# Patient Record
Sex: Male | Born: 1955 | Race: White | Hispanic: No | Marital: Single | Smoking: Current every day smoker
Health system: Southern US, Community
[De-identification: ages and names within clinical notes are randomized; demographics above are authoritative.]

## PROBLEM LIST (undated history)

## (undated) ENCOUNTER — Emergency Department (HOSPITAL_COMMUNITY): Admission: EM | Discharge status: Against Medical Advice | Payer: Medicare Other

## (undated) ENCOUNTER — Emergency Department (HOSPITAL_COMMUNITY): Admission: EM | Payer: Medicare Other | Source: Home / Self Care

## (undated) DIAGNOSIS — R569 Unspecified convulsions: Secondary | ICD-10-CM

## (undated) DIAGNOSIS — I2510 Atherosclerotic heart disease of native coronary artery without angina pectoris: Secondary | ICD-10-CM

## (undated) DIAGNOSIS — F419 Anxiety disorder, unspecified: Secondary | ICD-10-CM

## (undated) DIAGNOSIS — F411 Generalized anxiety disorder: Secondary | ICD-10-CM

## (undated) DIAGNOSIS — G8929 Other chronic pain: Secondary | ICD-10-CM

## (undated) DIAGNOSIS — F6381 Intermittent explosive disorder: Secondary | ICD-10-CM

## (undated) DIAGNOSIS — E079 Disorder of thyroid, unspecified: Secondary | ICD-10-CM

## (undated) DIAGNOSIS — K279 Peptic ulcer, site unspecified, unspecified as acute or chronic, without hemorrhage or perforation: Secondary | ICD-10-CM

## (undated) DIAGNOSIS — J449 Chronic obstructive pulmonary disease, unspecified: Secondary | ICD-10-CM

## (undated) DIAGNOSIS — R079 Chest pain, unspecified: Secondary | ICD-10-CM

## (undated) DIAGNOSIS — C801 Malignant (primary) neoplasm, unspecified: Secondary | ICD-10-CM

## (undated) DIAGNOSIS — N401 Enlarged prostate with lower urinary tract symptoms: Secondary | ICD-10-CM

## (undated) DIAGNOSIS — F99 Mental disorder, not otherwise specified: Secondary | ICD-10-CM

## (undated) DIAGNOSIS — H5440 Blindness, one eye, unspecified eye: Secondary | ICD-10-CM

## (undated) DIAGNOSIS — I10 Essential (primary) hypertension: Secondary | ICD-10-CM

## (undated) DIAGNOSIS — J9610 Chronic respiratory failure, unspecified whether with hypoxia or hypercapnia: Secondary | ICD-10-CM

## (undated) DIAGNOSIS — N138 Other obstructive and reflux uropathy: Secondary | ICD-10-CM

## (undated) DIAGNOSIS — F0630 Mood disorder due to known physiological condition, unspecified: Secondary | ICD-10-CM

## (undated) DIAGNOSIS — I5030 Unspecified diastolic (congestive) heart failure: Secondary | ICD-10-CM

## (undated) DIAGNOSIS — I219 Acute myocardial infarction, unspecified: Secondary | ICD-10-CM

## (undated) DIAGNOSIS — R69 Illness, unspecified: Secondary | ICD-10-CM

## (undated) DIAGNOSIS — F70 Mild intellectual disabilities: Secondary | ICD-10-CM

## (undated) HISTORY — PX: FRACTURE SURGERY: SHX138

---

## 1998-01-19 ENCOUNTER — Emergency Department (HOSPITAL_COMMUNITY): Admission: EM | Admit: 1998-01-19 | Discharge: 1998-01-19 | Payer: Self-pay | Admitting: Emergency Medicine

## 1998-02-11 ENCOUNTER — Emergency Department (HOSPITAL_COMMUNITY): Admission: EM | Admit: 1998-02-11 | Discharge: 1998-02-11 | Payer: Self-pay | Admitting: Emergency Medicine

## 1998-03-19 ENCOUNTER — Inpatient Hospital Stay (HOSPITAL_COMMUNITY): Admission: AD | Admit: 1998-03-19 | Discharge: 1998-03-22 | Payer: Self-pay | Admitting: *Deleted

## 1998-05-17 ENCOUNTER — Emergency Department (HOSPITAL_COMMUNITY): Admission: EM | Admit: 1998-05-17 | Discharge: 1998-05-17 | Payer: Self-pay | Admitting: Emergency Medicine

## 1998-05-25 ENCOUNTER — Emergency Department (HOSPITAL_COMMUNITY): Admission: EM | Admit: 1998-05-25 | Discharge: 1998-05-25 | Payer: Self-pay | Admitting: Emergency Medicine

## 1998-06-13 ENCOUNTER — Ambulatory Visit: Admission: RE | Admit: 1998-06-13 | Discharge: 1998-06-13 | Payer: Self-pay | Admitting: Internal Medicine

## 1998-06-15 ENCOUNTER — Emergency Department (HOSPITAL_COMMUNITY): Admission: EM | Admit: 1998-06-15 | Discharge: 1998-06-15 | Payer: Self-pay | Admitting: Emergency Medicine

## 1998-06-19 ENCOUNTER — Encounter: Payer: Self-pay | Admitting: *Deleted

## 1998-06-20 ENCOUNTER — Inpatient Hospital Stay (HOSPITAL_COMMUNITY): Admission: EM | Admit: 1998-06-20 | Discharge: 1998-06-21 | Payer: Self-pay | Admitting: *Deleted

## 1998-07-09 ENCOUNTER — Encounter: Payer: Self-pay | Admitting: Emergency Medicine

## 1998-07-09 ENCOUNTER — Emergency Department (HOSPITAL_COMMUNITY): Admission: EM | Admit: 1998-07-09 | Discharge: 1998-07-09 | Payer: Self-pay | Admitting: Emergency Medicine

## 1998-08-12 ENCOUNTER — Emergency Department (HOSPITAL_COMMUNITY): Admission: EM | Admit: 1998-08-12 | Discharge: 1998-08-12 | Payer: Self-pay | Admitting: Emergency Medicine

## 1998-08-17 ENCOUNTER — Emergency Department (HOSPITAL_COMMUNITY): Admission: EM | Admit: 1998-08-17 | Discharge: 1998-08-17 | Payer: Self-pay | Admitting: Emergency Medicine

## 1998-08-22 ENCOUNTER — Emergency Department (HOSPITAL_COMMUNITY): Admission: EM | Admit: 1998-08-22 | Discharge: 1998-08-22 | Payer: Self-pay | Admitting: Emergency Medicine

## 1998-08-22 ENCOUNTER — Encounter: Payer: Self-pay | Admitting: Emergency Medicine

## 1998-08-28 ENCOUNTER — Emergency Department (HOSPITAL_COMMUNITY): Admission: EM | Admit: 1998-08-28 | Discharge: 1998-08-28 | Payer: Self-pay | Admitting: Emergency Medicine

## 1998-10-05 ENCOUNTER — Emergency Department (HOSPITAL_COMMUNITY): Admission: EM | Admit: 1998-10-05 | Discharge: 1998-10-05 | Payer: Self-pay | Admitting: Emergency Medicine

## 1998-11-15 ENCOUNTER — Emergency Department (HOSPITAL_COMMUNITY): Admission: EM | Admit: 1998-11-15 | Discharge: 1998-11-15 | Payer: Self-pay | Admitting: Emergency Medicine

## 1998-11-15 ENCOUNTER — Encounter: Payer: Self-pay | Admitting: Emergency Medicine

## 1998-12-31 ENCOUNTER — Emergency Department (HOSPITAL_COMMUNITY): Admission: EM | Admit: 1998-12-31 | Discharge: 1998-12-31 | Payer: Self-pay | Admitting: Emergency Medicine

## 1999-01-08 ENCOUNTER — Emergency Department (HOSPITAL_COMMUNITY): Admission: EM | Admit: 1999-01-08 | Discharge: 1999-01-08 | Payer: Self-pay | Admitting: Emergency Medicine

## 1999-01-09 ENCOUNTER — Emergency Department (HOSPITAL_COMMUNITY): Admission: EM | Admit: 1999-01-09 | Discharge: 1999-01-09 | Payer: Self-pay | Admitting: Emergency Medicine

## 1999-02-09 ENCOUNTER — Inpatient Hospital Stay (HOSPITAL_COMMUNITY): Admission: EM | Admit: 1999-02-09 | Discharge: 1999-02-10 | Payer: Self-pay | Admitting: Emergency Medicine

## 1999-02-09 ENCOUNTER — Encounter: Payer: Self-pay | Admitting: Emergency Medicine

## 1999-02-10 ENCOUNTER — Encounter: Payer: Self-pay | Admitting: Internal Medicine

## 1999-04-22 ENCOUNTER — Emergency Department (HOSPITAL_COMMUNITY): Admission: EM | Admit: 1999-04-22 | Discharge: 1999-04-22 | Payer: Self-pay | Admitting: Emergency Medicine

## 1999-06-17 ENCOUNTER — Encounter: Payer: Self-pay | Admitting: Emergency Medicine

## 1999-06-17 ENCOUNTER — Emergency Department (HOSPITAL_COMMUNITY): Admission: EM | Admit: 1999-06-17 | Discharge: 1999-06-17 | Payer: Self-pay | Admitting: Emergency Medicine

## 1999-07-26 ENCOUNTER — Emergency Department (HOSPITAL_COMMUNITY): Admission: EM | Admit: 1999-07-26 | Discharge: 1999-07-26 | Payer: Self-pay | Admitting: Emergency Medicine

## 1999-09-11 ENCOUNTER — Encounter: Payer: Self-pay | Admitting: Emergency Medicine

## 1999-09-11 ENCOUNTER — Emergency Department (HOSPITAL_COMMUNITY): Admission: EM | Admit: 1999-09-11 | Discharge: 1999-09-11 | Payer: Self-pay | Admitting: Emergency Medicine

## 1999-10-09 ENCOUNTER — Emergency Department (HOSPITAL_COMMUNITY): Admission: EM | Admit: 1999-10-09 | Discharge: 1999-10-09 | Payer: Self-pay | Admitting: Emergency Medicine

## 1999-11-02 ENCOUNTER — Emergency Department (HOSPITAL_COMMUNITY): Admission: EM | Admit: 1999-11-02 | Discharge: 1999-11-02 | Payer: Self-pay

## 2000-01-14 ENCOUNTER — Emergency Department (HOSPITAL_COMMUNITY): Admission: EM | Admit: 2000-01-14 | Discharge: 2000-01-14 | Payer: Self-pay | Admitting: Emergency Medicine

## 2000-01-14 ENCOUNTER — Encounter: Payer: Self-pay | Admitting: Emergency Medicine

## 2000-03-14 ENCOUNTER — Emergency Department (HOSPITAL_COMMUNITY): Admission: EM | Admit: 2000-03-14 | Discharge: 2000-03-15 | Payer: Self-pay | Admitting: Emergency Medicine

## 2000-05-04 ENCOUNTER — Emergency Department (HOSPITAL_COMMUNITY): Admission: EM | Admit: 2000-05-04 | Discharge: 2000-05-04 | Payer: Self-pay | Admitting: Emergency Medicine

## 2000-05-04 ENCOUNTER — Encounter: Payer: Self-pay | Admitting: Emergency Medicine

## 2000-05-21 ENCOUNTER — Encounter: Payer: Self-pay | Admitting: Emergency Medicine

## 2000-05-21 ENCOUNTER — Emergency Department (HOSPITAL_COMMUNITY): Admission: EM | Admit: 2000-05-21 | Discharge: 2000-05-21 | Payer: Self-pay | Admitting: Emergency Medicine

## 2000-06-27 ENCOUNTER — Emergency Department (HOSPITAL_COMMUNITY): Admission: EM | Admit: 2000-06-27 | Discharge: 2000-06-27 | Payer: Self-pay | Admitting: Emergency Medicine

## 2000-07-06 ENCOUNTER — Emergency Department (HOSPITAL_COMMUNITY): Admission: EM | Admit: 2000-07-06 | Discharge: 2000-07-06 | Payer: Self-pay | Admitting: Emergency Medicine

## 2000-07-14 ENCOUNTER — Emergency Department (HOSPITAL_COMMUNITY): Admission: EM | Admit: 2000-07-14 | Discharge: 2000-07-14 | Payer: Self-pay | Admitting: Emergency Medicine

## 2000-08-11 ENCOUNTER — Emergency Department (HOSPITAL_COMMUNITY): Admission: EM | Admit: 2000-08-11 | Discharge: 2000-08-11 | Payer: Self-pay

## 2000-08-26 ENCOUNTER — Encounter: Admission: RE | Admit: 2000-08-26 | Discharge: 2000-11-24 | Payer: Self-pay | Admitting: Neurology

## 2000-09-08 ENCOUNTER — Emergency Department (HOSPITAL_COMMUNITY): Admission: EM | Admit: 2000-09-08 | Discharge: 2000-09-08 | Payer: Self-pay

## 2000-10-16 ENCOUNTER — Ambulatory Visit (HOSPITAL_COMMUNITY): Admission: RE | Admit: 2000-10-16 | Discharge: 2000-10-16 | Payer: Self-pay | Admitting: Gastroenterology

## 2000-10-23 ENCOUNTER — Emergency Department (HOSPITAL_COMMUNITY): Admission: EM | Admit: 2000-10-23 | Discharge: 2000-10-23 | Payer: Self-pay

## 2000-11-21 ENCOUNTER — Encounter: Payer: Self-pay | Admitting: Emergency Medicine

## 2000-11-21 ENCOUNTER — Emergency Department (HOSPITAL_COMMUNITY): Admission: EM | Admit: 2000-11-21 | Discharge: 2000-11-21 | Payer: Self-pay | Admitting: Emergency Medicine

## 2000-12-09 ENCOUNTER — Emergency Department (HOSPITAL_COMMUNITY): Admission: EM | Admit: 2000-12-09 | Discharge: 2000-12-09 | Payer: Self-pay

## 2000-12-29 ENCOUNTER — Emergency Department (HOSPITAL_COMMUNITY): Admission: EM | Admit: 2000-12-29 | Discharge: 2000-12-29 | Payer: Self-pay | Admitting: Emergency Medicine

## 2001-03-04 ENCOUNTER — Emergency Department (HOSPITAL_COMMUNITY): Admission: EM | Admit: 2001-03-04 | Discharge: 2001-03-04 | Payer: Self-pay | Admitting: Emergency Medicine

## 2001-03-15 ENCOUNTER — Encounter: Payer: Self-pay | Admitting: Emergency Medicine

## 2001-03-15 ENCOUNTER — Emergency Department (HOSPITAL_COMMUNITY): Admission: EM | Admit: 2001-03-15 | Discharge: 2001-03-15 | Payer: Self-pay | Admitting: Emergency Medicine

## 2001-04-11 ENCOUNTER — Emergency Department (HOSPITAL_COMMUNITY): Admission: EM | Admit: 2001-04-11 | Discharge: 2001-04-11 | Payer: Self-pay | Admitting: Emergency Medicine

## 2001-04-27 ENCOUNTER — Emergency Department (HOSPITAL_COMMUNITY): Admission: EM | Admit: 2001-04-27 | Discharge: 2001-04-28 | Payer: Self-pay | Admitting: *Deleted

## 2001-05-07 ENCOUNTER — Encounter: Admission: RE | Admit: 2001-05-07 | Discharge: 2001-05-07 | Payer: Self-pay

## 2001-08-02 ENCOUNTER — Emergency Department (HOSPITAL_COMMUNITY): Admission: EM | Admit: 2001-08-02 | Discharge: 2001-08-03 | Payer: Self-pay | Admitting: Emergency Medicine

## 2001-08-02 ENCOUNTER — Encounter: Payer: Self-pay | Admitting: Emergency Medicine

## 2001-08-04 ENCOUNTER — Encounter: Payer: Self-pay | Admitting: Emergency Medicine

## 2001-08-05 ENCOUNTER — Inpatient Hospital Stay (HOSPITAL_COMMUNITY): Admission: EM | Admit: 2001-08-05 | Discharge: 2001-08-08 | Payer: Self-pay | Admitting: Emergency Medicine

## 2001-08-06 ENCOUNTER — Encounter: Payer: Self-pay | Admitting: *Deleted

## 2001-08-31 ENCOUNTER — Emergency Department (HOSPITAL_COMMUNITY): Admission: EM | Admit: 2001-08-31 | Discharge: 2001-08-31 | Payer: Self-pay | Admitting: Emergency Medicine

## 2001-08-31 ENCOUNTER — Encounter: Payer: Self-pay | Admitting: Emergency Medicine

## 2001-09-14 ENCOUNTER — Emergency Department (HOSPITAL_COMMUNITY): Admission: EM | Admit: 2001-09-14 | Discharge: 2001-09-14 | Payer: Self-pay | Admitting: Podiatry

## 2001-10-03 ENCOUNTER — Encounter: Payer: Self-pay | Admitting: Emergency Medicine

## 2001-10-03 ENCOUNTER — Emergency Department (HOSPITAL_COMMUNITY): Admission: EM | Admit: 2001-10-03 | Discharge: 2001-10-03 | Payer: Self-pay | Admitting: Emergency Medicine

## 2001-10-16 ENCOUNTER — Emergency Department (HOSPITAL_COMMUNITY): Admission: EM | Admit: 2001-10-16 | Discharge: 2001-10-17 | Payer: Self-pay | Admitting: Emergency Medicine

## 2001-10-21 ENCOUNTER — Emergency Department (HOSPITAL_COMMUNITY): Admission: EM | Admit: 2001-10-21 | Discharge: 2001-10-21 | Payer: Self-pay

## 2001-10-21 ENCOUNTER — Encounter: Payer: Self-pay | Admitting: Emergency Medicine

## 2001-10-29 ENCOUNTER — Emergency Department (HOSPITAL_COMMUNITY): Admission: EM | Admit: 2001-10-29 | Discharge: 2001-10-29 | Payer: Self-pay | Admitting: Emergency Medicine

## 2001-10-29 ENCOUNTER — Encounter: Payer: Self-pay | Admitting: Emergency Medicine

## 2001-11-02 ENCOUNTER — Encounter: Payer: Self-pay | Admitting: Emergency Medicine

## 2001-11-02 ENCOUNTER — Emergency Department (HOSPITAL_COMMUNITY): Admission: EM | Admit: 2001-11-02 | Discharge: 2001-11-02 | Payer: Self-pay | Admitting: Emergency Medicine

## 2001-11-14 ENCOUNTER — Encounter: Payer: Self-pay | Admitting: Emergency Medicine

## 2001-11-14 ENCOUNTER — Emergency Department (HOSPITAL_COMMUNITY): Admission: EM | Admit: 2001-11-14 | Discharge: 2001-11-15 | Payer: Self-pay | Admitting: Emergency Medicine

## 2001-12-04 ENCOUNTER — Encounter (INDEPENDENT_AMBULATORY_CARE_PROVIDER_SITE_OTHER): Payer: Self-pay | Admitting: Specialist

## 2001-12-04 ENCOUNTER — Ambulatory Visit (HOSPITAL_COMMUNITY): Admission: RE | Admit: 2001-12-04 | Discharge: 2001-12-04 | Payer: Self-pay | Admitting: Gastroenterology

## 2001-12-09 ENCOUNTER — Encounter: Payer: Self-pay | Admitting: Emergency Medicine

## 2001-12-09 ENCOUNTER — Emergency Department (HOSPITAL_COMMUNITY): Admission: EM | Admit: 2001-12-09 | Discharge: 2001-12-09 | Payer: Self-pay | Admitting: Emergency Medicine

## 2002-02-13 ENCOUNTER — Encounter: Payer: Self-pay | Admitting: Emergency Medicine

## 2002-02-13 ENCOUNTER — Emergency Department (HOSPITAL_COMMUNITY): Admission: EM | Admit: 2002-02-13 | Discharge: 2002-02-14 | Payer: Self-pay | Admitting: Emergency Medicine

## 2002-03-05 ENCOUNTER — Emergency Department (HOSPITAL_COMMUNITY): Admission: EM | Admit: 2002-03-05 | Discharge: 2002-03-06 | Payer: Self-pay | Admitting: Emergency Medicine

## 2002-03-06 ENCOUNTER — Encounter: Payer: Self-pay | Admitting: Emergency Medicine

## 2002-03-20 ENCOUNTER — Emergency Department (HOSPITAL_COMMUNITY): Admission: EM | Admit: 2002-03-20 | Discharge: 2002-03-20 | Payer: Self-pay | Admitting: Emergency Medicine

## 2002-04-01 ENCOUNTER — Emergency Department (HOSPITAL_COMMUNITY): Admission: EM | Admit: 2002-04-01 | Discharge: 2002-04-01 | Payer: Self-pay | Admitting: Emergency Medicine

## 2002-06-03 ENCOUNTER — Emergency Department (HOSPITAL_COMMUNITY): Admission: EM | Admit: 2002-06-03 | Discharge: 2002-06-03 | Payer: Self-pay | Admitting: Emergency Medicine

## 2002-06-03 ENCOUNTER — Encounter: Payer: Self-pay | Admitting: Emergency Medicine

## 2002-06-10 ENCOUNTER — Inpatient Hospital Stay (HOSPITAL_COMMUNITY): Admission: EM | Admit: 2002-06-10 | Discharge: 2002-06-15 | Payer: Self-pay | Admitting: Psychiatry

## 2002-06-14 ENCOUNTER — Emergency Department (HOSPITAL_COMMUNITY): Admission: EM | Admit: 2002-06-14 | Discharge: 2002-06-14 | Payer: Self-pay | Admitting: Emergency Medicine

## 2002-06-14 ENCOUNTER — Encounter: Payer: Self-pay | Admitting: Emergency Medicine

## 2002-06-26 ENCOUNTER — Emergency Department (HOSPITAL_COMMUNITY): Admission: EM | Admit: 2002-06-26 | Discharge: 2002-06-27 | Payer: Self-pay | Admitting: Emergency Medicine

## 2002-06-27 ENCOUNTER — Encounter: Payer: Self-pay | Admitting: Emergency Medicine

## 2002-07-27 ENCOUNTER — Emergency Department (HOSPITAL_COMMUNITY): Admission: EM | Admit: 2002-07-27 | Discharge: 2002-07-27 | Payer: Self-pay | Admitting: Emergency Medicine

## 2002-11-15 ENCOUNTER — Emergency Department (HOSPITAL_COMMUNITY): Admission: EM | Admit: 2002-11-15 | Discharge: 2002-11-15 | Payer: Self-pay | Admitting: Emergency Medicine

## 2002-12-29 ENCOUNTER — Emergency Department (HOSPITAL_COMMUNITY): Admission: EM | Admit: 2002-12-29 | Discharge: 2002-12-29 | Payer: Self-pay | Admitting: Emergency Medicine

## 2003-01-04 ENCOUNTER — Emergency Department (HOSPITAL_COMMUNITY): Admission: EM | Admit: 2003-01-04 | Discharge: 2003-01-04 | Payer: Self-pay | Admitting: Emergency Medicine

## 2003-03-28 ENCOUNTER — Emergency Department (HOSPITAL_COMMUNITY): Admission: EM | Admit: 2003-03-28 | Discharge: 2003-03-29 | Payer: Self-pay | Admitting: Emergency Medicine

## 2003-05-26 ENCOUNTER — Emergency Department (HOSPITAL_COMMUNITY): Admission: EM | Admit: 2003-05-26 | Discharge: 2003-05-26 | Payer: Self-pay | Admitting: Emergency Medicine

## 2003-06-15 ENCOUNTER — Emergency Department (HOSPITAL_COMMUNITY): Admission: EM | Admit: 2003-06-15 | Discharge: 2003-06-15 | Payer: Self-pay | Admitting: Emergency Medicine

## 2003-07-31 ENCOUNTER — Emergency Department (HOSPITAL_COMMUNITY): Admission: EM | Admit: 2003-07-31 | Discharge: 2003-07-31 | Payer: Self-pay | Admitting: Emergency Medicine

## 2003-08-12 ENCOUNTER — Encounter: Admission: RE | Admit: 2003-08-12 | Discharge: 2003-08-12 | Payer: Self-pay | Admitting: Internal Medicine

## 2003-11-11 ENCOUNTER — Emergency Department (HOSPITAL_COMMUNITY): Admission: EM | Admit: 2003-11-11 | Discharge: 2003-11-11 | Payer: Self-pay | Admitting: Emergency Medicine

## 2003-11-28 ENCOUNTER — Ambulatory Visit: Payer: Self-pay | Admitting: Psychiatry

## 2003-11-29 ENCOUNTER — Inpatient Hospital Stay (HOSPITAL_COMMUNITY): Admission: EM | Admit: 2003-11-29 | Discharge: 2003-12-04 | Payer: Self-pay | Admitting: Psychiatry

## 2003-12-04 ENCOUNTER — Inpatient Hospital Stay (HOSPITAL_COMMUNITY): Admission: EM | Admit: 2003-12-04 | Discharge: 2003-12-06 | Payer: Self-pay

## 2004-03-25 ENCOUNTER — Emergency Department: Payer: Self-pay | Admitting: Emergency Medicine

## 2004-04-07 ENCOUNTER — Emergency Department (HOSPITAL_COMMUNITY): Admission: EM | Admit: 2004-04-07 | Discharge: 2004-04-08 | Payer: Self-pay | Admitting: Emergency Medicine

## 2004-05-31 ENCOUNTER — Ambulatory Visit (HOSPITAL_COMMUNITY): Admission: RE | Admit: 2004-05-31 | Discharge: 2004-05-31 | Payer: Self-pay | Admitting: Internal Medicine

## 2004-06-03 ENCOUNTER — Inpatient Hospital Stay: Payer: Self-pay | Admitting: Unknown Physician Specialty

## 2005-02-16 ENCOUNTER — Emergency Department (HOSPITAL_COMMUNITY): Admission: EM | Admit: 2005-02-16 | Discharge: 2005-02-17 | Payer: Self-pay | Admitting: Emergency Medicine

## 2005-05-31 ENCOUNTER — Emergency Department: Payer: Self-pay | Admitting: Emergency Medicine

## 2006-06-29 ENCOUNTER — Inpatient Hospital Stay: Payer: Self-pay | Admitting: Unknown Physician Specialty

## 2006-08-06 ENCOUNTER — Emergency Department: Payer: Self-pay | Admitting: Emergency Medicine

## 2007-02-09 ENCOUNTER — Emergency Department: Payer: Self-pay | Admitting: Emergency Medicine

## 2007-04-28 ENCOUNTER — Emergency Department: Payer: Self-pay | Admitting: Emergency Medicine

## 2007-07-28 ENCOUNTER — Emergency Department: Payer: Self-pay | Admitting: Emergency Medicine

## 2007-12-18 ENCOUNTER — Emergency Department: Payer: Self-pay | Admitting: Emergency Medicine

## 2008-01-06 ENCOUNTER — Emergency Department: Payer: Self-pay | Admitting: Emergency Medicine

## 2008-01-08 ENCOUNTER — Inpatient Hospital Stay: Payer: Self-pay | Admitting: Psychiatry

## 2008-04-21 ENCOUNTER — Emergency Department: Payer: Self-pay | Admitting: Emergency Medicine

## 2008-06-20 ENCOUNTER — Emergency Department: Payer: Self-pay | Admitting: Emergency Medicine

## 2009-01-25 ENCOUNTER — Emergency Department (HOSPITAL_BASED_OUTPATIENT_CLINIC_OR_DEPARTMENT_OTHER): Admission: EM | Admit: 2009-01-25 | Discharge: 2009-01-25 | Payer: Self-pay | Admitting: Emergency Medicine

## 2009-01-25 ENCOUNTER — Ambulatory Visit: Payer: Self-pay | Admitting: Diagnostic Radiology

## 2009-05-02 ENCOUNTER — Emergency Department (HOSPITAL_BASED_OUTPATIENT_CLINIC_OR_DEPARTMENT_OTHER): Admission: EM | Admit: 2009-05-02 | Discharge: 2009-05-03 | Payer: Self-pay | Admitting: Emergency Medicine

## 2009-05-02 ENCOUNTER — Ambulatory Visit: Payer: Self-pay | Admitting: Radiology

## 2010-03-02 ENCOUNTER — Emergency Department: Payer: Self-pay | Admitting: Emergency Medicine

## 2010-03-20 LAB — URINALYSIS, ROUTINE W REFLEX MICROSCOPIC
Bilirubin Urine: NEGATIVE
Glucose, UA: NEGATIVE mg/dL
Hgb urine dipstick: NEGATIVE
Ketones, ur: NEGATIVE mg/dL
Nitrite: NEGATIVE
Protein, ur: NEGATIVE mg/dL
Specific Gravity, Urine: 1.009 (ref 1.005–1.030)
Urobilinogen, UA: 1 mg/dL (ref 0.0–1.0)
pH: 7.5 (ref 5.0–8.0)

## 2010-03-20 LAB — ETHANOL: Alcohol, Ethyl (B): <5 mg/dL (ref 0–10)

## 2010-03-20 LAB — DIFFERENTIAL
Basophils Absolute: 0 10*3/uL (ref 0.0–0.1)
Basophils Relative: 1 % (ref 0–1)
Eosinophils Absolute: 0.5 10*3/uL (ref 0.0–0.7)
Eosinophils Relative: 6 % — ABNORMAL HIGH (ref 0–5)
Lymphocytes Relative: 17 % (ref 12–46)
Lymphs Abs: 1.4 10*3/uL (ref 0.7–4.0)
Monocytes Absolute: 0.7 10*3/uL (ref 0.1–1.0)
Monocytes Relative: 9 % (ref 3–12)
Neutro Abs: 5.5 10*3/uL (ref 1.7–7.7)
Neutrophils Relative %: 67 % (ref 43–77)

## 2010-03-20 LAB — COMPREHENSIVE METABOLIC PANEL
ALT: 12 U/L (ref 0–53)
AST: 16 U/L (ref 0–37)
Albumin: 3.7 g/dL (ref 3.5–5.2)
Alkaline Phosphatase: 66 U/L (ref 39–117)
BUN: 9 mg/dL (ref 6–23)
CO2: 31 mEq/L (ref 19–32)
Calcium: 9.4 mg/dL (ref 8.4–10.5)
Chloride: 108 mEq/L (ref 96–112)
Creatinine, Ser: 0.9 mg/dL (ref 0.4–1.5)
GFR calc Af Amer: >60 mL/min (ref >60)
GFR calc non Af Amer: >60 mL/min (ref >60)
Glucose, Bld: 95 mg/dL (ref 70–99)
Potassium: 4.2 mEq/L (ref 3.5–5.1)
Sodium: 145 mEq/L (ref 135–145)
Total Bilirubin: 0.3 mg/dL (ref 0.3–1.2)
Total Protein: 7.3 g/dL (ref 6.0–8.3)

## 2010-03-20 LAB — POCT TOXICOLOGY PANEL

## 2010-03-20 LAB — CBC
HCT: 41.3 % (ref 39.0–52.0)
Hemoglobin: 13.9 g/dL (ref 13.0–17.0)
MCHC: 33.7 g/dL (ref 30.0–36.0)
MCV: 89.8 fL (ref 78.0–100.0)
Platelets: 215 10*3/uL (ref 150–400)
RBC: 4.6 MIL/uL (ref 4.22–5.81)
RDW: 12.6 % (ref 11.5–15.5)
WBC: 8.1 10*3/uL (ref 4.0–10.5)

## 2010-03-20 LAB — VALPROIC ACID LEVEL: Valproic Acid Lvl: <10 ug/mL — ABNORMAL LOW (ref 50.0–100.0)

## 2010-03-20 LAB — LITHIUM LEVEL: Lithium Lvl: 0.51 mEq/L — ABNORMAL LOW (ref 0.80–1.40)

## 2010-03-21 LAB — POCT TOXICOLOGY PANEL

## 2010-03-21 LAB — URINALYSIS, ROUTINE W REFLEX MICROSCOPIC
Glucose, UA: NEGATIVE mg/dL
Ketones, ur: NEGATIVE mg/dL
Protein, ur: NEGATIVE mg/dL

## 2010-03-21 LAB — BASIC METABOLIC PANEL
BUN: 6 mg/dL (ref 6–23)
GFR calc non Af Amer: >60 mL/min (ref >60)
Glucose, Bld: 95 mg/dL (ref 70–99)
Potassium: 3.5 mEq/L (ref 3.5–5.1)

## 2010-03-21 LAB — DIFFERENTIAL
Basophils Absolute: 0 10*3/uL (ref 0.0–0.1)
Basophils Relative: 1 % (ref 0–1)
Eosinophils Absolute: 0.3 10*3/uL (ref 0.0–0.7)
Eosinophils Relative: 4 % (ref 0–5)
Lymphocytes Relative: 21 % (ref 12–46)

## 2010-03-21 LAB — POCT CARDIAC MARKERS
CKMB, poc: <1 ng/mL — ABNORMAL LOW (ref 1.0–8.0)
Myoglobin, poc: 59.3 ng/mL (ref 12–200)
Troponin i, poc: <0.05 ng/mL (ref 0.00–0.09)

## 2010-03-21 LAB — CBC
HCT: 40.2 % (ref 39.0–52.0)
MCV: 89.2 fL (ref 78.0–100.0)
Platelets: 220 10*3/uL (ref 150–400)
RDW: 12.7 % (ref 11.5–15.5)

## 2010-05-19 NOTE — Op Note (Signed)
[name], [name]                          ACCOUNT NO.:  [id number]   MEDICAL RECORD NO.:  [id number]                   PATIENT TYPE:  AMB   LOCATION:  ENDO                                 FACILITY:  [facility]   PHYSICIAN:  Petra Kuba, M.D.                 DATE OF BIRTH:  23-Aug-1955   DATE OF PROCEDURE:  12/04/2001  DATE OF DISCHARGE:                                 OPERATIVE REPORT   PROCEDURE:  Colonoscopy with polypectomy.   INDICATIONS:  Patient with persistent anorectal complaints, bright red blood  per rectum, poor prep on a colonoscopy a year ago where one small polyp was  seen.  Want to repeat colonoscopy to rule out any other missed lesions and  for polypectomy.  Consent was signed after risks, benefits, methods, and  options thoroughly discussed in the past.   MEDICATIONS:  Demerol 50 mg, Versed 5 mg.   DESCRIPTION OF PROCEDURE:  Rectal inspection was pertinent for external  hemorrhoids, small.  Digital exam was negative.  The video colonoscope was  inserted and despite a long, looping colon was able to be advanced to the  cecum.  This did require rolling him on his back and various abdominal  pressures.  On insertion, the prep was only fair.  There was still some  formed stool which could not be suctioned, but lots of other liquid stool  was washed and suctioned.  The cecum was identified by the appendiceal  orifice and the ileocecal valve.  No significant abnormality was seen on  insertion.  The scope was slowly withdrawn.  The cecum, ascending, majority  of the transverse were normal.  In the proximal level of the splenic  flexure, two small polyps were seen.  Each was snared, electrocautery  applied, suctioned through the scope and collected in the trap.  I will have  to check the pictures from my previous colonoscopy, but based on the report  I thought that the polyps seen were more proximal but with an even worse  prep, it would be hard to know for sure whether  we were in the same location  or not.  No other transverse abnormality was seen.  The scope was slowly  withdrawn.  No additional findings were seen as we slowly withdrew back to  the rectum.  Once in the rectum the scope was retroflexed, pertinent for  some internal hemorrhoids with some tears.  Anorectal pull-through confirmed  the hemorrhoids with the tears.  No additional findings were seen.  The  scope was reinserted a short way up the left side of the colon, air was  suctioned, the scope removed.  The patient tolerated the procedure well.  There was no obvious immediate complication.   ENDOSCOPIC DIAGNOSES:  1. Internal-external hemorrhoids with tears, source of bleeding.  2. Two small splenic flexure polyps, snared.  3. Otherwise within normal limits to the cecum;  however, with fair prep,     lesions could have been missed.   PLAN:  1. Await pathology, but probably recheck colonoscopy in three years.  2.     Treat hemorrhoids aggressively with suppositories or creams with 2.5% HC.  3. Happy to see back p.r.n., otherwise I feel Dr. Theresia Lo can manage his     case and refer him to a surgeon for better hemorrhoidal management if     unsuccessful with medicines, and happy to see back p.r.n. as above.                                               Petra Kuba, M.D.    MEM/MEDQ  D:  12/04/2001  T:  12/04/2001  Job:  [id number]   cc:   Vikki Ports, M.D.  [address]  [place]  [place] [address]  Fax: [phone]
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
## 2010-05-19 NOTE — Discharge Summary (Signed)
. [facility]  Patient:    [name], [name]                         MRN: [id number] Adm. Date:  [id number] Disc. Date: [id number] Attending:  Phifer, Harriett Sine Welcome Dictator:   Remer Macho, M.D. CC:         Dr. Tenny Craw                           Discharge Summary  DATE OF BIRTH:  Jun 29, 1955  DISCHARGE DIAGNOSES: 1. Non-cardiac chest pain of unclear etiology. 2. Schizoaffective disorder. 3. History of depression. 4. Mental retardation.  DISCHARGE MEDICATIONS:  Note:  Medication list is per Production designer, theatre/television/film of [facility]. 1. Neurontin 600 mg 2 tablets p.o. every a.m. and 4 tablets p.o. every p.m. 2. Zyprexa 10 mg 1 tablet p.o. every a.m. and 2 tablets p.o. q.h.s. 3. Lithium 300 mg p.o. b.i.d. 4. Levoxyl 0.05 mg p.o. every a.m. 5. Lasix 20 mg p.o. t.i.d. 6. Clonazepam 1 mg p.o. t.i.d. 7. Humibid p.o. a.m.  FOLLOWUP:  The patient is to follow up with Dr. Tenny Craw, his stated primary physician. During his followup, the patient will need evaluation of his blood pressure as e had elevated pressures during his hospital course.  PROCEDURES: 1. Chest x-ray on February 09, 1999.  An AP chest x-ray was performed at admission    which revealed left lower lobe infiltrate versus atelectasis. 2. PA and lateral chest x-ray on February 10, 1999.  These revealed resolution of    left lower lobe infiltrate versus atelectasis with positional changes.  HISTORY OF PRESENT ILLNESS:  [name] is a 55 year old white male with a past medical history significant for mental retardation and schizoaffective disorder who presented with chest pain.  [name] stated that he had had two episodes of chest pain on the day of admission.  He was visiting his father at [facility] when he had substernal chest pain that radiated to his left arm with exertion. The pain was relieved by rest and was not associated with diaphoresis or nausea. He also had some mild dyspnea  that was also relieved by rest.  He denied any prior  episodes similar to this presentation.  His review of systems was otherwise negative.  PHYSICAL EXAMINATION:  VITAL SIGNS:  Temperature 97.7, pulse 68, blood pressure 140/95, and respiratory rate was 22.  Pulse oxygen was 99% on room air.  GENERAL:  The patient was alert and oriented x 4.  No no acute distress. Cooperative and pleasant with noted decreased mental capacity.  NECK:  Supple without mass.  No JVD or thyromegaly.  No lymphadenopathy.  BACK:  His back revealed no spinal or CVA tenderness.  CARDIOVASCULAR:  Regular rate and rhythm.  ______ .  No murmurs, gallops, or rubs.  PULMONARY:  Clear to auscultation bilateral with good air movement.  ABDOMEN:  Soft, nontender, nondistended with positive bowel sounds.  EXTREMITIES:  No edema.  Nontender with 2+ dorsal pedis and posterior tibial bilaterally.  SKIN:  Intact without rash.  MUSCULOSKELETAL:  Revealed good muscle tone and was moving all extremities equally, was significant for kyphosis.  NEUROLOGIC:  His cranial nerves II-XII were grossly intact.  Sensory intact. Strength 5/5 throughout.  Cerebella was intact.  Rapid, alternating movements, finger-to-nose, heel-to-shin, and 2+ DTRs throughout.  ADMISSION LABORATORY DATA:  Revealed a white count of 6.4,  hemoglobin 14, and platelets 298.  MCV 86, ANC of 9.3, BMP was within normal limits with a BUN of  and a creatinine of 0.5, and potassium was 4.0.  Serial CKs revealed CK-MB 470.9. Repeat revealed 32/less than 0.3.  Serial troponin Is were 0.05, repeat less than 0.03.  A lithium level of 0.7.  Chest x-rays were as listed above.  HOSPITAL COURSE: #1 - CHEST PAIN:  The patient had no recurrence of chest pain during his hospital course and required no medications for pain relief.  The patient ruled out for acute MI with negative serial cardiac enzymes and a normal repeat EKG.  Given the low pretest  probability for coronary artery disease, it was deemed that the patient was stable for discharge to his group home.  The patient did have a fasting lipid panel, which was pending at the time of discharge.  #2 - SCHIZOAFFECTIVE DISORDER:  The patient reports that he sees Dr. ______ with [facility].  The patient is to follow up with him as needed.   #3 - DISPOSITION:  The patient was to return to his group home.  Lowanda Foster  with social work arranged for his return to the group home, and I signed his FL3. I also made contact with the group home director and instructed him that [name] needed to follow up with his primary physician, Dr. Tenny Craw. DD:  02/10/99 TD:  02/12/99 Job: [id number]

## 2010-05-19 NOTE — Consult Note (Signed)
[name], [name] NO.:  [id number]   MEDICAL RECORD NO.:  [id number]          PATIENT TYPE:  INP   LOCATION:  0350                         FACILITY:  [facility]   PHYSICIAN:  Melvyn Novas, M.D.  DATE OF BIRTH:  [birth year]   DATE OF CONSULTATION:  12/04/2003  DATE OF DISCHARGE:                                   CONSULTATION   The patient is a 55 year old, Caucasian, right-handed male with a history of  paranoid schizophrenia and has apparently spent most of his life in  psychiatric institutions and has an extensive psychiatric history.  There is  a question of whether the patient suffered a seizure with about 30 second  staring attacks during an evaluation over at the [facility].  When he was transferred to the ER at [facility], another staring  event was noted.  Some tremor and shivering was also seen but no convulsive  or tonoclonic activity and no true posticum was noted.   The patient has no history of seizures and can state this to me.   CURRENT MEDICATIONS:  1.  Neurontin 600 mg q.i.d.  2.  Lithium 6.5 mg b.i.d.  3.  Seroquel 25 mg t.i.d.  4.  Trazodone 100/125.  5.  Haldol 10, 5, and 10 mg.  6.  Paxil 37.5 daily.  7.  Klonopin 0.5 mg t.i.d.  8.  The patient also states that he takes Sudafed daily which could be a      seizure threshold lowering medication for him.   NEUROLOGIC EVALUATION:  Mental status:  The patient stares at baseline and  has abrupt movements.  When walking, he appears stiff, and his trunk is  slightly bent over and has some bizarre mannerisms and is otherwise that of  an alert but not fully oriented gentleman, who can give me some detail of  his past medical history but seems to have no insight at baseline and has  psychiatric problems.  Cranial nerve evaluation shows verified eye blink,  decreased mimic, and facial expression, a mild uvula tremor, no  fasciculations, hyper__________ and __________ eye  movement disturbance.  Motor examination shows increased tone with cogwheeling in both elbows and  mild action tremor upon finger-to-nose test.  There is no asymmetry.  Deep  tendon reflexes are 1-2+, downgoing toes.  There is no clonus.   Sensory examination shows primary modalities to be intact as far as the  patient can cooperate.   ASSESSMENT:  The patient shows delayed effects of neuroleptic treatment.  He  shows no focal neurologic deficits.  My plan would be to do an EEG  outpatient which can be done at [facility] depending on his location at the  time  on Monday, and I would suggest to decrease decongestants or better even  discontinue the medication, as it could be a seizure threshold lowering  agent.  I see no reason to reduce or change any of his psychiatric  medications at this point.      CD/MEDQ  D:  12/04/2003  T:  12/05/2003  Job:  [id number]  cc:   Sherin Quarry, MD

## 2010-05-19 NOTE — H&P (Signed)
NAMEKENNER, LEWAN                          ACCOUNT NO.:  [id number]   MEDICAL RECORD NO.:  [id number]                   PATIENT TYPE:  IPS   LOCATION:  0401                                 [facility]ACILITY[facility]  [facility]   PHYSICIAN:  Jeanice Lim, M.D.              DATE OF BIRTH:  04-18-1955   DATE OF ADMISSION:  06/10/2002  DATE OF DISCHARGE:  06/15/2002                         PSYCHIATRIC ADMISSION ASSESSMENT   IDENTIFYING INFORMATION:  This is a 55 year old single white male who was  involuntarily committed on June 10, 2002.   HISTORY OF PRESENT ILLNESS:  The patient presents with a history of  schizophrenia, paranoid-type, with papers stating that patient was making  threats to harm himself with a knife.  He became agitated and anxious,  wanted to jump out in front of traffic.  Also stated that patient scratches  himself and he is anxious.  He, himself, states that he has been compliant  with his medication.  Lives in a group home and denies any significant  stressors to his thoughts of self-harm.  Reports some paranoid ideation  which is obtained from the records.  The patient does not offer any further  information due to patient's sedation.   PAST PSYCHIATRIC HISTORY:  First admission to [facility].  Was  hospitalized at [facility] in 2000 and in [facility] in [place] in 1999.   SOCIAL HISTORY:  This is a 55 year old single white male that lives in a  group home.  It is stated that patient is illiterate.   FAMILY HISTORY:  Unclear.   ALCOHOL/DRUG HISTORY:  The patient smokes and there is no apparent alcohol  or drug use.   PRIMARY CARE PHYSICIAN:  Unknown.   MEDICAL PROBLEMS:  Hypertension and COPD.   MEDICATIONS:  Has been on lithium 300 mg, 2 b.i.d., Paxil CR 37.5 mg q.d.,  Neurontin 400 mg q.i.d., trazodone 150 mg q.h.s., Levoxyl 50 mcg, Kay Ciel  20 mEq q.d., Cozaar 50 mg q.d. and Nasonex 2 sprays each nostril q.d.   ALLERGIES:  THORAZINE  and PROLIXIN.   REVIEW OF SYSTEMS:  A hole in the heart.  PULMONARY:  The patient smokes and  has a history of COPD with some report of using oxygen at group home.  History of hypertension.  No hematological, GI or GU problems.  Wears  glasses.  No hearing loss.  Some stiffness and pain in his knees.   PHYSICAL EXAMINATION:  VITAL SIGNS:  Temperature 97.6, heart rate 63,  respirations 32, blood pressure 144/96.  The patient is 5 feet 10 inches  tall.  He is 232 pounds.  GENERAL APPEARANCE:  The patient is a middle-aged overweight male, somewhat  unkempt.  No acute distress.  Looking downcast and with some mild anxiety.  HEENT:  His head is normocephalic.  Hair is brownish in color.  Has some  flakiness noted.  NECK:  Negative  lymphadenopathy.  CHEST:  Clear to auscultation.  HEART:  Regular rate and rhythm.  Unable to detect murmur or gallop.  ABDOMEN:  Soft, nontender.  MUSCULOSKELETAL:  Movements are symmetrical.   LABORATORY DATA:  CBC was within normal limits.  CMET showed potassium of  3.3.  EKG was done and reading from machine says sinus bradycardia;  otherwise normal EKG.   MENTAL STATUS EXAM:  At time of interview, patient was in the bed and very  sleepy.  Tried to answer but was quite sedated.  His speech was clear.  Unable to ascertain thought processes and cognitive function due to  sedation.   DIAGNOSES:   AXIS I:  Paranoid schizophrenia.   AXIS II:  Mental retardation per chart records.   AXIS III:  1. Hypertension.  2. Chronic obstructive pulmonary disease.   AXIS IV:  Other psychosocial problems and medical problems.   AXIS V:  Current 25; this past year 27.   PLAN:  Involuntary commitment for psychotic symptoms and threatening  behavior.  Contract for safety.  Check every 15 minutes.  The patient will  be placed on the [address].  Will resume his medications.  Stabilize mood and  thinking so patient and others can be safe.  Will monitor vital signs.   Caseworker will look at living arrangements and for any concerns from the  group home.  Will monitor patient's respiratory status.  The patient is to  return to group home if patient willing.  We will initially decrease  patient's Zyprexa as patient was sedated this morning and continue to  monitor patient's psychotic symptoms.     Landry Corporal, N.P.                       Jeanice Lim, M.D.    JO/MEDQ  D:  07/01/2002  T:  07/01/2002  Job:  [id number]
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
## 2010-07-25 ENCOUNTER — Inpatient Hospital Stay: Payer: Self-pay | Admitting: Internal Medicine

## 2010-09-03 ENCOUNTER — Inpatient Hospital Stay: Payer: Self-pay | Admitting: Internal Medicine

## 2010-10-01 ENCOUNTER — Emergency Department (HOSPITAL_COMMUNITY): Payer: Medicare Other

## 2010-10-01 ENCOUNTER — Emergency Department (HOSPITAL_COMMUNITY)
Admission: EM | Admit: 2010-10-01 | Discharge: 2010-10-01 | Disposition: A | Discharge status: Home / Self Care | Payer: Medicare Other | Attending: Emergency Medicine | Admitting: Emergency Medicine

## 2010-10-01 DIAGNOSIS — Z79899 Other long term (current) drug therapy: Secondary | ICD-10-CM | POA: Insufficient documentation

## 2010-10-01 DIAGNOSIS — R0602 Shortness of breath: Secondary | ICD-10-CM | POA: Insufficient documentation

## 2010-10-01 DIAGNOSIS — R059 Cough, unspecified: Secondary | ICD-10-CM | POA: Insufficient documentation

## 2010-10-01 DIAGNOSIS — J189 Pneumonia, unspecified organism: Secondary | ICD-10-CM | POA: Insufficient documentation

## 2010-10-01 DIAGNOSIS — R0609 Other forms of dyspnea: Secondary | ICD-10-CM | POA: Insufficient documentation

## 2010-10-01 DIAGNOSIS — I10 Essential (primary) hypertension: Secondary | ICD-10-CM | POA: Insufficient documentation

## 2010-10-01 DIAGNOSIS — J449 Chronic obstructive pulmonary disease, unspecified: Secondary | ICD-10-CM | POA: Insufficient documentation

## 2010-10-01 DIAGNOSIS — R05 Cough: Secondary | ICD-10-CM | POA: Insufficient documentation

## 2010-10-01 DIAGNOSIS — J4489 Other specified chronic obstructive pulmonary disease: Secondary | ICD-10-CM | POA: Insufficient documentation

## 2010-10-01 DIAGNOSIS — R0989 Other specified symptoms and signs involving the circulatory and respiratory systems: Secondary | ICD-10-CM | POA: Insufficient documentation

## 2010-10-01 LAB — CBC
HCT: 36.4 % — ABNORMAL LOW (ref 39.0–52.0)
Hemoglobin: 12.2 g/dL — ABNORMAL LOW (ref 13.0–17.0)
RDW: 12.3 % (ref 11.5–15.5)
WBC: 12 10*3/uL — ABNORMAL HIGH (ref 4.0–10.5)

## 2010-10-01 LAB — DIFFERENTIAL
Basophils Absolute: 0 10*3/uL (ref 0.0–0.1)
Basophils Relative: 0 % (ref 0–1)
Lymphocytes Relative: 10 % — ABNORMAL LOW (ref 12–46)
Neutro Abs: 9.9 10*3/uL — ABNORMAL HIGH (ref 1.7–7.7)

## 2010-10-01 LAB — BASIC METABOLIC PANEL
BUN: 22 mg/dL (ref 6–23)
Chloride: 99 mEq/L (ref 96–112)
GFR calc Af Amer: >60 mL/min (ref >60)
Potassium: 3.9 mEq/L (ref 3.5–5.1)
Sodium: 132 mEq/L — ABNORMAL LOW (ref 135–145)

## 2010-10-01 LAB — POCT I-STAT TROPONIN I: Troponin i, poc: 0 ng/mL (ref 0.00–0.08)

## 2010-10-03 ENCOUNTER — Inpatient Hospital Stay (INDEPENDENT_AMBULATORY_CARE_PROVIDER_SITE_OTHER)
Admission: RE | Admit: 2010-10-03 | Discharge: 2010-10-03 | Disposition: A | Discharge status: Home / Self Care | Payer: Medicare Other | Source: Ambulatory Visit | Attending: Family Medicine | Admitting: Family Medicine

## 2010-10-03 DIAGNOSIS — S060X0A Concussion without loss of consciousness, initial encounter: Secondary | ICD-10-CM

## 2010-10-03 DIAGNOSIS — S0003XA Contusion of scalp, initial encounter: Secondary | ICD-10-CM

## 2010-10-06 ENCOUNTER — Inpatient Hospital Stay (HOSPITAL_COMMUNITY)
Admission: EM | Admit: 2010-10-06 | Discharge: 2010-10-16 | DRG: 177 | Disposition: A | Discharge status: Skilled Nursing Facility | Payer: Medicare Other | Attending: Internal Medicine | Admitting: Internal Medicine

## 2010-10-06 ENCOUNTER — Emergency Department (HOSPITAL_COMMUNITY): Payer: Medicare Other

## 2010-10-06 DIAGNOSIS — R1310 Dysphagia, unspecified: Secondary | ICD-10-CM | POA: Diagnosis present

## 2010-10-06 DIAGNOSIS — F17200 Nicotine dependence, unspecified, uncomplicated: Secondary | ICD-10-CM | POA: Diagnosis present

## 2010-10-06 DIAGNOSIS — J9600 Acute respiratory failure, unspecified whether with hypoxia or hypercapnia: Secondary | ICD-10-CM | POA: Diagnosis present

## 2010-10-06 DIAGNOSIS — J690 Pneumonitis due to inhalation of food and vomit: Principal | ICD-10-CM | POA: Diagnosis present

## 2010-10-06 DIAGNOSIS — F209 Schizophrenia, unspecified: Secondary | ICD-10-CM | POA: Diagnosis present

## 2010-10-06 DIAGNOSIS — E871 Hypo-osmolality and hyponatremia: Secondary | ICD-10-CM | POA: Diagnosis present

## 2010-10-06 DIAGNOSIS — J441 Chronic obstructive pulmonary disease with (acute) exacerbation: Secondary | ICD-10-CM | POA: Diagnosis present

## 2010-10-06 DIAGNOSIS — I10 Essential (primary) hypertension: Secondary | ICD-10-CM | POA: Diagnosis present

## 2010-10-06 DIAGNOSIS — IMO0002 Reserved for concepts with insufficient information to code with codable children: Secondary | ICD-10-CM

## 2010-10-06 DIAGNOSIS — Z79899 Other long term (current) drug therapy: Secondary | ICD-10-CM

## 2010-10-06 DIAGNOSIS — R0902 Hypoxemia: Secondary | ICD-10-CM | POA: Diagnosis present

## 2010-10-06 DIAGNOSIS — F319 Bipolar disorder, unspecified: Secondary | ICD-10-CM | POA: Diagnosis present

## 2010-10-06 DIAGNOSIS — F79 Unspecified intellectual disabilities: Secondary | ICD-10-CM | POA: Diagnosis present

## 2010-10-06 DIAGNOSIS — E039 Hypothyroidism, unspecified: Secondary | ICD-10-CM | POA: Diagnosis present

## 2010-10-06 LAB — DIFFERENTIAL
Basophils Absolute: 0 10*3/uL (ref 0.0–0.1)
Basophils Relative: 0 % (ref 0–1)
Eosinophils Absolute: 0 K/uL (ref 0.0–0.7)
Eosinophils Relative: 0 % (ref 0–5)
Lymphocytes Relative: 4 % — ABNORMAL LOW (ref 12–46)
Lymphs Abs: 0.7 10*3/uL (ref 0.7–4.0)
Monocytes Absolute: 1.7 K/uL — ABNORMAL HIGH (ref 0.1–1.0)
Monocytes Relative: 9 % (ref 3–12)
Neutro Abs: 16.3 K/uL — ABNORMAL HIGH (ref 1.7–7.7)
Neutrophils Relative %: 87 % — ABNORMAL HIGH (ref 43–77)

## 2010-10-06 LAB — BASIC METABOLIC PANEL WITH GFR
BUN: 17 mg/dL (ref 6–23)
Calcium: 9.2 mg/dL (ref 8.4–10.5)
Creatinine, Ser: 1.21 mg/dL (ref 0.50–1.35)
GFR calc Af Amer: 76 mL/min — ABNORMAL LOW (ref >90)
GFR calc non Af Amer: 65 mL/min — ABNORMAL LOW (ref >90)

## 2010-10-06 LAB — CBC
HCT: 31.1 % — ABNORMAL LOW (ref 39.0–52.0)
Hemoglobin: 10.1 g/dL — ABNORMAL LOW (ref 13.0–17.0)
MCH: 29.4 pg (ref 26.0–34.0)
MCHC: 32.5 g/dL (ref 30.0–36.0)
MCV: 90.7 fL (ref 78.0–100.0)
Platelets: 334 10*3/uL (ref 150–400)
RBC: 3.43 MIL/uL — ABNORMAL LOW (ref 4.22–5.81)
RDW: 12.5 % (ref 11.5–15.5)
WBC: 18.7 10*3/uL — ABNORMAL HIGH (ref 4.0–10.5)

## 2010-10-06 LAB — BASIC METABOLIC PANEL
CO2: 23 mEq/L (ref 19–32)
Chloride: 104 mEq/L (ref 96–112)
Glucose, Bld: 119 mg/dL — ABNORMAL HIGH (ref 70–99)
Potassium: 4.2 mEq/L (ref 3.5–5.1)
Sodium: 134 mEq/L — ABNORMAL LOW (ref 135–145)

## 2010-10-06 LAB — LACTIC ACID, PLASMA: Lactic Acid, Venous: 1.4 mmol/L (ref 0.5–2.2)

## 2010-10-07 LAB — CBC
HCT: 33.2 % — ABNORMAL LOW (ref 39.0–52.0)
Hemoglobin: 10.7 g/dL — ABNORMAL LOW (ref 13.0–17.0)
MCV: 92.2 fL (ref 78.0–100.0)
RBC: 3.6 MIL/uL — ABNORMAL LOW (ref 4.22–5.81)
RDW: 12.5 % (ref 11.5–15.5)
WBC: 16.6 10*3/uL — ABNORMAL HIGH (ref 4.0–10.5)

## 2010-10-07 LAB — BASIC METABOLIC PANEL
BUN: 21 mg/dL (ref 6–23)
CO2: 22 mEq/L (ref 19–32)
Chloride: 106 mEq/L (ref 96–112)
Creatinine, Ser: 0.97 mg/dL (ref 0.50–1.35)
GFR calc Af Amer: >90 mL/min (ref >90)
Potassium: 4.2 mEq/L (ref 3.5–5.1)

## 2010-10-08 LAB — GLUCOSE, CAPILLARY

## 2010-10-10 LAB — CBC
HCT: 30.8 % — ABNORMAL LOW (ref 39.0–52.0)
MCV: 90.6 fL (ref 78.0–100.0)
RDW: 12.2 % (ref 11.5–15.5)
WBC: 13.2 10*3/uL — ABNORMAL HIGH (ref 4.0–10.5)

## 2010-10-10 LAB — BASIC METABOLIC PANEL
BUN: 8 mg/dL (ref 6–23)
Chloride: 108 mEq/L (ref 96–112)
Creatinine, Ser: 0.8 mg/dL (ref 0.50–1.35)
GFR calc Af Amer: >90 mL/min (ref >90)
Glucose, Bld: 89 mg/dL (ref 70–99)

## 2010-10-10 NOTE — H&P (Signed)
NAMEDEKLIN, BIELER NO.:  [id number]  MEDICAL RECORD NO.:  [id number]  LOCATION:  [facility]                         FACILITY:  [facility]  PHYSICIAN:  Lonia Blood, M.D.       DATE OF BIRTH:  Jul 31, 1955  DATE OF ADMISSION:  10/06/2010 DATE OF DISCHARGE:                             HISTORY & PHYSICAL   PRIMARY CARE PHYSICIAN:  Dr. Delfino Lovett -- the patient is unassigned for [facility] admissions.  CHIEF COMPLAINT:  Coughing and short of breath.  HISTORY OF PRESENT ILLNESS:  [name] is a 55 year old gentleman with schizophrenia residing in a group home, who was brought into the emergency room today by staff at the group home because of complaints of increased lethargy, decreased oral intake, increased coughing, and just appearing sick.  Mr. Olafson had a visit to the emergency room on October 03, 2010, and he was diagnosed with pneumonia and prescribed oral Zithromax.  He continued to do poorly with increased shortness of breath, coughing, decreased appetite, and today, he was brought back.  PAST MEDICAL HISTORY:  Schizophrenia, hypertension, mental retardation.  HOME MEDICATIONS:  Fluoxetine, Synthroid, Cozaar, Combivent, Advair, omeprazole, lithium, Zyprexa, and trazodone.  ALLERGIES:  THORAZINE AND PROLIXIN.  SOCIAL HISTORY:  The patient resides in a group home.  He does not have any family.  He has a legal guardian by the name of Kallie Locks, phone number [phone].  He smokes a pack of cigarettes a day.  He is illiterate.  Does not drink alcohol.  FAMILY HISTORY:  Unobtainable.  REVIEW OF SYSTEMS:  As per HPI, otherwise the patient denies any pain. He is currently not actively psychotic.  PHYSICAL EXAM:  Upon admission, VITAL SIGNS:  Temperature 98.7, heart rate 100, blood pressure 98/50, oxygen saturation 86% on room air, 93% on 2 L of oxygen. GENERAL:  The patient is alert.  He is oriented to himself.  He is  calm, cooperative. HEENT:  Head, bitemporal muscle atrophy.  A small laceration around right eyebrow.  Throat, clear. NECK:  Supple.  No JVD. CHEST:  Bilateral crackles. HEART:  Regular rate and rhythm without murmurs, rubs, or gallops. ABDOMEN:  Soft and nontender.  Bowel sounds are present. EXTREMITIES:  Lower extremity without edema. SKIN:  Warm, dry.  No suspicious looking rashes.  LABORATORY VALUES:  On admission, white blood cell count 18,000, hemoglobin 10.1, platelet count is 334, lactic acid 1.4.  BMET:  Sodium 134, potassium 4.2, chloride 104, bicarbonate 22, BUN 17, creatinine 1.2, glucose 119.  I have personally reviewed the patient's chest x-ray two views and I have noticed has chronic changes of emphysema, but bilateral infiltrates, left greater than right.  I have personally reviewed the patient's EKG that shows severe left ventricular hypertrophy, normal sinus rhythm, no ST-T changes.  ASSESSMENT/PLAN:  This is a 55 year old gentleman who is getting admitted for what appears to be a bilateral pneumonia with chronic obstructive pulmonary disease exacerbation with hypoxemic respiratory failure and some mild hypotension.  He is currently not septic, but he clearly has the risk of becoming septic.  He also carries a high-risk of developing worsening respiratory failure and further decline.  I  have also personally observed the patient drinking water with a straw and he immediately coughed and choked suggesting that he may have aspiration. I have also noted that the patient has hyponatremia, which is an indicative for severity of pneumonia.  So the plan for Mr. Malachi is to place him in the acute care units of [facility], start him on intravenous antibiotics using Rocephin, Zithromax, and Solu-Medrol, supplemental oxygen.  The patient has known chronic schizophrenia for which I am going to continue his Zyprexa.  He also carries a history of bipolar disorder for  which I am going to continue his lithium.  The patient also probably has history of hypothyroidism, because he is on Synthroid 75 mcg daily.  The patient also smoke cigarettes for which we will then prescribe a nicotine patch.     Lonia Blood, M.D.     SL/MEDQ  D:  10/06/2010  T:  10/06/2010  Job:  [id number]  Electronically Signed by Lonia Blood M.D. on 10/10/2010 91:47:82 PM

## 2010-10-11 ENCOUNTER — Inpatient Hospital Stay (HOSPITAL_COMMUNITY): Payer: Medicare Other

## 2010-10-12 ENCOUNTER — Inpatient Hospital Stay (HOSPITAL_COMMUNITY): Payer: Medicare Other

## 2010-10-12 LAB — CBC
HCT: 29.9 % — ABNORMAL LOW (ref 39.0–52.0)
MCH: 29.3 pg (ref 26.0–34.0)
MCHC: 32.1 g/dL (ref 30.0–36.0)
MCV: 91.2 fL (ref 78.0–100.0)
RDW: 12.6 % (ref 11.5–15.5)

## 2010-10-13 LAB — CBC
MCH: 29.2 pg (ref 26.0–34.0)
MCHC: 31.5 g/dL (ref 30.0–36.0)
Platelets: 485 10*3/uL — ABNORMAL HIGH (ref 150–400)
RBC: 3.63 MIL/uL — ABNORMAL LOW (ref 4.22–5.81)
RDW: 12.8 % (ref 11.5–15.5)

## 2010-10-13 LAB — CULTURE, BLOOD (ROUTINE X 2)
Culture  Setup Time: 201210060526
Culture: NO GROWTH

## 2010-10-13 LAB — BASIC METABOLIC PANEL
Calcium: 9.7 mg/dL (ref 8.4–10.5)
Creatinine, Ser: 0.82 mg/dL (ref 0.50–1.35)
GFR calc non Af Amer: >90 mL/min (ref >90)
Sodium: 144 mEq/L (ref 135–145)

## 2010-10-24 NOTE — Discharge Summary (Signed)
[name], [name] NO.:  [id number]  MEDICAL RECORD NO.:  [id number]  LOCATION:  5531                         FACILITY:  [facility]  PHYSICIAN:  Thad Ranger, MD       DATE OF BIRTH:  10-16-1955  DATE OF ADMISSION:  10/06/2010 DATE OF DISCHARGE:  10/16/2010                              DISCHARGE SUMMARY   ADDENDUM:  PRIMARY CARE PHYSICIAN:  Dr. Delfino Lovett.  Please see discharge summary dated October 13, 2010.  DISCHARGE DIAGNOSES: 1. Bilateral pneumonia likely aspiration versus healthcare associated. 2. Chronic obstructive pulmonary disease. 3. Hypertension, possibly steroid related. 4. Schizophrenia, stable. 5. Tobacco abuse. 6. Dysphagia.  PHYSICAL EXAM:  GENERAL:  Today, the patient is alert and oriented.  His demeanor is pleasant and cooperative. VITAL SIGNS:  Temperature 97.7, pulse 56, respirations 18, blood pressure 145/86, O2 sats 94%. HEENT:  Head is atraumatic, normocephalic.  Eyes are anicteric with pupils are equal, round, and reactive to light.  Nose shows no nasal discharge or exterior lesions.  Mouth has moist mucous membranes with good dentition. NECK:  Supple with midline trachea.  No JVD.  No lymphadenopathy. CHEST:  Demonstrates no accessory muscle use.  He has no wheezes or crackles at all to my exam. HEART:  Regular rate and rhythm without obvious murmurs, rubs, or gallops. ABDOMEN:  Very thin, soft, nontender, nondistended with good bowel sounds. EXTREMITIES:  Show no clubbing, cyanosis, or edema. PSYCHIATRIC:  The patient has mental retardation, but he is alert and oriented.  His demeanor is pleasant and cooperative.  His grooming is moderate.  DISCHARGE MEDICATIONS: 1. Metoprolol 12.5 mg b.i.d. 2. Prednisone, this will be tapered.  He will receive 10 mg today and     tomorrow.  He will receive 5 mg on October 18, 2010, and October 19, 2010, and then stop. 3. Thick-it food thickener in all liquids. 4. Advair Diskus  250/50 one puff inhaled twice daily. 5. Albuterol nebulizer 1 nebulization inhaled every 6 hours as needed     for shortness of breath. 6. Combivent inhaler, inhaled twice daily. 7. Detrol LA 4 mg 1 capsule by mouth every morning. 8. Docusate sodium 100 mg 1 tablet by mouth twice daily. 9. Epinephrine 0.3 mg injection subcutaneously p.r.n. allergic     reaction. 10.Fluoxetine 40 mg 1 capsule by mouth every morning. 11.Levothyroxine 75 mcg 1 tablet by mouth every morning. 12.Lithium carbonate 300 mg 1 capsule by mouth 3 times a day. 13.Losartan 100 mg 1 tablet by mouth every morning. 14.Meclizine 25 mg 1 tablet by mouth every 4 hours as needed. 15.Nasonex nasal spray one spray nasally daily. 16.Olanzapine ODT 20 mg 1 tablet under the tongue every evening. 17.Omeprazole 20 mg 1 tablet by mouth twice daily. 18.Potassium chloride 20 mL by mouth every morning. 19.ProAir 90 mcg, albuterol inhaler 2 puffs as needed every 4 hours     for shortness of breath. 20.Trazodone 50 mg 3 tablets by mouth daily at bedtime.  DISCHARGE INSTRUCTIONS:  Activity will be per Physical Therapy and Occupational Therapy at skilled nursing facility.  Diet is dysphagia III nectar thick liquids.  I gave all medications in applesauce  or pureed. The patient is on aspiration precautions.  He needs full supervision with when eating diet double portions.  FOLLOWUP APPOINTMENTS:  The patient needs to be seen by primary care physician in 4-7 days.  SUGGESTIONS FOR OUTPATIENT FOLLOWUP:  Please monitor the patient's blood pressure.  He has been started on metoprolol this admission.  This may be re-evaluated over the next 7-10 days, as the patient will be weaned off steroids.     Stephani Police, PA   ______________________________ Thad Ranger, MD    MLY/MEDQ  D:  10/16/2010  T:  10/16/2010  Job:  [id number]  cc:   Dr. Delfino Lovett  Electronically Signed by Algis Downs PA on 10/20/2010 09:07:21  AM Electronically Signed by Andres Labrum RAI  on 10/24/2010 01:37:52 PM

## 2010-10-24 NOTE — Discharge Summary (Signed)
NAMEHOYLE, Phillip NO.:  [id number]  MEDICAL RECORD NO.:  [id number]  LOCATION:  5531                         FACILITY:  [facility]  PHYSICIAN:  Thad Ranger, MD       DATE OF BIRTH:  08/12/1955  DATE OF ADMISSION:  10/06/2010 DATE OF DISCHARGE:  10/13/2010                        DISCHARGE SUMMARY - REFERRING   PRIMARY CARE PHYSICIAN:  Dr. Delfino Lovett.  DISCHARGE DIAGNOSES: 1. Bilateral pneumonia, likely aspiration versus healthcare     associated. 2. Chronic obstructive pulmonary disease. 3. 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 4. Schizophrenia, stable. 5. Tobacco abuse. 6. Dysphagia.  HISTORY AND BRIEF HOSPITAL COURSE: 1. Mr. [name] is a very pleasant 55 year old gentleman with     schizophrenia and mild mental retardation who resides in a group     home.  He was brought to the emergency room by staff of the group     home on October 06, 2010, because of decreased oral intake,     lethargy, coughing, and appearing ill.  He was diagnosed with     bilateral pneumonia and admitted to the [facility].     Initially, he was placed on IV azithromycin and Rocephin.  After a     period of time, it was determined that he could take p.o.     medications and he was changed to p.o. Avelox.  His white count has     decreased and he is improved clinically and will be discharged on 3     more days of p.o. Avelox to complete his antibiotic therapy. 2. COPD.  The patient has had wheezes although his chest x-ray did not     show a COPD exacerbation.  He was treated with steroids and is     currently on prednisone at 15 mg.  He will be restarted on his     Advair and outpatient medications.  He is still currently wheezing     but it is much improved and his treatment can be completed as an     outpatient. 3. Hypertension.  The patient's blood pressure is elevated into the     150s and 160s systolically.  His pulse went up into the 90s     systolically.  I  believe this may have been because of the     prednisone and steroids he was on, he was started on October 12, 2010, on 12.5 metoprolol b.i.d.  His pulse and blood pressure have     come down as well the patient appears calmer and slightly less     impulsive, I would keep him on these blood pressure medications at     least until his steroids are discontinued and then I would consider     eliminating the blood pressure medications. 4. Dysphagia.  It was felt that the patient's pneumonia could have     been caused from aspiration.  The patient did undergo a Speech     Therapy evaluation and had a modified barium swallow study while he     was here.  The study indicated that the patient needed to be on a  dysphagia 3 diet with nectar thick liquids at all times.  Also, he     needs to be supervised when he is eating at all times and his     medications need to be given in puree.  The patient is at high     aspiration risk secondary to his impulsivity. 5. Schizophrenia.  The patient was maintained on his home psychotropic medications and had no difficulties with his schizophrenia during     this hospitalization.  PHYSICAL EXAMINATION:  GENERAL:  At the time of discharge, the patient is alert and oriented sitting up, being fed applesauce.  He is pleasant, calm. VITAL SIGNS:  Temperature is 98.1, pulse 97, respirations 20, blood pressure currently 143/84.  He has not had his metoprolol yet this morning.  O2 sats 91% on room air. HEENT:  Head is atraumatic, normocephalic.  Eyes are anicteric with pupils that are equal and round.  He does have exophthalmos.  Nose shows no nasal discharge or exterior lesions.  Mouth has moist mucous membranes. NECK:  Supple with midline trachea.  No JVD.  No lymphadenopathy. EXTREMITIES:  Upper extremities are somewhat tremorous.  He has no swelling, cyanosis, or edema in any of his extremities and is able to move all 4 extremities without difficulty.   CHEST:  Demonstrates no accessory muscle use.  He does have bilateral expiratory wheezes posteriorly. HEART:  A regular rate and rhythm without obvious murmurs, rubs, or gallops. ABDOMEN:  Thin, soft, nontender, nondistended with good bowel sounds. SKIN:  No rashes, bruises, or lesions. PSYCHIATRIC:  The patient is alert and oriented.  He is mentally retarded.  He is quite anxious, easily afraid, however, he is receptive to direction, calms easily, and is not a danger to himself.  Grooming is moderate.  LABORATORY DATA:  During this hospitalization, on October 13, 2010, his white count is 12.8, slowly decreasing, hemoglobin 10.6, hematocrit 33.6, platelets 485.  Sodium 144, potassium 4.1, chloride 109, bicarb 32, glucose 92, BUN 10, creatinine 0.82, serum calcium 9.7.  RADIOLOGICAL EXAMS:  On admission, the patient had a two-view chest x- ray that showed underlying chronic lung disease and emphysema with overlying worsening bilateral infiltrates.  On October 12, 2010, he had a followup chest x-ray that showed improving bilateral pneumonia with small left effusion.  DISCHARGE MEDICATIONS: 1. Metoprolol 12.5 mg p.o. b.i.d.  Consider discontinuing this     medication when the patient is off steroids.  Also, this medication     should be held for systolic blood pressure less than 100 or pulse     less than 60. 2. Moxifloxacin 400 mg 1 tablet by mouth daily take for 3 more days. 3. Prednisone 5 mg, this will be tapered.  The patient will be on 15     mg on October 13, 2010, and October 14, 2010, 10 mg on October 15, 2010, and October 16, 2010, 5 mg on October 17, 2010, and October 18, 2010, then stop. 4. Thick-It food thickener 227 g by mouth as needed with all liquids. 5. Advair Diskus 250/50 one puff inhaled twice daily. 6. Albuterol nebulizer 1 nebulization every 6 hours as needed for     shortness of breath or wheeze. 7. Combivent inhaler 2 puffs b.i.d. 8. Detrol LA 4 mg  1 capsule by mouth every morning. 9. Docusate sodium 100 mg 1 tablet by mouth twice daily. 10.Epinephrine 0.3 mg injection, 1 injection subcutaneously p.r.n.  allergic reaction. 11.Fluoxetine 40 mg 1 capsule by mouth every morning. 12.Levothyroxine 75 mcg 1 tablet by mouth every morning. 13.Lithium carbonate 300 mg 1 capsule by mouth 3 times a day. 14.Losartan 100 mg 1 tablet by mouth every morning. 15.Meclizine 25 mg 1 tablet by mouth every 4 hours as needed. 16.Nasonex 1 spray nasally daily. 17.Olanzapine ODT 20 mg 1 tablet under the tongue every evening. 18.Omeprazole 20 mg 1 capsule by mouth twice daily. 19.Potassium chloride 20 mEq by mouth every morning. 20.Pro-Air 90 mcg 2 puffs inhaled every 4 hours as needed for     shortness of breath or wheeze. 21.Trazodone 50 mg 3 tablets by mouth daily at bedtime p.r.n.     insomnia.  DISCHARGE INSTRUCTIONS: 1. Activity:  As tolerated. 2. Diet:  Regular diet dysphagia 3 with all thickened liquids nectar     thick.  The patient needs supervision at all times when he is     eating and drinking.  Pills need to be given in puree such  as     applesauce. 3. We would advise the patient to stop smoking.  He has been on     nicotine patch here in the hospital and has not smoked for the past     week.  We would like to see him continue not to smoke. 4. Follow up with his primary care physician within 2 weeks.  Again,     the patient has been started on a new blood pressure medication     that may be able to be discontinued when his steroids are tapered     off.     Stephani Police, PA   ______________________________ Thad Ranger, MD    MLY/MEDQ  D:  10/13/2010  T:  10/13/2010  Job:  [id number]  cc:   Dr. Delfino Lovett  Electronically Signed by Algis Downs PA on 10/20/2010 09:06:36 AM Electronically Signed by Andres Labrum RAI  on 10/24/2010 01:37:48 PM

## 2010-11-02 ENCOUNTER — Inpatient Hospital Stay: Payer: Self-pay | Admitting: Psychiatry

## 2010-11-03 DIAGNOSIS — R9431 Abnormal electrocardiogram [ECG] [EKG]: Secondary | ICD-10-CM

## 2010-11-30 ENCOUNTER — Emergency Department (HOSPITAL_COMMUNITY)
Admission: EM | Admit: 2010-11-30 | Discharge: 2010-11-30 | Disposition: A | Discharge status: Home Health | Payer: Medicare Other | Attending: Emergency Medicine | Admitting: Emergency Medicine

## 2010-11-30 ENCOUNTER — Emergency Department (HOSPITAL_COMMUNITY): Payer: Medicare Other

## 2010-11-30 ENCOUNTER — Encounter: Payer: Self-pay | Admitting: Emergency Medicine

## 2010-11-30 DIAGNOSIS — F209 Schizophrenia, unspecified: Secondary | ICD-10-CM | POA: Insufficient documentation

## 2010-11-30 DIAGNOSIS — J449 Chronic obstructive pulmonary disease, unspecified: Secondary | ICD-10-CM | POA: Insufficient documentation

## 2010-11-30 DIAGNOSIS — J4489 Other specified chronic obstructive pulmonary disease: Secondary | ICD-10-CM | POA: Insufficient documentation

## 2010-11-30 DIAGNOSIS — R0789 Other chest pain: Secondary | ICD-10-CM | POA: Insufficient documentation

## 2010-11-30 DIAGNOSIS — R404 Transient alteration of awareness: Secondary | ICD-10-CM | POA: Insufficient documentation

## 2010-11-30 DIAGNOSIS — F88 Other disorders of psychological development: Secondary | ICD-10-CM | POA: Insufficient documentation

## 2010-11-30 DIAGNOSIS — R062 Wheezing: Secondary | ICD-10-CM | POA: Insufficient documentation

## 2010-11-30 DIAGNOSIS — T4395XA Adverse effect of unspecified psychotropic drug, initial encounter: Secondary | ICD-10-CM | POA: Insufficient documentation

## 2010-11-30 DIAGNOSIS — T438X5A Adverse effect of other psychotropic drugs, initial encounter: Secondary | ICD-10-CM

## 2010-11-30 HISTORY — DX: Mental disorder, not otherwise specified: F99

## 2010-11-30 LAB — DIFFERENTIAL
Basophils Absolute: 0 10*3/uL (ref 0.0–0.1)
Eosinophils Absolute: 0.6 10*3/uL (ref 0.0–0.7)
Lymphocytes Relative: 25 % (ref 12–46)
Lymphs Abs: 1.8 10*3/uL (ref 0.7–4.0)
Neutrophils Relative %: 56 % (ref 43–77)

## 2010-11-30 LAB — BASIC METABOLIC PANEL
CO2: 31 mEq/L (ref 19–32)
Calcium: 9.3 mg/dL (ref 8.4–10.5)
GFR calc non Af Amer: >90 mL/min (ref >90)
Glucose, Bld: 88 mg/dL (ref 70–99)
Potassium: 3.9 mEq/L (ref 3.5–5.1)
Sodium: 139 mEq/L (ref 135–145)

## 2010-11-30 LAB — CBC
Platelets: 205 10*3/uL (ref 150–400)
RBC: 3.89 MIL/uL — ABNORMAL LOW (ref 4.22–5.81)
RDW: 12.9 % (ref 11.5–15.5)
WBC: 7.1 10*3/uL (ref 4.0–10.5)

## 2010-11-30 LAB — LITHIUM LEVEL: Lithium Lvl: 1.26 mEq/L (ref 0.80–1.40)

## 2010-11-30 LAB — POCT I-STAT TROPONIN I: Troponin i, poc: 0 ng/mL (ref 0.00–0.08)

## 2010-11-30 MED ORDER — AEROCHAMBER PLUS W/MASK MISC
Status: AC
  Filled 2010-11-30: qty 1

## 2010-11-30 MED ORDER — ALBUTEROL SULFATE HFA 108 (90 BASE) MCG/ACT IN AERS
6.0000 | INHALATION_SPRAY | Freq: Once | RESPIRATORY_TRACT | Status: AC
  Administered 2010-11-30: 6 via RESPIRATORY_TRACT
  Filled 2010-11-30: qty 6.7

## 2010-11-30 MED ORDER — IPRATROPIUM BROMIDE HFA 17 MCG/ACT IN AERS: 2.0000 | INHALATION_SPRAY | Freq: Once | RESPIRATORY_TRACT | Status: DC

## 2010-11-30 NOTE — ED Notes (Signed)
Patient lives in a group home and called EMS today for chest pain onset today. EMS arrived patient did not respond until physical stimuli. EMS gave 324mg  aspirin and 1 nitro SL Pain 4/10 chest pain now 0/10.  Alert to normal.

## 2010-11-30 NOTE — ED Notes (Signed)
[name], [facility] with [facility] to take pt home; pt to be discharged to facility.

## 2010-11-30 NOTE — ED Provider Notes (Signed)
History     CSN: [id number] Arrival date & time: 11/30/2010  4:30 PM   First MD Initiated Contact with Patient 11/30/10 1640      Chief Complaint  Patient presents with  . Chest Pain    (Consider location/radiation/quality/duration/timing/severity/associated sxs/prior treatment) The history is provided by the patient and the EMS personnel. The history is limited by a developmental delay.   patient is a 55 old male with a history of schizophrenia, COPD, and recent hospitalization for pneumonia who presents with periods of staring off and generalized chest discomfort. He states that this discomfort started earlier today. He has had mild wheezing. Denies dyspnea or respiratory distress. No recent fever or productive cough. He has not noticed any change in the symptoms with exertion. Patient denies diaphoresis, syncope and nausea. No recent changes to his medications. Per reports in system patient had improvement after his hospitalization for pneumonia. Patient has been staying at group home since her discharge from rehabilitation facility. He states his symptoms started earlier today and were relatively constant throughout the day. Overall severity is described as moderate.  Additional history obtained from group home Interior and spatial designer and nursing staff. Patient had been doing well since discharge from hospital after pneumonia. He however did have psychiatric decompensation and was recently discharged from inpatient psychiatric service 2-3 days ago. They note significant changes in his medications. Patient had an episode today where his arms were held still and he was less interactive.  He would not respond when spoken to but a soon as he was touched  he seemed to snap out of it. No seizure like activity reported. No eye deviation extremity movement drooling or incontinence. Does sit then also happened when EMS was there and again he returned to baseline as soon as he was touched. There is no postictal period.   He was less responsive at that time but there is no change in his respirations and no specific eye deviation.  Past Medical History  Diagnosis Date  . Psychiatric disorder     History reviewed. No pertinent past surgical history.  History reviewed. No pertinent family history.  History  Substance Use Topics  . Smoking status: Never Smoker   . Smokeless tobacco: Not on file  . Alcohol Use: Not on file      Review of Systems  Constitutional: Negative for fever, chills and activity change.  HENT: Negative for congestion and neck pain.   Respiratory: Positive for wheezing. Negative for cough, chest tightness and shortness of breath.   Cardiovascular: Negative for palpitations and leg swelling.  Gastrointestinal: Negative for nausea, vomiting, abdominal pain, diarrhea and abdominal distention.  Genitourinary: Negative for difficulty urinating.  Musculoskeletal: Negative for gait problem.  Skin: Negative for rash.  Neurological: Negative for weakness and numbness.  Psychiatric/Behavioral: Negative for agitation.  All other systems reviewed and are negative.    Allergies  Chlorpromazine; Fluphenazine; Lactose intolerance (gi); Peanut-containing drug products; Prolixin; and Thorazine  Home Medications   Current Outpatient Rx  Name Route Sig Dispense Refill  . ALBUTEROL SULFATE HFA 108 (90 BASE) MCG/ACT IN AERS Inhalation Inhale 1 puff into the lungs every 6 (six) hours as needed. Shortness of breath     . IPRATROPIUM-ALBUTEROL 18-103 MCG/ACT IN AERO Inhalation Inhale 2 puffs into the lungs every 6 (six) hours as needed. Shortness of breath.     . AMLODIPINE BESYLATE 10 MG PO TABS Oral Take 10 mg by mouth daily.      Marland Kitchen DIPHENHYDRAMINE HCL 50 MG PO  CAPS Oral Take 50 mg by mouth every 6 (six) hours as needed. Unknown prn.     Marland Kitchen DOCUSATE SODIUM 50 MG/5ML PO LIQD Oral Take 50 mg by mouth daily.      Marland Kitchen LEVOTHYROXINE SODIUM 75 MCG PO TABS Oral Take 75 mcg by mouth daily.      Marland Kitchen  LISINOPRIL 5 MG PO TABS Oral Take 5 mg by mouth daily.      Marland Kitchen OMEPRAZOLE 20 MG PO CPDR Oral Take 20 mg by mouth 2 (two) times daily.      Marland Kitchen POTASSIUM CHLORIDE 20 MEQ PO PACK Oral Take 20 mEq by mouth daily.      Marland Kitchen RISPERIDONE 1 MG PO TABS Oral Take 1 mg by mouth daily.      Marland Kitchen RISPERIDONE 2 MG PO TABS Oral Take 2 mg by mouth at bedtime.      . TAMSULOSIN HCL 0.4 MG PO CAPS Oral Take 0.4 mg by mouth daily after supper.      Marland Kitchen TEMAZEPAM 30 MG PO CAPS Oral Take 30 mg by mouth at bedtime.      . TRAZODONE HCL 100 MG PO TABS Oral Take 100 mg by mouth at bedtime.        BP 128/97  Pulse 67  Temp(Src) 97.9 F (36.6 C) (Oral)  Resp 20  SpO2 94%  Physical Exam  Nursing note and vitals reviewed. Constitutional: He is oriented to person, place, and time. He appears well-developed and well-nourished. No distress.  HENT:  Head: Normocephalic.  Nose: Nose normal.  Eyes: EOM are normal. Pupils are equal, round, and reactive to light.  Neck: Normal range of motion. Neck supple. No JVD present.  Cardiovascular: Normal rate, regular rhythm and intact distal pulses.   No murmur heard. Pulmonary/Chest: Effort normal. No respiratory distress. He has wheezes (diffuse inspiratory and expiratory). He exhibits no tenderness.       Good air movement  Abdominal: Soft. Bowel sounds are normal. He exhibits no distension. There is no tenderness.  Musculoskeletal: Normal range of motion. He exhibits no edema and no tenderness.       No calf ttp  Neurological: He is alert and oriented to person, place, and time.       Normal strength  Mild developmental delay (at baseline per care taker)  Skin: Skin is warm and dry. He is not diaphoretic.  Psychiatric: He has a normal mood and affect. His behavior is normal. Thought content normal.    ED Course  Procedures (including critical care time)  Labs Reviewed  CBC - Abnormal; Notable for the following:    RBC 3.89 (*)    Hemoglobin 11.3 (*)    HCT 35.1 (*)     All other components within normal limits  DIFFERENTIAL - Abnormal; Notable for the following:    Eosinophils Relative 9 (*)    All other components within normal limits  BASIC METABOLIC PANEL  LITHIUM LEVEL  POCT I-STAT TROPONIN I   Dg Chest 2 View  11/30/2010  *RADIOLOGY REPORT*  Clinical Data: 55 year old male with chest pain.  CHEST - 2 VIEW  Comparison: 10/12/2010 and prior chest radiographs dating back to 03/28/2003  Findings: The cardiomediastinal silhouette is unremarkable. COPD/emphysema is again identified. Mild residual opacity within both mid lungs and left lower lung identified. There is no evidence of pleural effusion or pneumothorax. No acute bony abnormalities are identified.  IMPRESSION: Mild residual opacity within both mid lungs and left lower lung compatible with improved  pneumonia.  COPD/emphysema.  Original Report Authenticated By: Rosendo Gros, M.D.    Date: 12/01/2010  Rate: 76  Rhythm: normal sinus rhythm  QRS Axis: normal  Intervals: normal  ST/T Wave abnormalities: normal  Conduction Disutrbances:none  Narrative Interpretation: Normal EKG w/o acute ischemia  Old EKG Reviewed: borderline LVH on previous but not noted on current ecg    1. Adverse reaction to other psychotropic agent       MDM   Clinical picture is consistent with adverse reactions to psychotropic medications.  His chest discomfort is most likely related to his chronic COPD. He does have expiratory wheezes but per his care provider that is typical. He does not note significant change in symptoms after albuterol given. He does not have new hypoxia change in sputum or other evidence of an acute COPD exacerbation. Chest x-ray shows expected changes with resolving known old pneumonia. No new infection. Patient also with out recent fever or productive sputum or leukocytosis. His EKG is unremarkable. Troponin negative.  This combined with the fact that his chest discomfort was present for most of the  day makes acute coronary syndrome very unlikely. Patient also is very atypical chest discomfort. In regards to his brief episodes of altered mental status, this appears to be related to medication side effect. Patient recently admitted for inpatient psychiatric care. Care provider reports numerous changes in his medications that were done over a short time. His lab work here is unremarkable and his lithium level is in normal range. There are no features in these episodes that suggest seizure activity. I discussed importance of return of close follow up with the psychiatrist and PCP with caretaker. Also discussed return precautions. Patient discharged home in good condition.      Milus Glazier 12/01/10 [phone]

## 2010-12-02 NOTE — ED Provider Notes (Signed)
I saw and evaluated the patient, reviewed the resident's note and I agree with the findings and plan, and agree with their ECG interpretation.Chest pain. Doubt cardiac cause. May be related to psych meds  [name]. Rubin Payor, MD 12/02/10 [phone]

## 2010-12-04 ENCOUNTER — Other Ambulatory Visit: Payer: Self-pay

## 2010-12-04 ENCOUNTER — Emergency Department (HOSPITAL_COMMUNITY)
Admission: EM | Admit: 2010-12-04 | Discharge: 2010-12-05 | Disposition: A | Discharge status: Other Acute Inpatient Hospital | Payer: Medicare Other | Attending: Emergency Medicine | Admitting: Emergency Medicine

## 2010-12-04 ENCOUNTER — Emergency Department (HOSPITAL_COMMUNITY): Payer: Medicare Other

## 2010-12-04 ENCOUNTER — Encounter (HOSPITAL_COMMUNITY): Payer: Self-pay | Admitting: Physical Medicine and Rehabilitation

## 2010-12-04 DIAGNOSIS — R0602 Shortness of breath: Secondary | ICD-10-CM | POA: Insufficient documentation

## 2010-12-04 DIAGNOSIS — F79 Unspecified intellectual disabilities: Secondary | ICD-10-CM | POA: Insufficient documentation

## 2010-12-04 DIAGNOSIS — R55 Syncope and collapse: Secondary | ICD-10-CM | POA: Insufficient documentation

## 2010-12-04 DIAGNOSIS — R059 Cough, unspecified: Secondary | ICD-10-CM | POA: Insufficient documentation

## 2010-12-04 DIAGNOSIS — M79609 Pain in unspecified limb: Secondary | ICD-10-CM | POA: Insufficient documentation

## 2010-12-04 DIAGNOSIS — R4789 Other speech disturbances: Secondary | ICD-10-CM | POA: Insufficient documentation

## 2010-12-04 DIAGNOSIS — R05 Cough: Secondary | ICD-10-CM | POA: Insufficient documentation

## 2010-12-04 DIAGNOSIS — R5381 Other malaise: Secondary | ICD-10-CM | POA: Insufficient documentation

## 2010-12-04 DIAGNOSIS — R61 Generalized hyperhidrosis: Secondary | ICD-10-CM | POA: Insufficient documentation

## 2010-12-04 DIAGNOSIS — R5383 Other fatigue: Secondary | ICD-10-CM | POA: Insufficient documentation

## 2010-12-04 DIAGNOSIS — R0789 Other chest pain: Secondary | ICD-10-CM | POA: Insufficient documentation

## 2010-12-04 DIAGNOSIS — Z79899 Other long term (current) drug therapy: Secondary | ICD-10-CM | POA: Insufficient documentation

## 2010-12-04 LAB — BASIC METABOLIC PANEL
CO2: 28 mEq/L (ref 19–32)
Chloride: 105 mEq/L (ref 96–112)
GFR calc Af Amer: >90 mL/min (ref >90)
GFR calc non Af Amer: >90 mL/min (ref >90)
Sodium: 141 mEq/L (ref 135–145)

## 2010-12-04 LAB — CBC
Platelets: 195 10*3/uL (ref 150–400)
RBC: 3.73 MIL/uL — ABNORMAL LOW (ref 4.22–5.81)
WBC: 7.3 10*3/uL (ref 4.0–10.5)

## 2010-12-04 NOTE — ED Notes (Signed)
Pt resting quietly at the time. No signs of distress. Remains on cardiac monitor. Vital signs stable.

## 2010-12-04 NOTE — ED Notes (Signed)
Pt presents to department for evaluation of midsternal chest pain radiating to L arm. Ongoing today, started around 16:00 after eating. Pt states he became sweaty and SOB. Then states chest pressure and L arm numbness. Denies chest pain upon arrival to ED. He is alert and answering questions appropriately. Able to move all extremities without difficulty. Respirations unlabored. Lung sounds clear and equal bilaterally. Group home director at bedside.

## 2010-12-04 NOTE — ED Notes (Signed)
Pt presents to department via [facility] for evaluation of midsternal chest pain radiating to left arm. Pt from group home. Onset this afternoon around 16:00 after eating. Pt states he became diaphoretic and SOB. Denies chest pain upon arrival. 20g L forearm. Received 324 ASA per EMS. Pt is alert and oriented x4 per normal baseline.

## 2010-12-04 NOTE — ED Notes (Signed)
Pt returned to exam room from x-ray. Lab at the bedside attempting to draw. Pt remains alert, no signs of distress. Vital signs stable.

## 2010-12-05 ENCOUNTER — Encounter (HOSPITAL_COMMUNITY): Payer: Self-pay | Admitting: Internal Medicine

## 2010-12-05 MED ORDER — ALBUTEROL SULFATE (5 MG/ML) 0.5% IN NEBU
5.0000 mg | INHALATION_SOLUTION | Freq: Once | RESPIRATORY_TRACT | Status: AC
  Administered 2010-12-05: 5 mg via RESPIRATORY_TRACT
  Filled 2010-12-05: qty 1

## 2010-12-05 MED ORDER — POTASSIUM CHLORIDE 20 MEQ/15ML (10%) PO LIQD
40.0000 meq | Freq: Once | ORAL | Status: AC
  Administered 2010-12-05: 40 meq via ORAL
  Filled 2010-12-05: qty 30

## 2010-12-05 MED ORDER — IOHEXOL 300 MG/ML  SOLN
100.0000 mL | Freq: Once | INTRAMUSCULAR | Status: AC | PRN
  Administered 2010-12-05: 100 mL via INTRAVENOUS

## 2010-12-05 MED ORDER — IPRATROPIUM BROMIDE 0.02 % IN SOLN
0.5000 mg | Freq: Once | RESPIRATORY_TRACT | Status: AC
  Administered 2010-12-05: 0.5 mg via RESPIRATORY_TRACT
  Filled 2010-12-05: qty 2.5

## 2010-12-05 NOTE — BH Assessment (Signed)
Assessment Note   [name] is an 55 y.o. male that was referred to [facility] [place] due to medical issues.  Per EDP Delo, pt did not meet criteria for medical admission.  Pt's group home manger ([address] Valentina Lucks with [employer] [phone]) present and stated that she did not feel she could take pt back to group home due to his medical needs, as she has had to call the ambulance for medical issues three times in last week.  Pt denies SI/HI/psychosis.  Pt does have a history of dx of Schizoaffective Disorder and pt is also MR.  Group home manager has limited information, as pt has only been with her for a short time.  Pt's guardian is Kerney Elbe [phone].  Pt does take psychotropic meds as prescribed.  Pt did have psychiatric inpatient admission in September 2012 for SI.  Pt denies SA.  Consulted with EDP Lynelle Doctor who agreed that ACT services are not appropriate.  Therefore, SW consult requested by EDP Lynelle Doctor, as this is a placement issue (group home may not let him return).  Completed assessment and faxed assessment notification to [facility] to log.  Updated ED staff.  Axis I: Schizoaffective Disorder Axis II: Mental retardation, severity unknown Axis III:  Past Medical History  Diagnosis Date  . Psychiatric disorder    Axis IV: housing problems and problems with primary support group Axis V: 61-70 mild symptoms  Past Medical History:  Past Medical History  Diagnosis Date  . Psychiatric disorder     History reviewed. No pertinent past surgical history.  Family History: History reviewed. No pertinent family history.  Social History:  reports that he has been smoking Cigarettes.  He has been smoking about .25 packs per day. He has never used smokeless tobacco. He reports that he does not drink alcohol or use illicit drugs.  Allergies:  Allergies  Allergen Reactions  . Chlorpromazine     unknown  . Fluphenazine     unknown  . Lactose Intolerance (Gi)     unknown  . Peanut-Containing Drug  Products     unknown  . Prolixin (Fluphenazine Hcl)     unknown  . Thorazine (Chlorpromazine Hcl)     unknown    Home Medications:  Medications Prior to Admission  Medication Dose Route Frequency Provider Last Rate Last Dose  . albuterol (PROVENTIL) (5 MG/ML) 0.5% nebulizer solution 5 mg  5 mg Nebulization Once Shaaron Adler, PA   5 mg at 12/05/10 0055  . iohexol (OMNIPAQUE) 300 MG/ML injection 100 mL  100 mL Intravenous Once PRN Medication Radiologist   100 mL at 12/05/10 0001  . potassium chloride 20 MEQ/15ML (10%) liquid 40 mEq  40 mEq Oral Once Shaaron Adler, [place]   40 mEq at 12/05/10 0055   Medications Prior to Admission  Medication Sig Dispense Refill  . albuterol (PROVENTIL HFA;VENTOLIN HFA) 108 (90 BASE) MCG/ACT inhaler Inhale 1 puff into the lungs every 6 (six) hours as needed. Shortness of breath       . albuterol-ipratropium (COMBIVENT) 18-103 MCG/ACT inhaler Inhale 2 puffs into the lungs every 6 (six) hours as needed. Shortness of breath.       Marland Kitchen amLODipine (NORVASC) 10 MG tablet Take 10 mg by mouth daily.        . diphenhydrAMINE (BENADRYL) 50 MG capsule Take 50 mg by mouth every 6 (six) hours as needed. For insomnia      . docusate (COLACE) 50 MG/5ML liquid Take 50 mg  by mouth daily.        Marland Kitchen levothyroxine (SYNTHROID, LEVOTHROID) 75 MCG tablet Take 75 mcg by mouth daily.        Marland Kitchen lisinopril (PRINIVIL,ZESTRIL) 5 MG tablet Take 5 mg by mouth daily.        Marland Kitchen omeprazole (PRILOSEC) 20 MG capsule Take 20 mg by mouth 2 (two) times daily.        . potassium chloride (KLOR-CON) 20 MEQ packet Take 20 mEq by mouth daily.        . risperiDONE (RISPERDAL) 1 MG tablet Take 1 mg by mouth daily.        . risperiDONE (RISPERDAL) 2 MG tablet Take 2 mg by mouth at bedtime.        . Tamsulosin HCl (FLOMAX) 0.4 MG CAPS Take 0.4 mg by mouth daily after supper.        . temazepam (RESTORIL) 30 MG capsule Take 30 mg by mouth at bedtime.        . traZODone (DESYREL) 100 MG tablet  Take 100 mg by mouth at bedtime.          OB/GYN Status:  No LMP for male patient.  General Assessment Data Assessment Number: 1  Living Arrangements: Group Home Can pt return to current living arrangement?: No (Group home manager believes pt needs more intense care) Admission Status: Voluntary Is patient capable of signing voluntary admission?: Yes Transfer from: Acute Hospital Referral Source: Other (Group home)  Risk to self Suicidal Ideation: No Suicidal Intent: No Is patient at risk for suicide?: No (Has threatened suicide in past) Suicidal Plan?: No Access to Means: No What has been your use of drugs/alcohol within the last 12 months?: Denies Other Self Harm Risks: n/a Triggers for Past Attempts: Unknown Intentional Self Injurious Behavior: None Factors that decrease suicide risk: Positive social support Family Suicide History: Unknown Recent stressful life event(s): Other (Comment) (Medical issues) Persecutory voices/beliefs?: No Depression: No Depression Symptoms:  (Denies) Substance abuse history and/or treatment for substance abuse?: No (Denies) Suicide prevention information given to non-admitted patients: Not applicable  Risk to Others Homicidal Ideation: No Thoughts of Harm to Others: No Current Homicidal Intent: No Current Homicidal Plan: No Access to Homicidal Means: No Identified Victim: n/a History of harm to others?: No Assessment of Violence: None Noted Violent Behavior Description: n/a - pt calm, cooeprative Does patient have access to weapons?: No Criminal Charges Pending?: No Does patient have a court date: No  Mental Status Report Appear/Hygiene: Disheveled Eye Contact: Good Motor Activity: Psychomotor retardation Speech: Logical/coherent Level of Consciousness: Alert Mood: Other (Comment) (Euthymic) Affect: Appropriate to circumstance Anxiety Level: Minimal Thought Processes: Coherent;Relevant Judgement: Unimpaired Orientation:  Person;Place;Time;Situation Obsessive Compulsive Thoughts/Behaviors: None  Cognitive Functioning Concentration: Normal Memory: Recent Intact;Remote Intact IQ: Below Average Level of Function: MR Insight: Fair Impulse Control: Fair Appetite: Good Weight Loss: 0  Weight Gain: 0  Sleep: No Change Total Hours of Sleep: 8  Vegetative Symptoms: None  Prior Inpatient/Outpatient Therapy Prior Therapy: Inpatient Prior Therapy Dates: 2012 Prior Therapy Facilty/Provider(s): [facility] Reason for Treatment: SI          Abuse/Neglect Assessment (Assessment to be complete while patient is alone) Physical Abuse: Denies Verbal Abuse: Denies Sexual Abuse: Denies Exploitation of patient/patient's resources: Denies Self-Neglect: Denies Values / Beliefs Cultural Requests During Hospitalization: None Spiritual Requests During Hospitalization: None Consults Spiritual Care Consult Needed: No Social Work Consult Needed: Yes (Comment) (EDP Lynelle Doctor is calling for consult, as this is placement issue) Advance  Directives (For Healthcare) Advance Directive: Patient does not have advance directive;Patient would not like information    Additional Information 1:1 In Past 12 Months?: No CIRT Risk: No Elopement Risk: No Does patient have medical clearance?: Yes     Disposition:  Disposition Disposition of Patient: Other dispositions (SW consult) Other disposition(s): Other (Comment) (Pt referred to SW per EDP Lynelle Doctor)  On Site Evaluation by:   Reviewed with Physician:  Pat Patrick, Rennis Harding 12/05/2010 8:58 AM

## 2010-12-05 NOTE — Progress Notes (Signed)
Received call for consult for Long Term Acute Care. Pt is 55 yr old with behavorial issues/MR from a Group home and Group Home will not take him back. LTAC is not an appropriate placement and will. Advised MD that we would consult CSW. Called [place], AD CSW to find out who to contact for ED. Referral given to [facility] [phone]). Genella Rife Weeks 12/05/2010 845am

## 2010-12-05 NOTE — ED Notes (Signed)
Pt returned to exam room from CT scan. Vital signs stable. Pt remains on cardiac monitor.

## 2010-12-05 NOTE — ED Notes (Signed)
Carolanne Grumbling phone # [phone]. Please call if social work arrives to see patient

## 2010-12-05 NOTE — ED Notes (Signed)
RT called for Tx

## 2010-12-05 NOTE — Progress Notes (Signed)
Received call from [facility] SN[facility]: [facility] and they will be able to take patient today to SNF.  Will get MD to sign FL2 and arrange transport.  Passar is still valid until 12/11 and faxed to facility.   Discussed plan with [facility] director and team and all agreeable.  Plan: DC to SNF.  No other needs at this time.  Ashley Jacobs, MSW LCSWA [phone]

## 2010-12-05 NOTE — ED Notes (Signed)
Pt becomes SOB on exertion. Nebulizer treatment started. Pt remains on cardiac monitor. Vital signs stable. Denies chest pain. Friend remains at bedside. No signs of distress at the present.

## 2010-12-05 NOTE — ED Provider Notes (Signed)
History     CSN: [id number] Arrival date & time: 12/04/2010  8:35 PM   First MD Initiated Contact with Patient 12/04/10 2039      Chief Complaint  Patient presents with  . Chest Pain    (Consider location/radiation/quality/duration/timing/severity/associated sxs/prior treatment) Patient is a 55 y.o. male presenting with chest pain. The history is provided by the patient (Group home worker).  Chest Pain The chest pain began 3 - 5 hours ago. Chest pain occurs constantly. The chest pain is resolved. The pain is associated with eating. The severity of the pain is severe. The quality of the pain is described as tightness. Radiates to: Pain was located in the left chest and had radiation to the left hand. Exacerbated by: Nothing. Primary symptoms include syncope, shortness of breath, cough and wheezing. Pertinent negatives for primary symptoms include no fever, no palpitations, no abdominal pain, no nausea, no vomiting, no dizziness and no altered mental status.  Before the onset of the syncopal episode there was weakness. There was no dizziness or nausea. Syncope circumstances: There were multiple syncopal episodes. The syncopal episode occurred with shortness of breath. The syncopal episode did not occur with palpitations or headaches. There is no history of seizures.  The patient's medical history is significant for COPD and chronic lung disease.  The patient's medical history is significant for COPD and chronic lung disease.  Associated symptoms include diaphoresis and weakness.  Pertinent negatives for associated symptoms include no numbness. He tried aspirin for the symptoms.  Pertinent negatives for past medical history include no seizures.     Past Medical History  Diagnosis Date  . Psychiatric disorder     No past surgical history on file.  No family history on file.  History  Substance Use Topics  . Smoking status: Never Smoker   . Smokeless tobacco: Never Used  . Alcohol  Use: Not on file      Review of Systems  Constitutional: Positive for diaphoresis. Negative for fever and chills.  HENT: Negative for nosebleeds, sore throat, neck pain, neck stiffness and tinnitus.   Eyes: Negative for pain and visual disturbance.  Respiratory: Positive for cough, chest tightness, shortness of breath and wheezing.   Cardiovascular: Positive for chest pain and syncope. Negative for palpitations and leg swelling.  Gastrointestinal: Negative for nausea, vomiting and abdominal pain.  Genitourinary: Negative for hematuria and flank pain.  Musculoskeletal: Negative for back pain.       Chronic gait instability  Skin: Negative for rash and wound.  Neurological: Positive for syncope and weakness. Negative for dizziness, seizures, numbness and headaches.       Chronic speech deficit  Psychiatric/Behavioral: Negative for confusion and altered mental status.       Chronic psychiatric illnesses    Allergies  Chlorpromazine; Fluphenazine; Lactose intolerance (gi); Peanut-containing drug products; Prolixin; and Thorazine  Home Medications   Current Outpatient Rx  Name Route Sig Dispense Refill  . ALBUTEROL SULFATE HFA 108 (90 BASE) MCG/ACT IN AERS Inhalation Inhale 1 puff into the lungs every 6 (six) hours as needed. Shortness of breath     . IPRATROPIUM-ALBUTEROL 18-103 MCG/ACT IN AERO Inhalation Inhale 2 puffs into the lungs every 6 (six) hours as needed. Shortness of breath.     . AMLODIPINE BESYLATE 10 MG PO TABS Oral Take 10 mg by mouth daily.      Marland Kitchen DIPHENHYDRAMINE HCL 50 MG PO CAPS Oral Take 50 mg by mouth every 6 (six) hours as needed. For insomnia    .  DOCUSATE SODIUM 50 MG/5ML PO LIQD Oral Take 50 mg by mouth daily.      Marland Kitchen LEVOTHYROXINE SODIUM 75 MCG PO TABS Oral Take 75 mcg by mouth daily.      Marland Kitchen LISINOPRIL 5 MG PO TABS Oral Take 5 mg by mouth daily.      Marland Kitchen OMEPRAZOLE 20 MG PO CPDR Oral Take 20 mg by mouth 2 (two) times daily.      Marland Kitchen POTASSIUM CHLORIDE 20 MEQ PO  PACK Oral Take 20 mEq by mouth daily.      Marland Kitchen RISPERIDONE 1 MG PO TABS Oral Take 1 mg by mouth daily.      Marland Kitchen RISPERIDONE 2 MG PO TABS Oral Take 2 mg by mouth at bedtime.      . TAMSULOSIN HCL 0.4 MG PO CAPS Oral Take 0.4 mg by mouth daily after supper.      Marland Kitchen TEMAZEPAM 30 MG PO CAPS Oral Take 30 mg by mouth at bedtime.      . TRAZODONE HCL 100 MG PO TABS Oral Take 100 mg by mouth at bedtime.        BP 143/93  Pulse 69  Temp(Src) 97.8 F (36.6 C) (Oral)  Resp 16  SpO2 96%  Physical Exam  Nursing note and vitals reviewed. Constitutional: He is oriented to person, place, and time. He appears well-developed and well-nourished. No distress.  HENT:  Head: Normocephalic and atraumatic.  Right Ear: External ear normal.  Left Ear: External ear normal.  Mouth/Throat: Oropharynx is clear and moist.  Eyes: EOM are normal. Pupils are equal, round, and reactive to light.  Neck: Normal range of motion. Neck supple.  Cardiovascular: Normal rate, regular rhythm, normal heart sounds and intact distal pulses.   Pulmonary/Chest: He exhibits no tenderness.       Slightly increased work of breathing. There are wheezes heard in all lung fields  Abdominal: Soft. Bowel sounds are normal. He exhibits no distension. There is no tenderness.  Musculoskeletal: Normal range of motion. He exhibits no edema and no tenderness.  Lymphadenopathy:    He has no cervical adenopathy.  Neurological: He is alert and oriented to person, place, and time. No cranial nerve deficit.       Slow and slightly slurred speech which is normal for the patient  Skin: Skin is warm and dry. No rash noted.  Psychiatric: His behavior is normal.    ED Course  Procedures (including critical care time)  Date: 12/04/2010  Rate: 73  Rhythm: sinus  QRS Axis: normal  Intervals: normal  ST/T Wave abnormalities: normal  Conduction Disutrbances:none  Narrative Interpretation:   Old EKG Reviewed: unchanged     Labs Reviewed  CBC -  Abnormal; Notable for the following:    RBC 3.73 (*)    Hemoglobin 10.8 (*)    HCT 33.1 (*)    All other components within normal limits  BASIC METABOLIC PANEL - Abnormal; Notable for the following:    Potassium 3.2 (*)    All other components within normal limits  POCT I-STAT TROPONIN I  POCT I-STAT TROPONIN I  I-STAT TROPONIN I  I-STAT TROPONIN I   Dg Chest 2 View  12/04/2010  *RADIOLOGY REPORT*  Clinical Data: Chest pain with confusion.  CHEST - 2 VIEW  Comparison: 11/30/2010 and 10/12/2010.  Findings: The heart size and mediastinal contours are stable. There is chronic lung disease with multifocal residual densities. At the right lung base, there is slightly increased focal density which could  reflect a worsening infiltrate.  A small left pleural effusion appears unchanged.  Osseous structures are stable.  IMPRESSION: Slight worsening of nodular component at the right lung base. Otherwise stable chronic lung disease.  Original Report Authenticated By: Gerrianne Scale, M.D.   Ct Angio Chest W/cm &/or Wo Cm  12/05/2010  *RADIOLOGY REPORT*  Clinical Data:  Chest pain with shortness of breath, syncope and left arm numbness.  Question pulmonary embolism.  CT ANGIOGRAPHY CHEST WITH CONTRAST  Technique:  Multidetector CT imaging of the chest was performed using the standard protocol during bolus administration of intravenous contrast.  Multiplanar CT image reconstructions including MIPs were obtained to evaluate the vascular anatomy.  Contrast: OMNIPAQUE IOHEXOL 300 MG/ML IV SOLN  Comparison:  Prior chest radiographs ranging from 10/01/2010 through 12/04/2010.  Findings:  The pulmonary arteries are well opacified with contrast. There is no evidence of acute pulmonary embolism.  Minimal aortic atherosclerosis is present.  There are several mildly enlarged mediastinal and hilar lymph nodes.  A precarinal node measures 12 mm on image 44.  There is a 15 mm right hilar node on image 48.  A small  amount of pleural fluid is present on the left.  There is no significant right pleural or pericardial effusion.  There are mild emphysematous changes.  Patchy pulmonary opacities are present in both lungs as demonstrated on recent prior radiographs.  Greatest involvement is present in the superior segments of both lower lobes and within the lingula.  There is no well-defined mass or endobronchial lesion.  The questioned increased density at the right lung base on today's radiographs appears to be due to healing fractures of the right fifth and sixth ribs anteriorly.  There also healing fractures of the lower left ribs posteriorly.  No pneumothorax is demonstrated.  The visualized upper abdomen appears unremarkable.  Review of the MIP images confirms the above findings.  IMPRESSION:  1.  No evidence of acute pulmonary embolism. 2.  Improving bilateral air space opacities, likely reflecting resolving pneumonia superimposed on chronic lung disease. Continued radiographic followup is recommended. 3.  Nonspecific mediastinal and hilar adenopathy may be reactive to suspected pneumonia.  A neoplastic process cannot be excluded and CT followup in 3 - 6 months suggested. 4.  Healing rib fractures bilaterally account for the questioned nodularity at the right lung base on today's radiographs.  Original Report Authenticated By: Gerrianne Scale, M.D.     1. Atypical Chest Pain 2. COPD     MDM  The patient has had multiple evaluations for his current complaints recently. I reviewed the labs and imaging studies. I have given the patient potassium supplementation for his hypokalemia. His symptoms appear to be atypical chest pain that are not consistent with ACS given his lack of elevated troponin on measurements at his previous visit and today. I have ordered a three-hour troponin for trending. The patient did have a CT angiogram of the chest to rule out a pulmonary embolus causing his symptoms; this was negative for  pulmonary embolus and did note improving infiltrate. I discussed this case with my attending who agrees that primary care followup and further testing would be the best plan for this patient. Upon discussing this plan with the group home worker who is present with the patient, she expresses concern and discomfort with the patient returning to the group home in his current state. She requests that an order be placed for skilled nursing. I have discussed this with the evening attending  Dr. and he will complete the patient's care accordingly.        [address] [place], [place] 12/05/10 [phone]

## 2010-12-05 NOTE — ED Notes (Signed)
Pt to be transported ou to nursing facility, waiting on PTAR for transportation.

## 2010-12-05 NOTE — ED Provider Notes (Signed)
08:25 am [name], Phillip states patient does not meet criteria for psychiatric admission. Will consult social worker.   08:43 [name], Case Manger will have social worker come see patient  09:00 caregivers relate patient has frequent episodes where his eyes rolled back his tongue sticks out the left he has a left facial droop and he appears to have trouble breathing. They state that last 2-5 minutes and then he complains of numbness in his lip and face. ICA and admission here in October when he had pneumonia, but I don't see any evaluation for possible seizures or arrhythmia.  10:10 pt getting wheezing and appears SOB, albuterol/atroven nebulizer ordered.   10:30 [name], Child psychotherapist has found placement at [facility] (skilled nursing facility) in [place]  Plan discharge patient.   Ward Givens, MD 12/05/10 [phone]

## 2010-12-05 NOTE — ED Notes (Signed)
Pt resting quietly at the time. Denies pain. Respirations unlabored. Remains on cardiac monitor. Pt alert and oriented x4.

## 2010-12-05 NOTE — ED Notes (Signed)
Waiting on social work consult for pt.

## 2010-12-05 NOTE — Progress Notes (Signed)
Clinical Social Work received consult for ?placement for patient who comes to the ED from a group home.  Met with patient, group home director, and group home assistance at bedside.  Director reports no behavioral problems for patient, but due to multiple medical problems, multiple calls for EMS, and need for medical attention the group home cannot provide these needs/provide adequate care for patient, they will not be able to take patient back.  Patient has been at the group home since September 2012.  Patient also has a legal guarding (aunt) who is not present in the hospital, but has been contacted and is agreeable with plan.   Patient will be referred out to [facility] SNF for medical issues and placement.  CSW called and spoke with admissions and faxed referral for review (including clinicals, FL2, and passar).  Awaiting to hear if they will be able to take patient.  Patient is calm and laying in bed.  Group Home workers at bedside and plan is for SNF if bed available and [facility] is able to take patient.  Will continue to follow and continue to assist in dc process.  Ashley Jacobs, MSW LCSWA [phone]  1007am

## 2010-12-05 NOTE — Consult Note (Signed)
Reason for Consult:Shortness of breath Referring Physician: Dr Debroah Baller is an 55 y.o. male.  HPI: 55 Yo here from a group home with behavioral problem and respiratory problem. Seen and evaluated and found to have resolving pneumonia on Chest CT. No other medical reason to justify hospital admission. The group home reportedly cannot handle the patient.  Past Medical History  Diagnosis Date  . Psychiatric disorder     History reviewed. No pertinent past surgical history.  History reviewed. No pertinent family history.  Social History:  reports that he has never smoked. He has never used smokeless tobacco. He reports that he does not use illicit drugs. His alcohol history not on file.  Allergies:  Allergies  Allergen Reactions  . Chlorpromazine     unknown  . Fluphenazine     unknown  . Lactose Intolerance (Gi)     unknown  . Peanut-Containing Drug Products     unknown  . Prolixin (Fluphenazine Hcl)     unknown  . Thorazine (Chlorpromazine Hcl)     unknown    Medications: I have reviewed the patient's current medications.  Results for orders placed during the hospital encounter of 12/04/10 (from the past 48 hour(s))  CBC     Status: Abnormal   Collection Time   12/04/10 10:06 PM      Component Value Range Comment   WBC 7.3  4.0 - 10.5 (K/uL)    RBC 3.73 (*) 4.22 - 5.81 (MIL/uL)    Hemoglobin 10.8 (*) 13.0 - 17.0 (g/dL)    HCT 29.5 (*) 62.1 - 52.0 (%)    MCV 88.7  78.0 - 100.0 (fL)    MCH 29.0  26.0 - 34.0 (pg)    MCHC 32.6  30.0 - 36.0 (g/dL)    RDW 30.8  65.7 - 84.6 (%)    Platelets 195  150 - 400 (K/uL)   BASIC METABOLIC PANEL     Status: Abnormal   Collection Time   12/04/10 10:06 PM      Component Value Range Comment   Sodium 141  135 - 145 (mEq/L)    Potassium 3.2 (*) 3.5 - 5.1 (mEq/L)    Chloride 105  96 - 112 (mEq/L)    CO2 28  19 - 32 (mEq/L)    Glucose, Bld 91  70 - 99 (mg/dL)    BUN 9  6 - 23 (mg/dL)    Creatinine, Ser 9.62  0.50 - 1.35  (mg/dL)    Calcium 9.3  8.4 - 10.5 (mg/dL)    GFR calc non Af Amer >90  >90 (mL/min)    GFR calc Af Amer >90  >90 (mL/min)   POCT I-STAT TROPONIN I     Status: Normal   Collection Time   12/04/10 10:24 PM      Component Value Range Comment   Troponin i, poc 0.00  0.00 - 0.08 (ng/mL)    Comment 3            POCT I-STAT TROPONIN I     Status: Normal   Collection Time   12/05/10 12:42 AM      Component Value Range Comment   Troponin i, poc 0.00  0.00 - 0.08 (ng/mL)    Comment 3              Dg Chest 2 View  12/04/2010  *RADIOLOGY REPORT*  Clinical Data: Chest pain with confusion.  CHEST - 2 VIEW  Comparison: 11/30/2010 and 10/12/2010.  Findings: The heart size and mediastinal contours are stable. There is chronic lung disease with multifocal residual densities. At the right lung base, there is slightly increased focal density which could reflect a worsening infiltrate.  A small left pleural effusion appears unchanged.  Osseous structures are stable.  IMPRESSION: Slight worsening of nodular component at the right lung base. Otherwise stable chronic lung disease.  Original Report Authenticated By: Gerrianne Scale, M.D.   Ct Angio Chest W/cm &/or Wo Cm  12/05/2010  *RADIOLOGY REPORT*  Clinical Data:  Chest pain with shortness of breath, syncope and left arm numbness.  Question pulmonary embolism.  CT ANGIOGRAPHY CHEST WITH CONTRAST  Technique:  Multidetector CT imaging of the chest was performed using the standard protocol during bolus administration of intravenous contrast.  Multiplanar CT image reconstructions including MIPs were obtained to evaluate the vascular anatomy.  Contrast: OMNIPAQUE IOHEXOL 300 MG/ML IV SOLN  Comparison:  Prior chest radiographs ranging from 10/01/2010 through 12/04/2010.  Findings:  The pulmonary arteries are well opacified with contrast. There is no evidence of acute pulmonary embolism.  Minimal aortic atherosclerosis is present.  There are several mildly enlarged  mediastinal and hilar lymph nodes.  A precarinal node measures 12 mm on image 44.  There is a 15 mm right hilar node on image 48.  A small amount of pleural fluid is present on the left.  There is no significant right pleural or pericardial effusion.  There are mild emphysematous changes.  Patchy pulmonary opacities are present in both lungs as demonstrated on recent prior radiographs.  Greatest involvement is present in the superior segments of both lower lobes and within the lingula.  There is no well-defined mass or endobronchial lesion.  The questioned increased density at the right lung base on today's radiographs appears to be due to healing fractures of the right fifth and sixth ribs anteriorly.  There also healing fractures of the lower left ribs posteriorly.  No pneumothorax is demonstrated.  The visualized upper abdomen appears unremarkable.  Review of the MIP images confirms the above findings.  IMPRESSION:  1.  No evidence of acute pulmonary embolism. 2.  Improving bilateral air space opacities, likely reflecting resolving pneumonia superimposed on chronic lung disease. Continued radiographic followup is recommended. 3.  Nonspecific mediastinal and hilar adenopathy may be reactive to suspected pneumonia.  A neoplastic process cannot be excluded and CT followup in 3 - 6 months suggested. 4.  Healing rib fractures bilaterally account for the questioned nodularity at the right lung base on today's radiographs.  Original Report Authenticated By: Gerrianne Scale, M.D.    Review of Systems  Constitutional: Negative.   HENT: Negative.   Eyes: Negative.   Respiratory: Positive for shortness of breath.   Cardiovascular: Negative.   Gastrointestinal: Negative.   Genitourinary: Negative.   Musculoskeletal: Negative.   Skin: Negative.   Neurological: Negative.   Endo/Heme/Allergies: Negative.   Psychiatric/Behavioral: Negative.    Blood pressure 119/72, pulse 70, temperature 97.8 F (36.6 C),  temperature source Oral, resp. rate 13, SpO2 97.00%. Physical Exam  Constitutional: He appears well-developed and well-nourished.       Mentally retarded  HENT:  Head: Normocephalic and atraumatic.  Right Ear: External ear normal.  Left Ear: External ear normal.  Eyes: Conjunctivae and EOM are normal. Pupils are equal, round, and reactive to light.  Neck: Normal range of motion. Neck supple.  Cardiovascular: Normal rate, regular rhythm, normal heart sounds and intact distal pulses.   Respiratory:  Effort normal and breath sounds normal.  GI: Soft. Bowel sounds are normal.  Musculoskeletal: Normal range of motion.  Neurological: He is alert.  Skin: Skin is warm and dry.  Psychiatric: His mood appears anxious. He is slowed. Cognition and memory are impaired.    Assessment/Plan: 1. Patient does not seem to have an urgent need for Medical admission. It seems his problems are more Social. I recommend Ec[facility].  GARBA,LAWAL 12/05/2010, 6:04 AM

## 2010-12-05 NOTE — ED Provider Notes (Signed)
History     CSN: [id number] Arrival date & time: 12/04/2010  8:35 PM   First MD Initiated Contact with Patient 12/04/10 2039      Chief Complaint  Patient presents with  . Chest Pain    (Consider location/radiation/quality/duration/timing/severity/associated sxs/prior treatment) HPI  Past Medical History  Diagnosis Date  . Psychiatric disorder     No past surgical history on file.  No family history on file.  History  Substance Use Topics  . Smoking status: Never Smoker   . Smokeless tobacco: Never Used  . Alcohol Use: Not on file      Review of Systems  Allergies  Chlorpromazine; Fluphenazine; Lactose intolerance (gi); Peanut-containing drug products; Prolixin; and Thorazine  Home Medications   Current Outpatient Rx  Name Route Sig Dispense Refill  . ALBUTEROL SULFATE HFA 108 (90 BASE) MCG/ACT IN AERS Inhalation Inhale 1 puff into the lungs every 6 (six) hours as needed. Shortness of breath     . IPRATROPIUM-ALBUTEROL 18-103 MCG/ACT IN AERO Inhalation Inhale 2 puffs into the lungs every 6 (six) hours as needed. Shortness of breath.     . AMLODIPINE BESYLATE 10 MG PO TABS Oral Take 10 mg by mouth daily.      Marland Kitchen DIPHENHYDRAMINE HCL 50 MG PO CAPS Oral Take 50 mg by mouth every 6 (six) hours as needed. For insomnia    . DOCUSATE SODIUM 50 MG/5ML PO LIQD Oral Take 50 mg by mouth daily.      Marland Kitchen LEVOTHYROXINE SODIUM 75 MCG PO TABS Oral Take 75 mcg by mouth daily.      Marland Kitchen LISINOPRIL 5 MG PO TABS Oral Take 5 mg by mouth daily.      Marland Kitchen OMEPRAZOLE 20 MG PO CPDR Oral Take 20 mg by mouth 2 (two) times daily.      Marland Kitchen POTASSIUM CHLORIDE 20 MEQ PO PACK Oral Take 20 mEq by mouth daily.      Marland Kitchen RISPERIDONE 1 MG PO TABS Oral Take 1 mg by mouth daily.      Marland Kitchen RISPERIDONE 2 MG PO TABS Oral Take 2 mg by mouth at bedtime.      . TAMSULOSIN HCL 0.4 MG PO CAPS Oral Take 0.4 mg by mouth daily after supper.      Marland Kitchen TEMAZEPAM 30 MG PO CAPS Oral Take 30 mg by mouth at bedtime.      . TRAZODONE  HCL 100 MG PO TABS Oral Take 100 mg by mouth at bedtime.        BP 119/72  Pulse 70  Temp(Src) 97.8 F (36.6 C) (Oral)  Resp 13  SpO2 97%  Physical Exam  ED Course  Procedures (including critical care time)  Labs Reviewed  CBC - Abnormal; Notable for the following:    RBC 3.73 (*)    Hemoglobin 10.8 (*)    HCT 33.1 (*)    All other components within normal limits  BASIC METABOLIC PANEL - Abnormal; Notable for the following:    Potassium 3.2 (*)    All other components within normal limits  POCT I-STAT TROPONIN I  POCT I-STAT TROPONIN I  I-STAT TROPONIN I  I-STAT TROPONIN I   Dg Chest 2 View  12/04/2010  *RADIOLOGY REPORT*  Clinical Data: Chest pain with confusion.  CHEST - 2 VIEW  Comparison: 11/30/2010 and 10/12/2010.  Findings: The heart size and mediastinal contours are stable. There is chronic lung disease with multifocal residual densities. At the right lung base, there is slightly increased  focal density which could reflect a worsening infiltrate.  A small left pleural effusion appears unchanged.  Osseous structures are stable.  IMPRESSION: Slight worsening of nodular component at the right lung base. Otherwise stable chronic lung disease.  Original Report Authenticated By: Gerrianne Scale, M.D.   Ct Angio Chest W/cm &/or Wo Cm  12/05/2010  *RADIOLOGY REPORT*  Clinical Data:  Chest pain with shortness of breath, syncope and left arm numbness.  Question pulmonary embolism.  CT ANGIOGRAPHY CHEST WITH CONTRAST  Technique:  Multidetector CT imaging of the chest was performed using the standard protocol during bolus administration of intravenous contrast.  Multiplanar CT image reconstructions including MIPs were obtained to evaluate the vascular anatomy.  Contrast: OMNIPAQUE IOHEXOL 300 MG/ML IV SOLN  Comparison:  Prior chest radiographs ranging from 10/01/2010 through 12/04/2010.  Findings:  The pulmonary arteries are well opacified with contrast. There is no evidence of  acute pulmonary embolism.  Minimal aortic atherosclerosis is present.  There are several mildly enlarged mediastinal and hilar lymph nodes.  A precarinal node measures 12 mm on image 44.  There is a 15 mm right hilar node on image 48.  A small amount of pleural fluid is present on the left.  There is no significant right pleural or pericardial effusion.  There are mild emphysematous changes.  Patchy pulmonary opacities are present in both lungs as demonstrated on recent prior radiographs.  Greatest involvement is present in the superior segments of both lower lobes and within the lingula.  There is no well-defined mass or endobronchial lesion.  The questioned increased density at the right lung base on today's radiographs appears to be due to healing fractures of the right fifth and sixth ribs anteriorly.  There also healing fractures of the lower left ribs posteriorly.  No pneumothorax is demonstrated.  The visualized upper abdomen appears unremarkable.  Review of the MIP images confirms the above findings.  IMPRESSION:  1.  No evidence of acute pulmonary embolism. 2.  Improving bilateral air space opacities, likely reflecting resolving pneumonia superimposed on chronic lung disease. Continued radiographic followup is recommended. 3.  Nonspecific mediastinal and hilar adenopathy may be reactive to suspected pneumonia.  A neoplastic process cannot be excluded and CT followup in 3 - 6 months suggested. 4.  Healing rib fractures bilaterally account for the questioned nodularity at the right lung base on today's radiographs.  Original Report Authenticated By: Gerrianne Scale, M.D.     No diagnosis found.    MDM  Care assumed from [facility].  I agree with her assessment and plan as above.  The patient has a history of MR and lives in a group home.  He has been having episodes of shortness of breath, passing out.  The ambulance has been there three times in the past 5 days.  The director of the group  home is with the patient who states they cannot care for his medical needs and wants him admitted.  The patient is unable to add any history due to MR.    The patient was evaluated by Dr. Mikeal Hawthorne from Internal Medicine and he stated that he would be unable to justify medical admission for this patient to the hospital.  I again spoke with the patient's caretaker who did not believe she could have the patient back at the group home.  At this point, I will consult the act team to discuss the situation.        Geoffery Lyons, MD 12/05/10  0646 

## 2010-12-05 NOTE — ED Notes (Signed)
Patient alert sitting up on stretcher. Wanting coffee. Coffee given

## 2010-12-08 ENCOUNTER — Emergency Department: Payer: Self-pay | Admitting: Emergency Medicine

## 2010-12-27 ENCOUNTER — Inpatient Hospital Stay: Payer: Self-pay | Admitting: Internal Medicine

## 2011-01-04 LAB — COMPREHENSIVE METABOLIC PANEL
Anion Gap: 10 (ref 7–16)
Bilirubin,Total: 0.2 mg/dL (ref 0.2–1.0)
Chloride: 107 mmol/L (ref 98–107)
Co2: 28 mmol/L (ref 21–32)
Creatinine: 0.61 mg/dL (ref 0.60–1.30)
EGFR (African American): >60
EGFR (Non-African Amer.): >60
Glucose: 150 mg/dL — ABNORMAL HIGH (ref 65–99)
Osmolality: 291 (ref 275–301)
Potassium: 3.9 mmol/L (ref 3.5–5.1)
SGPT (ALT): 14 U/L
Total Protein: 6.4 g/dL (ref 6.4–8.2)

## 2011-01-04 LAB — DRUG SCREEN, URINE
Amphetamines, Ur Screen: NEGATIVE (ref ?–1000)
Benzodiazepine, Ur Scrn: NEGATIVE (ref ?–200)
Cannabinoid 50 Ng, Ur ~~LOC~~: NEGATIVE (ref ?–50)
Cocaine Metabolite,Ur ~~LOC~~: NEGATIVE (ref ?–300)
MDMA (Ecstasy)Ur Screen: NEGATIVE (ref ?–500)
Methadone, Ur Screen: NEGATIVE (ref ?–300)
Opiate, Ur Screen: NEGATIVE (ref ?–300)
Tricyclic, Ur Screen: NEGATIVE (ref ?–1000)

## 2011-01-04 LAB — URINALYSIS, COMPLETE
Bacteria: NONE SEEN
Blood: NEGATIVE
Glucose,UR: NEGATIVE mg/dL (ref 0–75)
Protein: NEGATIVE
RBC,UR: NONE SEEN /HPF (ref 0–5)
Specific Gravity: 1.003 (ref 1.003–1.030)
Squamous Epithelial: NONE SEEN
WBC UR: NONE SEEN /HPF (ref 0–5)

## 2011-01-04 LAB — CBC
HCT: 35.5 % — ABNORMAL LOW (ref 40.0–52.0)
HGB: 11.7 g/dL — ABNORMAL LOW (ref 13.0–18.0)
WBC: 8.2 10*3/uL (ref 3.8–10.6)

## 2011-01-04 LAB — ACETAMINOPHEN LEVEL: Acetaminophen: <2 ug/mL

## 2011-01-05 ENCOUNTER — Inpatient Hospital Stay: Payer: Self-pay | Admitting: Psychiatry

## 2011-01-05 NOTE — ED Provider Notes (Signed)
Medical screening examination/treatment/procedure(s) were performed by non-physician practitioner and as supervising physician I was immediately available for consultation/collaboration.   Forbes Cellar, MD 01/05/11 0028

## 2011-01-12 LAB — LITHIUM LEVEL: Lithium: 0.48 mmol/L — ABNORMAL LOW

## 2011-01-13 LAB — LITHIUM LEVEL: Lithium: 0.35 mmol/L — ABNORMAL LOW

## 2011-01-14 LAB — LITHIUM LEVEL: Lithium: <0.2 mmol/L — ABNORMAL LOW

## 2011-01-29 ENCOUNTER — Encounter (HOSPITAL_COMMUNITY): Payer: Self-pay | Admitting: Emergency Medicine

## 2011-01-29 ENCOUNTER — Emergency Department (HOSPITAL_COMMUNITY)
Admission: EM | Admit: 2011-01-29 | Discharge: 2011-01-29 | Disposition: A | Discharge status: Home / Self Care | Payer: Medicare Other | Attending: Emergency Medicine | Admitting: Emergency Medicine

## 2011-01-29 ENCOUNTER — Emergency Department (HOSPITAL_COMMUNITY): Payer: Medicare Other

## 2011-01-29 DIAGNOSIS — R0602 Shortness of breath: Secondary | ICD-10-CM | POA: Insufficient documentation

## 2011-01-29 DIAGNOSIS — I10 Essential (primary) hypertension: Secondary | ICD-10-CM | POA: Insufficient documentation

## 2011-01-29 DIAGNOSIS — K920 Hematemesis: Secondary | ICD-10-CM | POA: Insufficient documentation

## 2011-01-29 DIAGNOSIS — R062 Wheezing: Secondary | ICD-10-CM | POA: Insufficient documentation

## 2011-01-29 DIAGNOSIS — F79 Unspecified intellectual disabilities: Secondary | ICD-10-CM | POA: Insufficient documentation

## 2011-01-29 DIAGNOSIS — J4489 Other specified chronic obstructive pulmonary disease: Secondary | ICD-10-CM | POA: Insufficient documentation

## 2011-01-29 DIAGNOSIS — R112 Nausea with vomiting, unspecified: Secondary | ICD-10-CM

## 2011-01-29 DIAGNOSIS — F17200 Nicotine dependence, unspecified, uncomplicated: Secondary | ICD-10-CM | POA: Insufficient documentation

## 2011-01-29 DIAGNOSIS — J449 Chronic obstructive pulmonary disease, unspecified: Secondary | ICD-10-CM | POA: Insufficient documentation

## 2011-01-29 HISTORY — DX: Disorder of thyroid, unspecified: E07.9

## 2011-01-29 HISTORY — DX: Chronic obstructive pulmonary disease, unspecified: J44.9

## 2011-01-29 HISTORY — DX: Generalized anxiety disorder: F41.1

## 2011-01-29 HISTORY — DX: Benign prostatic hyperplasia with lower urinary tract symptoms: N40.1

## 2011-01-29 HISTORY — DX: Essential (primary) hypertension: I10

## 2011-01-29 HISTORY — DX: Intermittent explosive disorder: F63.81

## 2011-01-29 HISTORY — DX: Benign prostatic hyperplasia with lower urinary tract symptoms: N13.8

## 2011-01-29 HISTORY — DX: Blindness, one eye, unspecified eye: H54.40

## 2011-01-29 HISTORY — DX: Mild intellectual disabilities: F70

## 2011-01-29 HISTORY — DX: Peptic ulcer, site unspecified, unspecified as acute or chronic, without hemorrhage or perforation: K27.9

## 2011-01-29 HISTORY — DX: Mood disorder due to known physiological condition, unspecified: F06.30

## 2011-01-29 LAB — CBC
HCT: 37.1 % — ABNORMAL LOW (ref 39.0–52.0)
Hemoglobin: 12.2 g/dL — ABNORMAL LOW (ref 13.0–17.0)
MCH: 26.9 pg (ref 26.0–34.0)
MCHC: 32.9 g/dL (ref 30.0–36.0)
MCV: 81.9 fL (ref 78.0–100.0)
RBC: 4.53 MIL/uL (ref 4.22–5.81)

## 2011-01-29 LAB — DIFFERENTIAL
Basophils Relative: 1 % (ref 0–1)
Eosinophils Absolute: 0.8 10*3/uL — ABNORMAL HIGH (ref 0.0–0.7)
Lymphs Abs: 1.3 10*3/uL (ref 0.7–4.0)
Monocytes Absolute: 0.6 10*3/uL (ref 0.1–1.0)
Monocytes Relative: 12 % (ref 3–12)
Neutrophils Relative %: 47 % (ref 43–77)

## 2011-01-29 LAB — POCT I-STAT, CHEM 8
BUN: 7 mg/dL (ref 6–23)
Creatinine, Ser: 0.8 mg/dL (ref 0.50–1.35)
Potassium: 4 mEq/L (ref 3.5–5.1)
Sodium: 142 mEq/L (ref 135–145)

## 2011-01-29 MED ORDER — PANTOPRAZOLE SODIUM 40 MG IV SOLR
40.0000 mg | Freq: Once | INTRAVENOUS | Status: AC
  Administered 2011-01-29: 40 mg via INTRAVENOUS
  Filled 2011-01-29: qty 40

## 2011-01-29 MED ORDER — ONDANSETRON HCL 4 MG/2ML IJ SOLN
4.0000 mg | Freq: Once | INTRAMUSCULAR | Status: AC
Start: 2011-01-29 — End: 2011-01-29
  Administered 2011-01-29: 4 mg via INTRAVENOUS
  Filled 2011-01-29: qty 2

## 2011-01-29 MED ORDER — IPRATROPIUM BROMIDE 0.02 % IN SOLN
0.5000 mg | Freq: Once | RESPIRATORY_TRACT | Status: AC
  Administered 2011-01-29: 0.5 mg via RESPIRATORY_TRACT
  Filled 2011-01-29: qty 2.5

## 2011-01-29 MED ORDER — ONDANSETRON 8 MG PO TBDP: ORAL_TABLET | ORAL | Status: AC

## 2011-01-29 MED ORDER — ALBUTEROL SULFATE (5 MG/ML) 0.5% IN NEBU
5.0000 mg | INHALATION_SOLUTION | Freq: Once | RESPIRATORY_TRACT | Status: AC
  Administered 2011-01-29: 5 mg via RESPIRATORY_TRACT
  Filled 2011-01-29: qty 1

## 2011-01-29 NOTE — ED Provider Notes (Signed)
Medical screening examination/treatment/procedure(s) were conducted as a shared visit with non-physician practitioner(s) and myself.  I personally evaluated the patient during the encounter  Pt feeling better at this time.  Suspect he might have had a small MW tear.  No further vomiting here.  Labs are reassuring.  Will dc home with antiemetic.  Follow up with PCP this week.  Discussed precautions with caregiver.  Celene Kras, MD 01/29/11 [phone]

## 2011-01-29 NOTE — ED Notes (Signed)
Pt states that while eating supper he started to feel nauseas, pt there after had x2 episode of emesis and states that also noted minimal amount of blood, pt states he was not able to finish his supper. Pt states "i feel funny"

## 2011-01-29 NOTE — ED Provider Notes (Signed)
History     CSN: [id number]  Arrival date & time 01/29/11  4098   First MD Initiated Contact with Patient 01/29/11 0211      Chief Complaint  Patient presents with  . Nausea  . Emesis  . Chills  . Hematemesis     HPI  History provided by the patient and care provider. Patient is a 56 year old male with history of schizoaffective disorder, COPD, mild mental retarded aeration, peptic ulcer disease who presents with complaints of 2 episodes of vomiting with hematemesis last evening. Patient reports he began to feel nauseated with irritation in his abdomen while sitting at the dinner table. He had an episode of a small amount of vomiting with blood. Patient then went to the bathroom where he had a much larger episode of vomiting with bright red blood. Patient has since reported feeling some improvement of nausea. She was otherwise feeling well yesterday. He denies any fever, chills, sweats. Patient denies any diarrhea or constipation. Patient does report having some dark black stools recently.     Past Medical History  Diagnosis Date  . Psychiatric disorder   . Schizoaffective disorder   . Organic mood disorder   . Intermittent explosive disorder   . Generalized anxiety disorder   . Mild mental retardation   . COPD (chronic obstructive pulmonary disease)   . Hypertension   . Peptic ulcer   . Thyroid disease   . Prostate hyperplasia with urinary obstruction   . Blind left eye     History reviewed. No pertinent past surgical history.  History reviewed. No pertinent family history.  History  Substance Use Topics  . Smoking status: Current Everyday Smoker -- 0.2 packs/day    Types: Cigarettes  . Smokeless tobacco: Never Used  . Alcohol Use: No      Review of Systems  Constitutional: Negative for fever, chills and appetite change.  Respiratory: Negative for cough and shortness of breath.   Gastrointestinal: Positive for nausea and vomiting. Negative for abdominal pain,  diarrhea and constipation.  Neurological: Negative for dizziness, syncope, weakness and light-headedness.  All other systems reviewed and are negative.    Allergies  Chlorpromazine; Fluphenazine; Lactose intolerance (gi); Peanut-containing drug products; Prolixin; and Thorazine  Home Medications   Current Outpatient Rx  Name Route Sig Dispense Refill  . ALBUTEROL SULFATE HFA 108 (90 BASE) MCG/ACT IN AERS Inhalation Inhale 1 puff into the lungs every 6 (six) hours as needed. Shortness of breath     . IPRATROPIUM-ALBUTEROL 18-103 MCG/ACT IN AERO Inhalation Inhale 2 puffs into the lungs every 6 (six) hours as needed. Shortness of breath.     . AMLODIPINE BESYLATE 10 MG PO TABS Oral Take 10 mg by mouth daily.      Marland Kitchen DIPHENHYDRAMINE HCL 50 MG PO CAPS Oral Take 50 mg by mouth every 6 (six) hours as needed. For insomnia    . DOCUSATE SODIUM 50 MG/5ML PO LIQD Oral Take 50 mg by mouth daily.      Marland Kitchen LEVOTHYROXINE SODIUM 75 MCG PO TABS Oral Take 75 mcg by mouth daily.      Marland Kitchen LISINOPRIL 5 MG PO TABS Oral Take 5 mg by mouth daily.      Marland Kitchen OMEPRAZOLE 20 MG PO CPDR Oral Take 20 mg by mouth 2 (two) times daily.      Marland Kitchen POTASSIUM CHLORIDE 20 MEQ PO PACK Oral Take 20 mEq by mouth daily.      Marland Kitchen RISPERIDONE 1 MG PO TABS Oral Take  1 mg by mouth daily.      Marland Kitchen RISPERIDONE 2 MG PO TABS Oral Take 2 mg by mouth at bedtime.      . TAMSULOSIN HCL 0.4 MG PO CAPS Oral Take 0.4 mg by mouth daily after supper.      Marland Kitchen TEMAZEPAM 30 MG PO CAPS Oral Take 30 mg by mouth at bedtime.      . TRAZODONE HCL 100 MG PO TABS Oral Take 100 mg by mouth at bedtime.        BP 133/91  Pulse 65  Temp(Src) 98 F (36.7 C) (Oral)  Resp 16  SpO2 93%  Physical Exam  Nursing note and vitals reviewed. Constitutional: He is oriented to person, place, and time. He appears well-developed and well-nourished.  HENT:  Head: Normocephalic and atraumatic.  Nose: Nose normal.  Mouth/Throat: Oropharynx is clear and moist.  Cardiovascular:  Normal rate and regular rhythm.   No murmur heard. Pulmonary/Chest: Effort normal. No respiratory distress. He has wheezes. He has rales.  Abdominal: Soft. He exhibits no distension. There is no tenderness. There is no rebound and no guarding.  Genitourinary: Rectum normal.  Neurological: He is alert and oriented to person, place, and time.  Skin: Skin is warm. There is pallor.    ED Course  Procedures     Labs Reviewed  CBC  DIFFERENTIAL  POCT OCCULT BLOOD STOOL, DEVICE  I-STAT, CHEM 8   Results for orders placed during the hospital encounter of 01/29/11  CBC      Component Value Range   WBC 5.2  4.0 - 10.5 (K/uL)   RBC 4.53  4.22 - 5.81 (MIL/uL)   Hemoglobin 12.2 (*) 13.0 - 17.0 (g/dL)   HCT 40.9 (*) 81.1 - 52.0 (%)   MCV 81.9  78.0 - 100.0 (fL)   MCH 26.9  26.0 - 34.0 (pg)   MCHC 32.9  30.0 - 36.0 (g/dL)   RDW 91.4  78.2 - 95.6 (%)   Platelets 241  150 - 400 (K/uL)  DIFFERENTIAL      Component Value Range   Neutrophils Relative 47  43 - 77 (%)   Neutro Abs 2.4  1.7 - 7.7 (K/uL)   Lymphocytes Relative 25  12 - 46 (%)   Lymphs Abs 1.3  0.7 - 4.0 (K/uL)   Monocytes Relative 12  3 - 12 (%)   Monocytes Absolute 0.6  0.1 - 1.0 (K/uL)   Eosinophils Relative 16 (*) 0 - 5 (%)   Eosinophils Absolute 0.8 (*) 0.0 - 0.7 (K/uL)   Basophils Relative 1  0 - 1 (%)   Basophils Absolute 0.1  0.0 - 0.1 (K/uL)  OCCULT BLOOD, POC DEVICE      Component Value Range   Fecal Occult Bld NEGATIVE    POCT I-STAT, CHEM 8      Component Value Range   Sodium 142  135 - 145 (mEq/L)   Potassium 4.0  3.5 - 5.1 (mEq/L)   Chloride 103  96 - 112 (mEq/L)   BUN 7  6 - 23 (mg/dL)   Creatinine, Ser 2.13  0.50 - 1.35 (mg/dL)   Glucose, Bld 086 (*) 70 - 99 (mg/dL)   Calcium, Ion 5.78  4.69 - 1.32 (mmol/L)   TCO2 27  0 - 100 (mmol/L)   Hemoglobin 12.9 (*) 13.0 - 17.0 (g/dL)   HCT 62.9 (*) 52.8 - 52.0 (%)     Dg Chest 2 View  01/29/2011  *RADIOLOGY REPORT*  Clinical Data:  Cough and shortness of  breath  CHEST - 2 VIEW  Comparison: 12/04/2010  Findings: Normal heart size and pulmonary vascularity.  Fibrosis in the lungs.  Linear scarring in the left upper lung, stable since the prior study.  No focal airspace consolidation.  No blunting of costophrenic angles.  Old fracture deformity of the right posterior air sixth rib.  No pneumothorax.  IMPRESSION: No evidence of active pulmonary disease.  Original Report Authenticated By: Marlon Pel, M.D.     1. Nausea & vomiting   2. Hematemesis       MDM  2:15 AM patient seen and evaluated. Patient in no acute distress.  Patient continues to be stable. Orthostatic vital signs unremarkable the patient does complain of some dizziness with standing. Patient is having wheezing on exam and we'll give some breathing treatments here. Patient has known COPD.  Patient discussed with attending physician. Patient stable at this time with normal vital signs. Patient with no episodes of vomiting in the emergency room. Patient had improvement of his H&H compared to last month. Patient is guaiac negative. At this time differential may include Mallory-Weiss or possible bleeding ulcer, though patient does not have any abdominal pain or tenderness. Patient is felt to be stable to discharge home and have followup with primary care provider.      Angus Seller, [place] 01/29/11 [phone]

## 2011-01-31 ENCOUNTER — Encounter (HOSPITAL_COMMUNITY): Payer: Self-pay | Admitting: Emergency Medicine

## 2011-01-31 ENCOUNTER — Other Ambulatory Visit: Payer: Self-pay

## 2011-01-31 ENCOUNTER — Inpatient Hospital Stay (HOSPITAL_COMMUNITY)
Admission: EM | Admit: 2011-01-31 | Discharge: 2011-02-05 | DRG: 192 | Disposition: A | Discharge status: Home / Self Care | Payer: Medicare Other | Attending: Internal Medicine | Admitting: Internal Medicine

## 2011-01-31 ENCOUNTER — Emergency Department (HOSPITAL_COMMUNITY): Payer: Medicare Other

## 2011-01-31 DIAGNOSIS — Z720 Tobacco use: Secondary | ICD-10-CM

## 2011-01-31 DIAGNOSIS — F17200 Nicotine dependence, unspecified, uncomplicated: Secondary | ICD-10-CM | POA: Diagnosis present

## 2011-01-31 DIAGNOSIS — E039 Hypothyroidism, unspecified: Secondary | ICD-10-CM

## 2011-01-31 DIAGNOSIS — N400 Enlarged prostate without lower urinary tract symptoms: Secondary | ICD-10-CM

## 2011-01-31 DIAGNOSIS — R0789 Other chest pain: Secondary | ICD-10-CM | POA: Diagnosis present

## 2011-01-31 DIAGNOSIS — R569 Unspecified convulsions: Secondary | ICD-10-CM

## 2011-01-31 DIAGNOSIS — J441 Chronic obstructive pulmonary disease with (acute) exacerbation: Secondary | ICD-10-CM | POA: Diagnosis present

## 2011-01-31 DIAGNOSIS — I10 Essential (primary) hypertension: Secondary | ICD-10-CM | POA: Diagnosis present

## 2011-01-31 DIAGNOSIS — K219 Gastro-esophageal reflux disease without esophagitis: Secondary | ICD-10-CM | POA: Diagnosis present

## 2011-01-31 DIAGNOSIS — F259 Schizoaffective disorder, unspecified: Secondary | ICD-10-CM

## 2011-01-31 DIAGNOSIS — R079 Chest pain, unspecified: Secondary | ICD-10-CM | POA: Diagnosis present

## 2011-01-31 DIAGNOSIS — F70 Mild intellectual disabilities: Secondary | ICD-10-CM

## 2011-01-31 DIAGNOSIS — J8410 Pulmonary fibrosis, unspecified: Secondary | ICD-10-CM | POA: Diagnosis present

## 2011-01-31 LAB — CBC
Hemoglobin: 11.2 g/dL — ABNORMAL LOW (ref 13.0–17.0)
MCH: 26.3 pg (ref 26.0–34.0)
MCV: 80.8 fL (ref 78.0–100.0)
RBC: 4.26 MIL/uL (ref 4.22–5.81)

## 2011-01-31 MED ORDER — ALBUTEROL SULFATE (5 MG/ML) 0.5% IN NEBU
2.5000 mg | INHALATION_SOLUTION | Freq: Once | RESPIRATORY_TRACT | Status: AC
  Administered 2011-01-31: 2.5 mg via RESPIRATORY_TRACT
  Filled 2011-01-31: qty 0.5

## 2011-01-31 MED ORDER — METHYLPREDNISOLONE SODIUM SUCC 125 MG IJ SOLR
125.0000 mg | Freq: Once | INTRAMUSCULAR | Status: AC
  Administered 2011-01-31: 125 mg via INTRAVENOUS

## 2011-01-31 MED ORDER — SODIUM CHLORIDE 0.9 % IV SOLN
1000.0000 mg | Freq: Once | INTRAVENOUS | Status: AC
  Administered 2011-01-31: 1000 mg via INTRAVENOUS
  Filled 2011-01-31: qty 10

## 2011-01-31 MED ORDER — IPRATROPIUM BROMIDE 0.02 % IN SOLN: 0.5000 mg | Freq: Once | RESPIRATORY_TRACT | Status: DC

## 2011-01-31 NOTE — ED Notes (Signed)
Pt had another seizure upon RN walking in for assessment. MD aware.

## 2011-01-31 NOTE — ED Notes (Signed)
ZOX:[id number]<AV> Expected date:01/31/11<BR> Expected time: 9:47 PM<BR> Means of arrival:Ambulance<BR> Comments:<BR> EMS 50 GC - sob/bronchitis

## 2011-01-31 NOTE — ED Notes (Signed)
Pt. Had witnessed seizure activity.  He shook all over and his eyes rolled back in his head.  He was incontinent of urine.  Dr. Preston Fleeting notified.  1GM Keppra ordered IV from pharmacy.

## 2011-01-31 NOTE — ED Notes (Signed)
MD at bedside. 

## 2011-01-31 NOTE — ED Notes (Signed)
Pt c/o pneumonia and bronchitis for one month. Worse difficulty breathing today. Pt called EMS from a booth at a [employer]. Pt a/o. No acute distress. Pt given one 5mg  Albuterol neb per EMS.

## 2011-02-01 ENCOUNTER — Encounter (HOSPITAL_COMMUNITY): Payer: Self-pay | Admitting: Family Medicine

## 2011-02-01 ENCOUNTER — Inpatient Hospital Stay (HOSPITAL_COMMUNITY)
Admit: 2011-02-01 | Discharge: 2011-02-01 | Disposition: A | Discharge status: Home / Self Care | Payer: Medicare Other | Attending: Family Medicine | Admitting: Family Medicine

## 2011-02-01 DIAGNOSIS — J441 Chronic obstructive pulmonary disease with (acute) exacerbation: Secondary | ICD-10-CM | POA: Diagnosis present

## 2011-02-01 DIAGNOSIS — F259 Schizoaffective disorder, unspecified: Secondary | ICD-10-CM

## 2011-02-01 DIAGNOSIS — N400 Enlarged prostate without lower urinary tract symptoms: Secondary | ICD-10-CM

## 2011-02-01 DIAGNOSIS — E039 Hypothyroidism, unspecified: Secondary | ICD-10-CM

## 2011-02-01 DIAGNOSIS — F70 Mild intellectual disabilities: Secondary | ICD-10-CM

## 2011-02-01 DIAGNOSIS — R079 Chest pain, unspecified: Secondary | ICD-10-CM | POA: Diagnosis present

## 2011-02-01 LAB — BASIC METABOLIC PANEL
CO2: 25 mEq/L (ref 19–32)
Calcium: 9.4 mg/dL (ref 8.4–10.5)
Creatinine, Ser: 0.81 mg/dL (ref 0.50–1.35)
Glucose, Bld: 97 mg/dL (ref 70–99)

## 2011-02-01 LAB — CBC
Hemoglobin: 11.6 g/dL — ABNORMAL LOW (ref 13.0–17.0)
MCH: 26.9 pg (ref 26.0–34.0)
MCHC: 33.1 g/dL (ref 30.0–36.0)
MCV: 81.2 fL (ref 78.0–100.0)

## 2011-02-01 LAB — DIFFERENTIAL
Basophils Relative: 1 % (ref 0–1)
Eosinophils Relative: 12 % — ABNORMAL HIGH (ref 0–5)
Lymphs Abs: 1.8 10*3/uL (ref 0.7–4.0)
Monocytes Absolute: 0.7 10*3/uL (ref 0.1–1.0)

## 2011-02-01 LAB — EXPECTORATED SPUTUM ASSESSMENT W GRAM STAIN, RFLX TO RESP C

## 2011-02-01 LAB — CK TOTAL AND CKMB (NOT AT ARMC)
CK, MB: 3.1 ng/mL (ref 0.3–4.0)
Total CK: 48 U/L (ref 7–232)

## 2011-02-01 LAB — CREATININE, SERUM: Creatinine, Ser: 0.78 mg/dL (ref 0.50–1.35)

## 2011-02-01 LAB — LITHIUM LEVEL: Lithium Lvl: <0.25 mEq/L — ABNORMAL LOW (ref 0.80–1.40)

## 2011-02-01 LAB — TROPONIN I: Troponin I: <0.3 ng/mL (ref <0.30)

## 2011-02-01 MED ORDER — IPRATROPIUM BROMIDE 0.02 % IN SOLN
0.5000 mg | RESPIRATORY_TRACT | Status: DC | PRN
  Administered 2011-02-01 – 2011-02-05 (×6): 0.5 mg via RESPIRATORY_TRACT
  Filled 2011-02-01 (×7): qty 2.5

## 2011-02-01 MED ORDER — ENOXAPARIN SODIUM 40 MG/0.4ML ~~LOC~~ SOLN
40.0000 mg | SUBCUTANEOUS | Status: DC
Start: 2011-02-01 — End: 2011-02-05
  Administered 2011-02-01 – 2011-02-05 (×5): 40 mg via SUBCUTANEOUS
  Filled 2011-02-01 (×6): qty 0.4

## 2011-02-01 MED ORDER — HYDROCODONE-ACETAMINOPHEN 5-325 MG PO TABS
1.0000 | ORAL_TABLET | Freq: Four times a day (QID) | ORAL | Status: DC | PRN
  Administered 2011-02-01 – 2011-02-04 (×2): 1 via ORAL
  Filled 2011-02-01 (×3): qty 1

## 2011-02-01 MED ORDER — VITAMINS A & D EX OINT
TOPICAL_OINTMENT | CUTANEOUS | Status: AC
  Administered 2011-02-01: 22:00:00
  Filled 2011-02-01: qty 5

## 2011-02-01 MED ORDER — SODIUM CHLORIDE 0.9 % IV SOLN
500.0000 mg | Freq: Two times a day (BID) | INTRAVENOUS | Status: DC
  Administered 2011-02-01 – 2011-02-03 (×3): 500 mg via INTRAVENOUS
  Filled 2011-02-01 (×6): qty 5

## 2011-02-01 MED ORDER — RISPERIDONE 1 MG PO TABS
1.0000 mg | ORAL_TABLET | Freq: Every day | ORAL | Status: DC
  Administered 2011-02-01 – 2011-02-02 (×2): 1 mg via ORAL
  Filled 2011-02-01 (×3): qty 1

## 2011-02-01 MED ORDER — POTASSIUM CHLORIDE 20 MEQ PO PACK: 20.0000 meq | PACK | Freq: Every day | ORAL | Status: DC

## 2011-02-01 MED ORDER — DOCUSATE SODIUM 50 MG/5ML PO LIQD
50.0000 mg | Freq: Every day | ORAL | Status: DC
  Administered 2011-02-01 – 2011-02-05 (×5): 50 mg via ORAL
  Filled 2011-02-01 (×6): qty 10

## 2011-02-01 MED ORDER — LISINOPRIL 5 MG PO TABS
5.0000 mg | ORAL_TABLET | Freq: Every day | ORAL | Status: DC
  Administered 2011-02-01 – 2011-02-05 (×5): 5 mg via ORAL
  Filled 2011-02-01 (×6): qty 1

## 2011-02-01 MED ORDER — TRAZODONE HCL 100 MG PO TABS
100.0000 mg | ORAL_TABLET | Freq: Every day | ORAL | Status: DC
  Administered 2011-02-01 – 2011-02-04 (×4): 100 mg via ORAL
  Filled 2011-02-01 (×6): qty 1

## 2011-02-01 MED ORDER — RISPERIDONE 2 MG PO TABS
2.0000 mg | ORAL_TABLET | Freq: Every day | ORAL | Status: DC
  Administered 2011-02-01 – 2011-02-04 (×4): 2 mg via ORAL
  Filled 2011-02-01 (×8): qty 1

## 2011-02-01 MED ORDER — NICOTINE 14 MG/24HR TD PT24
14.0000 mg | MEDICATED_PATCH | Freq: Every day | TRANSDERMAL | Status: DC
  Administered 2011-02-01 – 2011-02-05 (×6): 14 mg via TRANSDERMAL
  Filled 2011-02-01 (×6): qty 1

## 2011-02-01 MED ORDER — POTASSIUM CHLORIDE CRYS ER 20 MEQ PO TBCR
20.0000 meq | EXTENDED_RELEASE_TABLET | Freq: Every day | ORAL | Status: DC
  Administered 2011-02-01 – 2011-02-05 (×5): 20 meq via ORAL
  Filled 2011-02-01 (×6): qty 1

## 2011-02-01 MED ORDER — AMLODIPINE BESYLATE 10 MG PO TABS
10.0000 mg | ORAL_TABLET | Freq: Every day | ORAL | Status: DC
  Administered 2011-02-01 – 2011-02-05 (×5): 10 mg via ORAL
  Filled 2011-02-01 (×6): qty 1

## 2011-02-01 MED ORDER — TAMSULOSIN HCL 0.4 MG PO CAPS
0.4000 mg | ORAL_CAPSULE | ORAL | Status: DC
  Administered 2011-02-01 – 2011-02-04 (×4): 0.4 mg via ORAL
  Filled 2011-02-01 (×5): qty 1

## 2011-02-01 MED ORDER — PNEUMOCOCCAL VAC POLYVALENT 25 MCG/0.5ML IJ INJ
0.5000 mL | INJECTION | INTRAMUSCULAR | Status: AC
  Administered 2011-02-02: 0.5 mL via INTRAMUSCULAR
  Filled 2011-02-01: qty 0.5

## 2011-02-01 MED ORDER — METHYLPREDNISOLONE SODIUM SUCC 40 MG IJ SOLR
40.0000 mg | Freq: Two times a day (BID) | INTRAMUSCULAR | Status: DC
  Administered 2011-02-01 – 2011-02-02 (×3): 40 mg via INTRAVENOUS
  Filled 2011-02-01 (×6): qty 1

## 2011-02-01 MED ORDER — NITROGLYCERIN 0.4 MG SL SUBL: 0.4000 mg | SUBLINGUAL_TABLET | SUBLINGUAL | Status: DC | PRN

## 2011-02-01 MED ORDER — PANTOPRAZOLE SODIUM 40 MG PO TBEC
40.0000 mg | DELAYED_RELEASE_TABLET | Freq: Every day | ORAL | Status: DC
  Administered 2011-02-01 – 2011-02-05 (×5): 40 mg via ORAL
  Filled 2011-02-01 (×6): qty 1

## 2011-02-01 MED ORDER — DIPHENHYDRAMINE HCL 50 MG PO CAPS
50.0000 mg | ORAL_CAPSULE | Freq: Four times a day (QID) | ORAL | Status: DC | PRN
  Administered 2011-02-01 – 2011-02-02 (×2): 50 mg via ORAL
  Filled 2011-02-01 (×3): qty 1

## 2011-02-01 MED ORDER — ASPIRIN 325 MG PO TABS
325.0000 mg | ORAL_TABLET | Freq: Every day | ORAL | Status: DC
  Administered 2011-02-01 – 2011-02-05 (×5): 325 mg via ORAL
  Filled 2011-02-01 (×6): qty 1

## 2011-02-01 MED ORDER — HALOPERIDOL 2 MG PO TABS
2.0000 mg | ORAL_TABLET | Freq: Four times a day (QID) | ORAL | Status: DC | PRN
  Administered 2011-02-01 – 2011-02-04 (×2): 2 mg via ORAL
  Filled 2011-02-01 (×3): qty 1

## 2011-02-01 MED ORDER — ONDANSETRON 4 MG PO TBDP
4.0000 mg | ORAL_TABLET | Freq: Three times a day (TID) | ORAL | Status: DC | PRN
  Filled 2011-02-01: qty 1

## 2011-02-01 MED ORDER — SODIUM CHLORIDE 0.9 % IJ SOLN
3.0000 mL | INTRAMUSCULAR | Status: DC | PRN
  Administered 2011-02-02: 3 mL via INTRAVENOUS

## 2011-02-01 MED ORDER — LEVOTHYROXINE SODIUM 75 MCG PO TABS
75.0000 ug | ORAL_TABLET | Freq: Every day | ORAL | Status: DC
  Administered 2011-02-01 – 2011-02-05 (×5): 75 ug via ORAL
  Filled 2011-02-01 (×6): qty 1

## 2011-02-01 MED ORDER — ALBUTEROL SULFATE (5 MG/ML) 0.5% IN NEBU
2.5000 mg | INHALATION_SOLUTION | RESPIRATORY_TRACT | Status: DC | PRN
  Administered 2011-02-01 – 2011-02-05 (×6): 2.5 mg via RESPIRATORY_TRACT
  Filled 2011-02-01 (×7): qty 0.5

## 2011-02-01 MED ORDER — LITHIUM CARBONATE 300 MG PO CAPS
450.0000 mg | ORAL_CAPSULE | Freq: Every day | ORAL | Status: DC
  Administered 2011-02-01 – 2011-02-02 (×2): 450 mg via ORAL
  Filled 2011-02-01 (×3): qty 1

## 2011-02-01 NOTE — Progress Notes (Signed)
EEG completed.

## 2011-02-01 NOTE — Progress Notes (Signed)
Nutrition Brief Note:  Consult received for nutrition education.  Pt mildly retarded, lives in a ?group home with a 1:1 caregiver.  He is unable to participate in a nutrition care plan at this time.  Caregiver not at bedside to discussed nutrition goals or warranted therapy. Pt is eating well, PO 100% of Heart Healthy meals since admission. MD- please reconsult and clarify if nutrition therapy desired for pt.  Pager:  [phone]

## 2011-02-01 NOTE — ED Provider Notes (Signed)
History     CSN: [id number]  Arrival date & time 01/31/11  2158   First MD Initiated Contact with Patient 01/31/11 2212      Chief Complaint  Patient presents with  . Respiratory Distress    (Consider location/radiation/quality/duration/timing/severity/associated sxs/prior treatment) The history is provided by the patient. The history is limited by the condition of the patient (Mental retardation).   56 year old male is brought in by EMS because of difficulty breathing. His history is limited related to a psychiatric history and mental retardation. He apparently has been having difficulty breathing yesterday and today. He has had a cough but is not able to tell me if it has been productive. He denies any fever or chills. He has had some chest tightness. There's been no nausea or vomiting. Nursing history states that the EMS call was from a booth at [employer]. EMS gave him a breathing treatment with albuterol en route.  Past Medical History  Diagnosis Date  . Psychiatric disorder   . Schizoaffective disorder   . Organic mood disorder   . Intermittent explosive disorder   . Generalized anxiety disorder   . Mild mental retardation   . COPD (chronic obstructive pulmonary disease)   . Hypertension   . Peptic ulcer   . Thyroid disease   . Prostate hyperplasia with urinary obstruction   . Blind left eye     History reviewed. No pertinent past surgical history.  No family history on file.  History  Substance Use Topics  . Smoking status: Current Everyday Smoker -- 0.2 packs/day    Types: Cigarettes  . Smokeless tobacco: Never Used  . Alcohol Use: No      Review of Systems  Unable to perform ROS: Psychiatric disorder    Allergies  Chlorpromazine; Fluphenazine; Lactose intolerance (gi); Peanut-containing drug products; Prolixin; and Thorazine  Home Medications   Current Outpatient Rx  Name Route Sig Dispense Refill  . ALBUTEROL SULFATE HFA 108 (90 BASE)  MCG/ACT IN AERS Inhalation Inhale 1 puff into the lungs every 6 (six) hours as needed. Shortness of breath     . IPRATROPIUM-ALBUTEROL 18-103 MCG/ACT IN AERO Inhalation Inhale 2 puffs into the lungs every 6 (six) hours as needed. Shortness of breath.     . AMLODIPINE BESYLATE 10 MG PO TABS Oral Take 10 mg by mouth daily.      Marland Kitchen DIPHENHYDRAMINE HCL 50 MG PO CAPS Oral Take 50 mg by mouth every 6 (six) hours as needed. For insomnia    . DOCUSATE SODIUM 50 MG/5ML PO LIQD Oral Take 50 mg by mouth daily.      Marland Kitchen HALOPERIDOL 2 MG PO TABS Oral Take 2 mg by mouth every 6 (six) hours as needed. agitation    . HYDROCODONE-ACETAMINOPHEN 5-325 MG PO TABS Oral Take 1 tablet by mouth every 6 (six) hours as needed. pain    . LEVOFLOXACIN 500 MG PO TABS Oral Take 500 mg by mouth daily.    Marland Kitchen LEVOTHYROXINE SODIUM 75 MCG PO TABS Oral Take 75 mcg by mouth daily.      Marland Kitchen LISINOPRIL 5 MG PO TABS Oral Take 5 mg by mouth daily.      Marland Kitchen LITHIUM CARBONATE 300 MG PO CAPS Oral Take 450 mg by mouth daily.    Marland Kitchen OMEPRAZOLE 20 MG PO CPDR Oral Take 20 mg by mouth 2 (two) times daily.      Marland Kitchen ONDANSETRON 8 MG PO TBDP  8mg  ODT q4 hours prn nausea 20 tablet  0  . POTASSIUM CHLORIDE 20 MEQ PO PACK Oral Take 20 mEq by mouth daily.      Marland Kitchen RISPERIDONE 1 MG PO TABS Oral Take 1 mg by mouth daily.      Marland Kitchen RISPERIDONE 2 MG PO TABS Oral Take 2 mg by mouth at bedtime.      . TAMSULOSIN HCL 0.4 MG PO CAPS Oral Take 0.4 mg by mouth daily after supper.      . TRAZODONE HCL 100 MG PO TABS Oral Take 100 mg by mouth at bedtime.        BP 121/80  Pulse 75  Resp 28  SpO2 96%  Physical Exam  Nursing note and vitals reviewed.  56 year old male who is in mild respiratory distress. History rate is set tachypnea at 28 and remainder vital signs are normal. Oxygen saturation is 96% which is normal. Head is normocephalic and atraumatic. PERRLA, EOMI. Oropharynx is clear. Neck is nontender and supple without adenopathy or JVD or bruit. Back is nontender.  Lungs have diffuse coarse wheezes without rales or rhonchi. Heart has regular rate and rhythm without murmur. Abdomen is soft, flat, nontender without masses or hepatosplenomegaly. Peristalsis is present. Extremities no cyanosis or edema, full range of motion is present. Skin is warm and moist without rash. Neurologic: Speech is slow and delivered consistent with history of mental retardation. Cranial nerves are grossly intact and there no gross motor or sensory deficits.  ED Course  Procedures (including critical care time)  Results for orders placed during the hospital encounter of 01/31/11  CBC      Component Value Range   WBC 6.3  4.0 - 10.5 (K/uL)   RBC 4.26  4.22 - 5.81 (MIL/uL)   Hemoglobin 11.2 (*) 13.0 - 17.0 (g/dL)   HCT 29.5 (*) 28.4 - 52.0 (%)   MCV 80.8  78.0 - 100.0 (fL)   MCH 26.3  26.0 - 34.0 (pg)   MCHC 32.6  30.0 - 36.0 (g/dL)   RDW 13.2  44.0 - 10.2 (%)   Platelets 230  150 - 400 (K/uL)  DIFFERENTIAL      Component Value Range   Neutrophils Relative 47  43 - 77 (%)   Lymphocytes Relative 29  12 - 46 (%)   Monocytes Relative 11  3 - 12 (%)   Eosinophils Relative 12 (*) 0 - 5 (%)   Basophils Relative 1  0 - 1 (%)   Neutro Abs 2.9  1.7 - 7.7 (K/uL)   Lymphs Abs 1.8  0.7 - 4.0 (K/uL)   Monocytes Absolute 0.7  0.1 - 1.0 (K/uL)   Eosinophils Absolute 0.8 (*) 0.0 - 0.7 (K/uL)   Basophils Absolute 0.1  0.0 - 0.1 (K/uL)   Smear Review MORPHOLOGY UNREMARKABLE    BASIC METABOLIC PANEL      Component Value Range   Sodium 141  135 - 145 (mEq/L)   Potassium 3.6  3.5 - 5.1 (mEq/L)   Chloride 106  96 - 112 (mEq/L)   CO2 25  19 - 32 (mEq/L)   Glucose, Bld 97  70 - 99 (mg/dL)   BUN 10  6 - 23 (mg/dL)   Creatinine, Ser 7.25  0.50 - 1.35 (mg/dL)   Calcium 9.4  8.4 - 36.6 (mg/dL)   GFR calc non Af Amer >90  >90 (mL/min)   GFR calc Af Amer >90  >90 (mL/min)  PRO B NATRIURETIC PEPTIDE      Component Value Range   Pro B  Natriuretic peptide (BNP) 146.7 (*) 0 - 125 (pg/mL)    LITHIUM LEVEL      Component Value Range   Lithium Lvl <0.25 (*) 0.80 - 1.40 (mEq/L)  CULTURE, SPUTUM-ASSESSMENT      Component Value Range   Specimen Description SPUTUM EXPECTORATED     Special Requests NONE     Sputum evaluation       Value: THIS SPECIMEN IS ACCEPTABLE. RESPIRATORY CULTURE REPORT TO FOLLOW.   Report Status 02/01/2011 FINAL     Dg Chest 2 View  01/29/2011  *RADIOLOGY REPORT*  Clinical Data: Cough and shortness of breath  CHEST - 2 VIEW  Comparison: 12/04/2010  Findings: Normal heart size and pulmonary vascularity.  Fibrosis in the lungs.  Linear scarring in the left upper lung, stable since the prior study.  No focal airspace consolidation.  No blunting of costophrenic angles.  Old fracture deformity of the right posterior air sixth rib.  No pneumothorax.  IMPRESSION: No evidence of active pulmonary disease.  Original Report Authenticated By: Marlon Pel, M.D.   Dg Chest Portable 1 View  01/31/2011  *RADIOLOGY REPORT*  Clinical Data: Severe shortness of breath.  PORTABLE CHEST - 1 VIEW  Comparison: 01/29/2011  Findings: Shallow inspiration.  Heart size and pulmonary vascularity are normal.  Diffuse pulmonary fibrosis and linear scarring again demonstrated without significant change.  No focal airspace consolidation.  Scattered calcified granulomas in the lungs.  No blunting of costophrenic angles.  No pneumothorax. Degenerative changes in the spine and shoulders.  Old right rib fractures.  No significant change since previous study.  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  No superimposed airspace consolidation.  Emphysematous changes.  Original Report Authenticated By: Marlon Pel, M.D.    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 When  compared with ECG of 12/04/2010, no significant changes are seen.  Old EKG Reviewed: unchanged  He was given an additional albuterol nebulizer treatment and a dose of intravenous Solu-Medrol. There was little improvement and he was given an additional albuterol nebulizer treatment. Following this, he was reported to have had a seizure with urinary incontinence. This was witnessed by nursing staff but not by me. He then apparently had an additional seizure. I went in to evaluate him and noticed some voluntary shaking but nothing that appeared to be seizure-like. Had additional reports of seizures while I was not in the room to visualize the mother. Because of multiple seizures, he was given a loading dose of Keppra intravenously. He was moved to resuscitation area in order to observe him more closely. I have gone back to reevaluate him and at that point, he was sleeping with oxygen saturations in the mid-90s and while sleeping, is lungs were clear. On waking up, he immediately was asking for medication for pain. At this point, I am suspicious that his seizures were actually pseudoseizures but with multiple seizures having occurred, he should be admitted for observation. I am also somewhat suspicious that his wheezing may be supratentorial and self-induced. Case has been discussed with Dr. Joneen Roach who agrees to admit the patient.  CRITICAL CARE Performed by: Dione Booze   Total critical care time: 90 minutes  Critical care time was exclusive of separately billable procedures and treating other patients.  Critical care was necessary to treat or prevent imminent or life-threatening  deterioration.  Critical care was time spent personally by me on the following activities: development of treatment plan with patient and/or surrogate as well as nursing, discussions with consultants, evaluation of patient's response to treatment, examination of patient, obtaining history from patient or surrogate, ordering and  performing treatments and interventions, ordering and review of laboratory studies, ordering and review of radiographic studies, pulse oximetry and re-evaluation of patient's condition.    1. COPD exacerbation   2. Seizures       MDM  Exacerbation of COPD with possible pneumonia. He'll be given additional albuterol nebulizer treatments and chest x-ray obtained.        Dione Booze, MD 02/01/11 [phone]

## 2011-02-01 NOTE — Progress Notes (Addendum)
Subjective: Feeling better already; less short of breath; no fever and just mild chest wall discomfort with cough.  Objective: Vital signs in last 24 hours: Temp:  [98.2 F (36.8 C)] 98.2 F (36.8 C) (01/31 0456) Pulse Rate:  [67-82] 67  (01/31 0456) Resp:  [22-28] 22  (01/31 0456) BP: (121-155)/(80-95) 155/84 mmHg (01/31 0456) SpO2:  [91 %-99 %] 94 % (01/31 0851) Weight:  [66.9 kg (147 lb 7.8 oz)] 66.9 kg (147 lb 7.8 oz) (01/31 0456) Weight change:  Last BM Date: 01/31/11  Intake/Output from previous day: 01/30 0701 - 01/31 0700 In: 120 [P.O.:120] Out: -  Total I/O In: 480 [P.O.:480] Out: 2000 [Urine:2000]   Physical Exam: General: Alert, awake, oriented x2, in no acute distress. HEENT: No bruits, no goiter. Heart: Regular rate and rhythm, without murmurs, rubs, gallops. Lungs:positive expiratory wheezes; scattered ronchi. Abdomen: Soft, nontender, nondistended, positive bowel sounds. Extremities: No clubbing cyanosis or edema with positive pedal pulses. Neuro: Nonfocal.   Lab Results: Basic Metabolic Panel:  Basename 02/01/11 0619 01/31/11 2329  NA -- 141  K -- 3.6  CL -- 106  CO2 -- 25  GLUCOSE -- 97  BUN -- 10  CREATININE 0.78 0.81  CALCIUM -- 9.4  MG -- --  PHOS -- --   CBC:  Basename 02/01/11 0619 01/31/11 2329  WBC 3.3* 6.3  NEUTROABS -- 2.9  HGB 11.6* 11.2*  HCT 35.0* 34.4*  MCV 81.2 80.8  PLT 220 230   Cardiac Enzymes:  Basename 02/01/11 1304 02/01/11 0619  CKTOTAL -- --  CKMB -- --  CKMBINDEX -- --  TROPONINI <0.30 <0.30   BNP:  Basename 01/31/11 2329  PROBNP 146.7*   Urine Drug Screen: Drugs of Abuse     Component Value Date/Time   LABOPIA NTD 05/03/2009 0009   COCAINSCRNUR NTD 05/03/2009 0009   LABBENZ NTD 05/03/2009 0009   AMPHETMU NTD 05/03/2009 0009   THCU NTD 05/03/2009 0009   LABBARB NTD 05/03/2009 0009    Misc. Labs:  Recent Results (from the past 240 hour(s))  CULTURE, SPUTUM-ASSESSMENT     Status: Normal   Collection Time    01/31/11 11:23 PM      Component Value Range Status Comment   Specimen Description SPUTUM EXPECTORATED   Final    Special Requests NONE   Final    Sputum evaluation     Final    Value: THIS SPECIMEN IS ACCEPTABLE. RESPIRATORY CULTURE REPORT TO FOLLOW.   Report Status 02/01/2011 FINAL   Final   CULTURE, RESPIRATORY     Status: Normal (Preliminary result)   Collection Time   01/31/11 11:30 PM      Component Value Range Status Comment   Specimen Description SPUTUM EXPECTORATED   Final    Special Requests NONE   Final    Gram Stain     Final    Value: FEW WBC PRESENT,BOTH PMN AND MONONUCLEAR     RARE SQUAMOUS EPITHELIAL CELLS PRESENT     NO ORGANISMS SEEN   Culture PENDING   Incomplete    Report Status PENDING   Incomplete     Studies/Results: Dg Chest Portable 1 View  01/31/2011  *RADIOLOGY REPORT*  Clinical Data: Severe shortness of breath.  PORTABLE CHEST - 1 VIEW  Comparison: 01/29/2011  Findings: Shallow inspiration.  Heart size and pulmonary vascularity are normal.  Diffuse pulmonary fibrosis and linear scarring again demonstrated without significant change.  No focal airspace consolidation.  Scattered calcified granulomas in the lungs.  No blunting of costophrenic angles.  No pneumothorax. Degenerative changes in the spine and shoulders.  Old right rib fractures.  No significant change since previous study.  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  No superimposed airspace consolidation.  Emphysematous changes.  Original Report Authenticated By: Marlon Pel, M.D.    Medications: Scheduled Meds:   . albuterol  2.5 mg Nebulization Once  . albuterol  2.5 mg Nebulization Once  . amLODipine  10 mg Oral Daily  . aspirin  325 mg Oral Daily  . docusate  50 mg Oral Daily  . enoxaparin  40 mg Subcutaneous Q24H  . levetiracetam  1,000 mg Intravenous Once  . levothyroxine  75 mcg Oral QAC breakfast  . lisinopril  5 mg Oral Daily  . lithium carbonate  450  mg Oral Daily  . methylPREDNISolone (SOLU-MEDROL) injection  125 mg Intravenous Once  . methylPREDNISolone (SOLU-MEDROL) injection  40 mg Intravenous Q12H  . nicotine  14 mg Transdermal Daily  . pantoprazole  40 mg Oral Q1200  . pneumococcal 23 valent vaccine  0.5 mL Intramuscular Tomorrow-1000  . potassium chloride  20 mEq Oral Daily  . risperiDONE  1 mg Oral Daily  . risperiDONE  2 mg Oral QHS  . Tamsulosin HCl  0.4 mg Oral PC supper  . traZODone  100 mg Oral QHS  . DISCONTD: ipratropium  0.5 mg Nebulization Once  . DISCONTD: potassium chloride  20 mEq Oral Daily   Continuous Infusions:  PRN Meds:.albuterol, diphenhydrAMINE, haloperidol, HYDROcodone-acetaminophen, ipratropium, nitroGLYCERIN, ondansetron, sodium chloride  Assessment/Plan: 1-Chest pain: most likely due to constant cough due to COPD/pulmonary fibrosis exacerbation. Will use PRN dextromethorphan and also continue nebulizer tx and steroids. EKG WNL and so far negative CE'z.  2-COPD with acute exacerbation: continue nebs and steroids.  3-Schizoaffective disorder: continue home meds.  4-Mental retardation, mild (I.Q. 50-70): continue supportive care.  5-Tobacco abuse: on nicotine patch.  6-Hypothyroidism: continue synthroid  7-BPH (benign prostatic hyperplasia):continue flomax  8-GERD: continue PPI.  9-HTN: continue amlodipine  10-? Seizure: Follow eeg ordered, but pending; no signs of active seizure appreciated.      LOS: 1 day   [name] [facility] [phone]  02/01/2011, 1:51 PM

## 2011-02-01 NOTE — H&P (Signed)
PCP:  Unknown   Chief Complaint:  Shortness of breath and chest pain  HPI: This is a 56 year old gentleman with mild mental retardation, he was brought to complaints of shortness of breath and wheezing. He received nebulizer treatments in the ER. He does smoke. There is no report of any fevers, chills, nausea, vomiting. Per ER physician Dr. Preston Fleeting he was called by nursing twice a and told the patient was having seizures. Per ER physician he did not believe the had real seizures, he reports no post ictal phase. The patient denies any history of seizures but he states he had seizures in the ER today. Additionally, when I entered the patient room, he immediately said, "I've shortness of breath and chest pain". When asked where he indicated left-sided chest pains. Patient has no prior cardiac history.  Review of Systems: Positives bolded  anorexia, fever, weight loss,, vision loss, decreased hearing, hoarseness, chest pain, syncope, dyspnea on exertion, peripheral edema, balance deficits, hemoptysis, abdominal pain, melena, hematochezia, severe indigestion/heartburn, hematuria, incontinence, genital sores, muscle weakness, suspicious skin lesions, transient blindness, difficulty walking, depression, unusual weight change, abnormal bleeding, enlarged lymph nodes, angioedema, and breast masses.  Past Medical History: Past Medical History  Diagnosis Date  . Psychiatric disorder   . Schizoaffective disorder   . Organic mood disorder   . Intermittent explosive disorder   . Generalized anxiety disorder   . Mild mental retardation   . COPD (chronic obstructive pulmonary disease)   . Hypertension   . Peptic ulcer   . Thyroid disease   . Prostate hyperplasia with urinary obstruction   . Blind left eye    History reviewed. No pertinent past surgical history.  Medications: Prior to Admission medications   Medication Sig Start Date End Date Taking? Authorizing Provider  albuterol (PROVENTIL  HFA;VENTOLIN HFA) 108 (90 BASE) MCG/ACT inhaler Inhale 1 puff into the lungs every 6 (six) hours as needed. Shortness of breath     Historical Provider, MD  albuterol-ipratropium (COMBIVENT) 18-103 MCG/ACT inhaler Inhale 2 puffs into the lungs every 6 (six) hours as needed. Shortness of breath.     Historical Provider, MD  amLODipine (NORVASC) 10 MG tablet Take 10 mg by mouth daily.      Historical Provider, MD  diphenhydrAMINE (BENADRYL) 50 MG capsule Take 50 mg by mouth every 6 (six) hours as needed. For insomnia    Historical Provider, MD  docusate (COLACE) 50 MG/5ML liquid Take 50 mg by mouth daily.      Historical Provider, MD  haloperidol (HALDOL) 2 MG tablet Take 2 mg by mouth every 6 (six) hours as needed. agitation    Historical Provider, MD  HYDROcodone-acetaminophen (NORCO) 5-325 MG per tablet Take 1 tablet by mouth every 6 (six) hours as needed. pain    Historical Provider, MD  levofloxacin (LEVAQUIN) 500 MG tablet Take 500 mg by mouth daily.    Historical Provider, MD  levothyroxine (SYNTHROID, LEVOTHROID) 75 MCG tablet Take 75 mcg by mouth daily.      Historical Provider, MD  lisinopril (PRINIVIL,ZESTRIL) 5 MG tablet Take 5 mg by mouth daily.      Historical Provider, MD  lithium carbonate 300 MG capsule Take 450 mg by mouth daily.    Historical Provider, MD  omeprazole (PRILOSEC) 20 MG capsule Take 20 mg by mouth 2 (two) times daily.      Historical Provider, MD  ondansetron (ZOFRAN ODT) 8 MG disintegrating tablet 8mg  ODT q4 hours prn nausea 01/29/11 02/05/11  Angus Seller,  PA  potassium chloride (KLOR-CON) 20 MEQ packet Take 20 mEq by mouth daily.      Historical Provider, MD  risperiDONE (RISPERDAL) 1 MG tablet Take 1 mg by mouth daily.      Historical Provider, MD  risperiDONE (RISPERDAL) 2 MG tablet Take 2 mg by mouth at bedtime.      Historical Provider, MD  Tamsulosin HCl (FLOMAX) 0.4 MG CAPS Take 0.4 mg by mouth daily after supper.      Historical Provider, MD  traZODone  (DESYREL) 100 MG tablet Take 100 mg by mouth at bedtime.      Historical Provider, MD    Allergies:   Allergies  Allergen Reactions  . Chlorpromazine     unknown  . Fluphenazine     unknown  . Lactose Intolerance (Gi)     unknown  . Peanut-Containing Drug Products     unknown  . Prolixin (Fluphenazine Hcl)     unknown  . Thorazine (Chlorpromazine Hcl)     unknown    Social History:  reports that he has been smoking Cigarettes.  He has been smoking about .25 packs per day. He has never used smokeless tobacco. He reports that he does not drink alcohol or use illicit drugs.  Family History: Family History  Problem Relation Age of Onset  . Hypertension      Physical Exam: Filed Vitals:   01/31/11 2159 01/31/11 2215 01/31/11 2313  BP:  146/95 121/80  Pulse:  82 75  Resp:  28   SpO2: 91% 99% 96%    General:  Alert and oriented times three, well developed and nourished, no acute distress Eyes: PERRLA, pink conjunctiva, no scleral icterus ENT: Moist oral mucosa, neck supple, no thyromegaly Lungs: clear to ascultation, scattered wheeze, no crackles, no use of accessory muscles Cardiovascular: regular rate and rhythm, no regurgitation, no gallops, no murmurs. No carotid bruits, no JVD Abdomen: soft, positive BS, non-tender, non-distended, no organomegaly, not an acute abdomen GU: not examined Neuro: CN II - XII grossly intact, sensation intact Musculoskeletal: strength 5/5 all extremities, no clubbing, cyanosis or edema Skin: no rash, no subcutaneous crepitation, no decubitus    Labs on Admission:   [facility] 01/31/11 2329  NA 141  K 3.6  CL 106  CO2 25  GLUCOSE 97  BUN 10  CREATININE 0.81  CALCIUM 9.4  MG --  PHOS --   No results found for this basename: AST:2,ALT:2,ALKPHOS:2,BILITOT:2,PROT:2,ALBUMIN:2 in the last 72 hours No results found for this basename: LIPASE:2,AMYLASE:2 in the last 72 hours  Basename 01/31/11 2329  WBC 6.3  NEUTROABS 2.9  HGB 11.2*   HCT 34.4*  MCV 80.8  PLT 230   No results found for this basename: CKTOTAL:3,CKMB:3,CKMBINDEX:3,TROPONINI:3 in the last 72 hours No components found with this basename: POCBNP:3 No results found for this basename: DDIMER:2 in the last 72 hours No results found for this basename: HGBA1C:2 in the last 72 hours No results found for this basename: CHOL:2,HDL:2,LDLCALC:2,TRIG:2,CHOLHDL:2,LDLDIRECT:2 in the last 72 hours No results found for this basename: TSH,T4TOTAL,FREET3,T3FREE,THYROIDAB in the last 72 hours No results found for this basename: VITAMINB12:2,FOLATE:2,FERRITIN:2,TIBC:2,IRON:2,RETICCTPCT:2 in the last 72 hours  Micro Results: Recent Results (from the past 240 hour(s))  CULTURE, SPUTUM-ASSESSMENT     Status: Normal   Collection Time   01/31/11 11:23 PM      Component Value Range Status Comment   Specimen Description SPUTUM EXPECTORATED   Final    Special Requests NONE   Final    Sputum  evaluation     Final    Value: THIS SPECIMEN IS ACCEPTABLE. RESPIRATORY CULTURE REPORT TO FOLLOW.   Report Status 02/01/2011 FINAL   Final      Radiological Exams on Admission: Dg Chest Portable 1 View  01/31/2011  *RADIOLOGY REPORT*  Clinical Data: Severe shortness of breath.  PORTABLE CHEST - 1 VIEW  Comparison: 01/29/2011  Findings: Shallow inspiration.  Heart size and pulmonary vascularity are normal.  Diffuse pulmonary fibrosis and linear scarring again demonstrated without significant change.  No focal airspace consolidation.  Scattered calcified granulomas in the lungs.  No blunting of costophrenic angles.  No pneumothorax. Degenerative changes in the spine and shoulders.  Old right rib fractures.  No significant change since previous study.  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  No superimposed airspace consolidation.  Emphysematous changes.  Original Report Authenticated By: Marlon Pel, M.D.    Assessment/Plan Present on Admission:  .COPD with  acute exacerbation Tobacco use Admit patient to telemetry Steroids, nebulizers ordered Nicotine patch  .Chest pain Cycle cardiac enzymes, aspirin ordered   atypical chest, doubtful to be cardiac  Question seizures Per er physician also doubtful. EEG ordered Hypertension Schizoaffective disorder Hypothyroidism BPH Mental retardation/mild Stable resume home medications  Full code DVT prophylaxis Team 4/Dr. Park Pope, Tameika Heckmann 02/01/2011, 4:13 AM

## 2011-02-01 NOTE — Progress Notes (Signed)
Around 1800 Pt not conversant, jerking around in bed like he was having a seizure; does not respond to verbal cues.  Md notified, orders placed for Keppra.  Seizure like activity continued until about 1830, then pt became more alert, requested dinner.  Wheezing noticeably increased, but unable to get respiratory to give a treatment.  Repositioned in bed sitting up.  Resp tx given just at change of shift.  Bed and chair alarms activiated.

## 2011-02-02 LAB — LIPID PANEL
Cholesterol: 150 mg/dL (ref 0–200)
Total CHOL/HDL Ratio: 3.2 RATIO
Triglycerides: 61 mg/dL (ref <150)
VLDL: 12 mg/dL (ref 0–40)

## 2011-02-02 MED ORDER — PREDNISONE 50 MG PO TABS
60.0000 mg | ORAL_TABLET | Freq: Two times a day (BID) | ORAL | Status: DC
  Administered 2011-02-02 – 2011-02-05 (×6): 60 mg via ORAL
  Filled 2011-02-02 (×10): qty 1

## 2011-02-02 MED ORDER — FLUTICASONE PROPIONATE HFA 44 MCG/ACT IN AERO
2.0000 | INHALATION_SPRAY | Freq: Two times a day (BID) | RESPIRATORY_TRACT | Status: DC
  Administered 2011-02-03 – 2011-02-05 (×5): 2 via RESPIRATORY_TRACT
  Filled 2011-02-02 (×2): qty 10.6

## 2011-02-02 MED ORDER — RISPERIDONE 1 MG PO TABS
1.0000 mg | ORAL_TABLET | Freq: Two times a day (BID) | ORAL | Status: DC
  Administered 2011-02-02 – 2011-02-05 (×6): 1 mg via ORAL
  Filled 2011-02-02 (×9): qty 1

## 2011-02-02 MED ORDER — LITHIUM CARBONATE 300 MG PO TABS: 300.0000 mg | ORAL_TABLET | Freq: Three times a day (TID) | ORAL | Status: DC

## 2011-02-02 MED ORDER — SODIUM CHLORIDE 0.9 % IJ SOLN
3.0000 mL | Freq: Two times a day (BID) | INTRAMUSCULAR | Status: DC
  Administered 2011-02-02 – 2011-02-04 (×6): 3 mL via INTRAVENOUS

## 2011-02-02 MED ORDER — LITHIUM CARBONATE 300 MG PO CAPS
300.0000 mg | ORAL_CAPSULE | Freq: Three times a day (TID) | ORAL | Status: DC
  Administered 2011-02-02 – 2011-02-05 (×9): 300 mg via ORAL
  Filled 2011-02-02 (×13): qty 1

## 2011-02-02 NOTE — Progress Notes (Signed)
Subjective: Feeling better already; less short of breath; no fever and just mild chest wall discomfort with cough.  Objective: Vital signs in last 24 hours: Temp:  [97.6 F (36.4 C)-98.4 F (36.9 C)] 97.6 F (36.4 C) (02/01 1340) Pulse Rate:  [63-78] 70  (02/01 1340) Resp:  [16-20] 16  (02/01 1340) BP: (115-133)/(61-82) 128/82 mmHg (02/01 1340) SpO2:  [90 %-95 %] 90 % (02/01 1340) Weight change:  Last BM Date: 02/02/11  Intake/Output from previous day: 01/31 0701 - 02/01 0700 In: 1185 [P.O.:1080; IV Piggyback:105] Out: 2200 [Urine:2200] Total I/O In: 1423 [P.O.:1420; I.V.:3] Out: 1235 [Urine:1235]   Physical Exam: General: Alert, awake, oriented x2, in no acute distress. HEENT: No bruits, no goiter. Heart: Regular rate and rhythm, without murmurs, rubs, gallops. Lungs:positive expiratory wheezes; scattered ronchi. Abdomen: Soft, nontender, nondistended, positive bowel sounds. Extremities: No clubbing cyanosis or edema with positive pedal pulses. Neuro: Nonfocal.   Lab Results: Basic Metabolic Panel:  Basename 02/01/11 0619 01/31/11 2329  NA -- 141  K -- 3.6  CL -- 106  CO2 -- 25  GLUCOSE -- 97  BUN -- 10  CREATININE 0.78 0.81  CALCIUM -- 9.4  MG -- --  PHOS -- --   CBC:  Basename 02/01/11 0619 01/31/11 2329  WBC 3.3* 6.3  NEUTROABS -- 2.9  HGB 11.6* 11.2*  HCT 35.0* 34.4*  MCV 81.2 80.8  PLT 220 230   Cardiac Enzymes:  Basename 02/01/11 2210 02/01/11 1745 02/01/11 1304 02/01/11 0619  CKTOTAL -- 48 -- --  CKMB -- 3.1 -- --  CKMBINDEX -- -- -- --  TROPONINI <0.30 -- <0.30 <0.30   BNP:  Basename 01/31/11 2329  PROBNP 146.7*   Urine Drug Screen: Drugs of Abuse     Component Value Date/Time   LABOPIA NTD 05/03/2009 0009   COCAINSCRNUR NTD 05/03/2009 0009   LABBENZ NTD 05/03/2009 0009   AMPHETMU NTD 05/03/2009 0009   THCU NTD 05/03/2009 0009   LABBARB NTD 05/03/2009 0009    Misc. Labs:  Recent Results (from the past 240 hour(s))  CULTURE,  SPUTUM-ASSESSMENT     Status: Normal   Collection Time   01/31/11 11:23 PM      Component Value Range Status Comment   Specimen Description SPUTUM EXPECTORATED   Final    Special Requests NONE   Final    Sputum evaluation     Final    Value: THIS SPECIMEN IS ACCEPTABLE. RESPIRATORY CULTURE REPORT TO FOLLOW.   Report Status 02/01/2011 FINAL   Final   CULTURE, RESPIRATORY     Status: Normal (Preliminary result)   Collection Time   01/31/11 11:30 PM      Component Value Range Status Comment   Specimen Description SPUTUM EXPECTORATED   Final    Special Requests NONE   Final    Gram Stain     Final    Value: FEW WBC PRESENT,BOTH PMN AND MONONUCLEAR     RARE SQUAMOUS EPITHELIAL CELLS PRESENT     NO ORGANISMS SEEN   Culture NORMAL OROPHARYNGEAL FLORA   Final    Report Status PENDING   Incomplete     Studies/Results: Dg Chest Portable 1 View  01/31/2011  *RADIOLOGY REPORT*  Clinical Data: Severe shortness of breath.  PORTABLE CHEST - 1 VIEW  Comparison: 01/29/2011  Findings: Shallow inspiration.  Heart size and pulmonary vascularity are normal.  Diffuse pulmonary fibrosis and linear scarring again demonstrated without significant change.  No focal airspace consolidation.  Scattered calcified granulomas  in the lungs.  No blunting of costophrenic angles.  No pneumothorax. Degenerative changes in the spine and shoulders.  Old right rib fractures.  No significant change since previous study.  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  No superimposed airspace consolidation.  Emphysematous changes.  Original Report Authenticated By: Marlon Pel, M.D.    Medications: Scheduled Meds:    . amLODipine  10 mg Oral Daily  . aspirin  325 mg Oral Daily  . docusate  50 mg Oral Daily  . enoxaparin  40 mg Subcutaneous Q24H  . fluticasone  2 puff Inhalation BID  . levetiracetam  500 mg Intravenous Q12H  . levothyroxine  75 mcg Oral QAC breakfast  . lisinopril  5 mg Oral  Daily  . nicotine  14 mg Transdermal Daily  . pantoprazole  40 mg Oral Q1200  . pneumococcal 23 valent vaccine  0.5 mL Intramuscular Tomorrow-1000  . potassium chloride  20 mEq Oral Daily  . predniSONE  60 mg Oral BID WC  . risperiDONE  1 mg Oral BID  . risperiDONE  2 mg Oral QHS  . sodium chloride  3 mL Intravenous Q12H  . Tamsulosin HCl  0.4 mg Oral PC supper  . traZODone  100 mg Oral QHS  . vitamin A & D      . vitamin A & D      . DISCONTD: lithium carbonate  450 mg Oral Daily  . DISCONTD: lithium  300 mg Oral TID  . DISCONTD: methylPREDNISolone (SOLU-MEDROL) injection  40 mg Intravenous Q12H  . DISCONTD: risperiDONE  1 mg Oral Daily   Continuous Infusions:  PRN Meds:.albuterol, diphenhydrAMINE, haloperidol, HYDROcodone-acetaminophen, ipratropium, nitroGLYCERIN, ondansetron, sodium chloride  Assessment/Plan: 1-Chest pain: most likely due to constant cough due to COPD/pulmonary fibrosis exacerbation. Continue steroids now will change to PO form and will also add pulmicort.  2-COPD with acute exacerbation: continue steroids; add pulmicort and also continue PRN nebs.  3-Schizoaffective disorder: continue home meds; dose has been adjusted as per patient home prescriptions.  4-Mental retardation, mild (I.Q. 50-70): continue supportive care.  5-Tobacco abuse: continue nicotine patch.  6-Hypothyroidism: continue synthroid  7-BPH (benign prostatic hyperplasia):continue flomax  8-GERD: continue PPI.  9-HTN: continue amlodipine  10-? Seizure: eeg ordered but pending. CK during what appeared to be seizure activity was normal; but prolactin was elevated and he had post ictal state and incontinence. Started on  keppra BID. No further episodes since.      LOS: 2 days   Shelbia Scinto [facility] [phone]  02/02/2011, 6:24 PM

## 2011-02-02 NOTE — Progress Notes (Addendum)
CSW called patients guardian, Doyce Para, to confirm discharge plan back to supervised living situation. CSW left voicemail requesting call back.  Jersee Winiarski C. Milea Klink MSW, LCSW [phone] Spoke with patients caregiver from the group home. No fl-2 is needed for return. Caregiver can be called when patient is cleared.  Karita Dralle C. Lakshmi Sundeen MSW, LCSW [phone]

## 2011-02-02 NOTE — Procedures (Signed)
EEG ID:  [id number].  HISTORY:  This is a 56 year old man with mental retardation referred for rule out seizures.  MEDICATIONS:  Keppra.  CONDITION OF RECORDING:  This 16-lead EEG was recorded on this patient in awake and drowsy states.  Background rhythms: background patterns in wakefulness were well-organized with a well sustained posterior dominant rhythm of 8.5 to 9 Hz, symmetrical and reactive to eye opening and closing.  Drowsiness was associated with mild attenuation of voltage and slowing frequencies.  Abnormal potentials: no epileptiform activity or focal slowing was noted.  ACTIVATION PROCEDURES:  Hyperventilation was not performed.  Photic stimulation was not performed.  EKG:  Single-channel of EKG monitoring was unremarkable.  IMPRESSION:  This was a normal awake and drowsy EEG.  A normal EEG does not rule out the clinical diagnosis of epilepsy.  If clinically warranted, a repeat extended EEG or ambulatory requiring may be attempted with prolonged recording times and sleep capture, which may increased the diagnostic yield.  Clinical correlation is suggested.          ______________________________ Carmell Austria, MD    [id number] D:  02/01/2011 14:47:31  T:  02/01/2011 21:56:51  Job #:  [id number]

## 2011-02-03 LAB — LITHIUM LEVEL: Lithium Lvl: 0.33 mEq/L — ABNORMAL LOW (ref 0.80–1.40)

## 2011-02-03 LAB — CULTURE, RESPIRATORY W GRAM STAIN

## 2011-02-03 MED ORDER — SODIUM CHLORIDE 0.9 % IV SOLN
1000.0000 mg | Freq: Two times a day (BID) | INTRAVENOUS | Status: DC
  Administered 2011-02-03 – 2011-02-04 (×2): 1000 mg via INTRAVENOUS
  Filled 2011-02-03 (×4): qty 10

## 2011-02-03 NOTE — Progress Notes (Signed)
Patient noted again jerking in bed, ??? Seizure activity and none of them (3pm and 5pm) lasted up to 5 seconds and patient respond  Back right away.  MD ware and orders inplace. Follow up with plan of care.

## 2011-02-03 NOTE — Progress Notes (Signed)
Subjective: No feeling well today, generalized weakness and presumed post ictal state. According to the [facility] and staff patient had 2 short episodes that correlates with seizure activity. Patient is also wheezing. No fever or chest pain.  Objective: Vital signs in last 24 hours: Temp:  [97.6 F (36.4 C)-98 F (36.7 C)] 98 F (36.7 C) (02/02 1400) Pulse Rate:  [79-89] 89  (02/02 1400) Resp:  [18-19] 19  (02/02 1400) BP: (137-149)/(87-98) 149/87 mmHg (02/02 1400) SpO2:  [90 %-97 %] 94 % (02/02 1400) Weight change:  Last BM Date: 02/02/11  Intake/Output from previous day: 02/01 0701 - 02/02 0700 In: 1423 [P.O.:1420; I.V.:3] Out: 1235 [Urine:1235] Total I/O In: 100 [IV Piggyback:100] Out: -    Physical Exam: General: Alert, awake, oriented x2, in no acute distress. HEENT: No bruits, no goiter. Heart: Regular rate and rhythm, without murmurs, rubs, gallops. Lungs:positive expiratory wheezes; scattered ronchi. Abdomen: Soft, nontender, nondistended, positive bowel sounds. Extremities: No clubbing, cyanosis or edema with positive pedal pulses. Neuro: Lethargic and slower than usual responding.   Lab Results: Basic Metabolic Panel:  Basename 02/01/11 0619 01/31/11 2329  NA -- 141  K -- 3.6  CL -- 106  CO2 -- 25  GLUCOSE -- 97  BUN -- 10  CREATININE 0.78 0.81  CALCIUM -- 9.4  MG -- --  PHOS -- --   CBC:  Basename 02/01/11 0619 01/31/11 2329  WBC 3.3* 6.3  NEUTROABS -- 2.9  HGB 11.6* 11.2*  HCT 35.0* 34.4*  MCV 81.2 80.8  PLT 220 230   Cardiac Enzymes:  Basename 02/01/11 2210 02/01/11 1745 02/01/11 1304 02/01/11 0619  CKTOTAL -- 48 -- --  CKMB -- 3.1 -- --  CKMBINDEX -- -- -- --  TROPONINI <0.30 -- <0.30 <0.30   BNP:  Basename 01/31/11 2329  PROBNP 146.7*   Urine Drug Screen: Drugs of Abuse     Component Value Date/Time   LABOPIA NTD 05/03/2009 0009   COCAINSCRNUR NTD 05/03/2009 0009   LABBENZ NTD 05/03/2009 0009   AMPHETMU NTD 05/03/2009 0009   THCU NTD  05/03/2009 0009   LABBARB NTD 05/03/2009 0009    Misc. Labs:  Recent Results (from the past 240 hour(s))  CULTURE, SPUTUM-ASSESSMENT     Status: Normal   Collection Time   01/31/11 11:23 PM      Component Value Range Status Comment   Specimen Description SPUTUM EXPECTORATED   Final    Special Requests NONE   Final    Sputum evaluation     Final    Value: THIS SPECIMEN IS ACCEPTABLE. RESPIRATORY CULTURE REPORT TO FOLLOW.   Report Status 02/01/2011 FINAL   Final   CULTURE, RESPIRATORY     Status: Normal   Collection Time   01/31/11 11:30 PM      Component Value Range Status Comment   Specimen Description SPUTUM EXPECTORATED   Final    Special Requests NONE   Final    Gram Stain     Final    Value: FEW WBC PRESENT,BOTH PMN AND MONONUCLEAR     RARE SQUAMOUS EPITHELIAL CELLS PRESENT     NO ORGANISMS SEEN   Culture NORMAL OROPHARYNGEAL FLORA   Final    Report Status 02/03/2011 FINAL   Final     Studies/Results: No results found.  Medications: Scheduled Meds:    . amLODipine  10 mg Oral Daily  . aspirin  325 mg Oral Daily  . docusate  50 mg Oral Daily  . enoxaparin  40 mg Subcutaneous Q24H  . fluticasone  2 puff Inhalation BID  . levetiracetam  1,000 mg Intravenous Q12H  . levothyroxine  75 mcg Oral QAC breakfast  . lisinopril  5 mg Oral Daily  . lithium carbonate  300 mg Oral TID  . nicotine  14 mg Transdermal Daily  . pantoprazole  40 mg Oral Q1200  . potassium chloride  20 mEq Oral Daily  . predniSONE  60 mg Oral BID WC  . risperiDONE  1 mg Oral BID  . risperiDONE  2 mg Oral QHS  . sodium chloride  3 mL Intravenous Q12H  . Tamsulosin HCl  0.4 mg Oral PC supper  . traZODone  100 mg Oral QHS  . DISCONTD: levetiracetam  500 mg Intravenous Q12H  . DISCONTD: lithium carbonate  450 mg Oral Daily  . DISCONTD: lithium  300 mg Oral TID  . DISCONTD: methylPREDNISolone (SOLU-MEDROL) injection  40 mg Intravenous Q12H  . DISCONTD: risperiDONE  1 mg Oral Daily   Continuous  Infusions:  PRN Meds:.albuterol, diphenhydrAMINE, haloperidol, HYDROcodone-acetaminophen, ipratropium, nitroGLYCERIN, ondansetron, sodium chloride  Assessment/Plan: 1-Chest pain: most likely due to constant cough due to COPD/pulmonary fibrosis exacerbation. Continue prednisone, nebulizers and Flovent twice a day.  2-COPD with acute exacerbation: continue steroids; pulmicort and also continue PRN nebs.  3-Schizoaffective disorder: continue home meds; dose has been adjusted as per patient home prescriptions. Will check lithium level.  4-Mental retardation, mild (I.Q. 50-70): continue supportive care.  5-Tobacco abuse: continue nicotine patch.  6-Hypothyroidism: continue synthroid.  7-BPH (benign prostatic hyperplasia):continue flomax  8-GERD: continue PPI.  9-HTN: continue amlodipine  10-? Seizure: eeg still pending. With 2 new short episodes that appears to be seizures. Will increase Keppra to 1000 mg twice a day.     LOS: 3 days   Rosa Wyly [facility] [phone]  02/03/2011, 4:38 PM

## 2011-02-03 NOTE — Progress Notes (Signed)
Patient noted jerking in bed with eyes closed but patient responded when his name was called but very slow to respond compare to his AM assessment, when asked what is wrong he stated "am weak" Vitals 138/84, 98.5 96 24, 92% 2L. Dr. Gwenlyn Perking notified, on the way assess patient, No new order given. Will continue to assess patient.

## 2011-02-04 LAB — BASIC METABOLIC PANEL
BUN: 14 mg/dL (ref 6–23)
Calcium: 9.9 mg/dL (ref 8.4–10.5)
Creatinine, Ser: 0.78 mg/dL (ref 0.50–1.35)
GFR calc Af Amer: >90 mL/min (ref >90)
GFR calc non Af Amer: >90 mL/min (ref >90)
Glucose, Bld: 92 mg/dL (ref 70–99)
Potassium: 3.5 mEq/L (ref 3.5–5.1)

## 2011-02-04 LAB — CBC
HCT: 35.3 % — ABNORMAL LOW (ref 39.0–52.0)
Hemoglobin: 11.4 g/dL — ABNORMAL LOW (ref 13.0–17.0)
MCH: 26.3 pg (ref 26.0–34.0)
MCHC: 32.3 g/dL (ref 30.0–36.0)
MCV: 81.5 fL (ref 78.0–100.0)
RDW: 13.4 % (ref 11.5–15.5)

## 2011-02-04 MED ORDER — LEVETIRACETAM 500 MG PO TABS
1000.0000 mg | ORAL_TABLET | Freq: Two times a day (BID) | ORAL | Status: DC
  Administered 2011-02-04 – 2011-02-05 (×3): 1000 mg via ORAL
  Filled 2011-02-04 (×6): qty 2

## 2011-02-04 NOTE — Progress Notes (Signed)
Subjective: Decrease wheezing, no CP and no further seizure activity. Home tomorrow.  Objective: Vital signs in last 24 hours: Temp:  [97.6 F (36.4 C)-98 F (36.7 C)] 98 F (36.7 C) (02/03 1306) Pulse Rate:  [60-64] 61  (02/03 1306) Resp:  [18-20] 18  (02/03 1306) BP: (121-154)/(75-88) 129/75 mmHg (02/03 1306) SpO2:  [91 %-94 %] 92 % (02/03 1306) Weight change:  Last BM Date: 02/02/11  Intake/Output from previous day: 02/02 0701 - 02/03 0700 In: 803 [P.O.:480; I.V.:3; IV Piggyback:320] Out: 2225 [Urine:2225] Total I/O In: -  Out: 900 [Urine:900]   Physical Exam: General: Alert, awake, oriented x2, in no acute distress. HEENT: No bruits, no goiter. Heart: Regular rate and rhythm, without murmurs, rubs, gallops. Lungs:positive expiratory wheezes; scattered ronchi. Abdomen: Soft, nontender, nondistended, positive bowel sounds. Extremities: No clubbing, cyanosis or edema with positive pedal pulses. Neuro: Lethargic and slower than usual responding.   Lab Results: Basic Metabolic Panel:  Basename 02/04/11 0530  NA 140  K 3.5  CL 104  CO2 27  GLUCOSE 92  BUN 14  CREATININE 0.78  CALCIUM 9.9  MG --  PHOS --   CBC:  Basename 02/04/11 0530  WBC 10.2  NEUTROABS --  HGB 11.4*  HCT 35.3*  MCV 81.5  PLT 260   Cardiac Enzymes:  Basename 02/01/11 2210 02/01/11 1745  CKTOTAL -- 48  CKMB -- 3.1  CKMBINDEX -- --  TROPONINI <0.30 --   Urine Drug Screen: Drugs of Abuse     Component Value Date/Time   LABOPIA NTD 05/03/2009 0009   COCAINSCRNUR NTD 05/03/2009 0009   LABBENZ NTD 05/03/2009 0009   AMPHETMU NTD 05/03/2009 0009   THCU NTD 05/03/2009 0009   LABBARB NTD 05/03/2009 0009    Misc. Labs:  Recent Results (from the past 240 hour(s))  CULTURE, SPUTUM-ASSESSMENT     Status: Normal   Collection Time   01/31/11 11:23 PM      Component Value Range Status Comment   Specimen Description SPUTUM EXPECTORATED   Final    Special Requests NONE   Final    Sputum  evaluation     Final    Value: THIS SPECIMEN IS ACCEPTABLE. RESPIRATORY CULTURE REPORT TO FOLLOW.   Report Status 02/01/2011 FINAL   Final   CULTURE, RESPIRATORY     Status: Normal   Collection Time   01/31/11 11:30 PM      Component Value Range Status Comment   Specimen Description SPUTUM EXPECTORATED   Final    Special Requests NONE   Final    Gram Stain     Final    Value: FEW WBC PRESENT,BOTH PMN AND MONONUCLEAR     RARE SQUAMOUS EPITHELIAL CELLS PRESENT     NO ORGANISMS SEEN   Culture NORMAL OROPHARYNGEAL FLORA   Final    Report Status 02/03/2011 FINAL   Final     Studies/Results: No results found.  Medications: Scheduled Meds:    . amLODipine  10 mg Oral Daily  . aspirin  325 mg Oral Daily  . docusate  50 mg Oral Daily  . enoxaparin  40 mg Subcutaneous Q24H  . fluticasone  2 puff Inhalation BID  . levETIRAcetam  1,000 mg Oral Q12H  . levothyroxine  75 mcg Oral QAC breakfast  . lisinopril  5 mg Oral Daily  . lithium carbonate  300 mg Oral TID  . nicotine  14 mg Transdermal Daily  . pantoprazole  40 mg Oral Q1200  . potassium chloride  20 mEq Oral Daily  . predniSONE  60 mg Oral BID WC  . risperiDONE  1 mg Oral BID  . risperiDONE  2 mg Oral QHS  . sodium chloride  3 mL Intravenous Q12H  . Tamsulosin HCl  0.4 mg Oral PC supper  . traZODone  100 mg Oral QHS  . DISCONTD: levetiracetam  1,000 mg Intravenous Q12H  . DISCONTD: levetiracetam  500 mg Intravenous Q12H   Continuous Infusions:  PRN Meds:.albuterol, diphenhydrAMINE, haloperidol, HYDROcodone-acetaminophen, ipratropium, nitroGLYCERIN, ondansetron, sodium chloride  Assessment/Plan: 1-Chest pain: most likely due to constant cough due to COPD/pulmonary fibrosis exacerbation. Continue prednisone, nebulizers and Flovent twice a day.  2-COPD with acute exacerbation: continue steroids; pulmicort and also continue PRN nebs.  3-Schizoaffective disorder: Lithium level increasing slowly as expected; continue lithium at  current dose.  4-Mental retardation, mild (I.Q. 50-70): continue supportive care.  5-Tobacco abuse: continue nicotine patch.  6-Hypothyroidism: continue synthroid.  7-BPH (benign prostatic hyperplasia):continue flomax  8-GERD: continue PPI.  9-HTN: continue amlodipine  10-? Seizure: eeg w/o epileptical forms. But with episodes that appears to be seizures and discussion with Neurology (Dr. Lyman Speller), plan is to continue keppra 1000mg  bid and to follow with neurology as an outpatient.  Disposition; home tomorrow.   LOS: 4 days   Gianno Volner [facility] [phone]  02/04/2011, 3:49 PM

## 2011-02-04 NOTE — Progress Notes (Signed)
Pt scratched his right eye today with blood noted to sclera. Pt said he "scratched his eye". Pt says he has "no pain". Monitoring closely.

## 2011-02-05 MED ORDER — TAMSULOSIN HCL 0.4 MG PO CAPS: 0.4000 mg | ORAL_CAPSULE | ORAL | Status: DC

## 2011-02-05 MED ORDER — PREDNISONE 20 MG PO TABS: ORAL_TABLET | ORAL | Status: DC

## 2011-02-05 MED ORDER — FLUTICASONE PROPIONATE HFA 44 MCG/ACT IN AERO: 2.0000 | INHALATION_SPRAY | Freq: Two times a day (BID) | RESPIRATORY_TRACT | Status: DC

## 2011-02-05 MED ORDER — RISPERIDONE 1 MG PO TABS: 1.0000 mg | ORAL_TABLET | Freq: Two times a day (BID) | ORAL | Status: DC

## 2011-02-05 MED ORDER — LEVETIRACETAM 1000 MG PO TABS: 1000.0000 mg | ORAL_TABLET | Freq: Two times a day (BID) | ORAL | Status: DC

## 2011-02-05 MED ORDER — RISPERIDONE 1 MG PO TABS: 1.0000 mg | ORAL_TABLET | Freq: Every day | ORAL | Status: DC

## 2011-02-05 NOTE — Progress Notes (Signed)
[facility] [phone] called for an appointment. Secretary from [facility] will call pt's Caregiver Nonnie Done to make an appointment. mp

## 2011-02-05 NOTE — Discharge Summary (Signed)
Physician Discharge Summary  Patient ID: SERGIO ZAWISLAK MRN: [id number] DOB/AGE: 1955-06-09 56 y.o.  Admit date: 01/31/2011 Discharge date: 02/05/2011  Primary Care Physician:  No primary provider on file.   Discharge Diagnoses:   1-Chest pain 2-COPD with acute exacerbation 3-GERD 4-Seizure 5-Mental retardation 6-HTN 7-Hypothyroidism 8-BPH 9-schizoaffective disorder.   Medication List  As of 02/05/2011  1:36 PM   STOP taking these medications         albuterol 108 (90 BASE) MCG/ACT inhaler      levofloxacin 500 MG tablet      risperiDONE 1 MG tablet         TAKE these medications         albuterol-ipratropium 18-103 MCG/ACT inhaler   Commonly known as: COMBIVENT   Inhale 2 puffs into the lungs every 6 (six) hours as needed. Shortness of breath.      amLODipine 10 MG tablet   Commonly known as: NORVASC   Take 10 mg by mouth daily.      diphenhydrAMINE 50 MG capsule   Commonly known as: BENADRYL   Take 50 mg by mouth every 6 (six) hours as needed. For insomnia      docusate sodium 100 MG capsule   Commonly known as: COLACE   Take 100 mg by mouth 2 (two) times daily.      fluticasone 44 MCG/ACT inhaler   Commonly known as: FLOVENT HFA   Inhale 2 puffs into the lungs 2 (two) times daily.      haloperidol 2 MG tablet   Commonly known as: HALDOL   Take 2 mg by mouth every 6 (six) hours as needed. agitation      HYDROcodone-acetaminophen 5-325 MG per tablet   Commonly known as: NORCO   Take 1 tablet by mouth every 6 (six) hours as needed. pain      levETIRAcetam 1000 MG tablet   Commonly known as: KEPPRA   Take 1 tablet (1,000 mg total) by mouth every 12 (twelve) hours.      levothyroxine 75 MCG tablet   Commonly known as: SYNTHROID, LEVOTHROID   Take 75 mcg by mouth daily.      lisinopril 5 MG tablet   Commonly known as: PRINIVIL,ZESTRIL   Take 5 mg by mouth daily.      lithium 300 MG tablet   Take 300 mg by mouth 3 (three) times daily.     omeprazole 20 MG capsule   Commonly known as: PRILOSEC   Take 20 mg by mouth 2 (two) times daily.      ondansetron 8 MG disintegrating tablet   Commonly known as: ZOFRAN-ODT   8mg  ODT q4 hours prn nausea      potassium chloride 20 MEQ packet   Commonly known as: KLOR-CON   Take 20 mEq by mouth daily.      predniSONE 20 MG tablet   Commonly known as: DELTASONE   Take 3 tabs by mouth daily X 2 days; then 2 tablets by mouth daily X 2 days; then 1 tablet by mouth daily X 2 days; then 1/2 tablet by mouth daily X 3 days and stop prednisone.      risperiDONE 2 MG tablet   Commonly known as: RISPERDAL   Take 2 mg by mouth at bedtime.      risperiDONE 2 MG tablet   Commonly known as: RISPERDAL   Take 2 mg by mouth at bedtime.      Tamsulosin HCl 0.4 MG Caps  Commonly known as: FLOMAX   Take 1 capsule (0.4 mg total) by mouth daily after supper.      traZODone 100 MG tablet   Commonly known as: DESYREL   Take 100 mg by mouth at bedtime.             Disposition and Follow-up:  Patient discharge in stable and improved condition w/o any CP, SOB or seizure activity. He has been discharged on prednisone tapering dose and also pulmicort inhaler to control his COPD. Patient will also take keppra BID to control and prevent seizures. He will arrange follow up with PCP over the next 2 weeks and will follow with neurology as an outpatient. (Neurology group will call with appointment details). Patient will follow with his PCP in order to follow his chronic problem and have adjustment to the rest of his medications.   Consults:   Neurology was curbside (For EEG and recommendations regarding tx with anti-epileptic drugs).   Significant Diagnostic Studies:  EEG 01/31/11  No epileptic form waves or aberrant activity observed on EEG.  Brief H and P: 56 year old gentleman with mild mental retardation, he was brought to complaints of shortness of breath and wheezing. He received nebulizer  treatments in the ER. He does smoke. There is no report of any fevers, chills, nausea, vomiting. Per ER physician Dr. Preston Fleeting he was called by nursing twice a and told the patient was having seizures. Per ER physician he did not believe the had real seizures, he reports no post ictal phase. The patient denies any history of seizures but he states he had seizures in the ER today.    Hospital Course:  1-Chest pain: Noncardiac in etiology and most likely secondary to COPD exacerbation and gastroesophageal reflux disease. Will treat and discharge on PPI and also prednisone tapering and Pulmicort for his COPD.  2-COPD with acute exacerbation: Will start the patient on Pulmicort twice a day, followup prednisone tapering dose and to continue when necessary Combivent inhalers.  3-Schizoaffective disorder: Stable. Continue supportive care and home medications. Patient to follow with psychiatrist and PCP as an outpatient for further adjustment of his medications.  4-Mental retardation, mild (I.Q. 50-70): Unchanged. Plan is for the patient to be discharged back home with his personal caregiver.  5-Tobacco abuse: Counseling for smoking cessation was provided. Patient used nicotine patch during this hospitalization. According to Him, he is planning to quit. This is something that needs to be follow by PCP as an outpatient.  6-Hypothyroidism: Continue Synthroid.  7-BPH (benign prostatic hyperplasia): Continue Flomax.  8-seizure: Patient has been started on Keppra 1000 mg twice a day without any further seizure like activities. He will follow with neurology as an outpatient for further evaluation and to decide if he will need or not lifelong therapy with antiepileptic medications.  The rest of the patient's medications remain stable and no changes has been made to his medication regimen.   Time spent on Discharge: 40 minutes  Signed: Anie Juniel 02/05/2011, 1:36 PM

## 2011-02-08 ENCOUNTER — Encounter (HOSPITAL_COMMUNITY): Payer: Self-pay | Admitting: *Deleted

## 2011-02-08 ENCOUNTER — Other Ambulatory Visit: Payer: Self-pay

## 2011-02-08 ENCOUNTER — Emergency Department (HOSPITAL_COMMUNITY): Payer: Medicare Other

## 2011-02-08 ENCOUNTER — Observation Stay (HOSPITAL_COMMUNITY)
Admission: EM | Admit: 2011-02-08 | Discharge: 2011-02-13 | Payer: Medicare Other | Attending: Internal Medicine | Admitting: Internal Medicine

## 2011-02-08 DIAGNOSIS — F200 Paranoid schizophrenia: Secondary | ICD-10-CM | POA: Insufficient documentation

## 2011-02-08 DIAGNOSIS — R29898 Other symptoms and signs involving the musculoskeletal system: Secondary | ICD-10-CM | POA: Insufficient documentation

## 2011-02-08 DIAGNOSIS — Z5900 Homelessness unspecified: Secondary | ICD-10-CM | POA: Insufficient documentation

## 2011-02-08 DIAGNOSIS — J449 Chronic obstructive pulmonary disease, unspecified: Secondary | ICD-10-CM | POA: Insufficient documentation

## 2011-02-08 DIAGNOSIS — I252 Old myocardial infarction: Secondary | ICD-10-CM | POA: Insufficient documentation

## 2011-02-08 DIAGNOSIS — I10 Essential (primary) hypertension: Secondary | ICD-10-CM | POA: Diagnosis present

## 2011-02-08 DIAGNOSIS — J4489 Other specified chronic obstructive pulmonary disease: Secondary | ICD-10-CM | POA: Insufficient documentation

## 2011-02-08 DIAGNOSIS — F17200 Nicotine dependence, unspecified, uncomplicated: Secondary | ICD-10-CM | POA: Insufficient documentation

## 2011-02-08 DIAGNOSIS — M79609 Pain in unspecified limb: Principal | ICD-10-CM | POA: Insufficient documentation

## 2011-02-08 DIAGNOSIS — R071 Chest pain on breathing: Secondary | ICD-10-CM | POA: Diagnosis present

## 2011-02-08 DIAGNOSIS — M79603 Pain in arm, unspecified: Secondary | ICD-10-CM

## 2011-02-08 DIAGNOSIS — G319 Degenerative disease of nervous system, unspecified: Secondary | ICD-10-CM | POA: Insufficient documentation

## 2011-02-08 DIAGNOSIS — Z720 Tobacco use: Secondary | ICD-10-CM | POA: Diagnosis present

## 2011-02-08 DIAGNOSIS — R569 Unspecified convulsions: Secondary | ICD-10-CM | POA: Diagnosis present

## 2011-02-08 DIAGNOSIS — R4182 Altered mental status, unspecified: Secondary | ICD-10-CM

## 2011-02-08 DIAGNOSIS — F79 Unspecified intellectual disabilities: Secondary | ICD-10-CM | POA: Insufficient documentation

## 2011-02-08 HISTORY — DX: Unspecified convulsions: R56.9

## 2011-02-08 LAB — POCT I-STAT, CHEM 8
BUN: 14 mg/dL (ref 6–23)
Calcium, Ion: 1.33 mmol/L — ABNORMAL HIGH (ref 1.12–1.32)
Chloride: 108 mEq/L (ref 96–112)
Creatinine, Ser: 0.6 mg/dL (ref 0.50–1.35)
Glucose, Bld: 124 mg/dL — ABNORMAL HIGH (ref 70–99)
Potassium: 4.3 mEq/L (ref 3.5–5.1)

## 2011-02-08 LAB — CBC
HCT: 35.1 % — ABNORMAL LOW (ref 39.0–52.0)
MCH: 26.8 pg (ref 26.0–34.0)
MCV: 81.1 fL (ref 78.0–100.0)
Platelets: 247 10*3/uL (ref 150–400)
RBC: 4.33 MIL/uL (ref 4.22–5.81)
RDW: 13.2 % (ref 11.5–15.5)
WBC: 10.8 10*3/uL — ABNORMAL HIGH (ref 4.0–10.5)

## 2011-02-08 LAB — DIFFERENTIAL
Eosinophils Absolute: 0 10*3/uL (ref 0.0–0.7)
Eosinophils Relative: 0 % (ref 0–5)
Lymphocytes Relative: 8 % — ABNORMAL LOW (ref 12–46)
Lymphs Abs: 0.9 10*3/uL (ref 0.7–4.0)
Monocytes Absolute: 0.6 10*3/uL (ref 0.1–1.0)

## 2011-02-08 LAB — CARDIAC PANEL(CRET KIN+CKTOT+MB+TROPI)
CK, MB: 2.1 ng/mL (ref 0.3–4.0)
Total CK: 30 U/L (ref 7–232)

## 2011-02-08 MED ORDER — SODIUM CHLORIDE 0.9 % IV SOLN: Freq: Once | INTRAVENOUS | Status: DC

## 2011-02-08 MED ORDER — LORAZEPAM 2 MG/ML IJ SOLN
1.0000 mg | Freq: Once | INTRAMUSCULAR | Status: AC
  Administered 2011-02-08: 1 mg via INTRAVENOUS

## 2011-02-08 MED ORDER — LORAZEPAM 2 MG/ML IJ SOLN: 2.0000 mg | Freq: Once | INTRAMUSCULAR | Status: DC

## 2011-02-08 NOTE — ED Notes (Signed)
Pt had a witnessed grand mal seizure that lasted approximately 10 seconds in duration. Immediately after the seizure activity, pt was able to clearly state that he was in the ER for leg and arm pain. Pt also asked for something to eat again. Pt alert to self and place. Pt is presently watching tv.

## 2011-02-08 NOTE — ED Provider Notes (Signed)
History     CSN: [id number]  Arrival date & time 02/08/11  1953   First MD Initiated Contact with Patient 02/08/11 2006      Chief Complaint  Patient presents with  . Arm Pain  . Leg Pain    (Consider location/radiation/quality/duration/timing/severity/associated sxs/prior treatment) HPI Comments: EMS was called to a local restaurant to transport this patient after he reported he fell in the street.  He is homeless and is mentally challenged, cannot give concise birthdate medical history history of events, but is stating that his left arm and leg hurt.  Patient is a 56 y.o. male presenting with arm pain and leg pain. The history is provided by the patient.  Arm Pain This is a new problem. The current episode started today. The problem has been gradually worsening. Associated symptoms include chest pain.  Leg Pain     Past Medical History  Diagnosis Date  . Hypertension   . Myocardial infarct, old     History reviewed. No pertinent past surgical history.  History reviewed. No pertinent family history.  History  Substance Use Topics  . Smoking status: Not on file  . Smokeless tobacco: Not on file  . Alcohol Use:       Review of Systems  HENT: Negative for rhinorrhea.   Respiratory: Negative for shortness of breath.   Cardiovascular: Positive for chest pain.  Neurological: Negative for dizziness.  Psychiatric/Behavioral: Positive for confusion.    Allergies  Review of patient's allergies indicates not on file.  Home Medications  No current outpatient prescriptions on file.  BP 160/76  Pulse 80  Resp 18  Physical Exam  Constitutional: He appears well-developed and well-nourished.  HENT:  Head: Normocephalic.  Eyes: Pupils are equal, round, and reactive to light.  Neck: Normal range of motion.  Pulmonary/Chest: Effort normal.  Abdominal: Soft. He exhibits no distension.  Musculoskeletal: He exhibits no edema and no tenderness.  Neurological: He is  alert. No cranial nerve deficit. Coordination normal.  Skin: Skin is warm and dry. No rash noted.    ED Course  Procedures (including critical care time)   Labs Reviewed  CBC  DIFFERENTIAL  PROTIME-INR   No results found.   No diagnosis found.  ED ECG REPORT   Date: 02/08/2011  EKG Time: 10:15 PM  Rate: 59  Rhythm: normal sinus rhythm,  , there are no previous tracings available for comparison  Axis: normal  Intervals:none  ST&T Change: none  Narrative Interpretation: normal            MDM  This patient is difficult to assess due to his mental capacity. On subsequent examination, patient's right-sided face droop and weakness of his left hand.  Grip and inability to lift left leg off the bed. Physician asked to evaluate patient who obtained a completely different.  Physical exam.  Will pursue head CT.  Cardiac  Enzymes and routine labs  6 PM patient had 10 second seizure.  I will order Ativan to control this       Arman Filter, NP 02/08/11 2207  Arman Filter, NP 02/08/11 2219  Arman Filter, NP 02/08/11 2235

## 2011-02-08 NOTE — ED Notes (Signed)
Pt found to have right facial droop and slurred speech approximately 15 minutes post arrival. CT scan of brain ordered stat, two large bore IVs started with NS bolus infusing, and blood work collected. Pt at baseline with no facial droop upon arrival back from CT. Pt requesting something to eat. Informed pt that blood work and test results would need to be completed before he could have something to eat.

## 2011-02-08 NOTE — ED Provider Notes (Signed)
Medical screening examination/treatment/procedure(s) were performed by non-physician practitioner and as supervising physician I was immediately available for consultation/collaboration.   Deyon Chizek A. Matheson Vandehei, MD 02/08/11 2317 

## 2011-02-08 NOTE — ED Notes (Signed)
Pt homeless, pt at [facility] when he started having left arm and left leg pain. Therapist, sports called EMS. Pt appears mentally challenged and [facility] had difficult time getting history from patient. Vital signs stabe, per [facility].

## 2011-02-08 NOTE — ED Notes (Signed)
Urine sample sent to lab

## 2011-02-08 NOTE — ED Notes (Signed)
Pt able to communicate though unable to state correct birthday or residence. Pt had facial symmetry.

## 2011-02-08 NOTE — ED Notes (Signed)
[id number]<AV> Expected date:02/08/11<BR> Expected time: 7:41 PM<BR> Means of arrival:Ambulance<BR> Comments:<BR> EMS [facility] - abd pain

## 2011-02-09 ENCOUNTER — Other Ambulatory Visit (HOSPITAL_COMMUNITY): Payer: Medicare Other

## 2011-02-09 ENCOUNTER — Encounter (HOSPITAL_COMMUNITY): Payer: Self-pay | Admitting: *Deleted

## 2011-02-09 DIAGNOSIS — R071 Chest pain on breathing: Secondary | ICD-10-CM | POA: Diagnosis present

## 2011-02-09 DIAGNOSIS — R569 Unspecified convulsions: Secondary | ICD-10-CM | POA: Diagnosis present

## 2011-02-09 DIAGNOSIS — Z720 Tobacco use: Secondary | ICD-10-CM | POA: Diagnosis present

## 2011-02-09 DIAGNOSIS — I10 Essential (primary) hypertension: Secondary | ICD-10-CM | POA: Diagnosis present

## 2011-02-09 LAB — CBC
MCH: 26.2 pg (ref 26.0–34.0)
MCHC: 31.8 g/dL (ref 30.0–36.0)
Platelets: 243 10*3/uL (ref 150–400)
RDW: 13.5 % (ref 11.5–15.5)

## 2011-02-09 LAB — COMPREHENSIVE METABOLIC PANEL
ALT: 6 U/L (ref 0–53)
AST: 7 U/L (ref 0–37)
Albumin: 2.9 g/dL — ABNORMAL LOW (ref 3.5–5.2)
Alkaline Phosphatase: 59 U/L (ref 39–117)
Calcium: 9.5 mg/dL (ref 8.4–10.5)
GFR calc Af Amer: >90 mL/min (ref >90)
Potassium: 3.5 mEq/L (ref 3.5–5.1)
Sodium: 140 mEq/L (ref 135–145)
Total Protein: 6 g/dL (ref 6.0–8.3)

## 2011-02-09 LAB — IRON AND TIBC
Iron: 94 ug/dL (ref 42–135)
Saturation Ratios: 27 % (ref 20–55)
TIBC: 353 ug/dL (ref 215–435)

## 2011-02-09 LAB — FERRITIN: Ferritin: 16 ng/mL — ABNORMAL LOW (ref 22–322)

## 2011-02-09 LAB — D-DIMER, QUANTITATIVE: D-Dimer, Quant: 0.35 ug/mL-FEU (ref 0.00–0.48)

## 2011-02-09 MED ORDER — ONDANSETRON HCL 4 MG PO TABS: 4.0000 mg | ORAL_TABLET | Freq: Four times a day (QID) | ORAL | Status: DC | PRN

## 2011-02-09 MED ORDER — SODIUM CHLORIDE 0.9 % IJ SOLN
3.0000 mL | Freq: Two times a day (BID) | INTRAMUSCULAR | Status: DC
  Administered 2011-02-09 – 2011-02-13 (×10): 3 mL via INTRAVENOUS

## 2011-02-09 MED ORDER — METOPROLOL TARTRATE 12.5 MG HALF TABLET
12.5000 mg | ORAL_TABLET | Freq: Two times a day (BID) | ORAL | Status: DC
  Administered 2011-02-09 – 2011-02-10 (×3): 12.5 mg via ORAL
  Filled 2011-02-09 (×6): qty 1

## 2011-02-09 MED ORDER — ACETAMINOPHEN 650 MG RE SUPP: 650.0000 mg | Freq: Four times a day (QID) | RECTAL | Status: DC | PRN

## 2011-02-09 MED ORDER — DOCUSATE SODIUM 100 MG PO CAPS
100.0000 mg | ORAL_CAPSULE | Freq: Two times a day (BID) | ORAL | Status: DC
  Administered 2011-02-09 – 2011-02-13 (×9): 100 mg via ORAL
  Filled 2011-02-09 (×12): qty 1

## 2011-02-09 MED ORDER — HALOPERIDOL LACTATE 5 MG/ML IJ SOLN
1.0000 mg | Freq: Four times a day (QID) | INTRAMUSCULAR | Status: DC | PRN
  Administered 2011-02-10: 1 mg via INTRAMUSCULAR
  Filled 2011-02-09: qty 1

## 2011-02-09 MED ORDER — FLUOXETINE HCL 20 MG PO CAPS
40.0000 mg | ORAL_CAPSULE | Freq: Every day | ORAL | Status: DC
  Administered 2011-02-09 – 2011-02-13 (×5): 40 mg via ORAL
  Filled 2011-02-09 (×6): qty 2

## 2011-02-09 MED ORDER — ACETAMINOPHEN 325 MG PO TABS
650.0000 mg | ORAL_TABLET | Freq: Four times a day (QID) | ORAL | Status: DC | PRN
  Administered 2011-02-12: 650 mg via ORAL
  Filled 2011-02-09: qty 2

## 2011-02-09 MED ORDER — SENNA 8.6 MG PO TABS
1.0000 | ORAL_TABLET | Freq: Two times a day (BID) | ORAL | Status: DC
  Administered 2011-02-09 – 2011-02-13 (×8): 8.6 mg via ORAL
  Filled 2011-02-09 (×8): qty 1

## 2011-02-09 MED ORDER — TRAZODONE HCL 50 MG PO TABS
50.0000 mg | ORAL_TABLET | Freq: Every day | ORAL | Status: DC
  Administered 2011-02-09 – 2011-02-12 (×4): 50 mg via ORAL
  Filled 2011-02-09 (×6): qty 1

## 2011-02-09 MED ORDER — IPRATROPIUM BROMIDE 0.02 % IN SOLN: 0.5000 mg | Freq: Four times a day (QID) | RESPIRATORY_TRACT | Status: DC | PRN

## 2011-02-09 MED ORDER — ONDANSETRON HCL 4 MG/2ML IJ SOLN: 4.0000 mg | Freq: Four times a day (QID) | INTRAMUSCULAR | Status: DC | PRN

## 2011-02-09 MED ORDER — ALBUTEROL SULFATE (5 MG/ML) 0.5% IN NEBU: 2.5000 mg | INHALATION_SOLUTION | Freq: Four times a day (QID) | RESPIRATORY_TRACT | Status: DC | PRN

## 2011-02-09 MED ORDER — ALBUTEROL SULFATE (5 MG/ML) 0.5% IN NEBU
2.5000 mg | INHALATION_SOLUTION | Freq: Four times a day (QID) | RESPIRATORY_TRACT | Status: DC
  Filled 2011-02-09 (×2): qty 0.5

## 2011-02-09 MED ORDER — NICOTINE 14 MG/24HR TD PT24
14.0000 mg | MEDICATED_PATCH | Freq: Every day | TRANSDERMAL | Status: DC
  Administered 2011-02-09 – 2011-02-13 (×5): 14 mg via TRANSDERMAL
  Filled 2011-02-09 (×6): qty 1

## 2011-02-09 MED ORDER — LITHIUM CARBONATE 300 MG PO CAPS
300.0000 mg | ORAL_CAPSULE | Freq: Three times a day (TID) | ORAL | Status: DC
  Administered 2011-02-09 – 2011-02-13 (×13): 300 mg via ORAL
  Filled 2011-02-09 (×18): qty 1

## 2011-02-09 MED ORDER — IPRATROPIUM BROMIDE 0.02 % IN SOLN
0.5000 mg | Freq: Four times a day (QID) | RESPIRATORY_TRACT | Status: DC
  Filled 2011-02-09 (×2): qty 2.5

## 2011-02-09 NOTE — Progress Notes (Signed)
CSW visited with pt and attempted to complete psychosocial assessment. Pt was unable to state his name or any questions regarding his family, living situation or medical conditions. Pt answered "I don't know" to every question, even when CSW asked him how was lunch. Pt sitting [place] style on the bed with his with his finger in his mouth. Pt has a very flat affect, no eye contact. CSW called to express concerns about the pts mental capacity and was informed by Dr. Wandra Mannan that she has ordered a psych eval. Pt will be followed by either current CSW or psych CSW.  Ladene Artist N 02/09/2011 1:09 PM [phone]

## 2011-02-09 NOTE — Progress Notes (Signed)
Patient is calm  and quiet stated he got excited earlier.  lab was able to draw  specimen,patient is eating dinner at this time. Called Ryan from group home requesting for medication list per MD's request. Will monitor accordingly .

## 2011-02-09 NOTE — Progress Notes (Signed)
Subjective: Patient had many complaints today. He complains of pleuritic chest pain. He states that whenever he coughs or takes a deep breath it hurts. He complains of pain in his left lower extremity and left upper extremity. He complains of neck pain.  Objective: Weight change:   Intake/Output Summary (Last 24 hours) at 02/09/11 1219 Last data filed at 02/09/11 0920  Gross per 24 hour  Intake    483 ml  Output    300 ml  Net    183 ml    Filed Vitals:   02/09/11 0645  BP: 139/83  Pulse: 77  Temp: 98.1 F (36.7 C)  Resp: 18   general: Patient appears his stated age. HEENT: Head normocephalic. Lungs: Rhonchi throughout Abdomen: Positive bowel sounds Extremities: Mild edema in the left lower extremity.  Lab Results: Reviewed  Studies/Results: Ct Head Wo Contrast  02/08/2011  *RADIOLOGY REPORT*  Clinical Data: Right facial droop, left-sided weakness  CT HEAD WITHOUT CONTRAST  Technique:  Contiguous axial images were obtained from the base of the skull through the vertex without contrast.  Comparison: None available  Findings: Mild atrophy. There is no evidence of acute intracranial hemorrhage, brain edema, mass lesion, acute infarction,   mass effect, or midline shift. Acute infarct may be inapparent on noncontrast CT.  No other intra-axial abnormalities are seen, and the ventricles and sulci are within normal limits in size and symmetry.   No abnormal extra-axial fluid collections or masses are identified.  No significant calvarial abnormality.  IMPRESSION: 1. Negative for bleed or other acute intracranial process.  Original Report Authenticated By: Osa Craver, M.D.   Dg Chest Port 1 View  02/08/2011  *RADIOLOGY REPORT*  Clinical Data: 56 year old male with left-sided weakness.  PORTABLE CHEST - 1 VIEW  Comparison: None.  Findings: Portable semi upright AP view at 2125 hours.  Slightly shallow lung volumes.  Cardiac size and mediastinal contours are within normal limits.   No pneumothorax, pulmonary edema, pleural effusion or consolidation.  Mild retrocardiac opacity most resembles atelectasis. No acute osseous abnormality identified.  IMPRESSION: Mild retrocardiac atelectasis, no other acute cardiopulmonary abnormality.  Original Report Authenticated By: Harley Hallmark, M.D.   Medications: Scheduled Meds:   . albuterol  2.5 mg Nebulization Q6H  . docusate sodium  100 mg Oral BID  . FLUoxetine  40 mg Oral Daily  . ipratropium  0.5 mg Nebulization Q6H  . LORazepam  1 mg Intravenous Once  . metoprolol tartrate  12.5 mg Oral BID  . senna  1 tablet Oral BID  . sodium chloride  3 mL Intravenous Q12H  . traZODone  50 mg Oral QHS  . DISCONTD: sodium chloride   Intravenous Once  . DISCONTD: LORazepam  2 mg Intravenous Once   Continuous Infusions:  PRN Meds:.acetaminophen, acetaminophen, ondansetron (ZOFRAN) IV, ondansetron, DISCONTD: albuterol, DISCONTD: ipratropium  Assessment/Plan: Paranoid schizophrenia (02/09/2011) Patient has a history of Paranoid Schizophrenia on reviewing his records in E. Chart. He was on several medications when he was here in October. Will restart the Prozac. Consulted Psychiatry for further management with his schizophrenia.   HTN (hypertension) (02/09/2011)  On reviewing his old records. He was on Metoprolol in the past restarted Metoprolol.   Arm and leg pain (02/09/2011)  Unsure of the etiology of his arm and leg pain. He does have mild swelling in the left lower extremity. Will get Doppler ultrasound of the left lower extremity to rule out DVT.   COPD (chronic obstructive pulmonary disease) (  02/09/2011)  patient does have some rhonchi on exam. Will place him on schedule albuterol Atrovent nebulizer treatments. Will hold off on starting steroids as he is not wheezing.   Tobacco use (02/09/2011) Will order a nicotine patch.    Mental retardation (02/09/2011)  stable  Chest pain on breathing (02/09/2011) Patient complains of chest pain  with deep breathing. Will order d-dimer. He does have some rhonchi in his chest. Will schedule breathing treatments.  Seizure-like activity (02/09/2011)  Question of seizure-like activity. Will get EEG.   Homelessness (02/09/2011)  Social work consult, Patient does not even seem to know his name. He said his name is Darnelle Bos. Not sure how much of this is Psych related or secondary to mental retardation. Will await psych consult prior to doing any further workup.   LOS: 1 day   Carroll Ranney JARRETT 02/09/2011, 12:19 PM

## 2011-02-09 NOTE — ED Notes (Signed)
Oral Temp 98.2  Hr 85  B/P 154/97  R 22  Pulse Ox 96%

## 2011-02-09 NOTE — H&P (Signed)
PCP:   No primary provider on file.  Not confirmed   Chief Complaint:  Leg and arm pain  HPI: Male, unclear real name, identity or age, unclear past history.   The patient cannot give much history, seemingly due to what appears to be baseline cognitive  or psychiatric dysfunction. He states his name is [name], but can't spell it, can't  give his birthdate, and had no ID when he was picked up, so we do not know anything about who  this patient is.  Per discussion with ED staff, he walked into a restaurant earlier tonight, in the rain,  barefoot, and was drinking some water. He complained of left leg and left arm pain, and they  called EMS, however he was unable to give any history to EMS, but his vitals were stable.   Vitals in the ED stable. Labs with normal chemistry panel, normal renal 14/0.6. Ionized Ca  low at 1.33. Cardiac enzymes negative x1. WBC 10.8 with 86% neutros. Hct 35-36. plts normal.  INR normal. CXR with retrocardiac atelectasis, otherwise normal. CT head negative for acute  process.   While in the ED, there was some concern for R facial droop, but this was apparently transient  or not substantiated. Then, he was seen to have possible 10 second seizure for which he was  given Ativan, but per discussion with nursing who witnessed it, not sure if it was truly  seizure either.   Currently, pt states he has arm pain, leg pain, arm and leg tingling, but doesn't really  point to where his symptoms are, doesn't give any specific or useful information. He states  he's at [facility] where he was recently admitted. He states he lives on the street, then  states he left his medications at home, and when asked to clarify, is not really able. He  states he has no family, no friends, nothing. ROS o/w unobtainable.   Past Medical History  Diagnosis Date  . Hypertension   . Myocardial infarct, old     History reviewed. No pertinent past surgical  history.  Medications:  HOME MEDS: Can't reconcile Prior to Admission medications   Not on File   Allergies:  No Known Allergies  Social History:   does not have a smoking history on file. He does not have any smokeless tobacco history on file. His alcohol and drug histories not on file. Can't reconcile  Family History: History reviewed. No pertinent family history.  Physical Exam: Filed Vitals:   02/08/11 2001  BP: 160/76  Pulse: 80  Resp: 18   Blood pressure 160/76, pulse 80, resp. rate 18.  Gen: Tall, thin, M in no distress but grossly odd affect, talking loudly and with wild stare  in his eye. Appears to have some aphasia, speech is not fluent and he stutters, stumbles over  words quite a lot, cannot get out full sentences. No cardiopulmonary distress, no apparent  pain. Overall he appears to have cognitive or psychiatric disability HEENT: R eye with sceral hemorrhage, and he has left eye exotropia. EOM testing quite  difficult by his ability to follow commands, but grossly they seem intact, without nystagmus.  Mouth is moist and grossly normal appearing Lungs: Diffuse expiratory wheezing and very rough, coarse breath sounds bilaterally Heart: regular, without tachycardia, no m/g appreciated.  Abd: Soft, scaphoid, non tender, non distended, normal exam Extrem: thin, decreased bulk, but non tender despite aggressive palpation, no BLE edema,  warm, perfusing normally Neuro: Alert  awake, attentive to conversation and answers questions, oriented to [employer].  Moves extremities    Labs & Imaging Results for orders placed during the hospital encounter of 02/08/11 (from the past 48 hour(s))  CARDIAC PANEL(CRET KIN+CKTOT+MB+TROPI)     Status: Normal   Collection Time   02/08/11  8:55 PM      Component Value Range Comment   Total CK 30  7 - 232 (U/L)    CK, MB 2.1  0.3 - 4.0 (ng/mL)    Troponin I <0.30  <0.30 (ng/mL)    Relative Index RELATIVE INDEX IS INVALID  0.0 - 2.5     CBC     Status: Abnormal   Collection Time   02/08/11  8:55 PM      Component Value Range Comment   WBC 10.8 (*) 4.0 - 10.5 (K/uL)    RBC 4.33  4.22 - 5.81 (MIL/uL)    Hemoglobin 11.6 (*) 13.0 - 17.0 (g/dL)    HCT 16.1 (*) 09.6 - 52.0 (%)    MCV 81.1  78.0 - 100.0 (fL)    MCH 26.8  26.0 - 34.0 (pg)    MCHC 33.0  30.0 - 36.0 (g/dL)    RDW 04.5  40.9 - 81.1 (%)    Platelets 247  150 - 400 (K/uL)   DIFFERENTIAL     Status: Abnormal   Collection Time   02/08/11  8:55 PM      Component Value Range Comment   Neutrophils Relative 86 (*) 43 - 77 (%)    Neutro Abs 9.3 (*) 1.7 - 7.7 (K/uL)    Lymphocytes Relative 8 (*) 12 - 46 (%)    Lymphs Abs 0.9  0.7 - 4.0 (K/uL)    Monocytes Relative 6  3 - 12 (%)    Monocytes Absolute 0.6  0.1 - 1.0 (K/uL)    Eosinophils Relative 0  0 - 5 (%)    Eosinophils Absolute 0.0  0.0 - 0.7 (K/uL)    Basophils Relative 0  0 - 1 (%)    Basophils Absolute 0.0  0.0 - 0.1 (K/uL)   PROTIME-INR     Status: Normal   Collection Time   02/08/11  8:55 PM      Component Value Range Comment   Prothrombin Time 12.5  11.6 - 15.2 (seconds)    INR 0.91  0.00 - 1.49    POCT I-STAT TROPONIN I     Status: Normal   Collection Time   02/08/11  9:05 PM      Component Value Range Comment   Troponin i, poc 0.00  0.00 - 0.08 (ng/mL)    Comment 3            POCT I-STAT, CHEM 8     Status: Abnormal   Collection Time   02/08/11  9:06 PM      Component Value Range Comment   Sodium 141  135 - 145 (mEq/L)    Potassium 4.3  3.5 - 5.1 (mEq/L)    Chloride 108  96 - 112 (mEq/L)    BUN 14  6 - 23 (mg/dL)    Creatinine, Ser 9.14  0.50 - 1.35 (mg/dL)    Glucose, Bld 782 (*) 70 - 99 (mg/dL)    Calcium, Ion 9.56 (*) 1.12 - 1.32 (mmol/L)    TCO2 26  0 - 100 (mmol/L)    Hemoglobin 12.2 (*) 13.0 - 17.0 (g/dL)    HCT 21.3 (*) 08.6 - 52.0 (%)  Ct Head Wo Contrast  02/08/2011  *RADIOLOGY REPORT*  Clinical Data: Right facial droop, left-sided weakness  CT HEAD WITHOUT CONTRAST  Technique:   Contiguous axial images were obtained from the base of the skull through the vertex without contrast.  Comparison: None available  Findings: Mild atrophy. There is no evidence of acute intracranial hemorrhage, brain edema, mass lesion, acute infarction,   mass effect, or midline shift. Acute infarct may be inapparent on noncontrast CT.  No other intra-axial abnormalities are seen, and the ventricles and sulci are within normal limits in size and symmetry.   No abnormal extra-axial fluid collections or masses are identified.  No significant calvarial abnormality.  IMPRESSION: 1. Negative for bleed or other acute intracranial process.  Original Report Authenticated By: Osa Craver, M.D.   Dg Chest Port 1 View  02/08/2011  *RADIOLOGY REPORT*  Clinical Data: 56 year old male with left-sided weakness.  PORTABLE CHEST - 1 VIEW  Comparison: None.  Findings: Portable semi upright AP view at 2125 hours.  Slightly shallow lung volumes.  Cardiac size and mediastinal contours are within normal limits.  No pneumothorax, pulmonary edema, pleural effusion or consolidation.  Mild retrocardiac opacity most resembles atelectasis. No acute osseous abnormality identified.  IMPRESSION: Mild retrocardiac atelectasis, no other acute cardiopulmonary abnormality.  Original Report Authenticated By: Ulla Potash III, M.D.   ECG: NSR 59 bpm, normal axis, normal P waves, narrow QRS, V3-5 upwardly concave STE with  terminal QRS notching (likely benign), normal T waves.   Impression Present on Admission:  .Arm and leg pain .Seizure-like activity  Male of unclear age, identity, history, presents after showing up at a restaurant with  leg/arm pain and in the ED possibly with facial droop, possibly with seizure activity.  1. Unclear history of ? seizures: This isn't entirely clear if this was true seizure vs  pseudoseizures vs nothing at all. He's certainly not postictal at this time, and nursing who  witnessed this states  he was rolled over, slobbering, but didn't lose consciousness, without  any tonic/clonic motions, and wasn't postictal afterwards. He has no true neuro deficits at  present.   - Monitor for seizures/mental status, seizure precautions. For now will hold off on  MRI/EEG/neuro consultation until more collateral information can be gathered, as I'm not  really sure what's going on.  - Neuro checks   2. Social: We do not even know his name, age, past medical. However, he states he was  recently admitted to [facility], and has a left arm nicotine patch dated 2/4, so this is probable.  - SW consultation, and have asked nursing to continue looking into this - Consider Psych testing for competency  3. Arm/leg pain: Examination of his arms and legs is pretty unremarkable and I palpated them  all fairly hard, without any complaints. So, would monitor this for now.   4. Minimal leukocytosis: CXR clear for PNA, will get UA and monitor for fevers.   5. HypoCa: replete with IV calcium.   6. Wheezy sounding lungs: CXR is clear, will give some nebs.   Telemetry, [facility] team 2  Other plans as per orders.  Keoshia Steinmetz 02/09/2011, 12:42 AM

## 2011-02-09 NOTE — Progress Notes (Signed)
Patient came in with Seizure-like Activity & refuses to allow nursing staff to apply bed alarm. Charge Nurse Tresa Endo) attempted to apply the bed alarm & the patient also refused & became agitated. Bennetta Laos, RN
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
## 2011-02-09 NOTE — Evaluation (Signed)
Physical Therapy Evaluation Patient Details Name: [name] MRN: [id number] DOB: [birth year] Today's Date: 02/09/2011  Problem List:  Patient Active Problem List  Diagnoses  . Arm and leg pain  . Seizure-like activity  . Homelessness  . Paranoid schizophrenia  . HTN (hypertension)  . COPD (chronic obstructive pulmonary disease)  . Tobacco use  . Mental retardation  . Chest pain on breathing    Past Medical History:  Past Medical History  Diagnosis Date  . Hypertension   . Myocardial infarct, old   . Seizures    Past Surgical History: History reviewed. No pertinent past surgical history.  PT Assessment/Plan/Recommendation PT Assessment Clinical Impression Statement: Pt presents with diagnosis of L UE/LE pain. Noted in history that pt is homeless and has history of cognitive impairments. Pt currently requiring MinA and RW for safe mobility. D/C plans undecided at this time. Recommend SNF for rehab for safety and to maximize independence with basic functional mobility. PT Recommendation/Assessment: Patient will need skilled PT in the acute care venue PT Problem List: Decreased mobility;Decreased activity tolerance;Pain;Decreased knowledge of use of DME;Decreased safety awareness;Decreased cognition Barriers to Discharge: Decreased caregiver support PT Therapy Diagnosis : Difficulty walking;Acute pain;Abnormality of gait PT Plan PT Frequency: Min 3X/week PT Treatment/Interventions: DME instruction;Gait training;Functional mobility training;Therapeutic activities;Therapeutic exercise;Patient/family education PT Recommendation Recommendations for Other Services: OT consult Follow Up Recommendations: Skilled nursing facility Equipment Recommended: Defer to next venue PT Goals  Acute Rehab PT Goals PT Goal Formulation: Patient unable to participate in goal setting Time For Goal Achievement: 2 weeks Pt will go Sit to Stand: with modified independence PT Goal: Sit to Stand -  Progress: Goal set today Pt will go Stand to Sit: with modified independence PT Goal: Stand to Sit - Progress: Goal set today Pt will Ambulate: >150 feet;with modified independence;with least restrictive assistive device PT Goal: Ambulate - Progress: Goal set today  PT Evaluation Precautions/Restrictions  Precautions Precautions: Fall Prior Functioning  Home Living Type of Home: Homeless Prior Function Comments: Pt states he did not use RW or cane. Cognition Cognition Arousal/Alertness: Awake/alert Overall Cognitive Status: Impaired Memory: Appears impaired Orientation Level: Disoriented to situation;Oriented to time;Disoriented to place Safety/Judgement: Decreased safety judgement for tasks assessed Sensation/Coordination Sensation Light Touch: Appears Intact Coordination Gross Motor Movements are Fluid and Coordinated: Yes Extremity Assessment RLE Assessment RLE Assessment: Within Functional Limits LLE Assessment LLE Assessment: Not tested Mobility (including Balance) Bed Mobility Bed Mobility: Yes Supine to Sit: 6: Modified independent (Device/Increase time) Transfers Transfers: Yes Sit to Stand Details (indicate cue type and reason): Min-guard assist. VCs safety, hand placement.  Stand to Sit Details: Min-guard assist. VCs safety, hand placement. Stand Pivot Transfers: 4: Min assist Stand Pivot Transfer Details (indicate cue type and reason): VCs safety, technique, safe use of RW. Assist to steady.  Ambulation/Gait Ambulation/Gait: Yes Ambulation/Gait Assistance: 4: Min assist Ambulation/Gait Assistance Details (indicate cue type and reason): Attempted ambulation without AD but pt was unable to walk without limping and reaching out for objects to hold onto. Gait improved with RW. Pt self-limiting WBing on L LE: progressed from NWB to PWB gait pattern. Assist to steady.  Ambulation Distance (Feet): 125 Feet Assistive device: Rolling walker Gait Pattern:  Antalgic;Decreased stance time - left;Step-through pattern;Decreased stride length  Posture/Postural Control Posture/Postural Control: No significant limitations Exercise    End of Session PT - End of Session Equipment Utilized During Treatment: Gait belt Activity Tolerance: Patient limited by pain Patient left: in chair;with call bell in reach (with chair alarm)  General Behavior During Session: Flat affect Cognition: Impaired, at baseline  [name] Alert [facility] 02/09/2011, 1:37 PM [phone]

## 2011-02-10 ENCOUNTER — Other Ambulatory Visit: Payer: Self-pay

## 2011-02-10 DIAGNOSIS — M7989 Other specified soft tissue disorders: Secondary | ICD-10-CM

## 2011-02-10 DIAGNOSIS — M79609 Pain in unspecified limb: Secondary | ICD-10-CM

## 2011-02-10 LAB — BASIC METABOLIC PANEL
GFR calc Af Amer: >90 mL/min (ref >90)
GFR calc non Af Amer: >90 mL/min (ref >90)
Glucose, Bld: 87 mg/dL (ref 70–99)
Potassium: 3.6 mEq/L (ref 3.5–5.1)
Sodium: 140 mEq/L (ref 135–145)

## 2011-02-10 LAB — CBC
Hemoglobin: 11.3 g/dL — ABNORMAL LOW (ref 13.0–17.0)
MCHC: 32.2 g/dL (ref 30.0–36.0)
Platelets: 287 10*3/uL (ref 150–400)

## 2011-02-10 MED ORDER — ALBUTEROL SULFATE (5 MG/ML) 0.5% IN NEBU: 2.5000 mg | INHALATION_SOLUTION | RESPIRATORY_TRACT | Status: DC | PRN

## 2011-02-10 MED ORDER — IPRATROPIUM BROMIDE 0.02 % IN SOLN: 0.5000 mg | RESPIRATORY_TRACT | Status: DC | PRN

## 2011-02-10 NOTE — Progress Notes (Signed)
[facility] hospitalist progress note. Chief complaint.?? Seizure activity. History of present illness. This 56 year old male who was admitted with the arm and leg pain and possible seizure activity. Patient is for an EEG which is pending. Staff notes the patient was having some arm and leg movements while in bed. He went to check on him and found him quite lethargic this suggests a possible seizure activity with a postictal state. I was notified and came up to see the patient at the bedside. I find him alert and sitting at the side of the bed. I asked him how he felt and he states "I had a seizure". I asked him how he could tell that and he said he didn't know. He is not a very good historian given his mental retardation and schizophrenia. He currently denies any kind of focal neurologic changes. He appears alert and nursing tells me that he is at his normal baseline. Vital signs. Temperature 90.8, pulse 52, respiration 20, blood pressure 155/78. O2 sats 93%. Gen. appearance. Thin frail elderly male in no distress. He is alert and cooperative. Cardiac. Rate and rhythm regular. Lungs. Course of rhonchi mainly through the left lung fields. No distress. Abdomen. Soft with positive bowel sounds. No pain. Neurologic. Patient is alert and appears oriented. His grip strengths are weak but equal. His leg strengths are weak but equal. I find no focal neurologic changes at this time. Impression/plan. Problem #1. Possible seizure activity. Unfortunately this was not witnessed by me. The report is somewhat sketchy but involves the arm and leg movements while laying in bed. This followed by a period of lethargy which was resolved by the time I saw the patient. Suggestive of a postictal state. I see that the patient is for an EEG. No further orders at this time. Nursing will monitor and update me as needed.

## 2011-02-10 NOTE — Progress Notes (Signed)
Paged MD regarding patients sustained HR in the 40's.  MD ordered ECG to rule out block.  Strip confirms marked sinus bradycardia.  Patient asymptomatic although drowsy.

## 2011-02-10 NOTE — Progress Notes (Signed)
Pt now alert and oriented x 2. Answers some questions appropriately. Pt fully awake. Safety precautions in place, bed alarm on. Will monitor.

## 2011-02-10 NOTE — Progress Notes (Signed)
Rt D/C nebs due to pt refusing.

## 2011-02-10 NOTE — Progress Notes (Signed)
Subjective: Denies any pain. Awaiting psych consult  Objective: Weight change:   Intake/Output Summary (Last 24 hours) at 02/10/11 1930 Last data filed at 02/10/11 1700  Gross per 24 hour  Intake   1280 ml  Output      0 ml  Net   1280 ml    Filed Vitals:   02/10/11 1700  BP: 147/81  Pulse: 46  Temp:   Resp:    On exam he is alert afebrile cvs bradycardic Lungs clear abd soft nontender bowel sounds heard Extremities: no edema.  Lab Results: Results for orders placed during the hospital encounter of 02/08/11 (from the past 24 hour(s))  CBC     Status: Abnormal   Collection Time   02/10/11  5:10 AM      Component Value Range   WBC 8.8  4.0 - 10.5 (K/uL)   RBC 4.28  4.22 - 5.81 (MIL/uL)   Hemoglobin 11.3 (*) 13.0 - 17.0 (g/dL)   HCT 78.2 (*) 95.6 - 52.0 (%)   MCV 82.0  78.0 - 100.0 (fL)   MCH 26.4  26.0 - 34.0 (pg)   MCHC 32.2  30.0 - 36.0 (g/dL)   RDW 21.3  08.6 - 57.8 (%)   Platelets 287  150 - 400 (K/uL)  BASIC METABOLIC PANEL     Status: Normal   Collection Time   02/10/11  5:10 AM      Component Value Range   Sodium 140  135 - 145 (mEq/L)   Potassium 3.6  3.5 - 5.1 (mEq/L)   Chloride 106  96 - 112 (mEq/L)   CO2 28  19 - 32 (mEq/L)   Glucose, Bld 87  70 - 99 (mg/dL)   BUN 11  6 - 23 (mg/dL)   Creatinine, Ser 4.69  0.50 - 1.35 (mg/dL)   Calcium 9.4  8.4 - 62.9 (mg/dL)   GFR calc non Af Amer >90  >90 (mL/min)   GFR calc Af Amer >90  >90 (mL/min)     Micro Results: No results found for this or any previous visit (from the past 240 hour(s)).  Studies/Results: Ct Head Wo Contrast  02/08/2011  *RADIOLOGY REPORT*  Clinical Data: Right facial droop, left-sided weakness  CT HEAD WITHOUT CONTRAST  Technique:  Contiguous axial images were obtained from the base of the skull through the vertex without contrast.  Comparison: None available  Findings: Mild atrophy. There is no evidence of acute intracranial hemorrhage, brain edema, mass lesion, acute infarction,   mass  effect, or midline shift. Acute infarct may be inapparent on noncontrast CT.  No other intra-axial abnormalities are seen, and the ventricles and sulci are within normal limits in size and symmetry.   No abnormal extra-axial fluid collections or masses are identified.  No significant calvarial abnormality.  IMPRESSION: 1. Negative for bleed or other acute intracranial process.  Original Report Authenticated By: Osa Craver, M.D.   Dg Chest Port 1 View  02/08/2011  *RADIOLOGY REPORT*  Clinical Data: 56 year old male with left-sided weakness.  PORTABLE CHEST - 1 VIEW  Comparison: None.  Findings: Portable semi upright AP view at 2125 hours.  Slightly shallow lung volumes.  Cardiac size and mediastinal contours are within normal limits.  No pneumothorax, pulmonary edema, pleural effusion or consolidation.  Mild retrocardiac opacity most resembles atelectasis. No acute osseous abnormality identified.  IMPRESSION: Mild retrocardiac atelectasis, no other acute cardiopulmonary abnormality.  Original Report Authenticated By: Harley Hallmark, M.D.   Medications: Scheduled Meds:   .  docusate sodium  100 mg Oral BID  . FLUoxetine  40 mg Oral Daily  . lithium carbonate  300 mg Oral TID WC  . nicotine  14 mg Transdermal Daily  . senna  1 tablet Oral BID  . sodium chloride  3 mL Intravenous Q12H  . traZODone  50 mg Oral QHS  . DISCONTD: albuterol  2.5 mg Nebulization Q6H  . DISCONTD: ipratropium  0.5 mg Nebulization Q6H  . DISCONTD: metoprolol tartrate  12.5 mg Oral BID   Continuous Infusions:  PRN Meds:.acetaminophen, acetaminophen, albuterol, haloperidol lactate, ipratropium, ondansetron (ZOFRAN) IV, ondansetron  Assessment/Plan: Patient Active Hospital Problem List: Paranoid schizophrenia (02/09/2011) Patient has a history of Paranoid Schizophrenia on reviewing his records in E. Chart. He was on several medications when he was here in October. Will restart the Prozac. Consulted Psychiatry for  further management with his schizophrenia.   HTN (hypertension) (02/09/2011) BETTER controlled Metoprolol stopped for bradycardia  Arm and leg pain (02/09/2011) Unsure of the etiology of his arm and leg pain. He does have mild swelling in the left lower extremity dopplers negative for DVT COPD (chronic obstructive pulmonary disease) (02/09/2011) patient does have some rhonchi on exam. Will place him on schedule albuterol Atrovent nebulizer treatments. Will hold off on starting steroids as he is not wheezing.  Tobacco use (02/09/2011)  Will order a nicotine patch.  Mental retardation (02/09/2011) stable  Chest pain on breathing (02/09/2011) RESOLVED.   Seizure-like activity (02/09/2011) Question of seizure-like activity. Will get EEG.  Homelessness (02/09/2011) Social work consult,  Will await psych consult prior to doing any further workup.     LOS: 2 days   Jola Critzer 02/10/2011, 7:30 PM

## 2011-02-10 NOTE — Progress Notes (Signed)
VASCULAR LAB PRELIMINARY  PRELIMINARY  PRELIMINARY  PRELIMINARY  Left lower extremity venous Doppler completed.    Preliminary report:  No DVT or SVT noted in the left lower extremity.  Sherren K[place], 02/10/2011, 9:11 AM

## 2011-02-10 NOTE — Progress Notes (Signed)
Nursing Assistant notified writer that she witnessed pt to have "jerking movements" while laying in the bed. She felt that it appeared to be seizure activity. She stated that it lasted for a few seconds and then she went to Lexicographer. Upon assessment, patient difficult to arouse, moans to sternal rub, appears to be in a deep sleep. Pt also incontinent at this time. VS:155/78, hr 52, T 98, R 20, 93% ON RA. Lenny Pastel, NP notified of event, will monitor.

## 2011-02-11 DIAGNOSIS — F200 Paranoid schizophrenia: Secondary | ICD-10-CM

## 2011-02-11 LAB — GLUCOSE, CAPILLARY: Glucose-Capillary: 84 mg/dL (ref 70–99)

## 2011-02-11 LAB — LITHIUM LEVEL: Lithium Lvl: 0.56 mEq/L — ABNORMAL LOW (ref 0.80–1.40)

## 2011-02-11 MED ORDER — NAPHAZOLINE-GLYCERIN 0.012-0.2 % OP SOLN: 1.0000 [drp] | OPHTHALMIC | Status: DC | PRN

## 2011-02-11 MED ORDER — NAPHAZOLINE HCL 0.1 % OP SOLN
1.0000 [drp] | Freq: Four times a day (QID) | OPHTHALMIC | Status: DC | PRN
  Administered 2011-02-11 (×2): 1 [drp] via OPHTHALMIC
  Filled 2011-02-11: qty 15

## 2011-02-11 MED ORDER — HALOPERIDOL 2 MG PO TABS: 2.0000 mg | ORAL_TABLET | Freq: Every evening | ORAL | Status: DC | PRN

## 2011-02-11 MED ORDER — HALOPERIDOL 2 MG PO TABS
2.0000 mg | ORAL_TABLET | Freq: Two times a day (BID) | ORAL | Status: DC
  Administered 2011-02-11 – 2011-02-12 (×2): 2 mg via ORAL
  Filled 2011-02-11 (×5): qty 1

## 2011-02-11 NOTE — Progress Notes (Signed)
Subjective:  he is not agitated. OVERNIGHT he had an episode of AMS, but resolved spontanously. He is not aware of the episode.   Objective: Weight change:   Intake/Output Summary (Last 24 hours) at 02/11/11 2008 Last data filed at 02/11/11 1300  Gross per 24 hour  Intake    840 ml  Output   1000 ml  Net   -160 ml    Filed Vitals:   02/11/11 1400  BP: 125/71  Pulse: 60  Temp: 97.8 F (36.6 C)  Resp: 18   On exam he is alert afebrile  cvs s1 s2 heard  Lungs clear  abd soft nontender bowel sounds heard  Extremities: no edema.   Lab Results: Results for orders placed during the hospital encounter of 02/08/11 (from the past 24 hour(s))  GLUCOSE, CAPILLARY     Status: Normal   Collection Time   02/11/11  8:58 AM      Component Value Range   Glucose-Capillary 84  70 - 99 (mg/dL)    Micro Results: No results found for this or any previous visit (from the past 240 hour(s)).  Studies/Results: Ct Head Wo Contrast  02/08/2011  *RADIOLOGY REPORT*  Clinical Data: Right facial droop, left-sided weakness  CT HEAD WITHOUT CONTRAST  Technique:  Contiguous axial images were obtained from the base of the skull through the vertex without contrast.  Comparison: None available  Findings: Mild atrophy. There is no evidence of acute intracranial hemorrhage, brain edema, mass lesion, acute infarction,   mass effect, or midline shift. Acute infarct may be inapparent on noncontrast CT.  No other intra-axial abnormalities are seen, and the ventricles and sulci are within normal limits in size and symmetry.   No abnormal extra-axial fluid collections or masses are identified.  No significant calvarial abnormality.  IMPRESSION: 1. Negative for bleed or other acute intracranial process.  Original Report Authenticated By: Osa Craver, M.D.   Dg Chest Port 1 View  02/08/2011  *RADIOLOGY REPORT*  Clinical Data: 56 year old male with left-sided weakness.  PORTABLE CHEST - 1 VIEW  Comparison: None.   Findings: Portable semi upright AP view at 2125 hours.  Slightly shallow lung volumes.  Cardiac size and mediastinal contours are within normal limits.  No pneumothorax, pulmonary edema, pleural effusion or consolidation.  Mild retrocardiac opacity most resembles atelectasis. No acute osseous abnormality identified.  IMPRESSION: Mild retrocardiac atelectasis, no other acute cardiopulmonary abnormality.  Original Report Authenticated By: Harley Hallmark, M.D.   Medications: Scheduled Meds:   . docusate sodium  100 mg Oral BID  . FLUoxetine  40 mg Oral Daily  . lithium carbonate  300 mg Oral TID WC  . nicotine  14 mg Transdermal Daily  . senna  1 tablet Oral BID  . sodium chloride  3 mL Intravenous Q12H  . traZODone  50 mg Oral QHS   Continuous Infusions:  PRN Meds:.acetaminophen, acetaminophen, albuterol, haloperidol lactate, ipratropium, naphazoline, ondansetron (ZOFRAN) IV, ondansetron, DISCONTD: naphazoline-glycerin  Assessment/Plan:  Patient Active Hospital Problem List: Paranoid schizophrenia (02/09/2011) Patient has a history of Paranoid Schizophrenia on reviewing his records in E. Chart. He was on several medications when he was here in October. Will restart the Prozac. Consulted Psychiatry for further management with his schizophrenia.   HTN (hypertension) (02/09/2011) BETTER controlled  Metoprolol stopped for bradycardia  Arm and leg pain (02/09/2011) Unsure of the etiology of his arm and leg pain. He does have mild swelling in the left lower extremity dopplers negative for DVT  COPD (chronic obstructive pulmonary disease) (02/09/2011) Lungs sound clear. Will place him on schedule albuterol Atrovent nebulizer treatments. Will hold off on starting steroids as he is not wheezing.  Tobacco use (02/09/2011)  Will order a nicotine patch.  Mental retardation (02/09/2011) stable  Chest pain on breathing (02/09/2011) RESOLVED.  Seizure-like activity (02/09/2011) Question of seizure-like  activity. Will get EEG.  Homelessness (02/09/2011) Social work consult, Appreciate psych consult.  LOS: 3 days   [name] 02/11/2011, 8:08 PM

## 2011-02-11 NOTE — Progress Notes (Signed)
CSW spoke with Nonnie Done (cell#: [id number]) of [facility] where patient is from. Alycia Rossetti stated patient is ok to return back when medically stable. FL2 completed.   Unice Bailey, LCSWA [phone]

## 2011-02-11 NOTE — Consult Note (Addendum)
Patient Identification:  [name] Date of Evaluation:  02/11/2011   History of Present Illness: Patient is a 56 year old Caucasian male who was admitted on the medical floor with a chief complaint of leg and arm pain. Patient is a poor historian. He did not provide much detail. However he does admit history of psychiatric illness. Patient told he was admitted because he believed he has a seizure or stroke. As mentioned above patient was difficult to provide much detail. Patient told he has long history of psychiatric illness. He is taking lithium psychiatrist through [facility]. He does not remember when was the last time he saw psychiatrist. Patient was noticed talking to himself. He was easily distracted and difficult to engage in conversation. He admitted sometime hearing voices and seeing things. As per chart [facility]. He was diagnosed with paranoid schizophrenia. At that time he was taking Zyprexa. Patient told he lives with his friend in [place]. He was found barefoot last night in the rain.   Past Psychiatric History: As mentioned above patient has history of psychiatric illness with at least one psychiatric admission he do not recall any history of suicidal attempt however admitted hearing voices paranoid thinking and agitation. Patient told he has been living in the group home until recently the group home let him go as he was doing good. He decided to live with his friend in [place]. Patient to call his family member her deceased. As per chart she has history of admitting at [facility] [address] in [place] [facility] in [facility].   Past Medical History:     Past Medical History  Diagnosis Date  . Hypertension   . Myocardial infarct, old   . Seizures       History reviewed. No pertinent past surgical history.  Allergies: No Known Allergies  Current Medications:  Prior to Admission  medications   Not on File    Social History:    reports that he has been smoking Cigarettes.  He has been smoking about 2 packs per day. He does not have any smokeless tobacco history on file. He reports that he does not drink alcohol or use illicit drugs.   Family History:    History reviewed. No pertinent family history.   Mental status examination Patient is a poor historian, he is poorly groomed and maintained a poor eye contact. He has a heavy accent. His speech is pressured loud at times. He is seen some time talking to himself at appears he is responding to internal stimuli. His thought processes tangential. He admitted hearing voices however did not provide much detail. He is guarded and withdraw. His attention and concentration is poor. He denies any active or passive suicidal thoughts or homicidal thoughts. He is alert and oriented times. He believe it is 2000 and January 9. His fund of knowledge is below normal. His insight judgment and impulse control is limited.   DIAGNOSIS:   AXIS I   schizophrenia chronic paranoid type   AXIS II  borderline intellectual   AXIS III See medical notes.  AXIS IV other psychosocial or environmental problems, problems related to social environment and problems with primary support group  AXIS V 51-60 moderate symptoms     Recommendations: Consider starting low-dose Haldol 2 mg twice a day to target his paranoia and mood lability if it is not medically contraindicated. Get collateral information from [place] [facility] including his current medication.  Patient uses [facility]. Get lithium level. Consultation liaison service will follow.   Kathryne Sharper, MD Psychiatrist

## 2011-02-12 ENCOUNTER — Inpatient Hospital Stay (HOSPITAL_COMMUNITY)
Admit: 2011-02-12 | Discharge: 2011-02-12 | Disposition: A | Discharge status: Home / Self Care | Payer: Medicare Other | Attending: Internal Medicine | Admitting: Internal Medicine

## 2011-02-12 DIAGNOSIS — R569 Unspecified convulsions: Secondary | ICD-10-CM

## 2011-02-12 MED ORDER — RISPERIDONE 2 MG PO TABS
2.0000 mg | ORAL_TABLET | Freq: Every day | ORAL | Status: DC
  Administered 2011-02-12: 2 mg via ORAL
  Filled 2011-02-12 (×3): qty 1

## 2011-02-12 MED ORDER — RISPERIDONE 1 MG PO TABS
1.0000 mg | ORAL_TABLET | Freq: Two times a day (BID) | ORAL | Status: DC
  Administered 2011-02-12 – 2011-02-13 (×2): 1 mg via ORAL
  Filled 2011-02-12 (×5): qty 1

## 2011-02-12 NOTE — Progress Notes (Signed)
UR completed 

## 2011-02-12 NOTE — Progress Notes (Signed)
Subjective: He is no longer agitated. Changed the medications today.  Objective: Weight change:   Intake/Output Summary (Last 24 hours) at 02/12/11 2147 Last data filed at 02/12/11 1821  Gross per 24 hour  Intake   1600 ml  Output      0 ml  Net   1600 ml    Filed Vitals:   02/12/11 1450  BP: 130/82  Pulse: 64  Temp: 98.3 F (36.8 C)  Resp: 20   On exam he is alert afebrile  cvs s1 s2 heard  Lungs clear  abd soft nontender bowel sounds heard  Extremities: no edema.   Lab Results: No results found for this or any previous visit (from the past 24 hour(s)).   Micro Results: No results found for this or any previous visit (from the past 240 hour(s)).  Studies/Results: Ct Head Wo Contrast  02/08/2011  *RADIOLOGY REPORT*  Clinical Data: Right facial droop, left-sided weakness  CT HEAD WITHOUT CONTRAST  Technique:  Contiguous axial images were obtained from the base of the skull through the vertex without contrast.  Comparison: None available  Findings: Mild atrophy. There is no evidence of acute intracranial hemorrhage, brain edema, mass lesion, acute infarction,   mass effect, or midline shift. Acute infarct may be inapparent on noncontrast CT.  No other intra-axial abnormalities are seen, and the ventricles and sulci are within normal limits in size and symmetry.   No abnormal extra-axial fluid collections or masses are identified.  No significant calvarial abnormality.  IMPRESSION: 1. Negative for bleed or other acute intracranial process.  Original Report Authenticated By: Osa Craver, M.D.   Dg Chest Port 1 View  02/08/2011  *RADIOLOGY REPORT*  Clinical Data: 56 year old male with left-sided weakness.  PORTABLE CHEST - 1 VIEW  Comparison: None.  Findings: Portable semi upright AP view at 2125 hours.  Slightly shallow lung volumes.  Cardiac size and mediastinal contours are within normal limits.  No pneumothorax, pulmonary edema, pleural effusion or consolidation.   Mild retrocardiac opacity most resembles atelectasis. No acute osseous abnormality identified.  IMPRESSION: Mild retrocardiac atelectasis, no other acute cardiopulmonary abnormality.  Original Report Authenticated By: Harley Hallmark, M.D.   Medications: Scheduled Meds:   . docusate sodium  100 mg Oral BID  . FLUoxetine  40 mg Oral Daily  . lithium carbonate  300 mg Oral TID WC  . nicotine  14 mg Transdermal Daily  . risperiDONE  1 mg Oral BID  . risperiDONE  2 mg Oral QHS  . senna  1 tablet Oral BID  . sodium chloride  3 mL Intravenous Q12H  . traZODone  50 mg Oral QHS  . DISCONTD: haloperidol  2 mg Oral BID   Continuous Infusions:  PRN Meds:.acetaminophen, acetaminophen, albuterol, haloperidol lactate, ipratropium, naphazoline, ondansetron (ZOFRAN) IV, ondansetron  Assessment/Plan: aranoid schizophrenia (02/09/2011) Patient has a history of Paranoid Schizophrenia on reviewing his records in E. Chart. He was on several medications when he was here in October. Consulted Psychiatry for further management with his schizophrenia. Recommended pt to be on haldol. But pt is already on risperidone at group home. Will resume risperidone.  HTN (hypertension) (02/09/2011) BETTER controlled  Metoprolol stopped for bradycardia  Arm and leg pain (02/09/2011): resolved. Unsure of the etiology of his arm and leg pain. He does have mild swelling in the left lower extremity dopplers negative for DVT  COPD (chronic obstructive pulmonary disease) (02/09/2011) Lungs sound clear. Will place him on schedule albuterol Atrovent nebulizer treatments.  Will hold off on starting steroids as he is not wheezing.  Tobacco use (02/09/2011)  On  nicotine patch.  Mental retardation (02/09/2011) stable  Chest pain on breathing (02/09/2011) RESOLVED.  Seizure-like activity (02/09/2011) Question of seizure-like activity. EEG done results pending.      LOS: 4 days   Morrissa Shein 02/12/2011, 9:47 PM

## 2011-02-12 NOTE — Progress Notes (Signed)
CSW met with Pt.  Pt had trouble answering CSW's questions but was able to state that he lives with a man in an apt here in [place].  He stated that he has an aunt who lives in [place] but Pt was unable to provide CSW with her information.  For every other question, Pt answered, "I don't know."  Reviewed chart.  Noted that Pt is from [facility], [phone].  Spoke with [employer] representative, [employer].  Learned that Pt is dx'd with schizophrenia, bi polar and mild MR.  Pt is Rx'd Risperdone 1mg  bid and Risperdone 2mg  qhs, Trazodone 100mg  qhs and Lithium 300mg  tid.    Mr. Eliseo Gum states that Pt eloped on Friday by crawling out his window.  Mr. Eliseo Gum states that he suspected that Pt would come to the hospital, as Pt wants to drink coffee and smoke cigarettes and the hospital is conducive to the former.  Pt receives 2 cigarettes and 2 cups of coffee daily, per his tx plan at the group home, but Pt wants more.  He becomes agitated because he can't have more.  Mr. Eliseo Gum states that Pt has been at the group home since Jan 27th.  Prior to this, Pt was in [facility], [facility], [facility] and [facility].    Pt ok to return to [facility], when medically stable.    CSW, Tresa Endo, working on Pt returning to group home upon d/c.  Providence Crosby, LCSWA Clinical Social Work [phone]

## 2011-02-12 NOTE — Progress Notes (Signed)
EEG completed.

## 2011-02-12 NOTE — Procedures (Signed)
EEG NUMBER:  [id number].  This routine EEG was requested in this 56 year old man with a history of schizophrenia, admitted with altered mental status, confusion, agitation, right facial droop, and left-sided weakness.  There is a question of seizure activity.  Medications include haloperidol.  EEG was done with the patient awake.  During periods of maximal wakefulness, he had a 9-10 cycle per second posterior dominant rhythm that attenuated with eye opening with symmetric.  Background activity was composed of low-amplitude beta activities that were frontally dominant and symmetric.  Photic stimulation and hyperventilation were not performed.  The patient did not sleep.  CLINICAL INTERPRETATION:  This routine EEG done with the patient awake is normal.          ______________________________ Denton Meek, MD    ZO:[id number] D:  02/12/2011 21:51:58  T:  02/12/2011 22:02:01  Job #:  [id number]

## 2011-02-13 DIAGNOSIS — F209 Schizophrenia, unspecified: Secondary | ICD-10-CM

## 2011-02-13 MED ORDER — TRAZODONE HCL 50 MG PO TABS: 50.0000 mg | ORAL_TABLET | Freq: Every day | ORAL | Status: DC

## 2011-02-13 MED ORDER — LITHIUM CARBONATE 300 MG PO CAPS: 300.0000 mg | ORAL_CAPSULE | Freq: Three times a day (TID) | ORAL | Status: DC

## 2011-02-13 MED ORDER — NAPHAZOLINE HCL 0.1 % OP SOLN: 1.0000 [drp] | Freq: Four times a day (QID) | OPHTHALMIC | Status: DC | PRN

## 2011-02-13 MED ORDER — RISPERIDONE 2 MG PO TABS: 2.0000 mg | ORAL_TABLET | Freq: Every day | ORAL | Status: DC

## 2011-02-13 MED ORDER — RISPERIDONE 1 MG PO TABS: 1.0000 mg | ORAL_TABLET | Freq: Two times a day (BID) | ORAL | Status: DC

## 2011-02-13 MED ORDER — NICOTINE 14 MG/24HR TD PT24: 1.0000 | MEDICATED_PATCH | Freq: Every day | TRANSDERMAL | Status: DC

## 2011-02-13 NOTE — Consult Note (Signed)
Reason for Consult:Schizophrenia, R/O MMR Referring Physician:Dr. Rafeal Skibicki is an 56 y.o. male.  [name] of Present Illness: Patient is a 56 year old Caucasian male who was admitted on the medical floor with a chief complaint of leg and arm pain. Patient is a poor historian. He did not provide much detail. However he does admit history of psychiatric illness. Patient told he was admitted because he believed he has a seizure or stroke. As mentioned above patient was difficult to provide much detail. Patient told he has long history of psychiatric illness. He is taking lithium psychiatrist through [facility]. He does not remember when was the last time he saw psychiatrist. Patient was noticed talking to himself. He was easily distracted and difficult to engage in conversation. He admitted sometime hearing voices and seeing things. As per chart patient [facility]. He was diagnosed with paranoid schizophrenia. At that time he was taking Zyprexa. Patient told he lives with his friend in [place]. He was found barefoot last night in the rain.  Past Psychiatric History: As mentioned above patient has history of psychiatric illness with at least one psychiatric admission he do not recall any history of suicidal attempt however admitted hearing voices paranoid thinking and agitation. Patient told he has been living in the group home until recently the group home let him go as he was doing good. He decided to live with his friend in [place]. Patient to call his family member her deceased. As per chart she has history of admitting at [facility] in [place] [facility] [place] [facility] in 2000.  AXIS  I  Schizophrenia, r/o MMR AXIS II  Deferred  AXIS III Speech Impediment Past Medical History  Diagnosis Date  . Hypertension   . Myocardial infarct, old   . Seizures     History reviewed. No pertinent past surgical  history. AXIS IV  Housing, self care AXIS V  GAF  44   History reviewed. No pertinent family history.  Social History:  reports that he has been smoking Cigarettes.  He has been smoking about 2 packs per day. He does not have any smokeless tobacco history on file. He reports that he does not drink alcohol or use illicit drugs.  Allergies: No Known Allergies  Medications:I have reviewed patient's current medications  Results for orders placed during the hospital encounter of 02/08/11 (from the past 48 hour(s))  GLUCOSE, CAPILLARY     Status: Normal   Collection Time   02/11/11  8:58 AM      Component Value Range Comment   Glucose-Capillary 84  70 - 99 (mg/dL)   LITHIUM LEVEL     Status: Abnormal   Collection Time   02/11/11  8:25 PM      Component Value Range Comment   Lithium Lvl 0.56 (*) 0.80 - 1.40 (mEq/L)     No results found.  Review of Systems  Unable to perform ROS: other   Blood pressure 130/82, pulse 64, temperature 98.3 F (36.8 C), temperature source Oral, resp. rate 20, height 6\' 1"  (1.854 m), weight 72 kg (158 lb 11.7 oz), SpO2 96.00%. Physical Exam  Assessment/Plan:  Chart is reviewed, Pt is interviewed ~ 4: 45 pm 02/12/11 Pt is sitting in bed and is served dinner.  He has good eye contact. Eliseo Squires suggest thrpoid problems? ]  He has very simplistic repsonses with phonological disorder.  He also repeats every answer 2-3 times rapidly  He is alert aware and also indicates consistent with notes   He wants to return to a group home.  He says he hears voices sometimes; not now.  He denies Si HI.  RECOMMENDATION: 1. Pt is cognitively intact.  Transfer to group home when medically stable.  2. Continue current psychiatric meds.  3. No further psychiatric needs identified.  MD Psychiatrist signs off  [name] 02/13/2011, 2:38 AM

## 2011-02-13 NOTE — Progress Notes (Addendum)
Physical Therapy Treatment Patient Details Name: [name] MRN: [id number] DOB: [birth year] Today's Date: 02/13/2011  PT Assessment/Plan  PT - Assessment/Plan Comments on Treatment Session: Pt has progressed well with mobility. No further c/o pain. Pt ambulating in halls without assist. Possible D/C to group home. Would recommend supervision initally with all mobiity. PT will sign off at this time.  PT Plan: Discharge plan needs to be updated;All goals met and education completed, patient dischaged from PT services Recommendations for Other Services: OT consult Follow Up Recommendations: Supervision for mobility/OOB Equipment Recommended: None recommended by PT PT Goals  Acute Rehab PT Goals PT Goal: Sit to Stand - Progress: Met PT Goal: Stand to Sit - Progress: Met PT Goal: Ambulate - Progress: Partly met (pt noted to ambulate without assist on yesterday. Was supervision level today.)  PT Treatment Precautions/Restrictions  Precautions Precautions: Fall Mobility (including Balance) Bed Mobility Bed Mobility: Yes Supine to Sit: 7: Independent Transfers Transfers: Yes Sit to Stand: 7: Independent Stand to Sit: 7: Independent Ambulation/Gait Ambulation/Gait: Yes Ambulation/Gait Assistance: 5: Supervision Ambulation/Gait Assistance Details (indicate cue type and reason): VCs safety. Some intermittent wavering/scissoring during ambulation but no LOB. Observed pt ambulating multiple times in hallway on yesterday without assist.  Ambulation Distance (Feet): 225 Feet Assistive device: None Gait Pattern: Step-through pattern;Scissoring  Static Standing Balance Static Standing - Balance Support: No upper extremity supported Static Standing - Level of Assistance: 7: Independent Static Standing - Comment/# of Minutes: Had pt stand with EO/EC, narrow BOS-all without difficulty. Also performed external perturbations-some difficulty with push towards left side-but no LOB Dynamic Standing  Balance Dynamic Standing - Comments: Had pt peform 360 degree turn and p/u object without difficulty.  Exercise    End of Session PT - End of Session Equipment Utilized During Treatment: Gait belt Activity Tolerance: Patient tolerated treatment well Patient left: in bed General Behavior During Session: [facility] for tasks performed Cognition: Impaired, at baseline  [name] Alert [facility] 02/13/2011, 11:20 AM [phone]

## 2011-02-13 NOTE — Discharge Summary (Signed)
DISCHARGE SUMMARY  [name]  MR#: [id number]  DOB:Aug 30, 1955  Date of Admission: 02/08/2011 Date of Discharge: 02/13/2011  Attending Physician:Sho Salguero  Patient's PCP:No primary provider on file.  Consults: Psychiatry consult.   Discharge Diagnoses: Present on Admission:  .Arm and leg pain .Seizure-like activity .Paranoid schizophrenia .HTN (hypertension) .COPD (chronic obstructive pulmonary disease) .Tobacco use .Mental retardation .Chest pain on breathing    Medication List  As of 02/13/2011  9:34 AM   TAKE these medications         lithium carbonate 300 MG capsule   Take 1 capsule (300 mg total) by mouth 3 (three) times daily with meals.      naphazoline 0.1 % ophthalmic solution   Commonly known as: NAPHCON   Place 1 drop into both eyes 4 (four) times daily as needed (dry eyes).      nicotine 14 mg/24hr patch   Commonly known as: NICODERM CQ - dosed in mg/24 hours   Place 1 patch onto the skin daily.      risperiDONE 1 MG tablet   Commonly known as: RISPERDAL   Take 1 tablet (1 mg total) by mouth 2 (two) times daily.      risperiDONE 2 MG tablet   Commonly known as: RISPERDAL   Take 1 tablet (2 mg total) by mouth at bedtime.      traZODone 50 MG tablet   Commonly known as: DESYREL   Take 1 tablet (50 mg total) by mouth at bedtime.          Brief Hospital course: 56 year old gentleman with ha h/o schizophrenia, some degree of mental retardation was brought in to the hospital after going into the restaurant barefoot. He was unable to give any history and he complained of some foot and arm pain. He had a brief seizure like episode. Hence he was admitted to hospitalist service for evaluation of seizures and schizophrenia . Psychiatry consulted for further recommendations.       Hospital Course:  Paranoid schizophrenia (02/09/2011) Stable and at baseline.  Consulted Psychiatry for further management with his schizophrenia.  No further  recommendations  HTN (hypertension) (02/09/2011) BETTER controlled. She was initially started on  Metoprolol  But was held  for bradycardia  Arm and leg pain (02/09/2011): resolved. Unsure of the etiology of his arm and leg pain. He does have mild swelling in the left lower extremity dopplers negative for DVT . The swelling and the pain resolved spontaneously.  COPD (chronic obstructive pulmonary disease) (02/09/2011) Lungs sound clear.he did not require any use of the steroids or the nebulization's.  Tobacco use (02/09/2011)  On nicotine patch.  Mental retardation (02/09/2011) stable  Chest pain on breathing (02/09/2011) RESOLVED.  Seizure-like activity (02/09/2011) Question of seizure-like activity. EEG was within normal limits. The activity was probably a manifestation of his schizophrenia.      Day of Discharge BP 118/71  Pulse 81  Temp(Src) 97.5 F (36.4 C) (Oral)  Resp 18  Ht 6\' 1"  (1.854 m)  Wt 72 kg (158 lb 11.7 oz)  BMI 20.94 kg/m2  SpO2 93%  Physical Exam: On exam he is alert afebrile  cvs s1 s2 heard  Lungs clear  abd soft nontender bowel sounds heard  Extremities: no edema.   No results found for this or any previous visit (from the past 24 hour(s)).  Disposition: group home   Follow-up Appts: Discharge Orders    Future Orders Please Complete By Expires   Diet general  Discharge instructions      Comments:   Follow up with your Psychiatrist as needed.   Activity as tolerated - No restrictions              Time spent in discharge (includes decision making & examination of pt): 40 minutes  Signed: Wana Mount 02/13/2011, 9:34 AM

## 2011-02-14 NOTE — Progress Notes (Signed)
Patient set to discharge home today. CSW spoke with Nonnie Done from [facility] who stated he would come and pick patient up at 6:00p today to transport patient home. CSW faxed list of medications to [facility] on [address], as requested by [employer]. RN made aware.   Unice Bailey, LCSWA [phone]

## 2011-02-15 ENCOUNTER — Encounter (HOSPITAL_COMMUNITY): Payer: Self-pay | Admitting: Emergency Medicine

## 2011-02-15 ENCOUNTER — Other Ambulatory Visit: Payer: Self-pay

## 2011-02-15 ENCOUNTER — Emergency Department (HOSPITAL_COMMUNITY)
Admission: EM | Admit: 2011-02-15 | Discharge: 2011-02-15 | Disposition: A | Discharge status: Home / Self Care | Payer: Medicare Other | Attending: Emergency Medicine | Admitting: Emergency Medicine

## 2011-02-15 ENCOUNTER — Emergency Department (HOSPITAL_COMMUNITY): Payer: Medicare Other

## 2011-02-15 DIAGNOSIS — I10 Essential (primary) hypertension: Secondary | ICD-10-CM | POA: Insufficient documentation

## 2011-02-15 DIAGNOSIS — F259 Schizoaffective disorder, unspecified: Secondary | ICD-10-CM | POA: Insufficient documentation

## 2011-02-15 DIAGNOSIS — J449 Chronic obstructive pulmonary disease, unspecified: Secondary | ICD-10-CM | POA: Insufficient documentation

## 2011-02-15 DIAGNOSIS — J4489 Other specified chronic obstructive pulmonary disease: Secondary | ICD-10-CM | POA: Insufficient documentation

## 2011-02-15 DIAGNOSIS — F411 Generalized anxiety disorder: Secondary | ICD-10-CM | POA: Insufficient documentation

## 2011-02-15 DIAGNOSIS — F70 Mild intellectual disabilities: Secondary | ICD-10-CM | POA: Insufficient documentation

## 2011-02-15 DIAGNOSIS — R079 Chest pain, unspecified: Secondary | ICD-10-CM | POA: Insufficient documentation

## 2011-02-15 DIAGNOSIS — R4182 Altered mental status, unspecified: Secondary | ICD-10-CM | POA: Insufficient documentation

## 2011-02-15 LAB — CBC
Hemoglobin: 11.5 g/dL — ABNORMAL LOW (ref 13.0–17.0)
MCH: 26.6 pg (ref 26.0–34.0)
MCHC: 32.4 g/dL (ref 30.0–36.0)
Platelets: 257 10*3/uL (ref 150–400)

## 2011-02-15 LAB — RAPID URINE DRUG SCREEN, HOSP PERFORMED: Opiates: NOT DETECTED

## 2011-02-15 LAB — URINALYSIS, ROUTINE W REFLEX MICROSCOPIC
Bilirubin Urine: NEGATIVE
Leukocytes, UA: NEGATIVE
Nitrite: NEGATIVE
Specific Gravity, Urine: 1.007 (ref 1.005–1.030)
pH: 7 (ref 5.0–8.0)

## 2011-02-15 LAB — LITHIUM LEVEL: Lithium Lvl: 0.48 mEq/L — ABNORMAL LOW (ref 0.80–1.40)

## 2011-02-15 LAB — DIFFERENTIAL
Basophils Relative: 0 % (ref 0–1)
Eosinophils Absolute: 0.3 10*3/uL (ref 0.0–0.7)
Monocytes Relative: 6 % (ref 3–12)
Neutrophils Relative %: 88 % — ABNORMAL HIGH (ref 43–77)

## 2011-02-15 LAB — COMPREHENSIVE METABOLIC PANEL
Albumin: 3.1 g/dL — ABNORMAL LOW (ref 3.5–5.2)
Alkaline Phosphatase: 57 U/L (ref 39–117)
BUN: 11 mg/dL (ref 6–23)
Potassium: 4.3 mEq/L (ref 3.5–5.1)
Total Protein: 6.3 g/dL (ref 6.0–8.3)

## 2011-02-15 LAB — ETHANOL: Alcohol, Ethyl (B): <11 mg/dL (ref 0–11)

## 2011-02-15 LAB — TROPONIN I: Troponin I: <0.3 ng/mL (ref <0.30)

## 2011-02-15 LAB — POCT I-STAT TROPONIN I

## 2011-02-15 NOTE — ED Provider Notes (Signed)
History     CSN: [id number]  Arrival date & time 02/15/11  0049   First MD Initiated Contact with Patient 02/15/11 0108      Chief Complaint  Patient presents with  . Altered Mental Status  . Chest Pain    (Consider location/radiation/quality/duration/timing/severity/associated sxs/prior treatment) HPI Comments: Patient was apparently brought here by EMS after a clerk at a [employer] store was concerned about the patient complaining of chest pain in that he appeared confused.  The patient states that while he was in the store he started feeling chest pain on the left side of his chest and that he felt like he couldn't take it so he sat down.  He noted that it then went away.  It also returned and EMS did give him aspirin.  He states he has no pain at this time.  He has no difficulty with his breathing, no nausea, no diaphoresis.  Does not specifically appear to be exertionally related.  Patient denies any pain at this time and has no other complaints.  He knows where he is at this time and where he lives.  Patient is a 56 y.o. male presenting with chest pain. The history is provided by the patient and the EMS personnel. The history is limited by a developmental delay (Patient has mild mental retardation but does know some of his history and the events that occurred this evening.). No language interpreter was used.  Chest Pain The chest pain began less than 1 hour ago. Chest pain occurs intermittently. The chest pain is resolved. The severity of the pain is moderate. The quality of the pain is described as aching. The pain does not radiate. Pertinent negatives for primary symptoms include no fever, no fatigue, no syncope, no shortness of breath, no cough, no wheezing, no palpitations, no abdominal pain, no nausea, no vomiting and no dizziness.     Past Medical History  Diagnosis Date  . Psychiatric disorder   . Schizoaffective disorder   . Organic mood disorder   . Intermittent explosive  disorder   . Generalized anxiety disorder   . Mild mental retardation   . COPD (chronic obstructive pulmonary disease)   . Hypertension   . Peptic ulcer   . Thyroid disease   . Prostate hyperplasia with urinary obstruction   . Blind left eye     History reviewed. No pertinent past surgical history.  Family History  Problem Relation Age of Onset  . Hypertension      History  Substance Use Topics  . Smoking status: Current Everyday Smoker -- 0.2 packs/day    Types: Cigarettes  . Smokeless tobacco: Never Used  . Alcohol Use: No      Review of Systems  Constitutional: Negative.  Negative for fever, chills and fatigue.  HENT: Negative.   Eyes: Negative.  Negative for discharge and redness.  Respiratory: Negative.  Negative for cough, shortness of breath and wheezing.   Cardiovascular: Positive for chest pain. Negative for palpitations and syncope.  Gastrointestinal: Negative.  Negative for nausea, vomiting and abdominal pain.  Genitourinary: Negative.  Negative for hematuria.  Musculoskeletal: Negative.  Negative for back pain.  Skin: Negative.  Negative for color change and rash.  Neurological: Negative for dizziness, syncope and headaches.  Hematological: Negative.  Negative for adenopathy.  Psychiatric/Behavioral: Negative.  Negative for confusion.  All other systems reviewed and are negative.    Allergies  Chlorpromazine; Fluphenazine; Lactose intolerance (gi); Peanut-containing drug products; Prolixin; and Thorazine  Home  Medications   Current Outpatient Rx  Name Route Sig Dispense Refill  . IPRATROPIUM-ALBUTEROL 18-103 MCG/ACT IN AERO Inhalation Inhale 2 puffs into the lungs every 6 (six) hours as needed. Shortness of breath.     . AMLODIPINE BESYLATE 10 MG PO TABS Oral Take 10 mg by mouth daily.      Marland Kitchen DIPHENHYDRAMINE HCL 50 MG PO CAPS Oral Take 50 mg by mouth every 6 (six) hours as needed. For insomnia    . DOCUSATE SODIUM 100 MG PO CAPS Oral Take 100 mg by  mouth 2 (two) times daily.    Marland Kitchen FLUTICASONE PROPIONATE  HFA 44 MCG/ACT IN AERO Inhalation Inhale 2 puffs into the lungs 2 (two) times daily. 1 Inhaler 1  . HALOPERIDOL 2 MG PO TABS Oral Take 2 mg by mouth every 6 (six) hours as needed. agitation    . HYDROCODONE-ACETAMINOPHEN 5-325 MG PO TABS Oral Take 1 tablet by mouth every 6 (six) hours as needed. pain    . LEVETIRACETAM 1000 MG PO TABS Oral Take 1 tablet (1,000 mg total) by mouth every 12 (twelve) hours. 60 tablet 1  . LEVOTHYROXINE SODIUM 75 MCG PO TABS Oral Take 75 mcg by mouth daily.      Marland Kitchen LISINOPRIL 5 MG PO TABS Oral Take 5 mg by mouth daily.      Marland Kitchen LITHIUM CARBONATE 300 MG PO TABS Oral Take 300 mg by mouth 3 (three) times daily.    Marland Kitchen OMEPRAZOLE 20 MG PO CPDR Oral Take 20 mg by mouth 2 (two) times daily.      Marland Kitchen POTASSIUM CHLORIDE 20 MEQ PO PACK Oral Take 20 mEq by mouth daily.      Marland Kitchen PREDNISONE 20 MG PO TABS  Take 3 tabs by mouth daily X 2 days; then 2 tablets by mouth daily X 2 days; then 1 tablet by mouth daily X 2 days; then 1/2 tablet by mouth daily X 3 days and stop prednisone. 18 tablet 0  . RISPERIDONE 1 MG PO TABS Oral Take 1 tablet (1 mg total) by mouth daily.    Marland Kitchen RISPERIDONE 2 MG PO TABS Oral Take 2 mg by mouth at bedtime.    . TAMSULOSIN HCL 0.4 MG PO CAPS Oral Take 1 capsule (0.4 mg total) by mouth daily after supper. 30 capsule 1  . TRAZODONE HCL 100 MG PO TABS Oral Take 100 mg by mouth at bedtime.        BP 114/67  Pulse 84  Temp(Src) 98.8 F (37.1 C) (Oral)  Resp 25  SpO2 99%  Physical Exam  Nursing note and vitals reviewed. Constitutional: He is oriented to person, place, and time. He appears well-developed and well-nourished.  Non-toxic appearance. He does not have a sickly appearance.  HENT:  Head: Normocephalic and atraumatic.  Eyes: Conjunctivae, EOM and lids are normal. Pupils are equal, round, and reactive to light.  Neck: Trachea normal, normal range of motion and full passive range of motion without  pain. Neck supple.  Cardiovascular: Normal rate, regular rhythm and normal heart sounds.  Exam reveals no gallop and no friction rub.   No murmur heard. Pulmonary/Chest: Effort normal and breath sounds normal. No respiratory distress. He has no wheezes. He has no rales.  Abdominal: Soft. Normal appearance. He exhibits no distension. There is no tenderness. There is no rebound and no CVA tenderness.  Musculoskeletal: Normal range of motion.  Neurological: He is alert and oriented to person, place, and time. He has normal strength.  Skin: Skin is warm, dry and intact. No rash noted.  Psychiatric: His behavior is normal. Thought content normal.       Patient does speak in more simple language but in general his behavior and thought content is normal.    ED Course  Procedures (including critical care time)  Results for orders placed during the hospital encounter of 02/15/11  CBC      Component Value Range   WBC 13.4 (*) 4.0 - 10.5 (K/uL)   RBC 4.32  4.22 - 5.81 (MIL/uL)   Hemoglobin 11.5 (*) 13.0 - 17.0 (g/dL)   HCT 78.2 (*) 95.6 - 52.0 (%)   MCV 82.2  78.0 - 100.0 (fL)   MCH 26.6  26.0 - 34.0 (pg)   MCHC 32.4  30.0 - 36.0 (g/dL)   RDW 21.3  08.6 - 57.8 (%)   Platelets 257  150 - 400 (K/uL)  DIFFERENTIAL      Component Value Range   Neutrophils Relative 88 (*) 43 - 77 (%)   Neutro Abs 11.7 (*) 1.7 - 7.7 (K/uL)   Lymphocytes Relative 4 (*) 12 - 46 (%)   Lymphs Abs 0.5 (*) 0.7 - 4.0 (K/uL)   Monocytes Relative 6  3 - 12 (%)   Monocytes Absolute 0.8  0.1 - 1.0 (K/uL)   Eosinophils Relative 2  0 - 5 (%)   Eosinophils Absolute 0.3  0.0 - 0.7 (K/uL)   Basophils Relative 0  0 - 1 (%)   Basophils Absolute 0.0  0.0 - 0.1 (K/uL)  COMPREHENSIVE METABOLIC PANEL      Component Value Range   Sodium 136  135 - 145 (mEq/L)   Potassium 4.3  3.5 - 5.1 (mEq/L)   Chloride 104  96 - 112 (mEq/L)   CO2 26  19 - 32 (mEq/L)   Glucose, Bld 111 (*) 70 - 99 (mg/dL)   BUN 11  6 - 23 (mg/dL)    Creatinine, Ser 4.69  0.50 - 1.35 (mg/dL)   Calcium 9.6  8.4 - 62.9 (mg/dL)   Total Protein 6.3  6.0 - 8.3 (g/dL)   Albumin 3.1 (*) 3.5 - 5.2 (g/dL)   AST 8  0 - 37 (U/L)   ALT 6  0 - 53 (U/L)   Alkaline Phosphatase 57  39 - 117 (U/L)   Total Bilirubin 0.3  0.3 - 1.2 (mg/dL)   GFR calc non Af Amer >90  >90 (mL/min)   GFR calc Af Amer >90  >90 (mL/min)  LITHIUM LEVEL      Component Value Range   Lithium Lvl 0.48 (*) 0.80 - 1.40 (mEq/L)  URINALYSIS, ROUTINE W REFLEX MICROSCOPIC      Component Value Range   Color, Urine YELLOW  YELLOW    APPearance HAZY (*) CLEAR    Specific Gravity, Urine 1.007  1.005 - 1.030    pH 7.0  5.0 - 8.0    Glucose, UA NEGATIVE  NEGATIVE (mg/dL)   Hgb urine dipstick NEGATIVE  NEGATIVE    Bilirubin Urine NEGATIVE  NEGATIVE    Ketones, ur NEGATIVE  NEGATIVE (mg/dL)   Protein, ur NEGATIVE  NEGATIVE (mg/dL)   Urobilinogen, UA 0.2  0.0 - 1.0 (mg/dL)   Nitrite NEGATIVE  NEGATIVE    Leukocytes, UA NEGATIVE  NEGATIVE   TROPONIN I      Component Value Range   Troponin I <0.30  <0.30 (ng/mL)  ETHANOL      Component Value Range   Alcohol, Ethyl (B) <  11  0 - 11 (mg/dL)  URINE RAPID DRUG SCREEN (HOSP PERFORMED)      Component Value Range   Opiates NONE DETECTED  NONE DETECTED    Cocaine NONE DETECTED  NONE DETECTED    Benzodiazepines NONE DETECTED  NONE DETECTED    Amphetamines NONE DETECTED  NONE DETECTED    Tetrahydrocannabinol NONE DETECTED  NONE DETECTED    Barbiturates NONE DETECTED  NONE DETECTED    Dg Chest 2 View  02/15/2011  *RADIOLOGY REPORT*  Clinical Data: Chest pain and shortness of breath.  CHEST - 2 VIEW  Comparison: 01/31/2011  Findings: Shallow inspiration.  Normal heart size and pulmonary vascularity.  Patchy interstitial changes in the lungs similar to previous study.  No developing airspace consolidation.  No blunting of costophrenic angles.  No pneumothorax.  Diffuse emphysematous changes.  Mild prominence of central pulmonary vascularity may  represent pulmonary arterial hypertension.  Old right rib fracture. Degenerative changes in the thoracic spine and shoulders.  IMPRESSION: Stable patchy nodular areas of fibrosis in the lungs. Emphysematous changes.  Prominent central pulmonary vascularity suggesting pulmonary hypertension.  No active consolidation.  Original Report Authenticated By: Marlon Pel, M.D.        Date: 02/15/2011  Rate: 84  Rhythm: normal sinus rhythm  QRS Axis: normal  Intervals: normal  ST/T Wave abnormalities: normal  Conduction Disutrbances:none  Narrative Interpretation:   Old EKG Reviewed: unchanged from 03/06/2002   MDM  Patient appears to be at his baseline mental status as we continued to observe this patient over the last 3 hours.  He is alert and oriented.  He denies any further chest pain at all during his emergency department stay.  And as he does not appear to be altered I do not feel warrants further evaluation for this process.  He shows no acute laboratory abnormalities today nor signs of infection.  Patient had brief chest pain that is not well delineated but also has a normal EKG and will have 2 sets of cardiac markers checked and if they are both normal I feel this patient will able to be safely discharged home.        Nat Christen, MD 02/15/11 [id number]

## 2011-02-15 NOTE — ED Notes (Signed)
EMS was called to [employer] [facility] by the clerk on duty.  The clerk stated that the patient was sitting on the ground in the store and that the patient was confused as to where he was and where his home was located.  Upon arrival by EMS, the patient stated that he had 6/10 chest pain on the left side of his chest radiating down his left arm.  Seconds later, he told EMS it was gone (prior to any treatment).  Minutes later, he advised that his pain was back.  EMS applied the nasal cannula, and the patient stated that he pain was gone again prior to EMS connecting the cannula tubing to the O2 tank.  Patient is pain free at this time.  EMS EKG showed sinus tachycardia.

## 2011-02-15 NOTE — Discharge Instructions (Signed)
Chest Pain (Nonspecific) It is often hard to give a specific diagnosis for the cause of chest pain. There is always a chance that your pain could be related to something serious, such as a heart attack or a blood clot in the lungs. You need to follow up with your caregiver for further evaluation. CAUSES   Heartburn.   Pneumonia or bronchitis.   Anxiety and stress.   Inflammation around your heart (pericarditis) or lung (pleuritis or pleurisy).   A blood clot in the lung.   A collapsed lung (pneumothorax). It can develop suddenly on its own (spontaneous pneumothorax) or from injury (trauma) to the chest.  The chest wall is composed of bones, muscles, and cartilage. Any of these can be the source of the pain.  The bones can be bruised by injury.   The muscles or cartilage can be strained by coughing or overwork.   The cartilage can be affected by inflammation and become sore (costochondritis).  DIAGNOSIS  Lab tests or other studies, such as X-rays, an EKG, stress testing, or cardiac imaging, may be needed to find the cause of your pain.  TREATMENT   Treatment depends on what may be causing your chest pain. Treatment may include:   Acid blockers for heartburn.   Anti-inflammatory medicine.   Pain medicine for inflammatory conditions.   Antibiotics if an infection is present.   You may be advised to change lifestyle habits. This includes stopping smoking and avoiding caffeine and chocolate.   You may be advised to keep your head raised (elevated) when sleeping. This reduces the chance of acid going backward from your stomach into your esophagus.   Most of the time, nonspecific chest pain will improve within 2 to 3 days with rest and mild pain medicine.  HOME CARE INSTRUCTIONS   If antibiotics were prescribed, take the full amount even if you start to feel better.   For the next few days, avoid physical activities that bring on chest pain. Continue physical activities as  directed.   Do not smoke cigarettes or drink alcohol until your symptoms are gone.   Only take over-the-counter or prescription medicine for pain, discomfort, or fever as directed by your caregiver.   Follow your caregiver's suggestions for further testing if your chest pain does not go away.   Keep any follow-up appointments you made. If you do not go to an appointment, you could develop lasting (chronic) problems with pain. If there is any problem keeping an appointment, you must call to reschedule.  SEEK MEDICAL CARE IF:   You think you are having problems from the medicine you are taking. Read your medicine instructions carefully.   Your chest pain does not go away, even after treatment.   You develop a rash with blisters on your chest.  SEEK IMMEDIATE MEDICAL CARE IF:   You have increased chest pain or pain that spreads to your arm, neck, jaw, back, or belly (abdomen).   You develop shortness of breath, an increasing cough, or you are coughing up blood.   You have severe back or abdominal pain, feel sick to your stomach (nauseous) or throw up (vomit).   You develop severe weakness, fainting, or chills.   You have an oral temperature above 102 F (38.9 C), not controlled by medicine.  THIS IS AN EMERGENCY. Do not wait to see if the pain will go away. Get medical help at once. Call your local emergency services (911 in [place] Do not drive yourself to   the hospital. MAKE SURE YOU:   Understand these instructions.   Will watch your condition.   Will get help right away if you are not doing well or get worse.  Document Released: 09/27/2004 Document Revised: 08/30/2010 Document Reviewed: 07/24/2007 [employer] Patient Information 2012 [employer]. 

## 2011-02-15 NOTE — ED Notes (Signed)
Patient found sitting on ground in front of emergency room entrance. Redirected patient to room. Patient resting comfortably.

## 2011-02-15 NOTE — ED Notes (Signed)
Patient transported to X-ray 

## 2011-02-15 NOTE — ED Notes (Signed)
Patient's mentation is consistent with his baseline.  Spoke with his emergency contact.  He is on the way to pick up the patient.  The patient insists on sitting in the lobby while he is waiting.  The police officer on duty agreed to sit with the patient.

## 2011-02-15 NOTE — ED Notes (Signed)
Patient is now missing from the room.  Asked clin tech to look for the patient.

## 2011-02-16 ENCOUNTER — Encounter (HOSPITAL_COMMUNITY): Payer: Self-pay | Admitting: Emergency Medicine

## 2011-02-17 ENCOUNTER — Emergency Department (HOSPITAL_COMMUNITY): Payer: Medicare Other

## 2011-02-17 ENCOUNTER — Encounter (HOSPITAL_COMMUNITY): Payer: Self-pay

## 2011-02-17 ENCOUNTER — Emergency Department (HOSPITAL_COMMUNITY)
Admission: EM | Admit: 2011-02-17 | Discharge: 2011-02-18 | Disposition: A | Discharge status: Home / Self Care | Payer: Medicare Other | Attending: Emergency Medicine | Admitting: Emergency Medicine

## 2011-02-17 DIAGNOSIS — N401 Enlarged prostate with lower urinary tract symptoms: Secondary | ICD-10-CM | POA: Insufficient documentation

## 2011-02-17 DIAGNOSIS — Z7982 Long term (current) use of aspirin: Secondary | ICD-10-CM | POA: Insufficient documentation

## 2011-02-17 DIAGNOSIS — I10 Essential (primary) hypertension: Secondary | ICD-10-CM | POA: Insufficient documentation

## 2011-02-17 DIAGNOSIS — E079 Disorder of thyroid, unspecified: Secondary | ICD-10-CM | POA: Insufficient documentation

## 2011-02-17 DIAGNOSIS — H5440 Blindness, one eye, unspecified eye: Secondary | ICD-10-CM | POA: Insufficient documentation

## 2011-02-17 DIAGNOSIS — R0602 Shortness of breath: Secondary | ICD-10-CM | POA: Insufficient documentation

## 2011-02-17 DIAGNOSIS — F6381 Intermittent explosive disorder: Secondary | ICD-10-CM | POA: Insufficient documentation

## 2011-02-17 DIAGNOSIS — Z860100 Personal history of colon polyps, unspecified: Secondary | ICD-10-CM | POA: Insufficient documentation

## 2011-02-17 DIAGNOSIS — F411 Generalized anxiety disorder: Secondary | ICD-10-CM | POA: Insufficient documentation

## 2011-02-17 DIAGNOSIS — J449 Chronic obstructive pulmonary disease, unspecified: Secondary | ICD-10-CM | POA: Insufficient documentation

## 2011-02-17 DIAGNOSIS — Z79899 Other long term (current) drug therapy: Secondary | ICD-10-CM | POA: Insufficient documentation

## 2011-02-17 DIAGNOSIS — J4489 Other specified chronic obstructive pulmonary disease: Secondary | ICD-10-CM | POA: Insufficient documentation

## 2011-02-17 DIAGNOSIS — F39 Unspecified mood [affective] disorder: Secondary | ICD-10-CM | POA: Insufficient documentation

## 2011-02-17 DIAGNOSIS — I252 Old myocardial infarction: Secondary | ICD-10-CM | POA: Insufficient documentation

## 2011-02-17 DIAGNOSIS — F17200 Nicotine dependence, unspecified, uncomplicated: Secondary | ICD-10-CM | POA: Insufficient documentation

## 2011-02-17 DIAGNOSIS — N138 Other obstructive and reflux uropathy: Secondary | ICD-10-CM | POA: Insufficient documentation

## 2011-02-17 DIAGNOSIS — R209 Unspecified disturbances of skin sensation: Secondary | ICD-10-CM | POA: Insufficient documentation

## 2011-02-17 DIAGNOSIS — N139 Obstructive and reflux uropathy, unspecified: Secondary | ICD-10-CM | POA: Insufficient documentation

## 2011-02-17 DIAGNOSIS — F70 Mild intellectual disabilities: Secondary | ICD-10-CM | POA: Insufficient documentation

## 2011-02-17 LAB — URINALYSIS, ROUTINE W REFLEX MICROSCOPIC
Leukocytes, UA: NEGATIVE
Nitrite: NEGATIVE
Specific Gravity, Urine: 1.003 — ABNORMAL LOW (ref 1.005–1.030)
Urobilinogen, UA: 0.2 mg/dL (ref 0.0–1.0)
pH: 6.5 (ref 5.0–8.0)

## 2011-02-17 LAB — CBC
Hemoglobin: 10.8 g/dL — ABNORMAL LOW (ref 13.0–17.0)
MCHC: 32.2 g/dL (ref 30.0–36.0)
Platelets: 236 10*3/uL (ref 150–400)
RBC: 4.03 MIL/uL — ABNORMAL LOW (ref 4.22–5.81)

## 2011-02-17 LAB — COMPREHENSIVE METABOLIC PANEL
ALT: 8 U/L (ref 0–53)
AST: 10 U/L (ref 0–37)
Alkaline Phosphatase: 54 U/L (ref 39–117)
GFR calc Af Amer: >90 mL/min (ref >90)
Glucose, Bld: 93 mg/dL (ref 70–99)
Potassium: 3.5 mEq/L (ref 3.5–5.1)
Sodium: 139 mEq/L (ref 135–145)
Total Protein: 5.9 g/dL — ABNORMAL LOW (ref 6.0–8.3)

## 2011-02-17 LAB — DIFFERENTIAL
Basophils Relative: 0 % (ref 0–1)
Eosinophils Absolute: 0.5 10*3/uL (ref 0.0–0.7)
Eosinophils Relative: 6 % — ABNORMAL HIGH (ref 0–5)
Lymphocytes Relative: 15 % (ref 12–46)
Neutrophils Relative %: 68 % (ref 43–77)

## 2011-02-17 MED ORDER — SODIUM CHLORIDE 0.9 % IV SOLN
Freq: Once | INTRAVENOUS | Status: AC
  Administered 2011-02-17: 18:00:00 via INTRAVENOUS

## 2011-02-17 NOTE — ED Notes (Signed)
Pt eating [place] sand.

## 2011-02-17 NOTE — Discharge Instructions (Signed)
Follow up as needed

## 2011-02-17 NOTE — ED Provider Notes (Signed)
History     CSN: [id number]  Arrival date & time 02/17/11  1735   First MD Initiated Contact with Patient 02/17/11 1804      Chief Complaint  Patient presents with  . Shortness of Breath    (Consider location/radiation/quality/duration/timing/severity/associated sxs/prior treatment) Patient is a 56 y.o. male presenting with shortness of breath. The history is provided by the patient (Patient complains of some shortness of breath today with wheezing along with a little bit of weakness of left leg bleeding was improved by the nebulized treatment given to him by the paramedics. The weakness in his left leg completely resolved the). No language interpreter was used.  Shortness of Breath  The current episode started today. The problem occurs rarely. The problem has been resolved. The problem is mild. The symptoms are relieved by beta-agonist inhalers. Associated symptoms include shortness of breath. Pertinent negatives include no chest pain and no cough. He has had intermittent steroid use. His past medical history does not include eczema. Urine output has been normal. There were no sick contacts.    Past Medical History  Diagnosis Date  . Myocardial infarct, old   . Seizures   . Psychiatric disorder   . Schizoaffective disorder   . Organic mood disorder   . Intermittent explosive disorder   . Generalized anxiety disorder   . Mild mental retardation   . COPD (chronic obstructive pulmonary disease)   . Hypertension   . Peptic ulcer   . Thyroid disease   . Prostate hyperplasia with urinary obstruction   . Blind left eye     History reviewed. No pertinent past surgical history.  Family History  Problem Relation Age of Onset  . Hypertension      History  Substance Use Topics  . Smoking status: Current Everyday Smoker -- 0.2 packs/day    Types: Cigarettes  . Smokeless tobacco: Never Used  . Alcohol Use: No      Review of Systems  Constitutional: Negative for fatigue.    HENT: Negative for congestion, sinus pressure and ear discharge.   Eyes: Negative for discharge.  Respiratory: Positive for shortness of breath. Negative for cough.   Cardiovascular: Negative for chest pain.  Gastrointestinal: Negative for abdominal pain and diarrhea.  Genitourinary: Negative for frequency and hematuria.  Musculoskeletal: Negative for back pain.       Weakness left leg  Skin: Negative for rash.  Neurological: Negative for seizures and headaches.  Hematological: Negative.   Psychiatric/Behavioral: Negative for hallucinations.    Allergies  Peanut-containing drug products; Prolixin; Thorazine; and Lactose intolerance (gi)  Home Medications   Current Outpatient Rx  Name Route Sig Dispense Refill  . IPRATROPIUM-ALBUTEROL 18-103 MCG/ACT IN AERO Inhalation Inhale 2 puffs into the lungs every 6 (six) hours as needed. Shortness of breath.     . AMLODIPINE BESYLATE 10 MG PO TABS Oral Take 10 mg by mouth daily.      . ASPIRIN 81 MG PO CHEW Oral Chew 324 mg by mouth once.     Marland Kitchen DIPHENHYDRAMINE HCL 50 MG PO CAPS Oral Take 50 mg by mouth every 6 (six) hours as needed. For insomnia    . DOCUSATE SODIUM 100 MG PO CAPS Oral Take 100 mg by mouth 2 (two) times daily.    Marland Kitchen FLUTICASONE PROPIONATE  HFA 44 MCG/ACT IN AERO Inhalation Inhale 2 puffs into the lungs 2 (two) times daily.    Marland Kitchen HALOPERIDOL 2 MG PO TABS Oral Take 2 mg by mouth every 6 (  six) hours as needed. agitation    . HYDROCODONE-ACETAMINOPHEN 5-325 MG PO TABS Oral Take 1 tablet by mouth every 6 (six) hours as needed. pain    . LEVETIRACETAM 1000 MG PO TABS Oral Take 1,000 mg by mouth every 12 (twelve) hours.    Marland Kitchen LEVOTHYROXINE SODIUM 75 MCG PO TABS Oral Take 75 mcg by mouth daily.      Marland Kitchen LISINOPRIL 5 MG PO TABS Oral Take 5 mg by mouth daily.      Marland Kitchen LITHIUM CARBONATE 300 MG PO CAPS Oral Take 300 mg by mouth 3 (three) times daily with meals.    Marland Kitchen NAPHAZOLINE HCL 0.1 % OP SOLN Both Eyes Place 1 drop into both eyes 4 (four)  times daily as needed. For dry eyes    . OMEPRAZOLE 20 MG PO CPDR Oral Take 20 mg by mouth 2 (two) times daily.      Marland Kitchen POTASSIUM CHLORIDE 20 MEQ PO PACK Oral Take 20 mEq by mouth daily.      Marland Kitchen RISPERIDONE 1 MG PO TABS Oral Take 1 mg by mouth 2 (two) times daily.    Marland Kitchen RISPERIDONE 2 MG PO TABS Oral Take 2 mg by mouth at bedtime.    . TAMSULOSIN HCL 0.4 MG PO CAPS Oral Take 0.4 mg by mouth daily after supper.    . TRAZODONE HCL 100 MG PO TABS Oral Take 100 mg by mouth at bedtime.        BP 121/86  Temp(Src) 97.3 F (36.3 C) (Oral)  Resp 22  SpO2 98%  Physical Exam  Constitutional: He is oriented to person, place, and time. He appears well-developed.  HENT:  Head: Normocephalic and atraumatic.  Eyes: Conjunctivae and EOM are normal. No scleral icterus.  Neck: Neck supple. No thyromegaly present.  Cardiovascular: Normal rate and regular rhythm.  Exam reveals no gallop and no friction rub.   No murmur heard. Pulmonary/Chest: No stridor. He has wheezes. He has no rales. He exhibits no tenderness.       Very slight wheezes bilaterally  Abdominal: He exhibits no distension. There is no tenderness. There is no rebound.  Musculoskeletal: Normal range of motion. He exhibits no edema.  Lymphadenopathy:    He has no cervical adenopathy.  Neurological: He is oriented to person, place, and time. Coordination normal.  Skin: No rash noted. No erythema.  Psychiatric: He has a normal mood and affect. His behavior is normal.    ED Course  Procedures (including critical care time)  Labs Reviewed  URINALYSIS, ROUTINE W REFLEX MICROSCOPIC - Abnormal; Notable for the following:    Specific Gravity, Urine 1.003 (*)    All other components within normal limits  CBC - Abnormal; Notable for the following:    RBC 4.03 (*)    Hemoglobin 10.8 (*)    HCT 33.5 (*)    All other components within normal limits  DIFFERENTIAL - Abnormal; Notable for the following:    Eosinophils Relative 6 (*)    All other  components within normal limits  COMPREHENSIVE METABOLIC PANEL - Abnormal; Notable for the following:    Total Protein 5.9 (*)    Albumin 2.7 (*)    Total Bilirubin 0.1 (*)    All other components within normal limits   Dg Chest 1 View  02/17/2011  *RADIOLOGY REPORT*  Clinical Data: Shortness of breath.  CHEST - 1 VIEW  Comparison: 02/15/2011, 12/04/2010 and 10/06/2010 chestx-ray. 12/04/2010 CT.  Findings: Pulmonary vascular congestion.  No pneumothorax.  Lung parenchymal changes more notable on the left. This may represent sequelae of prior infection superimposed upon chronic changes.  This patient had adenopathy on the prior chest CT, and follow-up CT imaging at such time was recommended in 3 - 6 months. When this follow-up CT is obtained than the lung parenchymal changes can be readdressed to exclude malignancy.  When compared to the most recent chest x-ray examination there has been no significant change.  IMPRESSION: No significant change since recent chest x-ray.  Follow up as discussed above.  Original Report Authenticated By: Fuller Canada, M.D.   Ct Head Wo Contrast  02/17/2011  *RADIOLOGY REPORT*  Clinical Data: Fall and hit head.  Left-sided numbness.  CT HEAD WITHOUT CONTRAST  Technique:  Contiguous axial images were obtained from the base of the skull through the vertex without contrast.  Comparison: 12/03/2003  Findings: There is motion artifact at the cerebellum despite obtaining additional images.  No evidence for acute hemorrhage, mass lesion, midline shift, hydrocephalus or large infarct.  There is diffuse mucosal disease in the maxillary sinuses and ethmoid air cells.  Visualized mastoid air cells are clear.  IMPRESSION: No acute intracranial abnormality.  Limited evaluation at the skull base and cerebellum due to motion artifact.  Paranasal sinus disease.  Original Report Authenticated By: Richarda Overlie, M.D.     1. COPD (chronic obstructive pulmonary disease)       MDM   Copd,        Benny Lennert, MD 02/17/11 2217

## 2011-02-17 NOTE — ED Notes (Signed)
Pt to ed via [facility], pt c/o (L) leg numbness, ambulatory at scene, SOB, MAE w/o difficulty, no neuro deficits, auditory wheezing, 91% ra increased to 100% after breathing tx and 4 L O2 [place], 20 g (R) wrist
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
## 2011-02-17 NOTE — ED Notes (Signed)
Mr Radford Pax called to pick up patient states will be here in 30 minutes

## 2011-02-17 NOTE — ED Notes (Signed)
Pt presents to ED w/no complaints, when asked was anything hurting pt, pt took in a deep breath and states "yea, I can't breath," pt began flailing his arms and moving his upper torso back and forth, Clinical research associate asked pt if he was ok anf pt states "yea, I'm having a seizure." Writer asked pt and caretaker Alycia Rossetti if pt had a hx of seizures, Alycia Rossetti states "yes, every time he comes in here and he thinks he's going on."

## 2011-02-18 ENCOUNTER — Emergency Department (HOSPITAL_COMMUNITY)
Admission: EM | Admit: 2011-02-18 | Discharge: 2011-02-18 | Disposition: A | Discharge status: Skilled Nursing Facility | Payer: Medicare Other | Attending: Emergency Medicine | Admitting: Emergency Medicine

## 2011-02-18 ENCOUNTER — Other Ambulatory Visit: Payer: Self-pay

## 2011-02-18 ENCOUNTER — Emergency Department (HOSPITAL_COMMUNITY): Payer: Medicare Other

## 2011-02-18 DIAGNOSIS — R109 Unspecified abdominal pain: Secondary | ICD-10-CM | POA: Insufficient documentation

## 2011-02-18 DIAGNOSIS — I10 Essential (primary) hypertension: Secondary | ICD-10-CM | POA: Insufficient documentation

## 2011-02-18 DIAGNOSIS — F6381 Intermittent explosive disorder: Secondary | ICD-10-CM | POA: Insufficient documentation

## 2011-02-18 DIAGNOSIS — N401 Enlarged prostate with lower urinary tract symptoms: Secondary | ICD-10-CM | POA: Insufficient documentation

## 2011-02-18 DIAGNOSIS — F411 Generalized anxiety disorder: Secondary | ICD-10-CM | POA: Insufficient documentation

## 2011-02-18 DIAGNOSIS — R079 Chest pain, unspecified: Secondary | ICD-10-CM | POA: Insufficient documentation

## 2011-02-18 DIAGNOSIS — I252 Old myocardial infarction: Secondary | ICD-10-CM | POA: Insufficient documentation

## 2011-02-18 DIAGNOSIS — F39 Unspecified mood [affective] disorder: Secondary | ICD-10-CM | POA: Insufficient documentation

## 2011-02-18 DIAGNOSIS — F17200 Nicotine dependence, unspecified, uncomplicated: Secondary | ICD-10-CM | POA: Insufficient documentation

## 2011-02-18 DIAGNOSIS — R0602 Shortness of breath: Secondary | ICD-10-CM | POA: Insufficient documentation

## 2011-02-18 DIAGNOSIS — N138 Other obstructive and reflux uropathy: Secondary | ICD-10-CM | POA: Insufficient documentation

## 2011-02-18 DIAGNOSIS — E079 Disorder of thyroid, unspecified: Secondary | ICD-10-CM | POA: Insufficient documentation

## 2011-02-18 DIAGNOSIS — Z79899 Other long term (current) drug therapy: Secondary | ICD-10-CM | POA: Insufficient documentation

## 2011-02-18 DIAGNOSIS — R05 Cough: Secondary | ICD-10-CM | POA: Insufficient documentation

## 2011-02-18 DIAGNOSIS — R197 Diarrhea, unspecified: Secondary | ICD-10-CM | POA: Insufficient documentation

## 2011-02-18 DIAGNOSIS — H5440 Blindness, one eye, unspecified eye: Secondary | ICD-10-CM | POA: Insufficient documentation

## 2011-02-18 DIAGNOSIS — Z860100 Personal history of colon polyps, unspecified: Secondary | ICD-10-CM | POA: Insufficient documentation

## 2011-02-18 DIAGNOSIS — J189 Pneumonia, unspecified organism: Secondary | ICD-10-CM

## 2011-02-18 DIAGNOSIS — N139 Obstructive and reflux uropathy, unspecified: Secondary | ICD-10-CM | POA: Insufficient documentation

## 2011-02-18 DIAGNOSIS — R059 Cough, unspecified: Secondary | ICD-10-CM | POA: Insufficient documentation

## 2011-02-18 DIAGNOSIS — F79 Unspecified intellectual disabilities: Secondary | ICD-10-CM | POA: Insufficient documentation

## 2011-02-18 LAB — CBC
HCT: 34.5 % — ABNORMAL LOW (ref 39.0–52.0)
Hemoglobin: 11.1 g/dL — ABNORMAL LOW (ref 13.0–17.0)
Platelets: 228 10*3/uL (ref 150–400)

## 2011-02-18 LAB — COMPREHENSIVE METABOLIC PANEL
ALT: 8 U/L (ref 0–53)
AST: 9 U/L (ref 0–37)
Alkaline Phosphatase: 58 U/L (ref 39–117)
CO2: 27 mEq/L (ref 19–32)
Calcium: 9.4 mg/dL (ref 8.4–10.5)
Chloride: 111 mEq/L (ref 96–112)
GFR calc non Af Amer: >90 mL/min (ref >90)
Potassium: 3.6 mEq/L (ref 3.5–5.1)
Sodium: 145 mEq/L (ref 135–145)
Total Bilirubin: 0.1 mg/dL — ABNORMAL LOW (ref 0.3–1.2)

## 2011-02-18 LAB — URINALYSIS, ROUTINE W REFLEX MICROSCOPIC
Glucose, UA: NEGATIVE mg/dL
Ketones, ur: NEGATIVE mg/dL
Leukocytes, UA: NEGATIVE
pH: 6 (ref 5.0–8.0)

## 2011-02-18 LAB — D-DIMER, QUANTITATIVE: D-Dimer, Quant: 0.97 ug/mL-FEU — ABNORMAL HIGH (ref 0.00–0.48)

## 2011-02-18 LAB — POCT I-STAT TROPONIN I: Troponin i, poc: 0 ng/mL (ref 0.00–0.08)

## 2011-02-18 MED ORDER — IOHEXOL 350 MG/ML SOLN
80.0000 mL | Freq: Once | INTRAVENOUS | Status: AC | PRN
  Administered 2011-02-18: 80 mL via INTRAVENOUS

## 2011-02-18 MED ORDER — LOPERAMIDE HCL 2 MG PO CAPS
2.0000 mg | ORAL_CAPSULE | Freq: Once | ORAL | Status: AC
  Administered 2011-02-18: 2 mg via ORAL
  Filled 2011-02-18: qty 2

## 2011-02-18 MED ORDER — PREDNISONE 20 MG PO TABS
60.0000 mg | ORAL_TABLET | Freq: Once | ORAL | Status: AC
  Administered 2011-02-18: 60 mg via ORAL
  Filled 2011-02-18: qty 1
  Filled 2011-02-18: qty 3

## 2011-02-18 MED ORDER — GI COCKTAIL ~~LOC~~
30.0000 mL | Freq: Once | ORAL | Status: AC
  Administered 2011-02-18: 30 mL via ORAL
  Filled 2011-02-18: qty 30

## 2011-02-18 MED ORDER — LOPERAMIDE HCL 2 MG PO CAPS: 2.0000 mg | ORAL_CAPSULE | Freq: Four times a day (QID) | ORAL | Status: AC | PRN

## 2011-02-18 MED ORDER — IPRATROPIUM BROMIDE 0.02 % IN SOLN
0.5000 mg | Freq: Once | RESPIRATORY_TRACT | Status: AC
  Administered 2011-02-18: 0.5 mg via RESPIRATORY_TRACT
  Filled 2011-02-18: qty 2.5

## 2011-02-18 MED ORDER — MORPHINE SULFATE 2 MG/ML IJ SOLN
2.0000 mg | Freq: Once | INTRAMUSCULAR | Status: AC
  Administered 2011-02-18: 2 mg via INTRAVENOUS
  Filled 2011-02-18: qty 1

## 2011-02-18 MED ORDER — HYDROCODONE-ACETAMINOPHEN 5-325 MG PO TABS: 1.0000 | ORAL_TABLET | ORAL | Status: AC | PRN

## 2011-02-18 MED ORDER — ALBUTEROL SULFATE (5 MG/ML) 0.5% IN NEBU
5.0000 mg | INHALATION_SOLUTION | Freq: Once | RESPIRATORY_TRACT | Status: AC
  Administered 2011-02-18: 5 mg via RESPIRATORY_TRACT
  Filled 2011-02-18: qty 1

## 2011-02-18 MED ORDER — SODIUM CHLORIDE 0.9 % IV BOLUS (SEPSIS)
1000.0000 mL | Freq: Once | INTRAVENOUS | Status: AC
  Administered 2011-02-18: 1000 mL via INTRAVENOUS

## 2011-02-18 MED ORDER — AZITHROMYCIN 250 MG PO TABS: 250.0000 mg | ORAL_TABLET | Freq: Every day | ORAL | Status: AC

## 2011-02-18 NOTE — ED Notes (Signed)
4th episode od diarrhea, stool brown with no bloody stool or black stool, pt using bed side commode

## 2011-02-18 NOTE — ED Provider Notes (Signed)
Medical screening examination/treatment/procedure(s) were conducted as a shared visit with non-physician practitioner(s) and myself.  I personally evaluated the patient during the encounter    Forbes Cellar, MD 02/18/11 [phone]

## 2011-02-18 NOTE — ED Notes (Signed)
[name] with group home contacted re: diagnosis & follow up care, Mr. [name] verbalized understanding of follow up care

## 2011-02-18 NOTE — ED Notes (Signed)
Called ptar to transport pt back to group home

## 2011-02-18 NOTE — ED Notes (Signed)
Per EMS Nonnie Done is the contact for the pt, contact number [phone], pt is not to have coffee because he gets agitated

## 2011-02-18 NOTE — ED Notes (Signed)
Pt having second episode of diarrhea since arriving at the hospital

## 2011-02-18 NOTE — ED Notes (Signed)
Pt given applesauce & pretzels for snack, pt on cardiac monitor, NAD will continue to monitor

## 2011-02-18 NOTE — ED Notes (Signed)
Pt returned from radiology.

## 2011-02-18 NOTE — Discharge Instructions (Signed)
Your Cat Scan today is negative for a blood clot in your lungs.  It does however show a nodule on the right side that is most likely an infection.  We will treat you with an antibiotic for the infection but you need to have your scan repeated in 3 months to make sure the nodule goes away.  Take antibiotic as prescribed. Take vicodin as prescribed for severe pain.   Do not drive within four hours of taking this medication (may cause drowsiness or confusion).  Continue your prilosec (omeprazole) on a daily basis.  Take imodium as needed for diarrhea.  Drink plenty of fluids to prevent dehydration.  You should return to the ER if your pain or shortness of breath worsens.    Pneumonia, Adult Pneumonia is an infection of the lungs. It may be caused by a germ (virus or bacteria). Some types of pneumonia can spread easily from person to person. This can happen when you cough or sneeze. HOME CARE  Only take medicine as told by your doctor.   Take your medicine (antibiotics) as told. Finish it even if you start to feel better.   Do not smoke.   You may use a vaporizer or humidifier in your room. This can help loosen thick spit (mucus).   Sleep so you are almost sitting up (semi-upright). This helps reduce coughing.   Rest.  A shot (vaccine) can help prevent pneumonia. Shots are often advised for:  People over 89 years old.   Patients on chemotherapy.   People with long-term (chronic) lung problems.   People with immune system problems.  GET HELP RIGHT AWAY IF:   You are getting worse.   You cannot control your cough, and you are losing sleep.   You cough up blood.   Your pain gets worse, even with medicine.   You have a fever.   Any of your problems are getting worse, not better.   You have shortness of breath or chest pain.  MAKE SURE YOU:   Understand these instructions.   Will watch your condition.   Will get help right away if you are not doing well or get worse.  Document  Released: 06/06/2007 Document Revised: 08/30/2010 Document Reviewed: 03/10/2010 [facility] Patient Information 2012 [place], [place].Call [facility] [phone]) as soon as possible for assistance with finding one.  You may return to the ER if your symptoms worsen or you have any other concerns.

## 2011-02-18 NOTE — ED Notes (Signed)
Pt in per [facility], pt from Group on [address] of [address], pt c/o CP on the L side that radiates to L arm, pt c/o SOB, N/V, & diaphoresis, pt rcvd 324 ASA & 1 SL nitro in route, pt A&O x4, follows commands, speaks in complete sentences, calm & cooperative, pt hx of MR, pt was picked up at grocery store & is not able to verbalize which group home he resides at, NAD

## 2011-02-18 NOTE — ED Notes (Signed)
Caregiver came to pick up patient but did not want to come in, patient taken to front and discharged back to the group home caregiver not able to sign signature pad since did not want to come in

## 2011-02-18 NOTE — ED Provider Notes (Signed)
History     CSN: [id number]  Arrival date & time 02/18/11  1106   First MD Initiated Contact with Patient 02/18/11 1118      Chief Complaint  Patient presents with  . Chest Pain    (Consider location/radiation/quality/duration/timing/severity/associated sxs/prior treatment) HPI History provided by pt.   Pt comes from group home, has h/o mild MR as well as psychiatric illness and is a poor historian.  C/o non-traumatic LUE and pleuritic left chest pain since last night.  Intermittent and associated w/ SOB as well as productive cough.  Also c/o abdominal pain and diarrhea.  No N/V or diaphoresis.   H/o COPD and MI.  Had aspirin and ntg en route to hospital and current pain level 3/10.  Per prior chart, pt seen for same on 02/15/11.  Had a negative cardiopulmonary work-up at that time and d/c'd home.  Was admitted to the hospital the week before for psych consult and c/o pleuritic CP and arm pain at that time as well.  Admitted 1/30-02/05/11 for chest pain that was determined to be non-cardiac and likely related to COPD and/or GERD.  Pt has no h/o DVT/PE and denies LE pain/edema.    Past Medical History  Diagnosis Date  . Myocardial infarct, old   . Seizures   . Psychiatric disorder   . Schizoaffective disorder   . Organic mood disorder   . Intermittent explosive disorder   . Generalized anxiety disorder   . Mild mental retardation   . COPD (chronic obstructive pulmonary disease)   . Hypertension   . Peptic ulcer   . Thyroid disease   . Prostate hyperplasia with urinary obstruction   . Blind left eye     No past surgical history on file.  Family History  Problem Relation Age of Onset  . Hypertension      History  Substance Use Topics  . Smoking status: Current Everyday Smoker -- 0.2 packs/day    Types: Cigarettes  . Smokeless tobacco: Never Used  . Alcohol Use: No      Review of Systems  All other systems reviewed and are negative.    Allergies  Peanut-containing  drug products; Prolixin; Thorazine; and Lactose intolerance (gi)  Home Medications   Current Outpatient Rx  Name Route Sig Dispense Refill  . IPRATROPIUM-ALBUTEROL 18-103 MCG/ACT IN AERO Inhalation Inhale 2 puffs into the lungs every 6 (six) hours as needed. Shortness of breath.     . AMLODIPINE BESYLATE 10 MG PO TABS Oral Take 10 mg by mouth daily.      . ASPIRIN 81 MG PO CHEW Oral Chew 324 mg by mouth once.     . ASPIRIN EC 81 MG PO TBEC Oral Take 81 mg by mouth daily.    Marland Kitchen DIPHENHYDRAMINE HCL 50 MG PO CAPS Oral Take 50 mg by mouth every 6 (six) hours as needed. For insomnia    . DOCUSATE SODIUM 100 MG PO CAPS Oral Take 100 mg by mouth 2 (two) times daily.    Marland Kitchen FLUTICASONE PROPIONATE  HFA 44 MCG/ACT IN AERO Inhalation Inhale 2 puffs into the lungs 2 (two) times daily.    Marland Kitchen HALOPERIDOL 2 MG PO TABS Oral Take 2 mg by mouth every 6 (six) hours as needed. agitation    . LISINOPRIL 5 MG PO TABS Oral Take 5 mg by mouth daily.      Marland Kitchen LITHIUM CARBONATE 300 MG PO CAPS Oral Take 300 mg by mouth 3 (three) times daily with meals.    Marland Kitchen  POTASSIUM CHLORIDE 20 MEQ PO PACK Oral Take 20 mEq by mouth daily.      Marland Kitchen RISPERIDONE 1 MG PO TABS Oral Take 1 mg by mouth 2 (two) times daily.    Marland Kitchen RISPERIDONE 2 MG PO TABS Oral Take 2 mg by mouth at bedtime.    . TAMSULOSIN HCL 0.4 MG PO CAPS Oral Take 0.4 mg by mouth daily after supper.    . TRAZODONE HCL 100 MG PO TABS Oral Take 100 mg by mouth at bedtime.      Marland Kitchen HYDROCODONE-ACETAMINOPHEN 5-325 MG PO TABS Oral Take 1 tablet by mouth every 6 (six) hours as needed. pain    . LEVETIRACETAM 1000 MG PO TABS Oral Take 1,000 mg by mouth every 12 (twelve) hours.    Marland Kitchen LEVOTHYROXINE SODIUM 75 MCG PO TABS Oral Take 75 mcg by mouth daily.      Marland Kitchen NAPHAZOLINE HCL 0.1 % OP SOLN Both Eyes Place 1 drop into both eyes 4 (four) times daily as needed. For dry eyes    . OMEPRAZOLE 20 MG PO CPDR Oral Take 20 mg by mouth 2 (two) times daily.        BP 125/88  Pulse 96  Temp(Src) 97.7  F (36.5 C) (Oral)  Resp 22  SpO2 95%  Physical Exam  Nursing note and vitals reviewed. Constitutional: He is oriented to person, place, and time. He appears well-developed and well-nourished. No distress.  HENT:  Head: Normocephalic and atraumatic.  Eyes:       Normal appearance  Neck: Normal range of motion.  Cardiovascular: Normal rate, regular rhythm and intact distal pulses.   Pulmonary/Chest: Effort normal and breath sounds normal. No respiratory distress.       Mild left chest tenderness.  LUE non-tender and full ROM w/out pain.    Abdominal: Soft. Bowel sounds are normal. He exhibits no distension. There is no guarding.       Mild, diffuse ttp  Musculoskeletal:       No peripheral edema or calf tenderness  Neurological: He is alert and oriented to person, place, and time.       Stuttering speech  Skin: Skin is warm and dry. No rash noted.  Psychiatric: He has a normal mood and affect. His behavior is normal.    ED Course  Procedures (including critical care time)   Date: 02/18/2011  Rate: 88  Rhythm: normal sinus rhythm  QRS Axis: normal  Intervals: normal  ST/T Wave abnormalities: normal  Conduction Disutrbances:none  Narrative Interpretation:   Old EKG Reviewed: unchanged   Labs Reviewed - No data to display Dg Chest 1 View  02/17/2011  *RADIOLOGY REPORT*  Clinical Data: Shortness of breath.  CHEST - 1 VIEW  Comparison: 02/15/2011, 12/04/2010 and 10/06/2010 chestx-ray. 12/04/2010 CT.  Findings: Pulmonary vascular congestion.  No pneumothorax.  Lung parenchymal changes more notable on the left. This may represent sequelae of prior infection superimposed upon chronic changes.  This patient had adenopathy on the prior chest CT, and follow-up CT imaging at such time was recommended in 3 - 6 months. When this follow-up CT is obtained than the lung parenchymal changes can be readdressed to exclude malignancy.  When compared to the most recent chest x-ray examination  there has been no significant change.  IMPRESSION: No significant change since recent chest x-ray.  Follow up as discussed above.  Original Report Authenticated By: Fuller Canada, M.D.   Ct Head Wo Contrast  02/17/2011  *RADIOLOGY REPORT*  Clinical Data: Fall and hit head.  Left-sided numbness.  CT HEAD WITHOUT CONTRAST  Technique:  Contiguous axial images were obtained from the base of the skull through the vertex without contrast.  Comparison: 12/03/2003  Findings: There is motion artifact at the cerebellum despite obtaining additional images.  No evidence for acute hemorrhage, mass lesion, midline shift, hydrocephalus or large infarct.  There is diffuse mucosal disease in the maxillary sinuses and ethmoid air cells.  Visualized mastoid air cells are clear.  IMPRESSION: No acute intracranial abnormality.  Limited evaluation at the skull base and cerebellum due to motion artifact.  Paranasal sinus disease.  Original Report Authenticated By: Richarda Overlie, M.D.     1. Pneumonia       MDM  Pt w/ h/o mild MR as well as psychiatric illness presents w/ c/o non-traumatic left chest and LUE pain.  Has been admitted for same at the end of Jan (etiology thought to be COPD vs. GERD) and had a neg cardiac work up in ED on 02/15/12.   No acute findings on exam w/ exception of mild tenderness left anterior chest.  No signs of blood clot on exam but mother has h/o PE.  EKG non-ischemic.  CXR and labs, including a d-dimer, pending.  Breathing treatment and GI cocktail as well as colace for persistent diarrhea here in ED have been ordered.    Labs sig for elevated d-dimer and neg troponin.  Pt is currently stable but continues to have inspiratory CP.  CT angio chest has been ordered to r/o PE.    2nd troponin neg.  CT neg for PE but shows what appears to be an infectious pulmonary nodule at right lower lobe.  Results discussed w/ pt.  He has been reassured that etiology of left-sided CP is unlikely to be cardiac or  pulmonary.  D/c'd home w/ zpak, vicodin, imodium (continues to have diarrhea in ED) and referral to [facility].  He understands that he needs to have a repeat CT in 3 months and the importance of finding a PCP.  Also recommended that he drink plenty of fluids and continue his prilosec on a daily basis.  Return precautions discussed.         Arie Sabina [place], [place] 02/18/11 1549

## 2011-02-18 NOTE — ED Notes (Signed)
Pt having 3rd episode of diarrhea

## 2011-02-25 ENCOUNTER — Other Ambulatory Visit: Payer: Self-pay

## 2011-02-25 ENCOUNTER — Emergency Department (HOSPITAL_BASED_OUTPATIENT_CLINIC_OR_DEPARTMENT_OTHER)
Admission: EM | Admit: 2011-02-25 | Discharge: 2011-02-25 | Disposition: A | Discharge status: Home / Self Care | Payer: Medicare Other | Attending: Emergency Medicine | Admitting: Emergency Medicine

## 2011-02-25 ENCOUNTER — Encounter (HOSPITAL_BASED_OUTPATIENT_CLINIC_OR_DEPARTMENT_OTHER): Payer: Self-pay | Admitting: *Deleted

## 2011-02-25 ENCOUNTER — Emergency Department (INDEPENDENT_AMBULATORY_CARE_PROVIDER_SITE_OTHER): Payer: Medicare Other

## 2011-02-25 DIAGNOSIS — F259 Schizoaffective disorder, unspecified: Secondary | ICD-10-CM | POA: Insufficient documentation

## 2011-02-25 DIAGNOSIS — R0602 Shortness of breath: Secondary | ICD-10-CM

## 2011-02-25 DIAGNOSIS — F6381 Intermittent explosive disorder: Secondary | ICD-10-CM | POA: Insufficient documentation

## 2011-02-25 DIAGNOSIS — I252 Old myocardial infarction: Secondary | ICD-10-CM | POA: Insufficient documentation

## 2011-02-25 DIAGNOSIS — R05 Cough: Secondary | ICD-10-CM | POA: Insufficient documentation

## 2011-02-25 DIAGNOSIS — R059 Cough, unspecified: Secondary | ICD-10-CM | POA: Insufficient documentation

## 2011-02-25 DIAGNOSIS — R062 Wheezing: Secondary | ICD-10-CM | POA: Insufficient documentation

## 2011-02-25 DIAGNOSIS — R079 Chest pain, unspecified: Secondary | ICD-10-CM | POA: Insufficient documentation

## 2011-02-25 DIAGNOSIS — J441 Chronic obstructive pulmonary disease with (acute) exacerbation: Secondary | ICD-10-CM | POA: Insufficient documentation

## 2011-02-25 DIAGNOSIS — I10 Essential (primary) hypertension: Secondary | ICD-10-CM | POA: Insufficient documentation

## 2011-02-25 DIAGNOSIS — F79 Unspecified intellectual disabilities: Secondary | ICD-10-CM | POA: Insufficient documentation

## 2011-02-25 LAB — CBC
MCH: 26.4 pg (ref 26.0–34.0)
MCHC: 32.3 g/dL (ref 30.0–36.0)
Platelets: 237 10*3/uL (ref 150–400)
RBC: 4.51 MIL/uL (ref 4.22–5.81)

## 2011-02-25 LAB — DIFFERENTIAL
Basophils Absolute: 0 10*3/uL (ref 0.0–0.1)
Basophils Relative: 0 % (ref 0–1)
Eosinophils Absolute: 0.7 10*3/uL (ref 0.0–0.7)
Lymphs Abs: 1.2 10*3/uL (ref 0.7–4.0)
Neutrophils Relative %: 58 % (ref 43–77)

## 2011-02-25 LAB — CARDIAC PANEL(CRET KIN+CKTOT+MB+TROPI)
CK, MB: 2.3 ng/mL (ref 0.3–4.0)
Total CK: 66 U/L (ref 7–232)
Troponin I: <0.3 ng/mL (ref <0.30)

## 2011-02-25 LAB — BASIC METABOLIC PANEL
GFR calc Af Amer: >90 mL/min (ref >90)
GFR calc non Af Amer: >90 mL/min (ref >90)
Potassium: 3.8 mEq/L (ref 3.5–5.1)
Sodium: 144 mEq/L (ref 135–145)

## 2011-02-25 MED ORDER — IPRATROPIUM BROMIDE 0.02 % IN SOLN
RESPIRATORY_TRACT | Status: AC
  Administered 2011-02-25: 07:00:00
  Filled 2011-02-25: qty 2.5

## 2011-02-25 MED ORDER — METHYLPREDNISOLONE SODIUM SUCC 125 MG IJ SOLR
INTRAMUSCULAR | Status: AC
  Administered 2011-02-25: 07:00:00
  Filled 2011-02-25: qty 2

## 2011-02-25 MED ORDER — ALBUTEROL SULFATE (5 MG/ML) 0.5% IN NEBU
INHALATION_SOLUTION | RESPIRATORY_TRACT | Status: AC
  Administered 2011-02-25: 07:00:00
  Filled 2011-02-25: qty 2

## 2011-02-25 MED ORDER — ALBUTEROL SULFATE (5 MG/ML) 0.5% IN NEBU
5.0000 mg | INHALATION_SOLUTION | Freq: Once | RESPIRATORY_TRACT | Status: AC
  Administered 2011-02-25: 5 mg via RESPIRATORY_TRACT
  Filled 2011-02-25: qty 1

## 2011-02-25 MED ORDER — ALBUTEROL SULFATE HFA 108 (90 BASE) MCG/ACT IN AERS
2.0000 | INHALATION_SPRAY | RESPIRATORY_TRACT | Status: DC | PRN
  Administered 2011-02-25: 2 via RESPIRATORY_TRACT
  Filled 2011-02-25: qty 6.7

## 2011-02-25 MED ORDER — PREDNISONE 10 MG PO TABS: 20.0000 mg | ORAL_TABLET | Freq: Every day | ORAL | Status: DC

## 2011-02-25 MED ORDER — IPRATROPIUM BROMIDE 0.02 % IN SOLN: 0.5000 mg | Freq: Once | RESPIRATORY_TRACT | Status: DC

## 2011-02-25 NOTE — Discharge Instructions (Signed)
Return to the ED with any concerns including difficulty breathing, chest pain, vomiting and not able to keep down liquids, decreased level of alertness or lethargy, or any other alarming symptoms.

## 2011-02-25 NOTE — ED Provider Notes (Addendum)
History     CSN: [id number]  Arrival date & time 02/25/11  4696   None     Chief Complaint  Patient presents with  . Shortness of Breath    (Consider location/radiation/quality/duration/timing/severity/associated sxs/prior treatment) HPI Comments: Patient was brought in this morning by [facility] with complaints of wheezing and shortness of breath. He has a history of COPD and also mental retardation and schizoaffective disorder. He states that his breathing started getting bad yesterday and has progressed today. He states it feels like his past COPD exacerbations. He does complain of some sharp pain in the left side of his chest. This has been a recurrent complaint for him over the last 2 months. He says it feels similar to his last episodes. He's had frequent visits recently for shortness of breath and chest pain. He was most recently seen on February 17 for chest pain he had negative cardiac enzymes and a negative CT scan for PE. He also had a negative CT scan for PE on December 3. He was also seen on February 16 for shortness of breath and COPD exacerbation. He was seen on February 14 for chest pain which was deemed to be likely noncardiac. He was also admitted on January 30 and discharged with COPD and chest pain most likely noncardiac. He states his chest pain has been on and off for last several months it's worse with movement worse with coughing and worse with breathing. Denies any pain or swelling in his extremities.  he denies any fevers he does complain of a cough which is productive of yellow sputum. He currently resides in a group home.  Patient is a 56 y.o. male presenting with shortness of breath. The history is provided by the patient.  Shortness of Breath  The current episode started yesterday. Associated symptoms include chest pain, cough, shortness of breath and wheezing. Pertinent negatives include no fever and no rhinorrhea.    Past Medical History  Diagnosis Date  .  Myocardial infarct, old   . Seizures   . Psychiatric disorder   . Schizoaffective disorder   . Organic mood disorder   . Intermittent explosive disorder   . Generalized anxiety disorder   . Mild mental retardation   . COPD (chronic obstructive pulmonary disease)   . Hypertension   . Peptic ulcer   . Thyroid disease   . Prostate hyperplasia with urinary obstruction   . Blind left eye     History reviewed. No pertinent past surgical history.  Family History  Problem Relation Age of Onset  . Hypertension      History  Substance Use Topics  . Smoking status: Current Everyday Smoker -- 0.2 packs/day    Types: Cigarettes  . Smokeless tobacco: Never Used  . Alcohol Use: No      Review of Systems  Constitutional: Negative for fever, chills, diaphoresis and fatigue.  HENT: Positive for congestion. Negative for rhinorrhea and sneezing.   Eyes: Negative.   Respiratory: Positive for cough, shortness of breath and wheezing. Negative for chest tightness.   Cardiovascular: Positive for chest pain. Negative for leg swelling.  Gastrointestinal: Negative for nausea, vomiting, abdominal pain, diarrhea and blood in stool.  Genitourinary: Negative for frequency, hematuria, flank pain and difficulty urinating.  Musculoskeletal: Negative for back pain and arthralgias.  Skin: Negative for rash.  Neurological: Negative for dizziness, speech difficulty, weakness, numbness and headaches.    Allergies  Peanut-containing drug products; Coffee bean extract; Prolixin; Thorazine; and Lactose intolerance (gi)  Home  Medications   Current Outpatient Rx  Name Route Sig Dispense Refill  . IPRATROPIUM-ALBUTEROL 18-103 MCG/ACT IN AERO Inhalation Inhale 2 puffs into the lungs every 6 (six) hours as needed. Shortness of breath.     . AMLODIPINE BESYLATE 10 MG PO TABS Oral Take 10 mg by mouth daily.      . ASPIRIN 81 MG PO CHEW Oral Chew 324 mg by mouth once.     . ASPIRIN EC 81 MG PO TBEC Oral Take 81  mg by mouth daily.    Marland Kitchen DIPHENHYDRAMINE HCL 50 MG PO CAPS Oral Take 50 mg by mouth every 6 (six) hours as needed. For insomnia    . DOCUSATE SODIUM 100 MG PO CAPS Oral Take 100 mg by mouth 2 (two) times daily.    Marland Kitchen FLUTICASONE PROPIONATE  HFA 44 MCG/ACT IN AERO Inhalation Inhale 2 puffs into the lungs 2 (two) times daily.    Marland Kitchen HALOPERIDOL 2 MG PO TABS Oral Take 2 mg by mouth every 6 (six) hours as needed. agitation    . HYDROCODONE-ACETAMINOPHEN 5-325 MG PO TABS Oral Take 1 tablet by mouth every 6 (six) hours as needed. pain    . HYDROCODONE-ACETAMINOPHEN 5-325 MG PO TABS Oral Take 1 tablet by mouth every 4 (four) hours as needed for pain. 15 tablet 0  . LEVETIRACETAM 1000 MG PO TABS Oral Take 1,000 mg by mouth every 12 (twelve) hours.    Marland Kitchen LEVOTHYROXINE SODIUM 75 MCG PO TABS Oral Take 75 mcg by mouth daily.      Marland Kitchen LISINOPRIL 5 MG PO TABS Oral Take 5 mg by mouth daily.      Marland Kitchen LITHIUM CARBONATE 300 MG PO CAPS Oral Take 300 mg by mouth 3 (three) times daily with meals.    Marland Kitchen LOPERAMIDE HCL 2 MG PO CAPS Oral Take 1 capsule (2 mg total) by mouth 4 (four) times daily as needed for diarrhea or loose stools. 12 capsule 0  . OMEPRAZOLE 20 MG PO CPDR Oral Take 20 mg by mouth 2 (two) times daily.      Marland Kitchen POTASSIUM CHLORIDE 20 MEQ PO PACK Oral Take 20 mEq by mouth daily.      Marland Kitchen RISPERIDONE 1 MG PO TABS Oral Take 1 mg by mouth 2 (two) times daily.    Marland Kitchen RISPERIDONE 2 MG PO TABS Oral Take 2 mg by mouth at bedtime.    . TAMSULOSIN HCL 0.4 MG PO CAPS Oral Take 0.4 mg by mouth daily after supper.    . TRAZODONE HCL 100 MG PO TABS Oral Take 100 mg by mouth at bedtime.        BP 133/92  Pulse 115  Temp(Src) 97.4 F (36.3 C) (Oral)  Resp 20  SpO2 100%  Physical Exam  Constitutional: He is oriented to person, place, and time. He appears well-developed and well-nourished.  HENT:  Head: Normocephalic and atraumatic.  Eyes: Pupils are equal, round, and reactive to light.  Neck: Normal range of motion. Neck  supple.  Cardiovascular: Normal rate, regular rhythm and normal heart sounds.   Pulmonary/Chest: Effort normal. No respiratory distress. He has wheezes. He has no rales. He exhibits tenderness.       Patient has wheezing in all lung fields with some decreased breath sounds bilaterally. He is talking in full senses and has no accessory muscle use. He does have some tenderness on palpation of the left anterior chest wall.  Abdominal: Soft. Bowel sounds are normal. There is no tenderness. There  is no rebound and no guarding.  Musculoskeletal: Normal range of motion. He exhibits no edema.  Lymphadenopathy:    He has no cervical adenopathy.  Neurological: He is alert and oriented to person, place, and time.  Skin: Skin is warm and dry. No rash noted.  Psychiatric: He has a normal mood and affect.    ED Course  Procedures (including critical care time)  Labs Reviewed  CBC - Abnormal; Notable for the following:    Hemoglobin 11.9 (*)    HCT 36.8 (*)    All other components within normal limits  DIFFERENTIAL - Abnormal; Notable for the following:    Eosinophils Relative 13 (*)    All other components within normal limits  BASIC METABOLIC PANEL  CARDIAC PANEL(CRET KIN+CKTOT+MB+TROPI)    Date: 02/25/2011  Rate: 120  Rhythm: sinus tachycardia  QRS Axis: normal  Intervals: normal  ST/T Wave abnormalities: nonspecific ST/T changes  Conduction Disutrbances:none  Narrative Interpretation:   Old EKG Reviewed: unchanged     1. COPD exacerbation       MDM  Patient was given 2 albuterol nebs and Atrovent nebs by EMS as well as Solu-Medrol by EMS. At this point he is maintaining normal oxygen saturations at 97% however he is still fairly wheezy. In focal head and check labs cardiac enzymes and EKG as well as a chest x-ray and monitor him for improvement  06:53 will turn pt over to Dr Karma Ganja pending labs, CXR.      Rolan Bucco, MD 02/25/11 4403  Rolan Bucco, MD 02/25/11  [phone]

## 2011-02-25 NOTE — ED Notes (Signed)
Per EMS, Pt was [place] at [address] c/o "difficulty breathing"  Pt has hx of MR and has caretaker who is not on scene with pt.  Pt is extremely poor historian and constantly changes story and is unable to recall DOB or other general information about self.  Pt gave name as [name].

## 2011-02-25 NOTE — ED Notes (Signed)
Pt presented to the ED with some [employer] via EMS. Pt has received to [facility] tx per EMS of Albuterol/Atrovent 5.5 mg as the first tx and then a second tx of Albuterol 5 mg via EMS and finished tx while in room.

## 2011-02-25 NOTE — ED Notes (Signed)
5 Albuterol and 0.5 Atrvoent for 1 treatmeant.  2nd treatment just albuterol 5mg .Solumedrol 125mg  given by EMS

## 2011-02-25 NOTE — ED Provider Notes (Signed)
Patient signed out to me at end of shift. His chest x-ray shows bronchitic changes which are chronic. His laboratory results are reassuring. I have given him one more albuterol nebulizer treatment. I do not hear any wheezing on his examination. Initially he was 89% on room air and satting 100% on 2 L by nasal cannula. He was weaned to room air now and is satting approximately 95% and continues to be in no distress  Ethelda Chick, MD 02/25/11 1547

## 2011-02-25 NOTE — ED Notes (Signed)
O2 weaned off to room air.  Will continue to monitor.

## 2011-02-25 NOTE — ED Notes (Signed)
Ptar called for transport home  

## 2011-02-25 NOTE — ED Notes (Signed)
Pt off oxygen.  No resp distress.  Pox between 94-97% on RA.  Pt eating breakfast without difficulty.

## 2011-03-04 ENCOUNTER — Encounter (HOSPITAL_COMMUNITY): Payer: Self-pay | Admitting: Emergency Medicine

## 2011-03-04 ENCOUNTER — Emergency Department (HOSPITAL_COMMUNITY): Payer: Medicare Other

## 2011-03-04 ENCOUNTER — Observation Stay (HOSPITAL_COMMUNITY)
Admission: EM | Admit: 2011-03-04 | Discharge: 2011-03-05 | Disposition: A | Discharge status: Home / Self Care | Payer: Medicare Other | Attending: Internal Medicine | Admitting: Internal Medicine

## 2011-03-04 ENCOUNTER — Other Ambulatory Visit: Payer: Self-pay

## 2011-03-04 DIAGNOSIS — R079 Chest pain, unspecified: Secondary | ICD-10-CM | POA: Diagnosis present

## 2011-03-04 DIAGNOSIS — F259 Schizoaffective disorder, unspecified: Secondary | ICD-10-CM | POA: Diagnosis present

## 2011-03-04 DIAGNOSIS — Z720 Tobacco use: Secondary | ICD-10-CM | POA: Diagnosis present

## 2011-03-04 DIAGNOSIS — M79609 Pain in unspecified limb: Secondary | ICD-10-CM | POA: Insufficient documentation

## 2011-03-04 DIAGNOSIS — N401 Enlarged prostate with lower urinary tract symptoms: Secondary | ICD-10-CM | POA: Insufficient documentation

## 2011-03-04 DIAGNOSIS — F17200 Nicotine dependence, unspecified, uncomplicated: Secondary | ICD-10-CM | POA: Insufficient documentation

## 2011-03-04 DIAGNOSIS — F79 Unspecified intellectual disabilities: Secondary | ICD-10-CM

## 2011-03-04 DIAGNOSIS — R071 Chest pain on breathing: Secondary | ICD-10-CM

## 2011-03-04 DIAGNOSIS — J449 Chronic obstructive pulmonary disease, unspecified: Secondary | ICD-10-CM

## 2011-03-04 DIAGNOSIS — F200 Paranoid schizophrenia: Secondary | ICD-10-CM

## 2011-03-04 DIAGNOSIS — N400 Enlarged prostate without lower urinary tract symptoms: Secondary | ICD-10-CM | POA: Diagnosis present

## 2011-03-04 DIAGNOSIS — Z590 Homelessness: Secondary | ICD-10-CM

## 2011-03-04 DIAGNOSIS — N138 Other obstructive and reflux uropathy: Secondary | ICD-10-CM | POA: Insufficient documentation

## 2011-03-04 DIAGNOSIS — I10 Essential (primary) hypertension: Secondary | ICD-10-CM

## 2011-03-04 DIAGNOSIS — E039 Hypothyroidism, unspecified: Secondary | ICD-10-CM | POA: Diagnosis present

## 2011-03-04 DIAGNOSIS — M79603 Pain in arm, unspecified: Secondary | ICD-10-CM

## 2011-03-04 DIAGNOSIS — F70 Mild intellectual disabilities: Secondary | ICD-10-CM | POA: Diagnosis present

## 2011-03-04 DIAGNOSIS — R569 Unspecified convulsions: Secondary | ICD-10-CM | POA: Diagnosis present

## 2011-03-04 DIAGNOSIS — J441 Chronic obstructive pulmonary disease with (acute) exacerbation: Principal | ICD-10-CM | POA: Diagnosis present

## 2011-03-04 DIAGNOSIS — I252 Old myocardial infarction: Secondary | ICD-10-CM | POA: Insufficient documentation

## 2011-03-04 DIAGNOSIS — R0602 Shortness of breath: Secondary | ICD-10-CM | POA: Insufficient documentation

## 2011-03-04 LAB — CARDIAC PANEL(CRET KIN+CKTOT+MB+TROPI)
Relative Index: INVALID (ref 0.0–2.5)
Relative Index: INVALID (ref 0.0–2.5)
Total CK: 73 U/L (ref 7–232)
Troponin I: <0.3 ng/mL (ref <0.30)

## 2011-03-04 LAB — COMPREHENSIVE METABOLIC PANEL
ALT: 11 U/L (ref 0–53)
AST: 15 U/L (ref 0–37)
Albumin: 3.6 g/dL (ref 3.5–5.2)
Alkaline Phosphatase: 72 U/L (ref 39–117)
Alkaline Phosphatase: 71 U/L (ref 39–117)
BUN: 12 mg/dL (ref 6–23)
CO2: 29 mEq/L (ref 19–32)
Chloride: 103 mEq/L (ref 96–112)
Chloride: 103 mEq/L (ref 96–112)
Creatinine, Ser: 0.99 mg/dL (ref 0.50–1.35)
GFR calc Af Amer: >90 mL/min (ref >90)
GFR calc Af Amer: >90 mL/min (ref >90)
GFR calc non Af Amer: >90 mL/min (ref >90)
GFR calc non Af Amer: 89 mL/min — ABNORMAL LOW (ref >90)
Glucose, Bld: 102 mg/dL — ABNORMAL HIGH (ref 70–99)
Glucose, Bld: 160 mg/dL — ABNORMAL HIGH (ref 70–99)
Potassium: 3.5 mEq/L (ref 3.5–5.1)
Sodium: 141 mEq/L (ref 135–145)
Total Bilirubin: 0.2 mg/dL — ABNORMAL LOW (ref 0.3–1.2)

## 2011-03-04 LAB — HEMOGLOBIN A1C
Hgb A1c MFr Bld: 5.4 % (ref <5.7)
Mean Plasma Glucose: 108 mg/dL (ref <117)

## 2011-03-04 LAB — CBC
Hemoglobin: 12.6 g/dL — ABNORMAL LOW (ref 13.0–17.0)
RBC: 4.83 MIL/uL (ref 4.22–5.81)
WBC: 7 10*3/uL (ref 4.0–10.5)

## 2011-03-04 LAB — PRO B NATRIURETIC PEPTIDE: Pro B Natriuretic peptide (BNP): 87.7 pg/mL (ref 0–125)

## 2011-03-04 LAB — TSH: TSH: 1.553 u[IU]/mL (ref 0.350–4.500)

## 2011-03-04 MED ORDER — HALOPERIDOL 2 MG PO TABS
2.0000 mg | ORAL_TABLET | Freq: Three times a day (TID) | ORAL | Status: DC | PRN
  Administered 2011-03-05: 2 mg via ORAL
  Filled 2011-03-04: qty 1

## 2011-03-04 MED ORDER — TAMSULOSIN HCL 0.4 MG PO CAPS
0.4000 mg | ORAL_CAPSULE | ORAL | Status: DC
  Administered 2011-03-04 – 2011-03-05 (×2): 0.4 mg via ORAL
  Filled 2011-03-04 (×2): qty 1

## 2011-03-04 MED ORDER — ASPIRIN 81 MG PO CHEW
324.0000 mg | CHEWABLE_TABLET | Freq: Once | ORAL | Status: AC
  Administered 2011-03-04: 324 mg via ORAL

## 2011-03-04 MED ORDER — ALBUTEROL SULFATE (5 MG/ML) 0.5% IN NEBU
5.0000 mg | INHALATION_SOLUTION | Freq: Once | RESPIRATORY_TRACT | Status: AC
  Administered 2011-03-04: 5 mg via RESPIRATORY_TRACT
  Filled 2011-03-04: qty 1

## 2011-03-04 MED ORDER — SODIUM CHLORIDE 0.9 % IJ SOLN: 3.0000 mL | Freq: Two times a day (BID) | INTRAMUSCULAR | Status: DC

## 2011-03-04 MED ORDER — TRAZODONE HCL 100 MG PO TABS
100.0000 mg | ORAL_TABLET | Freq: Every day | ORAL | Status: DC
  Administered 2011-03-04: 100 mg via ORAL
  Filled 2011-03-04 (×2): qty 1

## 2011-03-04 MED ORDER — LITHIUM CARBONATE 300 MG PO CAPS
300.0000 mg | ORAL_CAPSULE | Freq: Three times a day (TID) | ORAL | Status: DC
  Administered 2011-03-04 – 2011-03-05 (×3): 300 mg via ORAL
  Filled 2011-03-04 (×5): qty 1

## 2011-03-04 MED ORDER — IPRATROPIUM BROMIDE 0.02 % IN SOLN
0.5000 mg | RESPIRATORY_TRACT | Status: DC
  Administered 2011-03-04 – 2011-03-05 (×7): 0.5 mg via RESPIRATORY_TRACT
  Filled 2011-03-04 (×7): qty 2.5

## 2011-03-04 MED ORDER — ONDANSETRON HCL 4 MG PO TABS: 4.0000 mg | ORAL_TABLET | Freq: Four times a day (QID) | ORAL | Status: DC | PRN

## 2011-03-04 MED ORDER — AMLODIPINE BESYLATE 10 MG PO TABS
10.0000 mg | ORAL_TABLET | Freq: Once | ORAL | Status: AC
  Administered 2011-03-04: 10 mg via ORAL
  Filled 2011-03-04: qty 1

## 2011-03-04 MED ORDER — DIPHENHYDRAMINE HCL 25 MG PO CAPS: 50.0000 mg | ORAL_CAPSULE | Freq: Four times a day (QID) | ORAL | Status: DC | PRN

## 2011-03-04 MED ORDER — SODIUM CHLORIDE 0.9 % IJ SOLN: 3.0000 mL | INTRAMUSCULAR | Status: DC | PRN

## 2011-03-04 MED ORDER — RISPERIDONE 1 MG PO TABS
1.0000 mg | ORAL_TABLET | ORAL | Status: DC
  Administered 2011-03-04 – 2011-03-05 (×3): 1 mg via ORAL
  Filled 2011-03-04 (×4): qty 1

## 2011-03-04 MED ORDER — ASPIRIN 81 MG PO CHEW
324.0000 mg | CHEWABLE_TABLET | Freq: Once | ORAL | Status: AC
  Administered 2011-03-04: 324 mg via ORAL
  Filled 2011-03-04: qty 4

## 2011-03-04 MED ORDER — ENOXAPARIN SODIUM 40 MG/0.4ML ~~LOC~~ SOLN
40.0000 mg | SUBCUTANEOUS | Status: DC
  Administered 2011-03-04 – 2011-03-05 (×2): 40 mg via SUBCUTANEOUS
  Filled 2011-03-04 (×2): qty 0.4

## 2011-03-04 MED ORDER — METHYLPREDNISOLONE SODIUM SUCC 40 MG IJ SOLR
40.0000 mg | Freq: Four times a day (QID) | INTRAMUSCULAR | Status: DC
  Administered 2011-03-04 – 2011-03-05 (×3): 40 mg via INTRAVENOUS
  Filled 2011-03-04 (×7): qty 1

## 2011-03-04 MED ORDER — ACETAMINOPHEN 325 MG PO TABS
650.0000 mg | ORAL_TABLET | Freq: Four times a day (QID) | ORAL | Status: DC | PRN
Start: 2011-03-04 — End: 2011-03-05

## 2011-03-04 MED ORDER — AMLODIPINE BESYLATE 10 MG PO TABS
10.0000 mg | ORAL_TABLET | Freq: Every day | ORAL | Status: DC
  Administered 2011-03-04 – 2011-03-05 (×2): 10 mg via ORAL
  Filled 2011-03-04 (×2): qty 1

## 2011-03-04 MED ORDER — LEVOTHYROXINE SODIUM 75 MCG PO TABS
75.0000 ug | ORAL_TABLET | Freq: Every day | ORAL | Status: DC
  Administered 2011-03-05: 75 ug via ORAL
  Filled 2011-03-04 (×2): qty 1

## 2011-03-04 MED ORDER — FLUTICASONE PROPIONATE HFA 44 MCG/ACT IN AERO
2.0000 | INHALATION_SPRAY | Freq: Two times a day (BID) | RESPIRATORY_TRACT | Status: DC
  Administered 2011-03-04 – 2011-03-05 (×2): 2 via RESPIRATORY_TRACT
  Filled 2011-03-04: qty 10.6

## 2011-03-04 MED ORDER — LEVETIRACETAM 500 MG PO TABS
1000.0000 mg | ORAL_TABLET | Freq: Two times a day (BID) | ORAL | Status: DC
  Administered 2011-03-04 – 2011-03-05 (×3): 1000 mg via ORAL
  Filled 2011-03-04 (×4): qty 2

## 2011-03-04 MED ORDER — PREDNISONE 20 MG PO TABS
20.0000 mg | ORAL_TABLET | Freq: Every day | ORAL | Status: DC
  Administered 2011-03-04: 20 mg via ORAL
  Filled 2011-03-04: qty 1

## 2011-03-04 MED ORDER — METHYLPREDNISOLONE SODIUM SUCC 125 MG IJ SOLR
125.0000 mg | Freq: Once | INTRAMUSCULAR | Status: AC
  Administered 2011-03-04: 125 mg via INTRAVENOUS
  Filled 2011-03-04: qty 2

## 2011-03-04 MED ORDER — MORPHINE SULFATE 4 MG/ML IJ SOLN
4.0000 mg | Freq: Once | INTRAMUSCULAR | Status: AC
  Administered 2011-03-04: 4 mg via INTRAVENOUS
  Filled 2011-03-04: qty 1

## 2011-03-04 MED ORDER — SODIUM CHLORIDE 0.9 % IJ SOLN
3.0000 mL | Freq: Two times a day (BID) | INTRAMUSCULAR | Status: DC
  Administered 2011-03-04 – 2011-03-05 (×2): 3 mL via INTRAVENOUS

## 2011-03-04 MED ORDER — LISINOPRIL 5 MG PO TABS
5.0000 mg | ORAL_TABLET | Freq: Every day | ORAL | Status: DC
  Administered 2011-03-04 – 2011-03-05 (×2): 5 mg via ORAL
  Filled 2011-03-04 (×2): qty 1

## 2011-03-04 MED ORDER — ASPIRIN EC 81 MG PO TBEC
81.0000 mg | DELAYED_RELEASE_TABLET | Freq: Every day | ORAL | Status: DC
  Administered 2011-03-05: 81 mg via ORAL
  Filled 2011-03-04: qty 1

## 2011-03-04 MED ORDER — LEVETIRACETAM 500 MG PO TABS: 1000.0000 mg | ORAL_TABLET | Freq: Two times a day (BID) | ORAL | Status: DC

## 2011-03-04 MED ORDER — SODIUM CHLORIDE 0.9 % IV SOLN: 250.0000 mL | INTRAVENOUS | Status: DC | PRN

## 2011-03-04 MED ORDER — POTASSIUM CHLORIDE CRYS ER 20 MEQ PO TBCR
20.0000 meq | EXTENDED_RELEASE_TABLET | Freq: Every day | ORAL | Status: DC
  Administered 2011-03-04 – 2011-03-05 (×2): 20 meq via ORAL
  Filled 2011-03-04: qty 1

## 2011-03-04 MED ORDER — POTASSIUM CHLORIDE 20 MEQ PO PACK
20.0000 meq | PACK | Freq: Every day | ORAL | Status: DC
  Filled 2011-03-04: qty 1

## 2011-03-04 MED ORDER — ALBUTEROL SULFATE (5 MG/ML) 0.5% IN NEBU
2.5000 mg | INHALATION_SOLUTION | RESPIRATORY_TRACT | Status: DC
  Administered 2011-03-04 – 2011-03-05 (×7): 2.5 mg via RESPIRATORY_TRACT
  Filled 2011-03-04 (×7): qty 0.5

## 2011-03-04 MED ORDER — DOCUSATE SODIUM 100 MG PO CAPS
100.0000 mg | ORAL_CAPSULE | Freq: Two times a day (BID) | ORAL | Status: DC
  Administered 2011-03-04 – 2011-03-05 (×2): 100 mg via ORAL
  Filled 2011-03-04 (×2): qty 1

## 2011-03-04 MED ORDER — ONDANSETRON HCL 4 MG/2ML IJ SOLN: 4.0000 mg | Freq: Four times a day (QID) | INTRAMUSCULAR | Status: DC | PRN

## 2011-03-04 MED ORDER — ACETAMINOPHEN 650 MG RE SUPP: 650.0000 mg | Freq: Four times a day (QID) | RECTAL | Status: DC | PRN

## 2011-03-04 MED ORDER — LISINOPRIL 5 MG PO TABS
5.0000 mg | ORAL_TABLET | Freq: Once | ORAL | Status: AC
  Administered 2011-03-04: 5 mg via ORAL
  Filled 2011-03-04: qty 1

## 2011-03-04 MED ORDER — PANTOPRAZOLE SODIUM 40 MG PO TBEC
40.0000 mg | DELAYED_RELEASE_TABLET | Freq: Every day | ORAL | Status: DC
  Administered 2011-03-04 – 2011-03-05 (×2): 40 mg via ORAL
  Filled 2011-03-04: qty 1

## 2011-03-04 MED ORDER — OXYCODONE-ACETAMINOPHEN 5-325 MG PO TABS
2.0000 | ORAL_TABLET | Freq: Once | ORAL | Status: AC
  Administered 2011-03-04: 2 via ORAL
  Filled 2011-03-04: qty 2

## 2011-03-04 MED ORDER — RISPERIDONE 2 MG PO TABS
2.0000 mg | ORAL_TABLET | Freq: Every day | ORAL | Status: DC
  Administered 2011-03-04: 2 mg via ORAL
  Filled 2011-03-04 (×3): qty 1

## 2011-03-04 MED ORDER — IPRATROPIUM-ALBUTEROL 18-103 MCG/ACT IN AERO
2.0000 | INHALATION_SPRAY | Freq: Four times a day (QID) | RESPIRATORY_TRACT | Status: DC | PRN
  Filled 2011-03-04: qty 14.7

## 2011-03-04 MED ORDER — KETOROLAC TROMETHAMINE 30 MG/ML IJ SOLN
30.0000 mg | Freq: Once | INTRAMUSCULAR | Status: AC
  Administered 2011-03-04: 30 mg via INTRAVENOUS
  Filled 2011-03-04: qty 1

## 2011-03-04 NOTE — ED Notes (Signed)
Pt O2 is 92% and he is taking a breathing treatment at time of vital signs.

## 2011-03-04 NOTE — ED Notes (Signed)
Albuterol and atrovent tx given. 10 albuterol and .5 atrovent given by EMS total.

## 2011-03-04 NOTE — ED Provider Notes (Signed)
History     CSN: [id number]  Arrival date & time 03/04/11  0705   First MD Initiated Contact with Patient 03/04/11 [id number]      Chief Complaint  Patient presents with  . Shortness of Breath    (Consider location/radiation/quality/duration/timing/severity/associated sxs/prior treatment) HPI Patient is a 56 year old male with a history of COPD, MI, paranoid schizophrenia, and partial mental retardation who presents today by EMS for evaluation of shortness of breath. Patient reports that he has had increasing shortness of breath over the past 2 days. He had been started on prednisone by his primary care provider. Patient endorses 5/10 substernal chest pain. He cannot say whether it is like his prior heart attack or versus previous asthma and COPD exacerbations. Patient is wheezing upon arrival. He denies any fevers but does endorse cough. He has no known sick contacts. He denies any lower extremity swelling. Patient denies history of heart failure. He has not taken his home medications this morning. Symptoms are worse with exertion and better with rest. There are no other associated or modifying factors. Past Medical History  Diagnosis Date  . Myocardial infarct, old   . Seizures   . Psychiatric disorder   . Schizoaffective disorder   . Organic mood disorder   . Intermittent explosive disorder   . Generalized anxiety disorder   . Mild mental retardation   . COPD (chronic obstructive pulmonary disease)   . Hypertension   . Peptic ulcer   . Thyroid disease   . Prostate hyperplasia with urinary obstruction   . Blind left eye     No past surgical history on file.  Family History  Problem Relation Age of Onset  . Hypertension      History  Substance Use Topics  . Smoking status: Current Everyday Smoker -- 0.2 packs/day    Types: Cigarettes  . Smokeless tobacco: Never Used  . Alcohol Use: No      Review of Systems  Constitutional: Negative.   HENT: Negative.   Eyes:  Negative.   Respiratory: Positive for cough.   Cardiovascular: Positive for chest pain.  Gastrointestinal: Negative.   Genitourinary: Negative.   Musculoskeletal: Negative.   Skin: Negative.   Neurological: Negative.   Hematological: Negative.   Psychiatric/Behavioral: Negative.   All other systems reviewed and are negative.    Allergies  Peanut-containing drug products; Coffee bean extract; Prolixin; Thorazine; and Lactose intolerance (gi)  Home Medications   Current Outpatient Rx  Name Route Sig Dispense Refill  . IPRATROPIUM-ALBUTEROL 18-103 MCG/ACT IN AERO Inhalation Inhale 2 puffs into the lungs every 6 (six) hours as needed. Shortness of breath.     . AMLODIPINE BESYLATE 10 MG PO TABS Oral Take 10 mg by mouth daily.      . ASPIRIN 81 MG PO CHEW Oral Chew 324 mg by mouth once.     . ASPIRIN EC 81 MG PO TBEC Oral Take 81 mg by mouth daily.    Marland Kitchen DIPHENHYDRAMINE HCL 50 MG PO CAPS Oral Take 50 mg by mouth every 6 (six) hours as needed. For insomnia    . DOCUSATE SODIUM 100 MG PO CAPS Oral Take 100 mg by mouth 2 (two) times daily.    Marland Kitchen FLUTICASONE PROPIONATE  HFA 44 MCG/ACT IN AERO Inhalation Inhale 2 puffs into the lungs 2 (two) times daily.    Marland Kitchen HALOPERIDOL 2 MG PO TABS Oral Take 2 mg by mouth every 6 (six) hours as needed. agitation    . HYDROCODONE-ACETAMINOPHEN 5-325 MG  PO TABS Oral Take 1 tablet by mouth every 6 (six) hours as needed. pain    . LEVETIRACETAM 1000 MG PO TABS Oral Take 1,000 mg by mouth every 12 (twelve) hours.    Marland Kitchen LEVOTHYROXINE SODIUM 75 MCG PO TABS Oral Take 75 mcg by mouth daily.      Marland Kitchen LISINOPRIL 5 MG PO TABS Oral Take 5 mg by mouth daily.      Marland Kitchen LITHIUM CARBONATE 300 MG PO CAPS Oral Take 300 mg by mouth 3 (three) times daily with meals.    . OMEPRAZOLE 20 MG PO CPDR Oral Take 20 mg by mouth 2 (two) times daily.      Marland Kitchen POTASSIUM CHLORIDE 20 MEQ PO PACK Oral Take 20 mEq by mouth daily.      Marland Kitchen PREDNISONE 10 MG PO TABS Oral Take 2 tablets (20 mg total) by  mouth daily. 10 tablet 0  . RISPERIDONE 1 MG PO TABS Oral Take 1 mg by mouth 2 (two) times daily.    Marland Kitchen RISPERIDONE 2 MG PO TABS Oral Take 2 mg by mouth at bedtime.    . TAMSULOSIN HCL 0.4 MG PO CAPS Oral Take 0.4 mg by mouth daily after supper.    . TRAZODONE HCL 100 MG PO TABS Oral Take 100 mg by mouth at bedtime.        BP 133/79  Pulse 99  Temp(Src) 97.8 F (36.6 C) (Oral)  Resp 22  SpO2 95%  Physical Exam  Nursing note and vitals reviewed. GEN: Well-developed, well-nourished male in no distress, poor hygiene HEENT: Atraumatic, normocephalic.  EYES: PERRLA BL, no scleral icterus. NECK: Trachea midline, no meningismus CV: regular rate and rhythm. No murmurs, rubs, or gallops PULM: No respiratory distress.  Patient has diffuse rales and an occasional wheeze appreciated. This improves with coughing.  GI: soft, non-tender. No guarding, rebound, or tenderness. + bowel sounds  Neuro: cranial nerves 2-12 intact, no abnormalities of strength or sensation, A and O x 3 MSK: Patient moves all 4 extremities symmetrically, no deformity, edema, or injury noted Psych: no abnormality of mood   ED Course  Procedures (including critical care time)   Date: 03/04/2011  Rate: 98  Rhythm: normal sinus rhythm  QRS Axis: normal  Intervals: normal  ST/T Wave abnormalities: nonspecific ST/T changes  Conduction Disutrbances:none  Narrative Interpretation: No acute ischemic changes compared to EKG from February 24 of 2013.  Old EKG Reviewed: unchanged   Labs Reviewed  CBC - Abnormal; Notable for the following:    Hemoglobin 12.6 (*)    All other components within normal limits  COMPREHENSIVE METABOLIC PANEL - Abnormal; Notable for the following:    Glucose, Bld 102 (*)    Total Bilirubin 0.2 (*)    All other components within normal limits  COMPREHENSIVE METABOLIC PANEL - Abnormal; Notable for the following:    Glucose, Bld 160 (*)    Total Bilirubin 0.2 (*)    GFR calc non Af Amer 89 (*)     All other components within normal limits  PRO B NATRIURETIC PEPTIDE  POCT I-STAT TROPONIN I  POCT I-STAT TROPONIN I  D-DIMER, QUANTITATIVE  CARDIAC PANEL(CRET KIN+CKTOT+MB+TROPI)  TSH  CARDIAC PANEL(CRET KIN+CKTOT+MB+TROPI)  HEMOGLOBIN A1C  CARDIAC PANEL(CRET KIN+CKTOT+MB+TROPI)  CBC   Dg Chest 2 View  03/04/2011  *RADIOLOGY REPORT*  Clinical Data: Shortness of breath  CHEST - 2 VIEW  Comparison: 02/25/2011  Findings: Coarse interstitial markings in the suprahilar regions and lung bases are stable.  No new infiltrate.  No effusion.  Heart size remains normal.  Regional bones unremarkable.  IMPRESSION:  Chronic parenchymal changes.  No acute disease.  Original Report Authenticated By: Thora Lance III, M.D.     1. Chest pain   2. COPD exacerbation       MDM  Patient was evaluated by myself. Based on evaluation patient was continued on breathing treatments. He was given a dose of Solu-Medrol. He had not taken his morning dose of prednisone. Patient also had workup for possible ACS performed given his history. He was given aspirin 3 and 24 mg by mouth. Chest x-ray showed no acute findings. EKG was unchanged. BNP and troponin were unremarkable as was CBC and renal panel. Patient complained of continued pain and was given 2 tabs of Percocet by mouth. Also he was given his home blood pressure medications of Norvasc and lisinopril. A three-hour marker will be performed at 10:32. Patient was reassessed after initial breathing treatment and had significant improvement. A second treatment was ordered. Patient will be intermittently assessed by respiratory therapy her wheeze protocol.  Patient had return of his workup including negative troponin. He continued to complain of chest pain despite treatment with oral pain medications as well as morphine. I spoke with the patient's care provider. He reported that the patient does not have a history of coronary artery disease. In reviewing the  patient's chart he was noted in January 30 admission note the patient had no history of coronary artery disease and that in February 7 admission note there was mention of a "old myocardial infarction". There are no cardiology notes or catheterization procedures reported. Care providers report the patient has never had this performed either. There are also no stress tests in the system currently. Patient remained hemodynamically stable but complained of chest pain. Patient will be admitted to [facility] for rule out and possible stress testing given that this has not been performed previously.       Cyndra Numbers, MD 03/04/11 1721

## 2011-03-04 NOTE — ED Notes (Signed)
Pt walked to gas station and called 911 for SOB. Said he didn't know he could call 911 from home phone. Out of medication for asthma and does not have meds for it; spitting up yellow mucous and thick cough.

## 2011-03-04 NOTE — Progress Notes (Addendum)
Arrival date & time 03/04/11      .  Shortness of Breath   (Consider location/radiation/quality/duration/timing/severity/associated sxs/prior  treatment)  HPI Comments:56 year old male with a history of COPD, MI, paranoid schizophrenia, and partial mental retardation who presents today by EMS for evaluation of shortness of breath. Patient reports that he has had increasing shortness of breath over the past 2 days. He had been started on prednisone by his primary care provider. Patient endorses 5/10 substernal chest pain. He cannot say whether it is like his prior heart attack or versus previous asthma and COPD exacerbations. Patient is wheezing upon arrival. He denies any fevers but does endorse cough. He has no known sick contacts. He denies any lower extremity swelling. Patient denies history of heart failure. He has not taken his home medications this morning. Symptoms are worse with exertion and better with rest. There are no other associated or modifying factors.      He states it feels like his past COPD exacerbations. He does complain of some sharp pain in the left side of his chest. This has been a recurrent complaint for him over the last 2 months. He says it feels similar to his last episodes.   He's had frequent visits recently for shortness of breath and chest pain. He was most recently seen on February 17 for chest pain he had negative cardiac enzymes and a negative CT scan for PE. He also had a negative CT scan for PE on December 3. He was also seen on February 16 for shortness of breath and COPD exacerbation. He was seen on February 14 for chest pain which was deemed to be likely noncardiac. He was also admitted on January 30 and discharged with COPD and chest pain most likely noncardiac. He states his chest pain has been on and off for last several months it's worse with movement worse with coughing and worse with breathing. Denies any pain or swelling in his extremities. he denies any  fevers he does complain of a cough which is productive of yellow sputum. He currently resides in a group home.   He was given a dose of Solu-Medrolin the ER . He had not taken his morning dose of prednisone. Patient also had workup for possible ACS performed given his history. He was given aspirin 3 and 24 mg by mouth. Chest x-ray showed no acute findings. EKG was unchanged. BNP and troponin were unremarkable as was CBC and renal panel. Patient complained of continued pain and was given 2 tabs of Percocet by mouth. Also he was given his home blood pressure medications of Norvasc and lisinopril.   Patient was reassessed after initial breathing treatment and had significant improvement.    Past Medical History   Diagnosis  Date   .  Myocardial infarct, old    .  Seizures    .  Psychiatric disorder    .  Schizoaffective disorder    .  Organic mood disorder    .  Intermittent explosive disorder    .  Generalized anxiety disorder    .  Mild mental retardation    .  COPD (chronic obstructive pulmonary disease)    .  Hypertension    .  Peptic ulcer    .  Thyroid disease    .  Prostate hyperplasia with urinary obstruction    .  Blind left eye    History reviewed. No pertinent past surgical history.  Family History   Problem  Relation  Age of  Onset   .  Hypertension      History   Substance Use Topics   .  Smoking status:  Current Everyday Smoker -- 0.2 packs/day     Types:  Cigarettes   .  Smokeless tobacco:  Never Used   .  Alcohol Use:  No     Review of Systems  Constitutional: Negative for fever, chills, diaphoresis and fatigue.  HENT: Positive for congestion. Negative for rhinorrhea and sneezing.  Eyes: Negative.  Respiratory: Positive for cough, shortness of breath and wheezing. Negative for chest tightness.  Cardiovascular: Positive for chest pain. Negative for leg swelling.  Gastrointestinal: Negative for nausea, vomiting, abdominal pain, diarrhea and blood in stool.    Genitourinary: Negative for frequency, hematuria, flank pain and difficulty urinating.  Musculoskeletal: Negative for back pain and arthralgias.  Skin: Negative for rash.  Neurological: Negative for dizziness, speech difficulty, weakness, numbness and headaches.  Allergies   Peanut-containing drug products; Coffee bean extract; Prolixin; Thorazine; and Lactose intolerance (gi)  Home Medications    Current Outpatient Rx   Name  Route  Sig  Dispense  Refill   .  IPRATROPIUM-ALBUTEROL 18-103 MCG/ACT IN AERO  Inhalation  Inhale 2 puffs into the lungs every 6 (six) hours as needed. Shortness of breath.     .  AMLODIPINE BESYLATE 10 MG PO TABS  Oral  Take 10 mg by mouth daily.     .  ASPIRIN 81 MG PO CHEW  Oral  Chew 324 mg by mouth once.     .  ASPIRIN EC 81 MG PO TBEC  Oral  Take 81 mg by mouth daily.     Marland Kitchen  DIPHENHYDRAMINE HCL 50 MG PO CAPS  Oral  Take 50 mg by mouth every 6 (six) hours as needed. For insomnia     .  DOCUSATE SODIUM 100 MG PO CAPS  Oral  Take 100 mg by mouth 2 (two) times daily.     Marland Kitchen  FLUTICASONE PROPIONATE HFA 44 MCG/ACT IN AERO  Inhalation  Inhale 2 puffs into the lungs 2 (two) times daily.     Marland Kitchen  HALOPERIDOL 2 MG PO TABS  Oral  Take 2 mg by mouth every 6 (six) hours as needed. agitation     .  HYDROCODONE-ACETAMINOPHEN 5-325 MG PO TABS  Oral  Take 1 tablet by mouth every 6 (six) hours as needed. pain     .  HYDROCODONE-ACETAMINOPHEN 5-325 MG PO TABS  Oral  Take 1 tablet by mouth every 4 (four) hours as needed for pain.  15 tablet  0   .  LEVETIRACETAM 1000 MG PO TABS  Oral  Take 1,000 mg by mouth every 12 (twelve) hours.     Marland Kitchen  LEVOTHYROXINE SODIUM 75 MCG PO TABS  Oral  Take 75 mcg by mouth daily.     Marland Kitchen  LISINOPRIL 5 MG PO TABS  Oral  Take 5 mg by mouth daily.     Marland Kitchen  LITHIUM CARBONATE 300 MG PO CAPS  Oral  Take 300 mg by mouth 3 (three) times daily with meals.     Marland Kitchen  LOPERAMIDE HCL 2 MG PO CAPS  Oral  Take 1 capsule (2 mg total) by mouth 4 (four) times daily as needed for  diarrhea or loose stools.  12 capsule  0   .  OMEPRAZOLE 20 MG PO CPDR  Oral  Take 20 mg by mouth 2 (two) times daily.     Marland Kitchen  POTASSIUM  CHLORIDE 20 MEQ PO PACK  Oral  Take 20 mEq by mouth daily.     Marland Kitchen  RISPERIDONE 1 MG PO TABS  Oral  Take 1 mg by mouth 2 (two) times daily.     Marland Kitchen  RISPERIDONE 2 MG PO TABS  Oral  Take 2 mg by mouth at bedtime.     .  TAMSULOSIN HCL 0.4 MG PO CAPS  Oral  Take 0.4 mg by mouth daily after supper.     .  TRAZODONE HCL 100 MG PO TABS  Oral  Take 100 mg by mouth at bedtime.       BP 133/79  Pulse 99  Temp(Src) 97.8 F (36.6 C) (Oral)  Resp 22  SpO2 95%  Physical Exam  Constitutional: He is oriented to person, place, and time. He appears well-developed and well-nourished.  HENT:  Head: Normocephalic and atraumatic.  Eyes: Pupils are equal, round, and reactive to light.  Neck: Normal range of motion. Neck supple.  Cardiovascular: Normal rate, regular rhythm and normal heart sounds.  Pulmonary/Chest: Effort normal. No respiratory distress. He has wheezes. He has no rales. He exhibits tenderness.  Patient has wheezing in all lung fields with some decreased breath sounds bilaterally. He is talking in full senses and has no accessory muscle use. He does have some tenderness on palpation of the left anterior chest wall.  Abdominal: Soft. Bowel sounds are normal. There is no tenderness. There is no rebound and no guarding.  Musculoskeletal: Normal range of motion. He exhibits no edema.  Lymphadenopathy:  He has no cervical adenopathy.  Neurological: He is alert and oriented to person, place, and time.  Skin: Skin is warm and dry. No rash noted.  Psychiatric: He has a normal mood and affect.     Date: 03/04/2011  Rate: 98  Rhythm: normal sinus rhythm  QRS Axis: normal  Intervals: normal  ST/T Wave abnormalities: nonspecific ST/T changes  Conduction Disutrbances:none  Narrative Interpretation: No acute ischemic changes compared to EKG from February 24 of  2013.  Old EKG Reviewed: unchanged   Results for orders placed during the hospital encounter of 03/04/11 (from the past 24 hour(s))  PRO B NATRIURETIC PEPTIDE     Status: Normal   Collection Time   03/04/11  7:15 AM      Component Value Range   Pro B Natriuretic peptide (BNP) 87.7  0 - 125 (pg/mL)  CBC     Status: Abnormal   Collection Time   03/04/11  7:15 AM      Component Value Range   WBC 7.0  4.0 - 10.5 (K/uL)   RBC 4.83  4.22 - 5.81 (MIL/uL)   Hemoglobin 12.6 (*) 13.0 - 17.0 (g/dL)   HCT 16.1  09.6 - 04.5 (%)   MCV 81.4  78.0 - 100.0 (fL)   MCH 26.1  26.0 - 34.0 (pg)   MCHC 32.1  30.0 - 36.0 (g/dL)   RDW 40.9  81.1 - 91.4 (%)   Platelets 282  150 - 400 (K/uL)  COMPREHENSIVE METABOLIC PANEL     Status: Abnormal   Collection Time   03/04/11  7:15 AM      Component Value Range   Sodium 141  135 - 145 (mEq/L)   Potassium 3.5  3.5 - 5.1 (mEq/L)   Chloride 103  96 - 112 (mEq/L)   CO2 29  19 - 32 (mEq/L)   Glucose, Bld 102 (*) 70 - 99 (mg/dL)   BUN 11  6 - 23 (mg/dL)   Creatinine, Ser 1.61  0.50 - 1.35 (mg/dL)   Calcium 09.6  8.4 - 10.5 (mg/dL)   Total Protein 7.3  6.0 - 8.3 (g/dL)   Albumin 3.8  3.5 - 5.2 (g/dL)   AST 15  0 - 37 (U/L)   ALT 11  0 - 53 (U/L)   Alkaline Phosphatase 72  39 - 117 (U/L)   Total Bilirubin 0.2 (*) 0.3 - 1.2 (mg/dL)   GFR calc non Af Amer >90  >90 (mL/min)   GFR calc Af Amer >90  >90 (mL/min)  POCT I-STAT TROPONIN I     Status: Normal   Collection Time   03/04/11  7:23 AM      Component Value Range   Troponin i, poc 0.00  0.00 - 0.08 (ng/mL)   Comment 3           POCT I-STAT TROPONIN I     Status: Normal   Collection Time   03/04/11 10:50 AM      Component Value Range   Troponin i, poc 0.00  0.00 - 0.08 (ng/mL)   Comment 3             Mosr recent CT scan on 17th shows  1. No evidence of pulmonary embolism.  2. Centrilobular emphysema with areas of scarring and probable  resolving infection (i.e. left upper lobe).  3. New spiculated 8 mm  right lower lobe superior segment lung  nodule. Given development since 12/04/2010, favored to be  infectious or inflammatory. Consider antibiotic therapy and follow-  up with chest CT of 3 months.  4. Cardiomegaly with advanced coronary artery atherosclerosis and  small bilateral pleural effusions.  5. Esophageal wall thickening is mild and suggests esophagitis.  6. Similar thoracic adenopathy. Likely reactive.  7. 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 Inferiorly, this  has a somewhat more mass-like appearance, which is similar to on  the prior. This can be reevaluated on follow-up chest CT.  ASSESSMENT AND PLAN  1) chest pain atypical, mu;tiple CT scans to r/o pe, cycle cardiac enzymes, 2 d echo d dimer, telemetry , consider stress test in am   2)Paranoid schizophrenia  Stable and at baseline. Recent  Psychiatry consult  for further management with his schizophrenia. Continue home meds  3)HTN (hypertension)  Continue ace and norvasc  Metoprolol in past has casused   bradycardia  4)Arm and leg pain : resolved. Unsure of the etiology of his arm and leg pain. He does have mild swelling in the left lower extremity dopplers negative for DVT during recent admission  .     4)COPD (chronic obstructive pulmonary disease)  Lungs sound clear.start  steroids and  nebulization's.  Tobacco cessation counselling  On nicotine patch.    5)Mental retardation  stable      6)Recent Seizure-like activity Question of seizure-like activity.recent  EEG was within normal limits. The activity was probably a manifestation of his schizophrenia.  Full code

## 2011-03-05 LAB — CARDIAC PANEL(CRET KIN+CKTOT+MB+TROPI)
CK, MB: 2.9 ng/mL (ref 0.3–4.0)
Total CK: 51 U/L (ref 7–232)

## 2011-03-05 LAB — CBC
HCT: 32.5 % — ABNORMAL LOW (ref 39.0–52.0)
Hemoglobin: 10.4 g/dL — ABNORMAL LOW (ref 13.0–17.0)
MCH: 26.1 pg (ref 26.0–34.0)
MCHC: 32 g/dL (ref 30.0–36.0)
MCV: 81.5 fL (ref 78.0–100.0)
Platelets: 233 K/uL (ref 150–400)
RBC: 3.99 MIL/uL — ABNORMAL LOW (ref 4.22–5.81)
RDW: 14 % (ref 11.5–15.5)
WBC: 8.1 K/uL (ref 4.0–10.5)

## 2011-03-05 LAB — LITHIUM LEVEL: Lithium Lvl: 0.41 meq/L — ABNORMAL LOW (ref 0.80–1.40)

## 2011-03-05 MED ORDER — MOXIFLOXACIN HCL 400 MG PO TABS
400.0000 mg | ORAL_TABLET | Freq: Every day | ORAL | Status: DC
  Administered 2011-03-05: 400 mg via ORAL
  Filled 2011-03-05: qty 1

## 2011-03-05 MED ORDER — GUAIFENESIN ER 600 MG PO TB12
600.0000 mg | ORAL_TABLET | Freq: Two times a day (BID) | ORAL | Status: DC
  Administered 2011-03-05: 600 mg via ORAL
  Filled 2011-03-05 (×2): qty 1

## 2011-03-05 MED ORDER — FUROSEMIDE 10 MG/ML IJ SOLN
40.0000 mg | Freq: Once | INTRAMUSCULAR | Status: AC
  Administered 2011-03-05: 40 mg via INTRAVENOUS
  Filled 2011-03-05: qty 4

## 2011-03-05 MED ORDER — MOXIFLOXACIN HCL 400 MG PO TABS: 400.0000 mg | ORAL_TABLET | Freq: Every day | ORAL | Status: DC

## 2011-03-05 MED ORDER — GUAIFENESIN ER 600 MG PO TB12: 600.0000 mg | ORAL_TABLET | Freq: Two times a day (BID) | ORAL | Status: DC

## 2011-03-05 MED ORDER — PREDNISONE 10 MG PO TABS: 5.0000 mg | ORAL_TABLET | Freq: Every day | ORAL | Status: DC

## 2011-03-05 NOTE — Discharge Instructions (Signed)
Patient has appt with PCP Jackalyn Lombard for 3/8 at 11 am  And after they see him they will make referral for outpt stress test.

## 2011-03-05 NOTE — Progress Notes (Addendum)
Patient ambulated x 1 assist in hallway on room air saturation 90-95%, patient back in room will monitor patient. patient tolerated well DR Susie Cassette made aware, ok to discharge patient.  [name], Randall An RN

## 2011-03-05 NOTE — Progress Notes (Signed)
Utilization review completed.  

## 2011-03-05 NOTE — Progress Notes (Signed)
Pt screaming, threw knife from his meal tray into the hallway- security called - by the time they arrived pt had calmed down

## 2011-03-05 NOTE — Progress Notes (Signed)
Security called again-due to pt extreme yelling in the hallway - k.kirby np

## 2011-03-05 NOTE — H&P (Signed)
[name] is avery pleasant gentleman who says he's cut down smoking from 1 ppd to 2 cigarettes per day. Congratulated him on cutting down. Discussed the effects of smoking on his breathing. Pt is in action stage and plans to quit completely after discharge. Advised pt to not buy any more cigarettes and to avoid the tobacco aisles where he usually purchases his cigarettes at. Pt verbalizes understanding and says he will comply. Referred to 1-800 quit now for f/u and support. Discussed oral fixation substitutes, second hand smoke and in home smoking policy. Reviewed and gave pt Written education/contact information.

## 2011-03-05 NOTE — H&P (Signed)
Arrival date & time 03/04/11      .  Shortness of Breath   (Consider location/radiation/quality/duration/timing/severity/associated sxs/prior  treatment)  HPI Comments:56-year-old male with a history of COPD, MI, paranoid schizophrenia, and partial mental retardation who presents today by EMS for evaluation of shortness of breath. Patient reports that he has had increasing shortness of breath over the past 2 days. He had been started on prednisone by his primary care provider. Patient endorses 5/10 substernal chest pain. He cannot say whether it is like his prior heart attack or versus previous asthma and COPD exacerbations. Patient is wheezing upon arrival. He denies any fevers but does endorse cough. He has no known sick contacts. He denies any lower extremity swelling. Patient denies history of heart failure. He has not taken his home medications this morning. Symptoms are worse with exertion and better with rest. There are no other associated or modifying factors.      He states it feels like his past COPD exacerbations. He does complain of some sharp pain in the left side of his chest. This has been a recurrent complaint for him over the last 2 months. He says it feels similar to his last episodes.   He's had frequent visits recently for shortness of breath and chest pain. He was most recently seen on February 17 for chest pain he had negative cardiac enzymes and a negative CT scan for PE. He also had a negative CT scan for PE on December 3. He was also seen on February 16 for shortness of breath and COPD exacerbation. He was seen on February 14 for chest pain which was deemed to be likely noncardiac. He was also admitted on January 30 and discharged with COPD and chest pain most likely noncardiac. He states his chest pain has been on and off for last several months it's worse with movement worse with coughing and worse with breathing. Denies any pain or swelling in his extremities. he denies any  fevers he does complain of a cough which is productive of yellow sputum. He currently resides in a group home.   He was given a dose of Solu-Medrolin the ER . He had not taken his morning dose of prednisone. Patient also had workup for possible ACS performed given his history. He was given aspirin 3 and 24 mg by mouth. Chest x-ray showed no acute findings. EKG was unchanged. BNP and troponin were unremarkable as was CBC and renal panel. Patient complained of continued pain and was given 2 tabs of Percocet by mouth. Also he was given his home blood pressure medications of Norvasc and lisinopril.   Patient was reassessed after initial breathing treatment and had significant improvement.    Past Medical History   Diagnosis  Date   .  Myocardial infarct, old    .  Seizures    .  Psychiatric disorder    .  Schizoaffective disorder    .  Organic mood disorder    .  Intermittent explosive disorder    .  Generalized anxiety disorder    .  Mild mental retardation    .  COPD (chronic obstructive pulmonary disease)    .  Hypertension    .  Peptic ulcer    .  Thyroid disease    .  Prostate hyperplasia with urinary obstruction    .  Blind left eye    History reviewed. No pertinent past surgical history.  Family History   Problem  Relation  Age of   Onset   .  Hypertension      History   Substance Use Topics   .  Smoking status:  Current Everyday Smoker -- 0.2 packs/day     Types:  Cigarettes   .  Smokeless tobacco:  Never Used   .  Alcohol Use:  No     Review of Systems  Constitutional: Negative for fever, chills, diaphoresis and fatigue.  HENT: Positive for congestion. Negative for rhinorrhea and sneezing.  Eyes: Negative.  Respiratory: Positive for cough, shortness of breath and wheezing. Negative for chest tightness.  Cardiovascular: Positive for chest pain. Negative for leg swelling.  Gastrointestinal: Negative for nausea, vomiting, abdominal pain, diarrhea and blood in stool.    Genitourinary: Negative for frequency, hematuria, flank pain and difficulty urinating.  Musculoskeletal: Negative for back pain and arthralgias.  Skin: Negative for rash.  Neurological: Negative for dizziness, speech difficulty, weakness, numbness and headaches.  Allergies   Peanut-containing drug products; Coffee bean extract; Prolixin; Thorazine; and Lactose intolerance (gi)  Home Medications    Current Outpatient Rx   Name  Route  Sig  Dispense  Refill   .  IPRATROPIUM-ALBUTEROL 18-103 MCG/ACT IN AERO  Inhalation  Inhale 2 puffs into the lungs every 6 (six) hours as needed. Shortness of breath.     .  AMLODIPINE BESYLATE 10 MG PO TABS  Oral  Take 10 mg by mouth daily.     .  ASPIRIN 81 MG PO CHEW  Oral  Chew 324 mg by mouth once.     .  ASPIRIN EC 81 MG PO TBEC  Oral  Take 81 mg by mouth daily.     .  DIPHENHYDRAMINE HCL 50 MG PO CAPS  Oral  Take 50 mg by mouth every 6 (six) hours as needed. For insomnia     .  DOCUSATE SODIUM 100 MG PO CAPS  Oral  Take 100 mg by mouth 2 (two) times daily.     .  FLUTICASONE PROPIONATE HFA 44 MCG/ACT IN AERO  Inhalation  Inhale 2 puffs into the lungs 2 (two) times daily.     .  HALOPERIDOL 2 MG PO TABS  Oral  Take 2 mg by mouth every 6 (six) hours as needed. agitation     .  HYDROCODONE-ACETAMINOPHEN 5-325 MG PO TABS  Oral  Take 1 tablet by mouth every 6 (six) hours as needed. pain     .  HYDROCODONE-ACETAMINOPHEN 5-325 MG PO TABS  Oral  Take 1 tablet by mouth every 4 (four) hours as needed for pain.  15 tablet  0   .  LEVETIRACETAM 1000 MG PO TABS  Oral  Take 1,000 mg by mouth every 12 (twelve) hours.     .  LEVOTHYROXINE SODIUM 75 MCG PO TABS  Oral  Take 75 mcg by mouth daily.     .  LISINOPRIL 5 MG PO TABS  Oral  Take 5 mg by mouth daily.     .  LITHIUM CARBONATE 300 MG PO CAPS  Oral  Take 300 mg by mouth 3 (three) times daily with meals.     .  LOPERAMIDE HCL 2 MG PO CAPS  Oral  Take 1 capsule (2 mg total) by mouth 4 (four) times daily as needed for  diarrhea or loose stools.  12 capsule  0   .  OMEPRAZOLE 20 MG PO CPDR  Oral  Take 20 mg by mouth 2 (two) times daily.     .  POTASSIUM   CHLORIDE 20 MEQ PO PACK  Oral  Take 20 mEq by mouth daily.     .  RISPERIDONE 1 MG PO TABS  Oral  Take 1 mg by mouth 2 (two) times daily.     .  RISPERIDONE 2 MG PO TABS  Oral  Take 2 mg by mouth at bedtime.     .  TAMSULOSIN HCL 0.4 MG PO CAPS  Oral  Take 0.4 mg by mouth daily after supper.     .  TRAZODONE HCL 100 MG PO TABS  Oral  Take 100 mg by mouth at bedtime.       BP 133/79  Pulse 99  Temp(Src) 97.8 F (36.6 C) (Oral)  Resp 22  SpO2 95%  Physical Exam  Constitutional: He is oriented to person, place, and time. He appears well-developed and well-nourished.  HENT:  Head: Normocephalic and atraumatic.  Eyes: Pupils are equal, round, and reactive to light.  Neck: Normal range of motion. Neck supple.  Cardiovascular: Normal rate, regular rhythm and normal heart sounds.  Pulmonary/Chest: Effort normal. No respiratory distress. He has wheezes. He has no rales. He exhibits tenderness.  Patient has wheezing in all lung fields with some decreased breath sounds bilaterally. He is talking in full senses and has no accessory muscle use. He does have some tenderness on palpation of the left anterior chest wall.  Abdominal: Soft. Bowel sounds are normal. There is no tenderness. There is no rebound and no guarding.  Musculoskeletal: Normal range of motion. He exhibits no edema.  Lymphadenopathy:  He has no cervical adenopathy.  Neurological: He is alert and oriented to person, place, and time.  Skin: Skin is warm and dry. No rash noted.  Psychiatric: He has a normal mood and affect.     Date: 03/04/2011  Rate: 98  Rhythm: normal sinus rhythm  QRS Axis: normal  Intervals: normal  ST/T Wave abnormalities: nonspecific ST/T changes  Conduction Disutrbances:none  Narrative Interpretation: No acute ischemic changes compared to EKG from February 24 of  2013.  Old EKG Reviewed: unchanged   Results for orders placed during the hospital encounter of 03/04/11 (from the past 24 hour(s))  PRO B NATRIURETIC PEPTIDE     Status: Normal   Collection Time   03/04/11  7:15 AM      Component Value Range   Pro B Natriuretic peptide (BNP) 87.7  0 - 125 (pg/mL)  CBC     Status: Abnormal   Collection Time   03/04/11  7:15 AM      Component Value Range   WBC 7.0  4.0 - 10.5 (K/uL)   RBC 4.83  4.22 - 5.81 (MIL/uL)   Hemoglobin 12.6 (*) 13.0 - 17.0 (g/dL)   HCT 39.3  39.0 - 52.0 (%)   MCV 81.4  78.0 - 100.0 (fL)   MCH 26.1  26.0 - 34.0 (pg)   MCHC 32.1  30.0 - 36.0 (g/dL)   RDW 13.9  11.5 - 15.5 (%)   Platelets 282  150 - 400 (K/uL)  COMPREHENSIVE METABOLIC PANEL     Status: Abnormal   Collection Time   03/04/11  7:15 AM      Component Value Range   Sodium 141  135 - 145 (mEq/L)   Potassium 3.5  3.5 - 5.1 (mEq/L)   Chloride 103  96 - 112 (mEq/L)   CO2 29  19 - 32 (mEq/L)   Glucose, Bld 102 (*) 70 - 99 (mg/dL)   BUN 11    6 - 23 (mg/dL)   Creatinine, Ser 0.87  0.50 - 1.35 (mg/dL)   Calcium 10.4  8.4 - 10.5 (mg/dL)   Total Protein 7.3  6.0 - 8.3 (g/dL)   Albumin 3.8  3.5 - 5.2 (g/dL)   AST 15  0 - 37 (U/L)   ALT 11  0 - 53 (U/L)   Alkaline Phosphatase 72  39 - 117 (U/L)   Total Bilirubin 0.2 (*) 0.3 - 1.2 (mg/dL)   GFR calc non Af Amer >90  >90 (mL/min)   GFR calc Af Amer >90  >90 (mL/min)  POCT I-STAT TROPONIN I     Status: Normal   Collection Time   03/04/11  7:23 AM      Component Value Range   Troponin i, poc 0.00  0.00 - 0.08 (ng/mL)   Comment 3           POCT I-STAT TROPONIN I     Status: Normal   Collection Time   03/04/11 10:50 AM      Component Value Range   Troponin i, poc 0.00  0.00 - 0.08 (ng/mL)   Comment 3             Mosr recent CT scan on 17th shows  1. No evidence of pulmonary embolism.  2. Centrilobular emphysema with areas of scarring and probable  resolving infection (i.e. left upper lobe).  3. New spiculated 8 mm  right lower lobe superior segment lung  nodule. Given development since 12/04/2010, favored to be  infectious or inflammatory. Consider antibiotic therapy and follow-  up with chest CT of 3 months.  4. Cardiomegaly with advanced coronary artery atherosclerosis and  small bilateral pleural effusions.  5. Esophageal wall thickening is mild and suggests esophagitis.  6. Similar thoracic adenopathy. Likely reactive.  7. 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 Inferiorly, this  has a somewhat more mass-like appearance, which is similar to on  the prior. This can be reevaluated on follow-up chest CT.  ASSESSMENT AND PLAN  1) chest pain atypical, mu;tiple CT scans to r/o pe, cycle cardiac enzymes, 2 d echo d dimer, telemetry , consider stress test in am   2)Paranoid schizophrenia  Stable and at baseline. Recent  Psychiatry consult  for further management with his schizophrenia. Continue home meds  3)HTN (hypertension)  Continue ace and norvasc  Metoprolol in past has casused   bradycardia  4)Arm and leg pain : resolved. Unsure of the etiology of his arm and leg pain. He does have mild swelling in the left lower extremity dopplers negative for DVT during recent admission  .     4)COPD (chronic obstructive pulmonary disease)  Lungs sound clear.start  steroids and  nebulization's.  Tobacco cessation counselling  On nicotine patch.    5)Mental retardation  stable      6)Recent Seizure-like activity Question of seizure-like activity.recent  EEG was within normal limits. The activity was probably a manifestation of his schizophrenia.  Full code  

## 2011-03-05 NOTE — Progress Notes (Signed)
  Echocardiogram 2D Echocardiogram has been performed.  [name] 03/05/2011, 9:54 AM

## 2011-03-05 NOTE — Progress Notes (Signed)
Anticipate pt return to [facility] group home today via group home transport. Ryan, group home manager, is to provide transport around 8:00pm and his contact # is [phone]. Pt's d/c summ and FL2 to be given to RN. No other CSW needs CSW signing off. Dellie Burns, MSW, LCSWA [phone] (cover)

## 2011-03-05 NOTE — Progress Notes (Signed)
   CARE MANAGEMENT NOTE 03/05/2011  Patient:  TESEAN, STUMP   Account Number:  [id number]  Date Initiated:  03/05/2011  Documentation initiated by:  Donn Pierini  Subjective/Objective Assessment:   Pt admitted with chest pain/ COPD  patient from Group Home([facility])     Action/Plan:   Anticipated DC Date:  03/06/2011   Anticipated DC Plan:  HOME/SELF CARE  In-house referral  Clinical Social Worker      DC Planning Services  CM consult      Choice offered to / List presented to:             Status of service:  In process, will continue to follow Medicare Important Message given?   (If response is "NO", the following Medicare IM given date fields will be blank) Date Medicare IM given:   Date Additional Medicare IM given:    Discharge Disposition:    Per UR Regulation:    Comments:  PCP Jackalyn Lombard- appt is for 3/8 at 11 for f/u appt.  03/05/11 12:43 Letha Cape RN, BSN [phone] Patient is from [facility].  Patient 's care giver is Nonnie Done [phone].  I spoke with Alycia Rossetti and he states patient's PCP is Dr. Jackalyn Lombard at [facility] in [place].

## 2011-03-05 NOTE — Discharge Summary (Signed)
Physician Discharge Summary  [name] MRN: [id number] DOB/AGE: 07-Sep-1955 56 y.o.  PCP: No primary provider on file.   Admit date: 03/04/2011 Discharge date: 03/05/2011  Discharge Diagnoses:     COPD with acute exacerbation  Chest pain  Schizoaffective disorder  Mental retardation, mild (I.Q. 50-70)  COPD (chronic obstructive pulmonary disease)  Tobacco abuse  Hypothyroidism  BPH (benign prostatic hyperplasia)  Arm and leg pain  Seizure-like activity  Paranoid schizophrenia  COPD (chronic obstructive pulmonary disease)  Tobacco use   Medication List  As of 03/05/2011  7:51 AM   TAKE these medications         albuterol-ipratropium 18-103 MCG/ACT inhaler   Commonly known as: COMBIVENT   Inhale 2 puffs into the lungs every 6 (six) hours as needed. Shortness of breath.      amLODipine 10 MG tablet   Commonly known as: NORVASC   Take 10 mg by mouth daily.      aspirin 81 MG chewable tablet   Chew 324 mg by mouth once.      aspirin EC 81 MG tablet   Take 81 mg by mouth daily.      diphenhydrAMINE 50 MG capsule   Commonly known as: BENADRYL   Take 50 mg by mouth every 6 (six) hours as needed. For insomnia      docusate sodium 100 MG capsule   Commonly known as: COLACE   Take 100 mg by mouth 2 (two) times daily.      fluticasone 44 MCG/ACT inhaler   Commonly known as: FLOVENT HFA   Inhale 2 puffs into the lungs 2 (two) times daily.      haloperidol 2 MG tablet   Commonly known as: HALDOL   Take 2 mg by mouth every 6 (six) hours as needed. agitation      levETIRAcetam 1000 MG tablet   Commonly known as: KEPPRA   Take 1,000 mg by mouth every 12 (twelve) hours.      levothyroxine 75 MCG tablet   Commonly known as: SYNTHROID, LEVOTHROID   Take 75 mcg by mouth daily.      lisinopril 5 MG tablet   Commonly known as: PRINIVIL,ZESTRIL   Take 5 mg by mouth daily.      lithium carbonate 300 MG capsule   Take 300 mg by mouth 3 (three) times daily with meals.       omeprazole 20 MG capsule   Commonly known as: PRILOSEC   Take 20 mg by mouth 2 (two) times daily.      potassium chloride 20 MEQ packet   Commonly known as: KLOR-CON   Take 20 mEq by mouth daily.      predniSONE 10 MG tablet   Commonly known as: DELTASONE   Take 0.5 tablets (5 mg total) by mouth daily.      risperiDONE 2 MG tablet   Commonly known as: RISPERDAL   Take 2 mg by mouth at bedtime.      risperiDONE 1 MG tablet   Commonly known as: RISPERDAL   Take 1 mg by mouth 2 (two) times daily.      Tamsulosin HCl 0.4 MG Caps   Commonly known as: FLOMAX   Take 0.4 mg by mouth daily after supper.      traZODone 100 MG tablet   Commonly known as: DESYREL   Take 100 mg by mouth at bedtime.            Discharge Condition: Stable  Disposition: 01-Home or Self Care   Consults: None  Significant Diagnostic Studies: Dg Chest 1 View  02/17/2011  *RADIOLOGY REPORT*  Clinical Data: Shortness of breath.  CHEST - 1 VIEW  Comparison: 02/15/2011, 12/04/2010 and 10/06/2010 chestx-ray. 12/04/2010 CT.  Findings: Pulmonary vascular congestion.  No pneumothorax.  Lung parenchymal changes more notable on the left. This may represent sequelae of prior infection superimposed upon chronic changes.  This patient had adenopathy on the prior chest CT, and follow-up CT imaging at such time was recommended in 3 - 6 months. When this follow-up CT is obtained than the lung parenchymal changes can be readdressed to exclude malignancy.  When compared to the most recent chest x-ray examination there has been no significant change.  IMPRESSION: No significant change since recent chest x-ray.  Follow up as discussed above.  Original Report Authenticated By: Fuller Canada, M.D.   Dg Chest 2 View  03/04/2011  *RADIOLOGY REPORT*  Clinical Data: Shortness of breath  CHEST - 2 VIEW  Comparison: 02/25/2011  Findings: Coarse interstitial markings in the suprahilar regions and lung bases are stable.  No new  infiltrate.  No effusion.  Heart size remains normal.  Regional bones unremarkable.  IMPRESSION:  Chronic parenchymal changes.  No acute disease.  Original Report Authenticated By: Osa Craver, M.D.   Dg Chest 2 View  02/15/2011  *RADIOLOGY REPORT*  Clinical Data: Chest pain and shortness of breath.  CHEST - 2 VIEW  Comparison: 01/31/2011  Findings: Shallow inspiration.  Normal heart size and pulmonary vascularity.  Patchy interstitial changes in the lungs similar to previous study.  No developing airspace consolidation.  No blunting of costophrenic angles.  No pneumothorax.  Diffuse emphysematous changes.  Mild prominence of central pulmonary vascularity may represent pulmonary arterial hypertension.  Old right rib fracture. Degenerative changes in the thoracic spine and shoulders.  IMPRESSION: Stable patchy nodular areas of fibrosis in the lungs. Emphysematous changes.  Prominent central pulmonary vascularity suggesting pulmonary hypertension.  No active consolidation.  Original Report Authenticated By: Marlon Pel, M.D.   Ct Head Wo Contrast  02/17/2011  *RADIOLOGY REPORT*  Clinical Data: Fall and hit head.  Left-sided numbness.  CT HEAD WITHOUT CONTRAST  Technique:  Contiguous axial images were obtained from the base of the skull through the vertex without contrast.  Comparison: 12/03/2003  Findings: There is motion artifact at the cerebellum despite obtaining additional images.  No evidence for acute hemorrhage, mass lesion, midline shift, hydrocephalus or large infarct.  There is diffuse mucosal disease in the maxillary sinuses and ethmoid air cells.  Visualized mastoid air cells are clear.  IMPRESSION: No acute intracranial abnormality.  Limited evaluation at the skull base and cerebellum due to motion artifact.  Paranasal sinus disease.  Original Report Authenticated By: Richarda Overlie, M.D.   Ct Head Wo Contrast  02/08/2011  *RADIOLOGY REPORT*  Clinical Data: Right facial droop,  left-sided weakness  CT HEAD WITHOUT CONTRAST  Technique:  Contiguous axial images were obtained from the base of the skull through the vertex without contrast.  Comparison: None available  Findings: Mild atrophy. There is no evidence of acute intracranial hemorrhage, brain edema, mass lesion, acute infarction,   mass effect, or midline shift. Acute infarct may be inapparent on noncontrast CT.  No other intra-axial abnormalities are seen, and the ventricles and sulci are within normal limits in size and symmetry.   No abnormal extra-axial fluid collections or masses are identified.  No significant calvarial abnormality.  IMPRESSION:  1. Negative for bleed or other acute intracranial process.  Original Report Authenticated By: Osa Craver, M.D.   Ct Angio Chest W/cm &/or Wo Cm  02/18/2011  *RADIOLOGY REPORT*  Clinical Data: Chest pain for few days.  History of seizures. Schizophrenia.  Mental retardation.  COPD.  Hypertension. .  CT ANGIOGRAPHY CHEST  Technique:  Multidetector CT imaging of the chest using the standard protocol during bolus administration of intravenous contrast. Multiplanar reconstructed images including MIPs were obtained and reviewed to evaluate the vascular anatomy.  Contrast: 80mL OMNIPAQUE IOHEXOL 350 MG/ML IV SOLN  Comparison: Plain film of 02/17/2011.  CT of 12/04/2010.  Findings: Lung windows demonstrate secretions within the dependent trachea.  Nonspecific bronchial wall thickening, primarily to the lower lobes.  Moderate centrilobular emphysema with biapical pleural parenchymal scarring.  A subpleural 4 mm right middle lobe nodule on image 98 was not present on the prior.  Calcified granuloma more inferiorly.  Scarring in the lung bases bilaterally.  Ill-defined defined nodular opacity in the superior segment right lower lobe measures 8 mm on image 69.  This was is not present on the prior exam.  Mildly spiculated and contacts the right major fissure.  Similar probable scarring  in the posterior left upper lobe, image 35 - 50 of series 6.  An area of subpleural left upper lobe interstitial opacity, including on image 61 is slightly improved and favored to represent an area of decreased infection.  Soft tissue windows:  The quality of this exam for evaluation of pulmonary embolism is good. No evidence of pulmonary embolism.  Bilateral gynecomastia.  Small stable left axillary nodes.  Normal aortic caliber without dissection.  Mild cardiomegaly with multivessel coronary artery atherosclerosis.  Trace bilateral pleural effusions.  Similar increased number of small mediastinal nodes.  Right hilar adenopathy 1.6 cm, similar to on the prior (when remeasured).  Left infrahilar nodal tissue measures up to 1.2 cm and is similar. The esophagus is mildly thick-walled.  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 More well-defined soft tissue density within the inferior aspect prevascular space.  This is contiguous with the thymus, measures 1.8 x 3.3 cm on image 75.  Similar to on the prior exam.  Limited abdominal imaging demonstrates no significant findings. Nonacute bilateral rib fractures.  Mild accentuation of expected thoracic kyphosis.  IMPRESSION:  1. No evidence of pulmonary embolism. 2.  Centrilobular emphysema with areas of scarring and probable resolving infection (i.e. left upper lobe). 3.  New spiculated 8 mm right lower lobe superior segment lung nodule.  Given development since 12/04/2010, favored to be infectious or inflammatory.  Consider antibiotic therapy and follow- up with chest CT of 3 months. 4.  Cardiomegaly with advanced coronary artery atherosclerosis and small bilateral pleural effusions. 5.  Esophageal wall thickening is mild and suggests esophagitis. 6.  Similar thoracic adenopathy.  Likely reactive. 7.  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  Inferiorly, this has a somewhat more mass-like appearance, which is similar to on the prior.  This can be reevaluated  on follow-up chest CT.  Original Report Authenticated By: Consuello Bossier, M.D.   Dg Chest Port 1 View  02/25/2011  *RADIOLOGY REPORT*  Clinical Data: Shortness of breath, wheezing.  PORTABLE CHEST - 1 VIEW  Comparison: 02/17/2011  Findings: Hyperinflation of the lungs.  Heart is normal size. Chronic changes in the lungs.  No effusions.  No acute bony abnormality.  Evidence of old injury in the proximal left humerus.  IMPRESSION: Hyperinflation/chronic  changes.  Original Report Authenticated By: Cyndie Chime, M.D.   Dg Chest Port 1 View  02/08/2011  *RADIOLOGY REPORT*  Clinical Data: 56 year old male with left-sided weakness.  PORTABLE CHEST - 1 VIEW  Comparison: None.  Findings: Portable semi upright AP view at 2125 hours.  Slightly shallow lung volumes.  Cardiac size and mediastinal contours are within normal limits.  No pneumothorax, pulmonary edema, pleural effusion or consolidation.  Mild retrocardiac opacity most resembles atelectasis. No acute osseous abnormality identified.  IMPRESSION: Mild retrocardiac atelectasis, no other acute cardiopulmonary abnormality.  Original Report Authenticated By: Harley Hallmark, M.D.      Microbiology: No results found for this or any previous visit (from the past 240 hour(s)).   Labs: Results for orders placed during the hospital encounter of 03/04/11 (from the past 48 hour(s))  PRO B NATRIURETIC PEPTIDE     Status: Normal   Collection Time   03/04/11  7:15 AM      Component Value Range Comment   Pro B Natriuretic peptide (BNP) 87.7  0 - 125 (pg/mL)   CBC     Status: Abnormal   Collection Time   03/04/11  7:15 AM      Component Value Range Comment   WBC 7.0  4.0 - 10.5 (K/uL)    RBC 4.83  4.22 - 5.81 (MIL/uL)    Hemoglobin 12.6 (*) 13.0 - 17.0 (g/dL)    HCT 54.0  98.1 - 19.1 (%)    MCV 81.4  78.0 - 100.0 (fL)    MCH 26.1  26.0 - 34.0 (pg)    MCHC 32.1  30.0 - 36.0 (g/dL)    RDW 47.8  29.5 - 62.1 (%)    Platelets 282  150 - 400 (K/uL)    COMPREHENSIVE METABOLIC PANEL     Status: Abnormal   Collection Time   03/04/11  7:15 AM      Component Value Range Comment   Sodium 141  135 - 145 (mEq/L)    Potassium 3.5  3.5 - 5.1 (mEq/L)    Chloride 103  96 - 112 (mEq/L)    CO2 29  19 - 32 (mEq/L)    Glucose, Bld 102 (*) 70 - 99 (mg/dL)    BUN 11  6 - 23 (mg/dL)    Creatinine, Ser 3.08  0.50 - 1.35 (mg/dL)    Calcium 65.7  8.4 - 10.5 (mg/dL)    Total Protein 7.3  6.0 - 8.3 (g/dL)    Albumin 3.8  3.5 - 5.2 (g/dL)    AST 15  0 - 37 (U/L) HEMOLYSIS AT THIS LEVEL MAY AFFECT RESULT   ALT 11  0 - 53 (U/L)    Alkaline Phosphatase 72  39 - 117 (U/L)    Total Bilirubin 0.2 (*) 0.3 - 1.2 (mg/dL)    GFR calc non Af Amer >90  >90 (mL/min)    GFR calc Af Amer >90  >90 (mL/min)   POCT I-STAT TROPONIN I     Status: Normal   Collection Time   03/04/11  7:23 AM      Component Value Range Comment   Troponin i, poc 0.00  0.00 - 0.08 (ng/mL)    Comment 3            POCT I-STAT TROPONIN I     Status: Normal   Collection Time   03/04/11 10:50 AM      Component Value Range Comment   Troponin i, poc 0.00  0.00 -  0.08 (ng/mL)    Comment 3            D-DIMER, QUANTITATIVE     Status: Normal   Collection Time   03/04/11 12:46 PM      Component Value Range Comment   D-Dimer, Quant 0.45  0.00 - 0.48 (ug/mL-FEU)   COMPREHENSIVE METABOLIC PANEL     Status: Abnormal   Collection Time   03/04/11  1:10 PM      Component Value Range Comment   Sodium 140  135 - 145 (mEq/L)    Potassium 4.1  3.5 - 5.1 (mEq/L)    Chloride 103  96 - 112 (mEq/L)    CO2 28  19 - 32 (mEq/L)    Glucose, Bld 160 (*) 70 - 99 (mg/dL)    BUN 12  6 - 23 (mg/dL)    Creatinine, Ser 1.61  0.50 - 1.35 (mg/dL)    Calcium 09.6  8.4 - 10.5 (mg/dL)    Total Protein 7.1  6.0 - 8.3 (g/dL)    Albumin 3.6  3.5 - 5.2 (g/dL)    AST 12  0 - 37 (U/L)    ALT 11  0 - 53 (U/L)    Alkaline Phosphatase 71  39 - 117 (U/L)    Total Bilirubin 0.2 (*) 0.3 - 1.2 (mg/dL)    GFR calc non Af Amer 89 (*) >90  (mL/min)    GFR calc Af Amer >90  >90 (mL/min)   TSH     Status: Normal   Collection Time   03/04/11  1:10 PM      Component Value Range Comment   TSH 1.553  0.350 - 4.500 (uIU/mL)   CARDIAC PANEL(CRET KIN+CKTOT+MB+TROPI)     Status: Normal   Collection Time   03/04/11  1:10 PM      Component Value Range Comment   Total CK 73  7 - 232 (U/L)    CK, MB 3.4  0.3 - 4.0 (ng/mL)    Troponin I <0.30  <0.30 (ng/mL)    Relative Index RELATIVE INDEX IS INVALID  0.0 - 2.5    HEMOGLOBIN A1C     Status: Normal   Collection Time   03/04/11  1:10 PM      Component Value Range Comment   Hemoglobin A1C 5.4  <5.7 (%)    Mean Plasma Glucose 108  <117 (mg/dL)   CARDIAC PANEL(CRET KIN+CKTOT+MB+TROPI)     Status: Normal   Collection Time   03/04/11  8:53 PM      Component Value Range Comment   Total CK 54  7 - 232 (U/L)    CK, MB 2.7  0.3 - 4.0 (ng/mL)    Troponin I <0.30  <0.30 (ng/mL)    Relative Index RELATIVE INDEX IS INVALID  0.0 - 2.5    CARDIAC PANEL(CRET KIN+CKTOT+MB+TROPI)     Status: Normal   Collection Time   03/05/11  4:40 AM      Component Value Range Comment   Total CK 51  7 - 232 (U/L)    CK, MB 2.9  0.3 - 4.0 (ng/mL)    Troponin I <0.30  <0.30 (ng/mL)    Relative Index RELATIVE INDEX IS INVALID  0.0 - 2.5    CBC     Status: Abnormal   Collection Time   03/05/11  4:40 AM      Component Value Range Comment   WBC 8.1  4.0 - 10.5 (K/uL)    RBC 3.99 (*)  4.22 - 5.81 (MIL/uL)    Hemoglobin 10.4 (*) 13.0 - 17.0 (g/dL)    HCT 16.1 (*) 09.6 - 52.0 (%)    MCV 81.5  78.0 - 100.0 (fL)    MCH 26.1  26.0 - 34.0 (pg)    MCHC 32.0  30.0 - 36.0 (g/dL)    RDW 04.5  40.9 - 81.1 (%)    Platelets 233  150 - 400 (K/uL)      HPI :56 year old male with a history of COPD, MI, paranoid schizophrenia, and partial mental retardation who presents today by EMS for evaluation of shortness of breath. Patient reports that he has had increasing shortness of breath over the past 2 days. He had been started on  prednisone by his primary care provider. Patient endorses 5/10 substernal chest pain. He cannot say whether it is like his prior heart attack or versus previous asthma and COPD exacerbations. Patient is wheezing upon arrival. He denies any fevers but does endorse cough. He has no known sick contacts. He denies any lower extremity swelling. Patient denies history of heart failure. . Symptoms are worse with exertion and better with rest. There are no other associated or modifying factors.  HOSPITAL COURSE:   #1 chest pain cardiac enzymes were found to be negative, d-dimer was negative, the patient remained in normal sinus rhythm. A 2-D echo is pending. Chest pain is likely noncardiac. The patient has been recommended to continue with the baby aspirin. Smoking cessation counseling has been done. #2 COPD exacerbation patient will continue with a prednisone taper, has fluticasone, Advair, Combivent. #3 schizophrenia remained at baseline without any exacerbation. Lithium level is pending #4 disposition patient can be discharged after his 2-D echo is completed   Discharge Exam:  Blood pressure 133/87, pulse 65, temperature 97 F (36.1 C), temperature source Oral, resp. rate 20, height 6\' 1"  (1.854 m), weight 72 kg (158 lb 11.7 oz), SpO2 94.00%.  Constitutional: He is oriented to person, place, and time. He appears well-developed and well-nourished.  HENT:  Head: Normocephalic and atraumatic.  Eyes: Pupils are equal, round, and reactive to light.  Neck: Normal range of motion. Neck supple.  Cardiovascular: Normal rate, regular rhythm and normal heart sounds.  Pulmonary/Chest: Effort normal. No respiratory distress. He has wheezes. He has no rales. He exhibits tenderness.  Patient has wheezing in all lung fields with some decreased breath sounds bilaterally. He is talking in full senses and has no accessory muscle use. He does have some tenderness on palpation of the left anterior chest wall.   Abdominal: Soft. Bowel sounds are normal. There is no tenderness. There is no rebound and no guarding.  Musculoskeletal: Normal range of motion. He exhibits no edema.  Lymphadenopathy:  He has no cervical adenopathy.  Neurological: He is alert and oriented to person, place, and time.  Skin: Skin is warm and dry. No rash noted.  Psychiatric: He has a normal mood and affect.     Discharge Orders    Future Orders Please Complete By Expires   Diet - low sodium heart healthy      Increase activity slowly      Call MD for:  temperature >100.4      Call MD for:  persistant nausea and vomiting      Call MD for:  severe uncontrolled pain      Check Pulse Oximetry while ambulating         Follow-up Information    Follow up with PCP in 1  week. (Posthospital followup)          Signed: Richarda Overlie 03/05/2011, 7:51 AM

## 2011-03-06 NOTE — Progress Notes (Signed)
   CARE MANAGEMENT NOTE 03/06/2011  Patient:  SHALON, COUNCILMAN   Account Number:  [id number]  Date Initiated:  03/05/2011  Documentation initiated by:  Donn Pierini  Subjective/Objective Assessment:   Pt admitted with chest pain/ COPD  patient from Group Home([facility])     Action/Plan:   Anticipated DC Date:  03/05/2011   Anticipated DC Plan:  GROUP HOME  In-house referral  Clinical Social Worker      DC Planning Services  CM consult      Choice offered to / List presented to:             Status of service:  Completed, signed off Medicare Important Message given?   (If response is "NO", the following Medicare IM given date fields will be blank) Date Medicare IM given:   Date Additional Medicare IM given:    Discharge Disposition:  GROUP HOME  Per UR Regulation:    Comments:  PCP Jackalyn Lombard- appt is for 3/8 at 11 for f/u appt.  03/05/11 12:43 Letha Cape RN, BSN [phone] Patient is from [facility], Group Home.  Patient 's care giver is Nonnie Done [phone].  I spoke with Alycia Rossetti and he states patient's PCP is Dr. Jackalyn Lombard at [facility] in [place].

## 2011-03-10 ENCOUNTER — Inpatient Hospital Stay (HOSPITAL_COMMUNITY)
Admission: EM | Admit: 2011-03-10 | Discharge: 2011-03-17 | DRG: 191 | Disposition: A | Discharge status: Home / Self Care | Payer: Medicare Other | Attending: Internal Medicine | Admitting: Internal Medicine

## 2011-03-10 ENCOUNTER — Encounter (HOSPITAL_COMMUNITY): Payer: Self-pay | Admitting: Emergency Medicine

## 2011-03-10 ENCOUNTER — Emergency Department (HOSPITAL_COMMUNITY): Payer: Medicare Other

## 2011-03-10 ENCOUNTER — Other Ambulatory Visit: Payer: Self-pay

## 2011-03-10 DIAGNOSIS — F39 Unspecified mood [affective] disorder: Secondary | ICD-10-CM | POA: Diagnosis present

## 2011-03-10 DIAGNOSIS — F259 Schizoaffective disorder, unspecified: Secondary | ICD-10-CM | POA: Diagnosis present

## 2011-03-10 DIAGNOSIS — I10 Essential (primary) hypertension: Secondary | ICD-10-CM | POA: Diagnosis present

## 2011-03-10 DIAGNOSIS — F70 Mild intellectual disabilities: Secondary | ICD-10-CM | POA: Diagnosis present

## 2011-03-10 DIAGNOSIS — F411 Generalized anxiety disorder: Secondary | ICD-10-CM | POA: Diagnosis present

## 2011-03-10 DIAGNOSIS — F17200 Nicotine dependence, unspecified, uncomplicated: Secondary | ICD-10-CM | POA: Diagnosis present

## 2011-03-10 DIAGNOSIS — N401 Enlarged prostate with lower urinary tract symptoms: Secondary | ICD-10-CM | POA: Diagnosis present

## 2011-03-10 DIAGNOSIS — T380X5A Adverse effect of glucocorticoids and synthetic analogues, initial encounter: Secondary | ICD-10-CM | POA: Diagnosis not present

## 2011-03-10 DIAGNOSIS — H5440 Blindness, one eye, unspecified eye: Secondary | ICD-10-CM | POA: Diagnosis present

## 2011-03-10 DIAGNOSIS — E039 Hypothyroidism, unspecified: Secondary | ICD-10-CM | POA: Diagnosis present

## 2011-03-10 DIAGNOSIS — N138 Other obstructive and reflux uropathy: Secondary | ICD-10-CM | POA: Diagnosis present

## 2011-03-10 DIAGNOSIS — G40109 Localization-related (focal) (partial) symptomatic epilepsy and epileptic syndromes with simple partial seizures, not intractable, without status epilepticus: Secondary | ICD-10-CM | POA: Diagnosis present

## 2011-03-10 DIAGNOSIS — R569 Unspecified convulsions: Secondary | ICD-10-CM | POA: Diagnosis present

## 2011-03-10 DIAGNOSIS — D72829 Elevated white blood cell count, unspecified: Secondary | ICD-10-CM | POA: Diagnosis not present

## 2011-03-10 DIAGNOSIS — Z79899 Other long term (current) drug therapy: Secondary | ICD-10-CM

## 2011-03-10 DIAGNOSIS — J441 Chronic obstructive pulmonary disease with (acute) exacerbation: Principal | ICD-10-CM | POA: Diagnosis present

## 2011-03-10 DIAGNOSIS — F6381 Intermittent explosive disorder: Secondary | ICD-10-CM | POA: Diagnosis present

## 2011-03-10 DIAGNOSIS — Z720 Tobacco use: Secondary | ICD-10-CM | POA: Diagnosis present

## 2011-03-10 LAB — MAGNESIUM: Magnesium: 2.1 mg/dL (ref 1.5–2.5)

## 2011-03-10 LAB — CREATININE, SERUM
Creatinine, Ser: 0.94 mg/dL (ref 0.50–1.35)
GFR calc Af Amer: >90 mL/min (ref >90)
GFR calc non Af Amer: >90 mL/min (ref >90)

## 2011-03-10 LAB — DIFFERENTIAL
Basophils Absolute: 0.1 10*3/uL (ref 0.0–0.1)
Lymphocytes Relative: 16 % (ref 12–46)
Monocytes Absolute: 0.9 10*3/uL (ref 0.1–1.0)
Monocytes Relative: 10 % (ref 3–12)
Neutro Abs: 5.6 10*3/uL (ref 1.7–7.7)
Neutrophils Relative %: 61 % (ref 43–77)

## 2011-03-10 LAB — CARDIAC PANEL(CRET KIN+CKTOT+MB+TROPI)
CK, MB: 2.9 ng/mL (ref 0.3–4.0)
CK, MB: 2.8 ng/mL (ref 0.3–4.0)
Relative Index: INVALID (ref 0.0–2.5)
Relative Index: INVALID (ref 0.0–2.5)
Troponin I: <0.3 ng/mL (ref <0.30)
Troponin I: <0.3 ng/mL (ref <0.30)

## 2011-03-10 LAB — CBC
HCT: 40 % (ref 39.0–52.0)
HCT: 36.4 % — ABNORMAL LOW (ref 39.0–52.0)
Hemoglobin: 12.8 g/dL — ABNORMAL LOW (ref 13.0–17.0)
Hemoglobin: 11.5 g/dL — ABNORMAL LOW (ref 13.0–17.0)
MCHC: 31.6 g/dL (ref 30.0–36.0)
RDW: 14.5 % (ref 11.5–15.5)
WBC: 9.1 10*3/uL (ref 4.0–10.5)
WBC: 9.6 10*3/uL (ref 4.0–10.5)

## 2011-03-10 LAB — COMPREHENSIVE METABOLIC PANEL
ALT: 11 U/L (ref 0–53)
AST: 15 U/L (ref 0–37)
Alkaline Phosphatase: 72 U/L (ref 39–117)
CO2: 28 mEq/L (ref 19–32)
Chloride: 106 mEq/L (ref 96–112)
Creatinine, Ser: 0.84 mg/dL (ref 0.50–1.35)
GFR calc non Af Amer: >90 mL/min (ref >90)
Potassium: 4.5 mEq/L (ref 3.5–5.1)
Total Bilirubin: 0.4 mg/dL (ref 0.3–1.2)

## 2011-03-10 MED ORDER — SODIUM CHLORIDE 0.9 % IJ SOLN: 3.0000 mL | INTRAMUSCULAR | Status: DC | PRN

## 2011-03-10 MED ORDER — PANTOPRAZOLE SODIUM 40 MG PO TBEC
40.0000 mg | DELAYED_RELEASE_TABLET | Freq: Every day | ORAL | Status: DC
  Administered 2011-03-11 – 2011-03-16 (×5): 40 mg via ORAL
  Filled 2011-03-10 (×6): qty 1

## 2011-03-10 MED ORDER — DOCUSATE SODIUM 100 MG PO CAPS
100.0000 mg | ORAL_CAPSULE | Freq: Two times a day (BID) | ORAL | Status: DC
  Administered 2011-03-10 – 2011-03-16 (×11): 100 mg via ORAL
  Filled 2011-03-10 (×16): qty 1

## 2011-03-10 MED ORDER — DIPHENHYDRAMINE HCL 50 MG PO CAPS
50.0000 mg | ORAL_CAPSULE | Freq: Every evening | ORAL | Status: DC | PRN
  Filled 2011-03-10: qty 1

## 2011-03-10 MED ORDER — IPRATROPIUM BROMIDE 0.02 % IN SOLN: 0.5000 mg | Freq: Once | RESPIRATORY_TRACT | Status: DC

## 2011-03-10 MED ORDER — ALBUTEROL SULFATE (5 MG/ML) 0.5% IN NEBU
2.5000 mg | INHALATION_SOLUTION | RESPIRATORY_TRACT | Status: DC | PRN
  Administered 2011-03-11 (×2): 2.5 mg via RESPIRATORY_TRACT
  Filled 2011-03-10 (×2): qty 0.5

## 2011-03-10 MED ORDER — ONDANSETRON HCL 4 MG/2ML IJ SOLN: 4.0000 mg | Freq: Four times a day (QID) | INTRAMUSCULAR | Status: DC | PRN

## 2011-03-10 MED ORDER — SODIUM CHLORIDE 0.9 % IJ SOLN
3.0000 mL | Freq: Two times a day (BID) | INTRAMUSCULAR | Status: DC
  Administered 2011-03-10 – 2011-03-14 (×8): 3 mL via INTRAVENOUS

## 2011-03-10 MED ORDER — LEVOTHYROXINE SODIUM 75 MCG PO TABS
75.0000 ug | ORAL_TABLET | Freq: Every day | ORAL | Status: DC
  Administered 2011-03-11 – 2011-03-17 (×6): 75 ug via ORAL
  Filled 2011-03-10 (×9): qty 1

## 2011-03-10 MED ORDER — METHYLPREDNISOLONE SODIUM SUCC 125 MG IJ SOLR
125.0000 mg | Freq: Once | INTRAMUSCULAR | Status: AC
  Administered 2011-03-10: 125 mg via INTRAVENOUS
  Filled 2011-03-10: qty 2

## 2011-03-10 MED ORDER — ALBUTEROL SULFATE (5 MG/ML) 0.5% IN NEBU
5.0000 mg | INHALATION_SOLUTION | Freq: Once | RESPIRATORY_TRACT | Status: AC
  Administered 2011-03-10: 5 mg via RESPIRATORY_TRACT
  Filled 2011-03-10: qty 1

## 2011-03-10 MED ORDER — FLUTICASONE PROPIONATE HFA 44 MCG/ACT IN AERO
2.0000 | INHALATION_SPRAY | Freq: Two times a day (BID) | RESPIRATORY_TRACT | Status: DC
  Administered 2011-03-10 – 2011-03-12 (×4): 2 via RESPIRATORY_TRACT
  Filled 2011-03-10: qty 10.6

## 2011-03-10 MED ORDER — SODIUM CHLORIDE 0.9 % IJ SOLN: 3.0000 mL | Freq: Two times a day (BID) | INTRAMUSCULAR | Status: DC

## 2011-03-10 MED ORDER — TAMSULOSIN HCL 0.4 MG PO CAPS
0.4000 mg | ORAL_CAPSULE | ORAL | Status: DC
  Administered 2011-03-10 – 2011-03-14 (×3): 0.4 mg via ORAL
  Filled 2011-03-10 (×5): qty 1

## 2011-03-10 MED ORDER — IPRATROPIUM BROMIDE 0.02 % IN SOLN
0.5000 mg | Freq: Four times a day (QID) | RESPIRATORY_TRACT | Status: DC
  Administered 2011-03-10 – 2011-03-12 (×8): 0.5 mg via RESPIRATORY_TRACT
  Filled 2011-03-10 (×8): qty 2.5

## 2011-03-10 MED ORDER — LITHIUM CARBONATE 300 MG PO CAPS
300.0000 mg | ORAL_CAPSULE | Freq: Three times a day (TID) | ORAL | Status: DC
  Administered 2011-03-10 – 2011-03-17 (×18): 300 mg via ORAL
  Filled 2011-03-10 (×24): qty 1

## 2011-03-10 MED ORDER — ALBUTEROL SULFATE (5 MG/ML) 0.5% IN NEBU
2.5000 mg | INHALATION_SOLUTION | Freq: Four times a day (QID) | RESPIRATORY_TRACT | Status: DC
  Administered 2011-03-10 – 2011-03-12 (×8): 2.5 mg via RESPIRATORY_TRACT
  Filled 2011-03-10 (×8): qty 0.5

## 2011-03-10 MED ORDER — ENOXAPARIN SODIUM 40 MG/0.4ML ~~LOC~~ SOLN
40.0000 mg | SUBCUTANEOUS | Status: DC
  Administered 2011-03-10 – 2011-03-11 (×2): 40 mg via SUBCUTANEOUS
  Filled 2011-03-10 (×3): qty 0.4

## 2011-03-10 MED ORDER — LISINOPRIL 5 MG PO TABS
5.0000 mg | ORAL_TABLET | Freq: Every day | ORAL | Status: DC
  Administered 2011-03-11 – 2011-03-12 (×2): 5 mg via ORAL
  Filled 2011-03-10 (×2): qty 1

## 2011-03-10 MED ORDER — HALOPERIDOL 1 MG PO TABS
1.0000 mg | ORAL_TABLET | Freq: Three times a day (TID) | ORAL | Status: DC | PRN
  Administered 2011-03-14 – 2011-03-16 (×4): 1 mg via ORAL
  Filled 2011-03-10 (×7): qty 1

## 2011-03-10 MED ORDER — GUAIFENESIN ER 600 MG PO TB12
600.0000 mg | ORAL_TABLET | Freq: Two times a day (BID) | ORAL | Status: DC
  Administered 2011-03-10 – 2011-03-12 (×5): 600 mg via ORAL
  Filled 2011-03-10 (×6): qty 1

## 2011-03-10 MED ORDER — RISPERIDONE 1 MG PO TABS
1.0000 mg | ORAL_TABLET | Freq: Every day | ORAL | Status: DC
  Administered 2011-03-11 – 2011-03-16 (×6): 1 mg via ORAL
  Filled 2011-03-10 (×7): qty 1

## 2011-03-10 MED ORDER — ONDANSETRON HCL 4 MG PO TABS: 4.0000 mg | ORAL_TABLET | Freq: Four times a day (QID) | ORAL | Status: DC | PRN

## 2011-03-10 MED ORDER — AMLODIPINE BESYLATE 10 MG PO TABS
10.0000 mg | ORAL_TABLET | Freq: Every day | ORAL | Status: DC
  Administered 2011-03-11 – 2011-03-16 (×6): 10 mg via ORAL
  Filled 2011-03-10 (×7): qty 1

## 2011-03-10 MED ORDER — RISPERIDONE 2 MG PO TABS
2.0000 mg | ORAL_TABLET | Freq: Every day | ORAL | Status: DC
  Administered 2011-03-10 – 2011-03-16 (×7): 2 mg via ORAL
  Filled 2011-03-10 (×9): qty 1

## 2011-03-10 MED ORDER — SODIUM CHLORIDE 0.9 % IV SOLN: 250.0000 mL | INTRAVENOUS | Status: DC | PRN

## 2011-03-10 MED ORDER — MOXIFLOXACIN HCL IN NACL 400 MG/250ML IV SOLN
400.0000 mg | INTRAVENOUS | Status: DC
  Administered 2011-03-10 – 2011-03-11 (×2): 400 mg via INTRAVENOUS
  Filled 2011-03-10 (×3): qty 250

## 2011-03-10 NOTE — ED Notes (Addendum)
ekg preformed by triage staff and given to Dr.Yelvington

## 2011-03-10 NOTE — ED Notes (Signed)
Pt returned from x-ray placed back on monitor, RT and RN at bedside.
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
## 2011-03-10 NOTE — ED Notes (Signed)
Pt returned from xray at this time.

## 2011-03-10 NOTE — ED Notes (Signed)
Pt requesting breakfast tray and coffee.  Per Dr. Ranae Palms, okay for pt to eat.  Breakfast tray ordered and coffee provided to pt.

## 2011-03-10 NOTE — ED Notes (Signed)
Informed by Toniann Fail, RN that she has cared for pt before and pt's caregiver reported pt shouldn't have coffee due to pt becomes aggressive.  Coffee taken from pt and caffeine free Coke given to replace it.  Pt has already received breakfast tray and eaten all food on it.

## 2011-03-10 NOTE — ED Provider Notes (Signed)
History     CSN: [id number]  Arrival date & time 03/10/11  0907   First MD Initiated Contact with Patient 03/10/11 [phone]      Chief Complaint  Patient presents with  . Shortness of Breath    (Consider location/radiation/quality/duration/timing/severity/associated sxs/prior treatment) Patient is a 56 y.o. male presenting with shortness of breath. The history is provided by the patient.  Shortness of Breath  The current episode started yesterday. The problem has been gradually worsening. Associated symptoms include shortness of breath and wheezing. Pertinent negatives include no chest pain, no chest pressure, no fever and no cough.  Pt with multiple ED visits for similar complaint and admission earlier this month p/w SOB and wheezing starting last night and progressing throughout night. No CP, fever, chills, worsening cough.   Past Medical History  Diagnosis Date  . Seizures   . Psychiatric disorder   . Schizoaffective disorder   . Organic mood disorder   . Intermittent explosive disorder   . Generalized anxiety disorder   . Mild mental retardation   . COPD (chronic obstructive pulmonary disease)   . Hypertension   . Peptic ulcer   . Thyroid disease   . Prostate hyperplasia with urinary obstruction   . Blind left eye     History reviewed. No pertinent past surgical history.  Family History  Problem Relation Age of Onset  . Hypertension      History  Substance Use Topics  . Smoking status: Current Everyday Smoker -- 0.2 packs/day    Types: Cigarettes  . Smokeless tobacco: Never Used  . Alcohol Use: No      Review of Systems  Constitutional: Negative for fever and chills.  Respiratory: Positive for shortness of breath and wheezing. Negative for cough.   Cardiovascular: Negative for chest pain, palpitations and leg swelling.  Gastrointestinal: Negative for abdominal pain.  Skin: Negative for color change, pallor and rash.  Neurological: Negative for weakness.     Allergies  Peanut-containing drug products; Coffee bean extract; Prolixin; Thorazine; and Lactose intolerance (gi)  Home Medications   No current outpatient prescriptions on file.  BP 128/73  Pulse 75  Temp(Src) 98.7 F (37.1 C) (Oral)  Resp 18  Wt 153 lb 3.5 oz (69.5 kg)  SpO2 94%  Physical Exam  Nursing note and vitals reviewed. Constitutional: He is oriented to person, place, and time. He appears well-developed and well-nourished. No distress.  HENT:  Head: Normocephalic and atraumatic.       Dry MM  Eyes: EOM are normal. Pupils are equal, round, and reactive to light.  Neck: Normal range of motion. Neck supple.  Cardiovascular: Normal rate and regular rhythm.   Pulmonary/Chest: He is in respiratory distress. He has wheezes. He has rales.  Abdominal: Soft. Bowel sounds are normal. There is no tenderness. There is no rebound and no guarding.  Musculoskeletal: Normal range of motion. He exhibits no edema and no tenderness.  Neurological: He is alert and oriented to person, place, and time.       5/5 motor, sensation intact  Skin: Skin is warm and dry. No rash noted. No erythema.  Psychiatric: He has a normal mood and affect. His behavior is normal.    ED Course  Procedures (including critical care time)  Labs Reviewed  CBC - Abnormal; Notable for the following:    Hemoglobin 12.8 (*)    All other components within normal limits  DIFFERENTIAL - Abnormal; Notable for the following:    Eosinophils Relative 13 (*)  Eosinophils Absolute 1.2 (*)    All other components within normal limits  COMPREHENSIVE METABOLIC PANEL - Abnormal; Notable for the following:    Glucose, Bld 119 (*)    All other components within normal limits  CBC - Abnormal; Notable for the following:    Hemoglobin 11.5 (*)    HCT 36.4 (*)    MCH 25.6 (*)    All other components within normal limits  PHOSPHORUS - Abnormal; Notable for the following:    Phosphorus 1.8 (*)    All other  components within normal limits  BASIC METABOLIC PANEL - Abnormal; Notable for the following:    Glucose, Bld 101 (*)    All other components within normal limits  CBC - Abnormal; Notable for the following:    WBC 11.5 (*)    RBC 4.09 (*)    Hemoglobin 10.6 (*)    HCT 33.1 (*)    MCH 25.9 (*)    All other components within normal limits  CARDIAC PANEL(CRET KIN+CKTOT+MB+TROPI)  CREATININE, SERUM  MAGNESIUM  TSH  CARDIAC PANEL(CRET KIN+CKTOT+MB+TROPI)  CARDIAC PANEL(CRET KIN+CKTOT+MB+TROPI)  HEMOGLOBIN A1C  PRO B NATRIURETIC PEPTIDE  CARDIAC PANEL(CRET KIN+CKTOT+MB+TROPI)  GLUCOSE, CAPILLARY  CBC  BASIC METABOLIC PANEL   Dg Chest 2 View  03/10/2011  *RADIOLOGY REPORT*  Clinical Data: Shortness of breath.  CHEST - 2 VIEW  Comparison: 03/04/2011 and CT from 02/18/2011  Findings: Two views of the chest were obtained.  Again noted is hyperinflation and chronic lung changes.  There are slightly increased densities in the left lung base and cannot exclude an acute process in this area.  Evidence for chronic changes in the right upper lung.  Heart size is stable and within normal limits. The trachea is midline.  IMPRESSION: Chronic lung changes.  The patient has chronic changes at the left lung base but it is more conspicuous on today's examination. Difficult to exclude an acute process in this area.  Original Report Authenticated By: Richarda Overlie, M.D.     1. COPD exacerbation       MDM          Loren Racer, MD 03/12/11 [phone]

## 2011-03-10 NOTE — ED Notes (Signed)
Pt. Was dropped off in the front and the care giver just left. Pt. Wheezing with rales sound with SOB.

## 2011-03-11 ENCOUNTER — Other Ambulatory Visit: Payer: Self-pay

## 2011-03-11 LAB — GLUCOSE, CAPILLARY

## 2011-03-11 LAB — CBC
HCT: 33.1 % — ABNORMAL LOW (ref 39.0–52.0)
Hemoglobin: 10.6 g/dL — ABNORMAL LOW (ref 13.0–17.0)
MCV: 80.9 fL (ref 78.0–100.0)
RBC: 4.09 MIL/uL — ABNORMAL LOW (ref 4.22–5.81)
WBC: 11.5 10*3/uL — ABNORMAL HIGH (ref 4.0–10.5)

## 2011-03-11 LAB — CARDIAC PANEL(CRET KIN+CKTOT+MB+TROPI)
Relative Index: INVALID (ref 0.0–2.5)
Troponin I: <0.3 ng/mL (ref <0.30)

## 2011-03-11 LAB — BASIC METABOLIC PANEL
CO2: 28 mEq/L (ref 19–32)
Chloride: 107 mEq/L (ref 96–112)
GFR calc Af Amer: >90 mL/min (ref >90)
Potassium: 3.9 mEq/L (ref 3.5–5.1)

## 2011-03-11 MED ORDER — METHYLPREDNISOLONE SODIUM SUCC 40 MG IJ SOLR
40.0000 mg | Freq: Two times a day (BID) | INTRAMUSCULAR | Status: DC
  Administered 2011-03-11 – 2011-03-12 (×3): 40 mg via INTRAVENOUS
  Filled 2011-03-11 (×4): qty 1

## 2011-03-11 NOTE — Progress Notes (Signed)
Patient ID: [name], male   DOB: 1955/03/18, 56 y.o.   MRN: [id number]  Subjective: No events overnight. Patient denies chest pain, shortness of breath, abdominal pain.   Objective:  Vital signs in last 24 hours:  Filed Vitals:   03/23/11 2020 2011-03-23 2100 03/11/11 0500 03/11/11 0729  BP:  106/47 105/78   Pulse:  74 77   Temp:  98.1 F (36.7 C) 97.9 F (36.6 C)   TempSrc:      Resp:  18 22   Weight:      SpO2: 93% 95% 94% 94%    Intake/Output from previous day:   Intake/Output Summary (Last 24 hours) at 03/11/11 1210 Last data filed at 03/11/11 1200  Gross per 24 hour  Intake   3253 ml  Output   4251 ml  Net   -998 ml    Physical Exam: General: Alert, awake, oriented x3, in no acute distress. HEENT: No bruits, no goiter. Moist mucous membranes, no scleral icterus, no conjunctival pallor. Heart: Regular rate and rhythm, S1/S2 +, no murmurs, rubs, gallops. Lungs: Clear to auscultation bilaterally with expiratory wheezing, no rhonchi, no rales. Not in respiratory distress Abdomen: Soft, nontender, nondistended, positive bowel sounds. Extremities: No clubbing or cyanosis, no pitting edema,  positive pedal pulses. Neuro: Grossly nonfocal.  Lab Results:  Basic Metabolic Panel:    Component Value Date/Time   NA 142 03/11/2011 0656   K 3.9 03/11/2011 0656   CL 107 03/11/2011 0656   CO2 28 03/11/2011 0656   BUN 11 03/11/2011 0656   CREATININE 0.85 03/11/2011 0656   GLUCOSE 101* 03/11/2011 0656   CALCIUM 9.6 03/11/2011 0656   CBC:    Component Value Date/Time   WBC 11.5* 03/11/2011 0656   HGB 10.6* 03/11/2011 0656   HCT 33.1* 03/11/2011 0656   PLT 198 03/11/2011 0656   MCV 80.9 03/11/2011 0656   NEUTROABS 5.6 23-Mar-2011 0930   LYMPHSABS 1.5 2011-03-23 0930   MONOABS 0.9 03-23-11 0930   EOSABS 1.2* 03-23-2011 0930   BASOSABS 0.1 2011-03-23 0930      Lab 03/11/11 0656 03/23/2011 1253 2011/03/23 0930 03/05/11 0440  WBC 11.5* 9.6 9.1 8.1  HGB 10.6* 11.5* 12.8* 10.4*  HCT  33.1* 36.4* 40.0 32.5*  PLT 198 234 242 233  MCV 80.9 81.1 81.5 81.5  MCH 25.9* 25.6* 26.1 26.1  MCHC 32.0 31.6 32.0 32.0  RDW 14.6 14.5 14.5 14.0  LYMPHSABS -- -- 1.5 --  MONOABS -- -- 0.9 --  EOSABS -- -- 1.2* --  BASOSABS -- -- 0.1 --  BANDABS -- -- -- --    Lab 03/11/11 0656 Mar 23, 2011 1253 03-23-2011 0930 03/04/11 1310  NA 142 -- 142 140  K 3.9 -- 4.5 4.1  CL 107 -- 106 103  CO2 28 -- 28 28  GLUCOSE 101* -- 119* 160*  BUN 11 -- 10 12  CREATININE 0.85 0.94 0.84 0.99  CALCIUM 9.6 -- 9.8 10.3  MG -- 2.1 -- --   No results found for this basename: INR:5,PROTIME:5 in the last 168 hours Cardiac markers:  Lab 03/11/11 0656 03-23-11 2008 03/23/11 1253  CKMB 2.4 2.8 2.9  TROPONINI <0.30 <0.30 <0.30  MYOGLOBIN -- -- --   No components found with this basename: POCBNP:3 No results found for this or any previous visit (from the past 240 hour(s)).  Studies/Results: Dg Chest 2 View 03-23-11 IMPRESSION: Chronic lung changes.  The patient has chronic changes at the left lung base but it is  more conspicuous on today's examination. Difficult to exclude an acute process in this area.     Medications: Scheduled Meds:   . albuterol  2.5 mg Nebulization Q6H  . amLODipine  10 mg Oral Daily  . docusate sodium  100 mg Oral BID  . enoxaparin  40 mg Subcutaneous Q24H  . fluticasone  2 puff Inhalation BID  . guaiFENesin  600 mg Oral BID  . ipratropium  0.5 mg Nebulization Q6H  . levothyroxine  75 mcg Oral Q breakfast  . lisinopril  5 mg Oral Daily  . lithium carbonate  300 mg Oral TID WC  . moxifloxacin  400 mg Intravenous Q24H  . pantoprazole  40 mg Oral Daily  . risperiDONE  1 mg Oral Daily  . risperiDONE  2 mg Oral QHS  . sodium chloride  3 mL Intravenous Q12H  . sodium chloride  3 mL Intravenous Q12H  . Tamsulosin HCl  0.4 mg Oral PC supper   Continuous Infusions:  PRN Meds:.sodium chloride, albuterol, diphenhydrAMINE, haloperidol, ondansetron (ZOFRAN) IV, ondansetron, sodium  chloride  Assessment/Plan:  Active Problems:  COPD with acute exacerbation  - pt now clinically improving with nebulizers and supportive care with oxygen  - will monitor vitals per floor protocol  - provide supportive care with nebs PRN and scheduled, IS while awake Q 4 hours  - will also start antibiotics given symptoms and ? CXR findings of CAP  - continue solumedrol as wheezing is still present  Schizoaffective disorder  - this appears to be stable  - will continue home medication regimen for now   Hypothyroidism  - will check TSH   HTN (hypertension)  - stable  - will continue to monitor vitals per floor protocol   Tobacco use  - cessation consult provided by me    EDUCATION - test results and diagnostic studies were discussed with patient - patient verbalized the understanding - questions were answered at the bedside and contact information was provided for additional questions or concerns   LOS: 1 day   MAGICK-Carmencita Cusic 03/11/2011, 12:10 PM  [facility] Pager: [phone]

## 2011-03-12 ENCOUNTER — Inpatient Hospital Stay (HOSPITAL_COMMUNITY): Payer: Medicare Other

## 2011-03-12 ENCOUNTER — Encounter (HOSPITAL_COMMUNITY): Payer: Self-pay | Admitting: Radiology

## 2011-03-12 DIAGNOSIS — F71 Moderate intellectual disabilities: Secondary | ICD-10-CM

## 2011-03-12 DIAGNOSIS — I10 Essential (primary) hypertension: Secondary | ICD-10-CM

## 2011-03-12 DIAGNOSIS — G40309 Generalized idiopathic epilepsy and epileptic syndromes, not intractable, without status epilepticus: Secondary | ICD-10-CM

## 2011-03-12 DIAGNOSIS — J441 Chronic obstructive pulmonary disease with (acute) exacerbation: Secondary | ICD-10-CM

## 2011-03-12 LAB — CBC
HCT: 34.3 % — ABNORMAL LOW (ref 39.0–52.0)
HCT: 33.1 % — ABNORMAL LOW (ref 39.0–52.0)
Hemoglobin: 11 g/dL — ABNORMAL LOW (ref 13.0–17.0)
Hemoglobin: 10.6 g/dL — ABNORMAL LOW (ref 13.0–17.0)
MCV: 81.1 fL (ref 78.0–100.0)
MCV: 81.3 fL (ref 78.0–100.0)
RBC: 4.23 MIL/uL (ref 4.22–5.81)
RBC: 4.07 MIL/uL — ABNORMAL LOW (ref 4.22–5.81)
RDW: 15.1 % (ref 11.5–15.5)
WBC: 12.2 10*3/uL — ABNORMAL HIGH (ref 4.0–10.5)
WBC: 11.9 10*3/uL — ABNORMAL HIGH (ref 4.0–10.5)

## 2011-03-12 LAB — PROTIME-INR
INR: 0.95 (ref 0.00–1.49)
Prothrombin Time: 12.9 seconds (ref 11.6–15.2)

## 2011-03-12 LAB — BLOOD GAS, ARTERIAL
Drawn by: 325261
O2 Content: 2 L/min
O2 Saturation: 93.7 %
pCO2 arterial: 43.9 mmHg (ref 35.0–45.0)
pO2, Arterial: 67.4 mmHg — ABNORMAL LOW (ref 80.0–100.0)

## 2011-03-12 LAB — BASIC METABOLIC PANEL
BUN: 14 mg/dL (ref 6–23)
CO2: 27 mEq/L (ref 19–32)
Chloride: 108 mEq/L (ref 96–112)
Creatinine, Ser: 0.74 mg/dL (ref 0.50–1.35)
Potassium: 4 mEq/L (ref 3.5–5.1)

## 2011-03-12 LAB — GLUCOSE, CAPILLARY: Glucose-Capillary: 137 mg/dL — ABNORMAL HIGH (ref 70–99)

## 2011-03-12 LAB — APTT: aPTT: 31 seconds (ref 24–37)

## 2011-03-12 LAB — PHOSPHORUS: Phosphorus: 4.1 mg/dL (ref 2.3–4.6)

## 2011-03-12 LAB — MAGNESIUM: Magnesium: 2.1 mg/dL (ref 1.5–2.5)

## 2011-03-12 MED ORDER — METHYLPREDNISOLONE SODIUM SUCC 125 MG IJ SOLR
80.0000 mg | Freq: Three times a day (TID) | INTRAMUSCULAR | Status: DC
  Administered 2011-03-12 – 2011-03-14 (×5): 80 mg via INTRAVENOUS
  Filled 2011-03-12: qty 1.28
  Filled 2011-03-12: qty 2
  Filled 2011-03-12 (×6): qty 1.28

## 2011-03-12 MED ORDER — PREDNISONE 50 MG PO TABS: ORAL_TABLET | ORAL | Status: DC

## 2011-03-12 MED ORDER — IPRATROPIUM BROMIDE 0.02 % IN SOLN
0.5000 mg | RESPIRATORY_TRACT | Status: DC
  Administered 2011-03-12 – 2011-03-14 (×9): 0.5 mg via RESPIRATORY_TRACT
  Filled 2011-03-12 (×10): qty 2.5

## 2011-03-12 MED ORDER — MOXIFLOXACIN HCL 400 MG PO TABS: 400.0000 mg | ORAL_TABLET | Freq: Every day | ORAL | Status: DC

## 2011-03-12 MED ORDER — SODIUM CHLORIDE 0.9 % IV SOLN
1000.0000 mg | INTRAVENOUS | Status: AC
  Administered 2011-03-12: 1000 mg via INTRAVENOUS
  Filled 2011-03-12: qty 10

## 2011-03-12 MED ORDER — SODIUM CHLORIDE 0.9 % IV SOLN
500.0000 mg | Freq: Two times a day (BID) | INTRAVENOUS | Status: DC
  Filled 2011-03-12 (×2): qty 5

## 2011-03-12 MED ORDER — ALBUTEROL SULFATE (5 MG/ML) 0.5% IN NEBU
2.5000 mg | INHALATION_SOLUTION | RESPIRATORY_TRACT | Status: DC
  Administered 2011-03-12 – 2011-03-14 (×10): 2.5 mg via RESPIRATORY_TRACT
  Filled 2011-03-12 (×10): qty 0.5

## 2011-03-12 MED ORDER — LORAZEPAM 2 MG/ML IJ SOLN
INTRAMUSCULAR | Status: AC
  Administered 2011-03-12: 2 mg via INTRAVENOUS
  Filled 2011-03-12: qty 1

## 2011-03-12 MED ORDER — LORAZEPAM 2 MG/ML IJ SOLN
2.0000 mg | Freq: Once | INTRAMUSCULAR | Status: AC
  Administered 2011-03-12 (×2): 2 mg via INTRAVENOUS

## 2011-03-12 NOTE — Discharge Instructions (Signed)

## 2011-03-12 NOTE — Consult Note (Signed)
[facility] CONSULT NOTE     Reason for Consult: Altered mental status with right focal seizure activity   HPI:    [name] is an 56 y.o. male history of seizure disorder, schizoaffective disorder, mild mental retardation, COPD and hypertension who developed right focal seizures about 1740 this afternoon. Patient was noted to have head turning to the left side as well as twitching of his right arm and right leg and gaze to the left side as well. He was inattentive for the most part. There were periods of unresponsiveness although he was confused when the focal seizure activity subsided. Patient has not been on anticonvulsant medication. He was admitted on 03/10/2011 for COPD exacerbation. He was scheduled for discharge later this evening. He was given a total of 4 mg of Ativan which appeared to control his seizure activity. He started to become more alert and responsive. Long-term anticoagulation with Keppra 1000 mg was started intravenously. Patient developed increasing respiratory effort with audible wheezing. Episode appeared to have significant difficulty controlling oral secretions and required suction. Is unclear whether he possibly aspirated as well. He was moved to the intensive. For further management, in anticipation of possible need for intubation.  Past Medical History  Diagnosis Date  . Seizures   . Psychiatric disorder   . Schizoaffective disorder   . Organic mood disorder   . Intermittent explosive disorder   . Generalized anxiety disorder   . Mild mental retardation   . COPD (chronic obstructive pulmonary disease)   . Hypertension   . Peptic ulcer   . Thyroid disease   . Prostate hyperplasia with urinary obstruction   . Blind left eye     History reviewed. No pertinent past surgical history.  Family History  Problem Relation Age of Onset  . Hypertension      Social History:  reports that he has been smoking Cigarettes.  He has  been smoking about .25 packs per day. He has never used smokeless tobacco. He reports that he does not drink alcohol or use illicit drugs.  Allergies  Allergen Reactions  . Peanut-Containing Drug Products Anaphylaxis  . Coffee Bean Extract     Per group home owner requested coffee be listed as an allergy due to volatile behavior when the pt takes it  . Prolixin (Fluphenazine Hcl) Other (See Comments)    "muscle cramps"  . Thorazine (Chlorpromazine Hcl) Other (See Comments)    "burns"  . Lactose Intolerance (Gi) Rash    Medications:    Scheduled:   . albuterol  2.5 mg Nebulization Q6H  . amLODipine  10 mg Oral Daily  . docusate sodium  100 mg Oral BID  . enoxaparin  40 mg Subcutaneous Q24H  . ipratropium  0.5 mg Nebulization Q6H  . levetiracetam  1,000 mg Intravenous STAT  . levetiracetam  500 mg Intravenous Q12H  . levothyroxine  75 mcg Oral Q breakfast  . lithium carbonate  300 mg Oral TID WC  . LORazepam      . LORazepam      . LORazepam  2 mg Intravenous Once  . methylPREDNISolone (SOLU-MEDROL) injection  40 mg Intravenous BID  . pantoprazole  40 mg Oral Daily  . risperiDONE  1 mg Oral Daily  . risperiDONE  2 mg Oral QHS  . sodium chloride  3 mL Intravenous Q12H  . sodium chloride  3 mL Intravenous Q12H  . Tamsulosin HCl  0.4 mg Oral PC supper  . DISCONTD: fluticasone  2 puff Inhalation BID  . DISCONTD: guaiFENesin  600 mg Oral BID  . DISCONTD: lisinopril  5 mg Oral Daily  . DISCONTD: moxifloxacin  400 mg Intravenous Q24H     Blood pressure 146/83, pulse 77, temperature 97.9 F (36.6 C), temperature source Oral, resp. rate 18, weight 69.5 kg (153 lb 3.5 oz), SpO2 96.00%.   Neurologic Examination:  Mental Status: Patient was initially to partake but became more alert and responsive. Speech was slurred but consistent with edentulous state. Able to follow simple commands without difficulty. Cranial Nerves: II-Visual fields grossly intact. III/IV/VI-Extraocular  movements intact.  Pupils reactive bilaterally. VII-facial asymmetry with flattening of right nasolabial fold VIII-grossly intact X-normal gag; dysarthric speech as described above XII-midline tongue extension Motor: 5/5 bilaterally with normal tone and bulk Sensory: light touch intact throughout, bilaterally Deep Tendon Reflexes: 2+ and symmetric throughout Plantars: Downgoing bilaterally Cerebellar: Not tested  Lab Results  Component Value Date/Time   CHOL 150 02/01/2011 10:10 PM    Results for orders placed during the hospital encounter of 03/10/11 (from the past 48 hour(s))  CARDIAC PANEL(CRET KIN+CKTOT+MB+TROPI)     Status: Normal   Collection Time   03/10/11  8:08 PM      Component Value Range Comment   Total CK 50  7 - 232 (U/L)    CK, MB 2.8  0.3 - 4.0 (ng/mL)    Troponin I <0.30  <0.30 (ng/mL)    Relative Index RELATIVE INDEX IS INVALID  0.0 - 2.5    GLUCOSE, CAPILLARY     Status: Normal   Collection Time   03/11/11  6:35 AM      Component Value Range Comment   Glucose-Capillary 99  70 - 99 (mg/dL)    Comment 1 Notify RN     CARDIAC PANEL(CRET KIN+CKTOT+MB+TROPI)     Status: Normal   Collection Time   03/11/11  6:56 AM      Component Value Range Comment   Total CK 40  7 - 232 (U/L)    CK, MB 2.4  0.3 - 4.0 (ng/mL)    Troponin I <0.30  <0.30 (ng/mL)    Relative Index RELATIVE INDEX IS INVALID  0.0 - 2.5    BASIC METABOLIC PANEL     Status: Abnormal   Collection Time   03/11/11  6:56 AM      Component Value Range Comment   Sodium 142  135 - 145 (mEq/L)    Potassium 3.9  3.5 - 5.1 (mEq/L)    Chloride 107  96 - 112 (mEq/L)    CO2 28  19 - 32 (mEq/L)    Glucose, Bld 101 (*) 70 - 99 (mg/dL)    BUN 11  6 - 23 (mg/dL)    Creatinine, Ser 1.61  0.50 - 1.35 (mg/dL)    Calcium 9.6  8.4 - 10.5 (mg/dL)    GFR calc non Af Amer >90  >90 (mL/min)    GFR calc Af Amer >90  >90 (mL/min)   CBC     Status: Abnormal   Collection Time   03/11/11  6:56 AM      Component Value Range  Comment   WBC 11.5 (*) 4.0 - 10.5 (K/uL)    RBC 4.09 (*) 4.22 - 5.81 (MIL/uL)    Hemoglobin 10.6 (*) 13.0 - 17.0 (g/dL)    HCT 09.6 (*) 04.5 - 52.0 (%)  MCV 80.9  78.0 - 100.0 (fL)    MCH 25.9 (*) 26.0 - 34.0 (pg)    MCHC 32.0  30.0 - 36.0 (g/dL)    RDW 45.4  09.8 - 11.9 (%)    Platelets 198  150 - 400 (K/uL)   CBC     Status: Abnormal   Collection Time   03/12/11  6:42 AM      Component Value Range Comment   WBC 12.2 (*) 4.0 - 10.5 (K/uL)    RBC 4.23  4.22 - 5.81 (MIL/uL)    Hemoglobin 11.0 (*) 13.0 - 17.0 (g/dL)    HCT 14.7 (*) 82.9 - 52.0 (%)    MCV 81.1  78.0 - 100.0 (fL)    MCH 26.0  26.0 - 34.0 (pg)    MCHC 32.1  30.0 - 36.0 (g/dL)    RDW 56.2  13.0 - 86.5 (%)    Platelets 214  150 - 400 (K/uL)   BASIC METABOLIC PANEL     Status: Abnormal   Collection Time   03/12/11  6:42 AM      Component Value Range Comment   Sodium 142  135 - 145 (mEq/L)    Potassium 4.0  3.5 - 5.1 (mEq/L)    Chloride 108  96 - 112 (mEq/L)    CO2 27  19 - 32 (mEq/L)    Glucose, Bld 135 (*) 70 - 99 (mg/dL)    BUN 14  6 - 23 (mg/dL)    Creatinine, Ser 7.84  0.50 - 1.35 (mg/dL)    Calcium 69.6  8.4 - 10.5 (mg/dL)    GFR calc non Af Amer >90  >90 (mL/min)    GFR calc Af Amer >90  >90 (mL/min)   GLUCOSE, CAPILLARY     Status: Normal   Collection Time   03/12/11  7:46 AM      Component Value Range Comment   Glucose-Capillary 99  70 - 99 (mg/dL)    Comment 1 Notify RN     GLUCOSE, CAPILLARY     Status: Abnormal   Collection Time   03/12/11  5:51 PM      Component Value Range Comment   Glucose-Capillary 137 (*) 70 - 99 (mg/dL)   BLOOD GAS, ARTERIAL     Status: Abnormal   Collection Time   03/12/11  6:20 PM      Component Value Range Comment   O2 Content 2.0      Delivery systems NASAL CANNULA      pH, Arterial 7.404  7.350 - 7.450     pCO2 arterial 43.9  35.0 - 45.0 (mmHg)    pO2, Arterial 67.4 (*) 80.0 - 100.0 (mmHg)    Bicarbonate 26.9 (*) 20.0 - 24.0 (mEq/L)    TCO2 28.2  0 - 100 (mmol/L)      Acid-Base Excess 2.6 (*) 0.0 - 2.0 (mmol/L)    O2 Saturation 93.7      Patient temperature 98.6      Collection site LEFT RADIAL      Drawn by [id number]      Sample type ARTERIAL DRAW      Allens test (pass/fail) PASS  PASS      Assessment/Plan:  62. 55 year old man with history of seizure disorder, not currently on anticonvulsant medication, experiencing recurrent right focal motor seizures. 2. History of schizoaffective disorder 3. History of mild mental retardation  Plan: 1. Keppra 500 mg IV every 12 hours 2. CT of the head without contrast  to rule out acute intracranial abnormality such as hemorrhage or stroke 3. EEG in the a.m.  Venetia Maxon M.D. [facility] [phone]  03/12/2011, 6:45 PM

## 2011-03-12 NOTE — Progress Notes (Signed)
Utilization review completed. [name] 03/12/2011 

## 2011-03-12 NOTE — Discharge Summary (Addendum)
Patient ID: [name] MRN: [id number] DOB/AGE: 57-Jul-1957 57 y.o.  Admit date: 03/10/2011 Discharge date: 03/12/2011  Primary Care Physician:  No primary provider on file.  Discharge Diagnoses:    Present on Admission:  .Tobacco use .Schizoaffective disorder .Hypothyroidism .HTN (hypertension) .COPD with acute exacerbation  Active Problems:  COPD with acute exacerbation  Schizoaffective disorder  Hypothyroidism  HTN (hypertension)  Tobacco use   Medication List  As of 03/12/2011 10:08 AM   TAKE these medications         albuterol-ipratropium 18-103 MCG/ACT inhaler   Commonly known as: COMBIVENT   Inhale 2 puffs into the lungs every 6 (six) hours as needed. Shortness of breath.      amLODipine 10 MG tablet   Commonly known as: NORVASC   Take 10 mg by mouth daily.      diphenhydrAMINE 50 MG capsule   Commonly known as: BENADRYL   Take 50 mg by mouth at bedtime as needed. For insomnia      docusate sodium 100 MG capsule   Commonly known as: COLACE   Take 100 mg by mouth 2 (two) times daily.      fluticasone 44 MCG/ACT inhaler   Commonly known as: FLOVENT HFA   Inhale 2 puffs into the lungs 2 (two) times daily.      haloperidol 2 MG tablet   Commonly known as: HALDOL   Take 2 mg by mouth every 6 (six) hours as needed. agitation      levothyroxine 75 MCG tablet   Commonly known as: SYNTHROID, LEVOTHROID   Take 75 mcg by mouth daily.      lisinopril 5 MG tablet   Commonly known as: PRINIVIL,ZESTRIL   Take 5 mg by mouth daily.      lithium carbonate 300 MG capsule   Take 300 mg by mouth 3 (three) times daily with meals.      moxifloxacin 400 MG tablet   Commonly known as: AVELOX   Take 1 tablet (400 mg total) by mouth daily at 6 PM.      omeprazole 20 MG capsule   Commonly known as: PRILOSEC   Take 20 mg by mouth 2 (two) times daily.      potassium chloride 20 MEQ packet   Commonly known as: KLOR-CON   Take 20 mEq by mouth daily.      predniSONE  50 MG tablet   Commonly known as: DELTASONE   Take 50 mg tablet today and taper down by 10 mg per day until completed      risperiDONE 2 MG tablet   Commonly known as: RISPERDAL   Take 2 mg by mouth at bedtime.      risperiDONE 1 MG tablet   Commonly known as: RISPERDAL   Take 1 mg by mouth every morning.      Tamsulosin HCl 0.4 MG Caps   Commonly known as: FLOMAX   Take 0.4 mg by mouth daily after supper.      traZODone 100 MG tablet   Commonly known as: DESYREL   Take 100 mg by mouth at bedtime.            Disposition and Follow-up: With provider at the Group home within 1-2 weeks of discharge to ensure that shortness of breath resolved and pt clinically at his baseline. He is discharged with good oxygen saturations and no shortness of breath. Please note that pt will need to complete Avelox treatment as well as short course of prednisone  taper.   Consults: none  Significant Diagnostic Studies:  Dg Chest 2 View 03/10/2011  IMPRESSION: Chronic lung changes.  The patient has chronic changes at the left lung base but it is more conspicuous on today's examination. Difficult to exclude an acute process in this area.   Brief H and P: Pt is 56 yo male who presents to [facility] ED with main concern of progressively worsening shortness of breath that started 2-3 days prior to admission and has been associated with subjective fevers and chills. In addition pt reports intermittent mostly productive cough but endorses underlined chronic nonproductive cough. In addition, pt denies any specific aggravating or alleviating factors, no abdominal or urinary concerns, no chest pain other than when coughing, no specific focal neurologic weakness, no headaches or visual changes, no stiff neck. No recent sick contacts or exposures.   Physical Exam on Discharge:  Filed Vitals:   03/11/11 1932 03/11/11 2100 03/12/11 0500 03/12/11 0846  BP:  141/73 128/73   Pulse:  62 75   Temp:  97.3 F (36.3 C) 98.7 F  (37.1 C)   TempSrc:      Resp:  18 18   Weight:      SpO2: 95% 93% 94% 96%     Intake/Output Summary (Last 24 hours) at 03/12/11 1008 Last data filed at 03/12/11 0900  Gross per 24 hour  Intake   2650 ml  Output   4925 ml  Net  -2275 ml   General: Alert, awake, follows commands appropriately, in no acute distress. HEENT: No bruits, no goiter. Heart: Regular rate and rhythm, without murmurs, rubs, gallops. Lungs: Clear to auscultation bilaterally with no crackles. Abdomen: Soft, nontender, nondistended, positive bowel sounds. Extremities: No clubbing cyanosis or edema with positive pedal pulses. Neuro: Grossly intact, nonfocal. Follows commands appropriately  CBC:    Component Value Date/Time   WBC 12.2* 03/12/2011 0642   HGB 11.0* 03/12/2011 0642   HCT 34.3* 03/12/2011 0642   PLT 214 03/12/2011 0642   MCV 81.1 03/12/2011 0642   NEUTROABS 5.6 03/10/2011 0930   LYMPHSABS 1.5 03/10/2011 0930   MONOABS 0.9 03/10/2011 0930   EOSABS 1.2* 03/10/2011 0930   BASOSABS 0.1 03/10/2011 0930    Basic Metabolic Panel:    Component Value Date/Time   NA 142 03/12/2011 0642   K 4.0 03/12/2011 0642   CL 108 03/12/2011 0642   CO2 27 03/12/2011 0642   BUN 14 03/12/2011 0642   CREATININE 0.74 03/12/2011 0642   GLUCOSE 135* 03/12/2011 0642   CALCIUM 10.0 03/12/2011 0642    Hospital Course:  Active Problems:  COPD with acute exacerbation - pt has responded well to nebulizers and oxygen therapy - he was also started on steroids and antibiotics - he has maintained oxygen saturations > 95% on RA - he has remained afebrile over 72 hour period - leukocytosis is secondary to prednisone on board and was noted to be trending down   Schizoaffective disorder - remains stable and at pt's baseline   Hypothyroidism - TSH within normal limits - Stable and will continue same medication dose of 75 mcg daily   HTN (hypertension) - stable during the hospitalization   Tobacco use - smoking cessation discussed  and questions answered   DISPOSITION - plan of care and diagnosis, diagnostic studies and test results were discussed with pt at bedside - pt verbalized understanding  Time spent on Discharge: Over 30 minutes  Signed: Debbora Presto 03/12/2011, 10:08 AM  [facility]  [phone]  ADDENDUM: Call from nurse in charge, pt actively seizing, neurologist at bedside - will order 1 g loading dose of Keppra and will continue with Keppra 500 mg IB BID - Ativan 2 mg IV given x 2 doses and pt still actively seizing - will order CT head - transfer the patient to SDU - obtain blood work in the morning

## 2011-03-12 NOTE — Progress Notes (Signed)
Anticipate pt return to [facility] group home today via group home transport. Ryan, group home manager, is to provide transport around 8:00pm and his contact # is [phone]. Pt's d/c summary, AVS, and FL-2 left with shadow chart and RN informed. No other Clinical Social Worker needs at this time. Clinical Social Worker signing off.   Jacklynn Lewis, MSW, LCSWA  Clinical Social Work [phone] (cover)

## 2011-03-12 NOTE — Significant Event (Signed)
Rapid Response Event Note  Overview: Time Called: 1740 Arrival Time: 1743 Event Type: Neurologic  Initial Focused Assessment: On arrival patient supine in bed - called as Code Stroke.  RN Neysa Bonito at bedside - stated at 1720 patient was at baseline - sitting up talking - called out to staff at 1730 and said he needed help with right leg/face numbness.  Dr. Herbie Baltimore present in room - not patients MD but on floor and heard need  - in to assess and assist.  Dr. Herbie Baltimore reported patient seemed to be post-ictal. Patient began to have rigid tonic movements right side - eyes deviated to left - lasted about 1 minute - right side facial droop moves all extremities - not following commands at this time - 148/64 SR 98 RR 26 O2 sats 93% on 2 liter nasal cannula.  Dr. Roseanne Reno notified - to bedside.  Patient continue with intermittent periods of right side twitching with facial twitching and left side gaze.  Periods of confusion follow.  No incontinence noted.  Total of 4 mg Ativan IV given per order Dr. Roseanne Reno.  Dr. Joelene Millin present.  BP 169/101 HR 114 RR 22 Sats 99%.  Feels hot to touch - rectal temp 99.7.  Resps unlabored but exp wheezing noted.  ABG drawn.     Interventions:   Event Summary:Transfered to 3110 .  Became more lucid with transfer - more alert - talking at baseline - asking for water.    at      at          [place], [place]

## 2011-03-12 NOTE — Consult Note (Addendum)
Date of admit 03/10/2011  9:10 AM Date of consultation 03/12/2011   Reason for Consult:Seizure needing ICU transfer on 03/12/2011  Referring Physician: Dr Verta Ellen  [name] is an 56 y.o. male.   Date: 03/12/2011  Patient name: [name] Medical record number: [id number] Date of birth: December 05, 1955 Age: 56 y.o. Gender: male PCP: No primary provider on file.  Medical Service: PCCM,MD   Lines/Tubes: None  Microbiology/Sepsis markers:  Results for orders placed during the hospital encounter of 01/31/11  CULTURE, SPUTUM-ASSESSMENT     Status: Normal   Collection Time   01/31/11 11:23 PM      Component Value Range Status Comment   Specimen Description SPUTUM EXPECTORATED   Final    Special Requests NONE   Final    Sputum evaluation     Final    Value: THIS SPECIMEN IS ACCEPTABLE. RESPIRATORY CULTURE REPORT TO FOLLOW.   Report Status 02/01/2011 FINAL   Final   CULTURE, RESPIRATORY     Status: Normal   Collection Time   01/31/11 11:30 PM      Component Value Range Status Comment   Specimen Description SPUTUM EXPECTORATED   Final    Special Requests NONE   Final    Gram Stain     Final    Value: FEW WBC PRESENT,BOTH PMN AND MONONUCLEAR     RARE SQUAMOUS EPITHELIAL CELLS PRESENT     NO ORGANISMS SEEN   Culture NORMAL OROPHARYNGEAL FLORA   Final    Report Status 02/03/2011 FINAL   Final     Anti-infectives:  Anti-infectives     Start     Dose/Rate Route Frequency Ordered Stop   03/12/11 0000   moxifloxacin (AVELOX) 400 MG tablet        400 mg Oral Daily-1800 03/12/11 1007 03/22/11 2359   03/10/11 2000   moxifloxacin (AVELOX) IVPB 400 mg        400 mg 250 mL/hr over 60 Minutes Intravenous Every 24 hours 03/10/11 1857              Best Practice/Protocols:  VTE Prophylaxis: Lovenox (prophylaxtic dose) since 03/10/11 >03/12/11 (till bleed in head ruled out and then resume) none  Consults:      Studies: Dg Chest 1 View  02/17/2011  *RADIOLOGY REPORT*   Clinical Data: Shortness of breath.  CHEST - 1 VIEW  Comparison: 02/15/2011, 12/04/2010 and 10/06/2010 chestx-ray. 12/04/2010 CT.  Findings: Pulmonary vascular congestion.  No pneumothorax.  Lung parenchymal changes more notable on the left. This may represent sequelae of prior infection superimposed upon chronic changes.  This patient had adenopathy on the prior chest CT, and follow-up CT imaging at such time was recommended in 3 - 6 months. When this follow-up CT is obtained than the lung parenchymal changes can be readdressed to exclude malignancy.  When compared to the most recent chest x-ray examination there has been no significant change.  IMPRESSION: No significant change since recent chest x-ray.  Follow up as discussed above.  Original Report Authenticated By: Fuller Canada, M.D.   Dg Chest 2 View  03/10/2011  *RADIOLOGY REPORT*  Clinical Data: Shortness of breath.  CHEST - 2 VIEW  Comparison: 03/04/2011 and CT from 02/18/2011  Findings: Two views of the chest were obtained.  Again noted is hyperinflation and chronic lung changes.  There are slightly increased densities in the left lung base and cannot exclude an acute process in this area.  Evidence for chronic changes in the  right upper lung.  Heart size is stable and within normal limits. The trachea is midline.  IMPRESSION: Chronic lung changes.  The patient has chronic changes at the left lung base but it is more conspicuous on today's examination. Difficult to exclude an acute process in this area.  Original Report Authenticated By: Richarda Overlie, M.D.   Dg Chest 2 View  03/04/2011  *RADIOLOGY REPORT*  Clinical Data: Shortness of breath  CHEST - 2 VIEW  Comparison: 02/25/2011  Findings: Coarse interstitial markings in the suprahilar regions and lung bases are stable.  No new infiltrate.  No effusion.  Heart size remains normal.  Regional bones unremarkable.  IMPRESSION:  Chronic parenchymal changes.  No acute disease.  Original Report Authenticated  By: Osa Craver, M.D.   Dg Chest 2 View  02/15/2011  *RADIOLOGY REPORT*  Clinical Data: Chest pain and shortness of breath.  CHEST - 2 VIEW  Comparison: 01/31/2011  Findings: Shallow inspiration.  Normal heart size and pulmonary vascularity.  Patchy interstitial changes in the lungs similar to previous study.  No developing airspace consolidation.  No blunting of costophrenic angles.  No pneumothorax.  Diffuse emphysematous changes.  Mild prominence of central pulmonary vascularity may represent pulmonary arterial hypertension.  Old right rib fracture. Degenerative changes in the thoracic spine and shoulders.  IMPRESSION: Stable patchy nodular areas of fibrosis in the lungs. Emphysematous changes.  Prominent central pulmonary vascularity suggesting pulmonary hypertension.  No active consolidation.  Original Report Authenticated By: Marlon Pel, M.D.   Ct Head Wo Contrast  02/17/2011  *RADIOLOGY REPORT*  Clinical Data: Fall and hit head.  Left-sided numbness.  CT HEAD WITHOUT CONTRAST  Technique:  Contiguous axial images were obtained from the base of the skull through the vertex without contrast.  Comparison: 12/03/2003  Findings: There is motion artifact at the cerebellum despite obtaining additional images.  No evidence for acute hemorrhage, mass lesion, midline shift, hydrocephalus or large infarct.  There is diffuse mucosal disease in the maxillary sinuses and ethmoid air cells.  Visualized mastoid air cells are clear.  IMPRESSION: No acute intracranial abnormality.  Limited evaluation at the skull base and cerebellum due to motion artifact.  Paranasal sinus disease.  Original Report Authenticated By: Richarda Overlie, M.D.   Ct Angio Chest W/cm &/or Wo Cm  02/18/2011  *RADIOLOGY REPORT*  Clinical Data: Chest pain for few days.  History of seizures. Schizophrenia.  Mental retardation.  COPD.  Hypertension. .  CT ANGIOGRAPHY CHEST  Technique:  Multidetector CT imaging of the chest using the  standard protocol during bolus administration of intravenous contrast. Multiplanar reconstructed images including MIPs were obtained and reviewed to evaluate the vascular anatomy.  Contrast: 80mL OMNIPAQUE IOHEXOL 350 MG/ML IV SOLN  Comparison: Plain film of 02/17/2011.  CT of 12/04/2010.  Findings: Lung windows demonstrate secretions within the dependent trachea.  Nonspecific bronchial wall thickening, primarily to the lower lobes.  Moderate centrilobular emphysema with biapical pleural parenchymal scarring.  A subpleural 4 mm right middle lobe nodule on image 98 was not present on the prior.  Calcified granuloma more inferiorly.  Scarring in the lung bases bilaterally.  Ill-defined defined nodular opacity in the superior segment right lower lobe measures 8 mm on image 69.  This was is not present on the prior exam.  Mildly spiculated and contacts the right major fissure.  Similar probable scarring in the posterior left upper lobe, image 35 - 50 of series 6.  An area of subpleural left upper lobe  interstitial opacity, including on image 61 is slightly improved and favored to represent an area of decreased infection.  Soft tissue windows:  The quality of this exam for evaluation of pulmonary embolism is good. No evidence of pulmonary embolism.  Bilateral gynecomastia.  Small stable left axillary nodes.  Normal aortic caliber without dissection.  Mild cardiomegaly with multivessel coronary artery atherosclerosis.  Trace bilateral pleural effusions.  Similar increased number of small mediastinal nodes.  Right hilar adenopathy 1.6 cm, similar to on the prior (when remeasured).  Left infrahilar nodal tissue measures up to 1.2 cm and is similar. The esophagus is mildly thick-walled.  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 More well-defined soft tissue density within the inferior aspect prevascular space.  This is contiguous with the thymus, measures 1.8 x 3.3 cm on image 75.  Similar to on the prior exam.   Limited abdominal imaging demonstrates no significant findings. Nonacute bilateral rib fractures.  Mild accentuation of expected thoracic kyphosis.  IMPRESSION:  1. No evidence of pulmonary embolism. 2.  Centrilobular emphysema with areas of scarring and probable resolving infection (i.e. left upper lobe). 3.  New spiculated 8 mm right lower lobe superior segment lung nodule.  Given development since 12/04/2010, favored to be infectious or inflammatory.  Consider antibiotic therapy and follow- up with chest CT of 3 months. 4.  Cardiomegaly with advanced coronary artery atherosclerosis and small bilateral pleural effusions. 5.  Esophageal wall thickening is mild and suggests esophagitis. 6.  Similar thoracic adenopathy.  Likely reactive. 7.  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  Inferiorly, this has a somewhat more mass-like appearance, which is similar to on the prior.  This can be reevaluated on follow-up chest CT.  Original Report Authenticated By: Consuello Bossier, M.D.   Dg Chest Port 1 View  02/25/2011  *RADIOLOGY REPORT*  Clinical Data: Shortness of breath, wheezing.  PORTABLE CHEST - 1 VIEW  Comparison: 02/17/2011  Findings: Hyperinflation of the lungs.  Heart is normal size. Chronic changes in the lungs.  No effusions.  No acute bony abnormality.  Evidence of old injury in the proximal left humerus.  IMPRESSION: Hyperinflation/chronic changes.  Original Report Authenticated By: Cyndie Chime, M.D.     Key Events: 03/10/2011  9:10 AM admited for AECOPD 03/12/2011 -seizures x 4,. Transfferred to ICU.PCCM now primary      History of Present Illness: Hx from chart and Dr Verta Ellen and Dr Roseanne Reno of neurology  56 year old male with prior smoking hx and dx of copd nos and lives in group home. Per chart prior hx of seizures but at time of admission no details on this was elicitable including med list due to care giver lack of knowledge and patient being poor historian due to mental  retartdation and schizo affective state. ADmission diagnosis 03/10/2011 was AECOPD. Patient being prepared for discharge 03/12/2011 but suddenly developed focal rt sideded seizures per DR Roseanne Reno (witnessed) and GTC per Dr Izola Price (witness) and needed repeated ativan. Finally given keppra load and moved to ICU. No witnessed aspiration but noted to be wheezing (? Did wheeze improve after admission). Concern for airway protection need but at time of ICU transfer patient had intact mental status and protecting airway and commuinicative. PCCM being primary service in ICU      Allergies: Peanut-containing drug products; Coffee bean extract; Prolixin; Thorazine; and Lactose intolerance (gi)  Past Medical History  Diagnosis Date  . Seizures   . Psychiatric disorder   . Schizoaffective disorder   .  Organic mood disorder   . Intermittent explosive disorder   . Generalized anxiety disorder   . Mild mental retardation   . COPD (chronic obstructive pulmonary disease)   . Hypertension   . Peptic ulcer   . Thyroid disease   . Prostate hyperplasia with urinary obstruction   . Blind left eye     History reviewed. No pertinent past surgical history.  Family History  Problem Relation Age of Onset  . Hypertension      History   Social History  . Marital Status: Single    Spouse Name: N/A    Number of Children: N/A  . Years of Education: N/A   Occupational History  . Not on file.   Social History Main Topics  . Smoking status: Current Everyday Smoker -- 0.2 packs/day    Types: Cigarettes  . Smokeless tobacco: Never Used  . Alcohol Use: No  . Drug Use: No  . Sexually Active:    Other Topics Concern  . Not on file   Social History Narrative   ** Merged History Encounter **    Prior to Admission medications   Medication Sig Start Date End Date Taking? Authorizing Provider  albuterol-ipratropium (COMBIVENT) 18-103 MCG/ACT inhaler Inhale 2 puffs into the lungs every 6 (six) hours as  needed. Shortness of breath.    Yes Historical Provider, MD  amLODipine (NORVASC) 10 MG tablet Take 10 mg by mouth daily.     Yes Historical Provider, MD  diphenhydrAMINE (BENADRYL) 50 MG capsule Take 50 mg by mouth at bedtime as needed. For insomnia   Yes Historical Provider, MD  docusate sodium (COLACE) 100 MG capsule Take 100 mg by mouth 2 (two) times daily.   Yes Historical Provider, MD  fluticasone (FLOVENT HFA) 44 MCG/ACT inhaler Inhale 2 puffs into the lungs 2 (two) times daily. 02/05/11 02/05/12 Yes Vassie Loll, MD  levothyroxine (SYNTHROID, LEVOTHROID) 75 MCG tablet Take 75 mcg by mouth daily.     Yes Historical Provider, MD  lisinopril (PRINIVIL,ZESTRIL) 5 MG tablet Take 5 mg by mouth daily.     Yes Historical Provider, MD  lithium carbonate 300 MG capsule Take 300 mg by mouth 3 (three) times daily with meals.   Yes Historical Provider, MD  omeprazole (PRILOSEC) 20 MG capsule Take 20 mg by mouth 2 (two) times daily.     Yes Historical Provider, MD  potassium chloride (KLOR-CON) 20 MEQ packet Take 20 mEq by mouth daily.     Yes Historical Provider, MD  risperiDONE (RISPERDAL) 1 MG tablet Take 1 mg by mouth every morning.  02/13/11 03/15/11 Yes Kathlen Mody, MD  risperiDONE (RISPERDAL) 2 MG tablet Take 2 mg by mouth at bedtime.   Yes Historical Provider, MD  Tamsulosin HCl (FLOMAX) 0.4 MG CAPS Take 0.4 mg by mouth daily after supper. 02/05/11  Yes Vassie Loll, MD  traZODone (DESYREL) 100 MG tablet Take 100 mg by mouth at bedtime.     Yes Historical Provider, MD  haloperidol (HALDOL) 2 MG tablet Take 2 mg by mouth every 6 (six) hours as needed. agitation    Historical Provider, MD  moxifloxacin (AVELOX) 400 MG tablet Take 1 tablet (400 mg total) by mouth daily at 6 PM. 03/12/11 03/22/11  Dorothea Ogle, MD  predniSONE (DELTASONE) 50 MG tablet Take 50 mg tablet today and taper down by 10 mg per day until completed 03/12/11 03/22/11  Dorothea Ogle, MD        Review of Systems: Per HPI  Otherwise 11  point ROS negative   Physical Exam:  Filed Vitals:   03/12/11 0500 03/12/11 0846 03/12/11 1333 03/12/11 1416  BP: 128/73  146/83   Pulse: 75  77   Temp: 98.7 F (37.1 C)  97.9 F (36.6 C)   TempSrc:   Oral   Resp: 18  18   Weight:      SpO2: 94% 96% 93% 96%    Gen: Ill looking male. No obvious distress.   ENT: No lesions,  mouth clear,  oropharynx clear, no postnasal drip  Neck: No JVD, no TMG, no carotid bruits  Chest: BARRELL CHEST +  Lungs: No use of accessory muscles, but bilateral significant wheeze +  Cardiovascular: RRR, heart sounds normal, no murmur or gallops, no peripheral edema  Abdomen: soft and NT, no HSM,  BS normal  Musculoskeletal: No deformities, no cyanosis or clubbing  Neuro: alert, non focal and gag +. RASS 0. Speaks with a lisp. Hard to understand. Low IQ per report  Skin: Warm, no lesions or rashes  Labs  No results found for this basename: PROCALCITON:5 in the last 168 hours  Lab 03/10/11 1253  PROBNP 52.5    Lab 03/10/11 0930  AST 15  ALT 11  ALKPHOS 72  BILITOT 0.4  PROT 7.1  ALBUMIN 3.6  INR --    Lab 03/12/11 0642 03/11/11 0656 03/10/11 1253  HGB 11.0* 10.6* 11.5*  HCT 34.3* 33.1* 36.4*  WBC 12.2* 11.5* 9.6  PLT 214 198 234    Lab 03/10/11 1253  PROBNP 52.5    Lab 03/12/11 0642 03/11/11 0656 03/10/11 1253 03/10/11 0930  NA 142 142 -- 142  K 4.0 3.9 -- --  CL 108 107 -- 106  CO2 27 28 -- 28  GLUCOSE 135* 101* -- 119*  BUN 14 11 -- 10  CREATININE 0.74 0.85 0.94 0.84  CALCIUM 10.0 9.6 -- 9.8  MG -- -- 2.1 --  PHOS -- -- 1.8* --   Dg Chest 2 View  03/10/2011  *RADIOLOGY REPORT*  Clinical Data: Shortness of breath.  CHEST - 2 VIEW  Comparison: 03/04/2011 and CT from 02/18/2011  Findings: Two views of the chest were obtained.  Again noted is hyperinflation and chronic lung changes.  There are slightly increased densities in the left lung base and cannot exclude an acute process in this area.  Evidence for chronic  changes in the right upper lung.  Heart size is stable and within normal limits. The trachea is midline.  IMPRESSION: Chronic lung changes.  The patient has chronic changes at the left lung base but it is more conspicuous on today's examination. Difficult to exclude an acute process in this area.  Original Report Authenticated By: Richarda Overlie, M.D.    Lab 03/12/11 1820  PHART 7.404  PCO2ART 43.9  PO2ART 67.4*  HCO3 26.9*  TCO2 28.2  O2SAT 93.7     Assessment & Plan   Patient Active Hospital Problem List: Seizure-like activity (02/09/2011)   Assessment: Witnessed seizures. Likely due to prior untreated state. ? curernt meds and illness lowered threshold.    Plan: CT head, Rest per neuro, Check sepsis labs. Hold off antibiotics currently but check sepsis biomarkers and lytes. Land. Keep NPO  COPD with acute exacerbation (02/01/2011)   Assessment: Actively wheezing. ABG ok. Noted to be on ace inhibitor   Plan: Increase IV steroid dose, increase neb frequency. Monitor closely. No indication for bipap or intubation curently. DC ace inhibitor  Schizoaffective  disorder (02/01/2011)   Assessment: B[facility]eline home issue   Plan: ? Needs psych eval as inpatient prior to dc  Mental retardation, mild (I.Q. 50-70) (02/01/2011)   Assessment: BAseline home issue   Plan: Needs social situation assessed prior to dc  Hypothyroidism (02/01/2011)   Assessment: Baseline home issue   Plan: Contnue synthroids  HTN (hypertension) (02/09/2011)   Assessment: Baseline home issue but on ACE inhibitor   Plan: DC aceinhibitor due to presence of copd      Dr. Kalman Shan, M.D., [facility].C.P Pulmonary and Critical Care Medicine Staff Physician [facility] [place] [facility] Pager: [phone], If no answer or between  15:00h - 7:00h: call [phone]  03/12/2011 7:01 PM

## 2011-03-13 ENCOUNTER — Inpatient Hospital Stay (HOSPITAL_COMMUNITY): Payer: Medicare Other

## 2011-03-13 DIAGNOSIS — R569 Unspecified convulsions: Secondary | ICD-10-CM

## 2011-03-13 LAB — COMPREHENSIVE METABOLIC PANEL
Albumin: 3.2 g/dL — ABNORMAL LOW (ref 3.5–5.2)
Alkaline Phosphatase: 63 U/L (ref 39–117)
BUN: 14 mg/dL (ref 6–23)
Calcium: 9.6 mg/dL (ref 8.4–10.5)
Creatinine, Ser: 0.7 mg/dL (ref 0.50–1.35)
GFR calc Af Amer: >90 mL/min (ref >90)
Glucose, Bld: 146 mg/dL — ABNORMAL HIGH (ref 70–99)
Potassium: 4.1 mEq/L (ref 3.5–5.1)
Total Protein: 6.3 g/dL (ref 6.0–8.3)

## 2011-03-13 LAB — GLUCOSE, CAPILLARY

## 2011-03-13 MED ORDER — SODIUM CHLORIDE 0.9 % IV SOLN
1000.0000 mg | Freq: Two times a day (BID) | INTRAVENOUS | Status: DC
  Administered 2011-03-13 – 2011-03-14 (×4): 1000 mg via INTRAVENOUS
  Filled 2011-03-13 (×8): qty 10

## 2011-03-13 MED ORDER — SODIUM CHLORIDE 0.9 % IV SOLN
1250.0000 mg | Freq: Once | INTRAVENOUS | Status: AC
  Administered 2011-03-13: 1250 mg via INTRAVENOUS
  Filled 2011-03-13: qty 25

## 2011-03-13 MED ORDER — SODIUM CHLORIDE 0.9 % IV SOLN
500.0000 mg | Freq: Once | INTRAVENOUS | Status: AC
  Administered 2011-03-13: 500 mg via INTRAVENOUS
  Filled 2011-03-13: qty 5

## 2011-03-13 MED ORDER — LORAZEPAM 2 MG/ML IJ SOLN
INTRAMUSCULAR | Status: AC
  Filled 2011-03-13: qty 1

## 2011-03-13 MED ORDER — PHENYTOIN SODIUM 50 MG/ML IJ SOLN
100.0000 mg | Freq: Three times a day (TID) | INTRAMUSCULAR | Status: DC
  Administered 2011-03-13 – 2011-03-15 (×6): 100 mg via INTRAVENOUS
  Filled 2011-03-13 (×11): qty 2

## 2011-03-13 NOTE — Progress Notes (Signed)
Rn called to bedside by sitter. Sitter reports that patient stated " Im having a stroke" and then became unresponsive. Patient will not respond verbally to command.  Patient flacid in bilateral upper extremities and flickering in bilateral lower extremities. Patient has left sided gaze. Right pupil reactive to light. Left pupil is very sluggish. MD notified. Stat CT of head ordered. Will continue to monitor.

## 2011-03-13 NOTE — Progress Notes (Addendum)
HPI:  56 year old male with prior smoking hx and dx of copd nos and lives in group home. Per chart prior hx of seizures but at time of admission no details on this was elicitable including med list due to care giver lack of knowledge and patient being poor historian due to mental retartdation and schizo affective state. ADmission diagnosis 03/10/2011 was AECOPD. Patient being prepared for discharge 03/12/2011 but suddenly developed focal rt sideded seizures per DR Roseanne Reno (witnessed) and GTC per Dr Izola Price (witness) and needed repeated ativan. Finally given keppra load and moved to ICU. No witnessed aspiration but noted to be wheezing (? Did wheeze improve after admission). Concern for airway protection need but at time of ICU transfer patient had intact mental status and protecting airway and commuinicative. PCCM being primary service in ICU  Antibiotics:   Avelox 3/11>>>  Cultures/Sepsis Markers:   MRSA swab +  Access/Protocols:  PIV  Best Practice: DVT: SCD's GI: Protonix  Subjective: Frequent short seizures throughout the morning.  Physical Exam: Filed Vitals:   03/13/11 0900  BP: 141/85  Pulse: 85  Temp:   Resp: 22    Intake/Output Summary (Last 24 hours) at 03/13/11 1122 Last data filed at 03/13/11 0900  Gross per 24 hour  Intake    280 ml  Output   2225 ml  Net  -1945 ml   Vent Mode:  [-]  FiO2 (%):  [36 %] 36 %  Neuro: confused but moving all ext to command Cardiac: RRR, Nl S1/S2, -M/R/G. Pulmonary: Diffuse end exp wheezes. GI: Soft, NT, ND and +BS. Extremities: -edema and -tenderness.  Labs: CBC    Component Value Date/Time   WBC 11.9* 03/12/2011 2031   RBC 4.07* 03/12/2011 2031   HGB 10.6* 03/12/2011 2031   HCT 33.1* 03/12/2011 2031   PLT 221 03/12/2011 2031   MCV 81.3 03/12/2011 2031   MCH 26.0 03/12/2011 2031   MCHC 32.0 03/12/2011 2031   RDW 15.1 03/12/2011 2031   LYMPHSABS 1.5 03/10/2011 0930   MONOABS 0.9 03/10/2011 0930   EOSABS 1.2* 03/10/2011 0930   BASOSABS  0.1 03/10/2011 0930   BMET    Component Value Date/Time   NA 144 03/13/2011 0410   K 4.1 03/13/2011 0410   CL 109 03/13/2011 0410   CO2 29 03/13/2011 0410   GLUCOSE 146* 03/13/2011 0410   BUN 14 03/13/2011 0410   CREATININE 0.70 03/13/2011 0410   CALCIUM 9.6 03/13/2011 0410   GFRNONAA >90 03/13/2011 0410   GFRAA >90 03/13/2011 0410   ABG    Component Value Date/Time   PHART 7.404 03/12/2011 1820   PCO2ART 43.9 03/12/2011 1820   PO2ART 67.4* 03/12/2011 1820   HCO3 26.9* 03/12/2011 1820   TCO2 28.2 03/12/2011 1820   O2SAT 93.7 03/12/2011 1820     Lab 03/12/11 2031  MG 2.1   Lab Results  Component Value Date   CALCIUM 9.6 03/13/2011   PHOS 4.1 03/12/2011    Chest Xray:   Assessment & Plan: Seizure-like activity (02/09/2011) Assessment: Witnessed seizures. Likely due to prior untreated state. ? curernt meds and illness lowered threshold.  Plan:  CT head negative  Rest per neuro  Maintain sitter.   Keep NPO  COPD with acute exacerbation (02/01/2011) Assessment: Actively wheezing. ABG ok. Plan:  Increased IV steroid dose  Increased neb frequency.   Monitor closely.   No indication for bipap or intubation curently.  Schizoaffective disorder (02/01/2011) Assessment: BAseline home issue Plan: ? Needs psych  eval as inpatient prior to dc  Mental retardation, mild (I.Q. 50-70) (02/01/2011) Assessment: BAseline home issue Plan: Needs social situation assessed prior to dc  Hypothyroidism (02/01/2011) Assessment: Baseline home issue Plan:  Contnue synthroids  Check TSH and free T4.  HTN (hypertension) (02/09/2011) Assessment: Baseline home issue and BP appears stable for now. Plan:  Continue current management.  Leukocytosis: due to steroids, avelox only for airway sterilization for COPD, will continue for 5 days, if stable by AM will begin decreasing steroids.  Will maintain in the ICU for now until seizure activity is under control via neuro's help.  Koren Bound, MD [phone]

## 2011-03-13 NOTE — Procedures (Signed)
EEG NUMBER:  [id number]  This routine EEG was requested in this 56 year old man with a history of schizophrenia admitted with shortness of breath.  The patient had several witnessed episodes this a.m. when his head was turned to the right and he had right arm flapping.  He also has had a recent unresponsive episode.  Medications include levetiracetam, haloperidol, lorazepam and risperidone.  The EEG was done with the patient, awake.  During periods of maximal wakefulness, background activities were composed of a mixture of alpha and beta activities, that were not well organized.  In addition, there was noted to be mainly intermittent theta range slowing, most often seen over the left frontal central regions, but frequently extending to the vertex.  Sharp waves were seen in the left central, frontal central region.  Photic stimulation did not bring about a driving response. Hyperventilation was not performed.  The patient did become drowsy as characterized by attenuation of muscle activity and the onset of slow roving eye movements.  There were bursts of slower activities that did appear to be symmetric and in the theta range.  CLINICAL INTERPRETATION:  This routine EEG done with the patient awake and drowsy is abnormal.  The presence of left frontal central slowing combined with sharp waves in the same region suggest a possible source of seizures.  No electrographic seizures, nor nonconvulsive status epilepticus were seen.          ______________________________ Denton Meek, MD    [id number] D:  03/13/2011 04:54:09  T:  03/13/2011 22:50:07  Job #:  [id number]

## 2011-03-13 NOTE — Progress Notes (Signed)
Subjective: Patient continues to have occasional brief focal motor seizure activity the spells lasting only a few seconds, and lucid afterwards. He is tolerating Keppra with no side effects. CT of his head showed no acute intracranial abnormality.  Objective: Current vital signs: BP 126/73  Pulse 83  Temp(Src) 97.7 F (36.5 C) (Oral)  Resp 24  Wt 69.5 kg (153 lb 3.5 oz)  SpO2 100%  Neurologic Exam: Patient was alert and in no distress. No focal abnormalities were noted. He moves extremities equally. Speech was dysarthric, largely due to edentulous state. No episodes of seizures occurred during the evaluation.  Studies/Results: Ct Head Wo Contrast  03/12/2011  *RADIOLOGY REPORT*  Clinical Data: Code stroke,  CT HEAD WITHOUT CONTRAST  Technique:  Contiguous axial images were obtained from the base of the skull through the vertex without contrast.  Comparison: 02/17/2011  Findings: Images are degraded by unconventional positioning. Within this limitation, There is no evidence for acute hemorrhage, hydrocephalus, mass lesion, or abnormal extra-axial fluid collection.  No definite CT evidence for acute infarction.  Right maxillary sinus and to a lesser extent left maxillary sinus mucosal thickening.  Partially opacified ethmoid air cells. visualized paranasal sinuses and mastoid air cells are predominately clear.  IMPRESSION: No acute intracranial abnormality. Discussed via telephone with the patient's nurse, Whitney, at 10:10 p.m. on 03/12/2011.  Partially opacified ethmoid air cells.  Correlate clinically if concerned for sinusitis.  Original Report Authenticated By: Waneta Martins, M.D.    Medications:  Scheduled:   . albuterol  2.5 mg Nebulization Q2H  . amLODipine  10 mg Oral Daily  . docusate sodium  100 mg Oral BID  . ipratropium  0.5 mg Nebulization Q4H  . levetiracetam  1,000 mg Intravenous STAT  . levetiracetam  1,000 mg Intravenous Q12H  . levetiracetam  500 mg Intravenous  Once  . levothyroxine  75 mcg Oral Q breakfast  . lithium carbonate  300 mg Oral TID WC  . LORazepam      . LORazepam  2 mg Intravenous Once  . methylPREDNISolone (SOLU-MEDROL) injection  80 mg Intravenous Q8H  . pantoprazole  40 mg Oral Daily  . risperiDONE  1 mg Oral Daily  . risperiDONE  2 mg Oral QHS  . sodium chloride  3 mL Intravenous Q12H  . sodium chloride  3 mL Intravenous Q12H  . Tamsulosin HCl  0.4 mg Oral PC supper  . DISCONTD: albuterol  2.5 mg Nebulization Q6H  . DISCONTD: enoxaparin  40 mg Subcutaneous Q24H  . DISCONTD: fluticasone  2 puff Inhalation BID  . DISCONTD: guaiFENesin  600 mg Oral BID  . DISCONTD: ipratropium  0.5 mg Nebulization Q6H  . DISCONTD: levetiracetam  500 mg Intravenous Q12H  . DISCONTD: lisinopril  5 mg Oral Daily  . DISCONTD: methylPREDNISolone (SOLU-MEDROL) injection  40 mg Intravenous BID  . DISCONTD: moxifloxacin  400 mg Intravenous Q24H    Assessment/Plan: 1. Recurrent breakthrough focal motor seizures. Severity of seizure activity since markedly diminished, as well as frequency, with Keppra 500 mg every 12 hours. 2. Mental retardation, mild to moderate. 3. History of schizoaffective disorder  Plan: 1. Bolus of Keppra IV 500 mg this morning. 2. Increase in maintenance Keppra 2 1000 mg every 12 hours. 3. No further neurodiagnostic studies are indicated.  C.R. Roseanne Reno, MD [facility] [phone]  03/13/2011  8:57 AM

## 2011-03-13 NOTE — Progress Notes (Signed)
At 20:00 pt had brief seizure activity in arms, followed by 3 to 4 minutes of unresponsiveness, during which vitals signs remained essentially unchanged, with respiratory rate slightly increased.  Pt then returned to baseline.  CCM MD notified.  Neurology MD notified.

## 2011-03-13 NOTE — Progress Notes (Signed)
Patient is having seizure activity with MD at bedside. Seizures are < 30 sec in duration.  Keppra ordered and given. Will continue to monitor.

## 2011-03-13 NOTE — Progress Notes (Signed)
Patient continues to have seizures with RN bedside. Seizures are approx every 5 minutes and 30 seconds in duration. MD notified; Dilantin ordered. Will give asap.

## 2011-03-13 NOTE — Progress Notes (Signed)
CSW received referral for this pt admitted from [facility] group home. CSW spoke with group home manager who states pt has history of faking seizures in order to avoid d/c from hospital (x5 in the past 3 months). Pt legal gaurdian, apparently is Lonell Grandchild, contact [phone]. Baxter Flattery, MSW [phone]

## 2011-03-13 NOTE — Progress Notes (Signed)
UR Completed.  [name] Jane [phone] 03/13/2011  

## 2011-03-14 ENCOUNTER — Other Ambulatory Visit (HOSPITAL_COMMUNITY): Payer: Self-pay

## 2011-03-14 LAB — CBC
HCT: 36.2 % — ABNORMAL LOW (ref 39.0–52.0)
Hemoglobin: 11.4 g/dL — ABNORMAL LOW (ref 13.0–17.0)
MCH: 25.8 pg — ABNORMAL LOW (ref 26.0–34.0)
MCHC: 31.5 g/dL (ref 30.0–36.0)
MCV: 81.9 fL (ref 78.0–100.0)

## 2011-03-14 LAB — BASIC METABOLIC PANEL
BUN: 12 mg/dL (ref 6–23)
Calcium: 10.1 mg/dL (ref 8.4–10.5)
Creatinine, Ser: 0.64 mg/dL (ref 0.50–1.35)
GFR calc non Af Amer: >90 mL/min (ref >90)
Glucose, Bld: 138 mg/dL — ABNORMAL HIGH (ref 70–99)

## 2011-03-14 LAB — ALBUMIN: Albumin: 3.2 g/dL — ABNORMAL LOW (ref 3.5–5.2)

## 2011-03-14 LAB — PHENYTOIN LEVEL, TOTAL: Phenytoin Lvl: 19.9 ug/mL (ref 10.0–20.0)

## 2011-03-14 MED ORDER — ALBUTEROL SULFATE (5 MG/ML) 0.5% IN NEBU
2.5000 mg | INHALATION_SOLUTION | Freq: Four times a day (QID) | RESPIRATORY_TRACT | Status: DC
  Administered 2011-03-14 – 2011-03-15 (×3): 2.5 mg via RESPIRATORY_TRACT
  Filled 2011-03-14 (×4): qty 0.5

## 2011-03-14 MED ORDER — IPRATROPIUM BROMIDE 0.02 % IN SOLN
0.5000 mg | Freq: Four times a day (QID) | RESPIRATORY_TRACT | Status: DC
  Administered 2011-03-14 – 2011-03-15 (×3): 0.5 mg via RESPIRATORY_TRACT
  Filled 2011-03-14 (×4): qty 2.5

## 2011-03-14 MED ORDER — MUPIROCIN CALCIUM 2 % EX CREA
TOPICAL_CREAM | Freq: Two times a day (BID) | CUTANEOUS | Status: DC
  Administered 2011-03-14 – 2011-03-16 (×4): via TOPICAL
  Filled 2011-03-14: qty 15

## 2011-03-14 MED ORDER — METHYLPREDNISOLONE SODIUM SUCC 40 MG IJ SOLR
40.0000 mg | Freq: Two times a day (BID) | INTRAMUSCULAR | Status: DC
  Administered 2011-03-14 – 2011-03-15 (×2): 40 mg via INTRAVENOUS
  Filled 2011-03-14 (×4): qty 1

## 2011-03-14 MED ORDER — ALBUTEROL SULFATE (5 MG/ML) 0.5% IN NEBU: 2.5000 mg | INHALATION_SOLUTION | RESPIRATORY_TRACT | Status: DC | PRN

## 2011-03-14 MED ORDER — CHLORHEXIDINE GLUCONATE CLOTH 2 % EX PADS
6.0000 | MEDICATED_PAD | Freq: Every day | CUTANEOUS | Status: DC
  Administered 2011-03-15: 6 via TOPICAL

## 2011-03-14 MED ORDER — POTASSIUM CHLORIDE 20 MEQ/15ML (10%) PO LIQD
40.0000 meq | Freq: Every day | ORAL | Status: AC
  Administered 2011-03-14: 40 meq
  Filled 2011-03-14: qty 30

## 2011-03-14 NOTE — Progress Notes (Signed)
[facility] PROGRESS NOTE    SUBJECTIVE   Patient is alert and very agitated when I walked into the room.  He has garbled speech and stating he is itching.   OBJECTIVE   Vital signs in last 24 hours: Temp:  [97.5 F (36.4 C)-98.7 F (37.1 C)] 97.5 F (36.4 C) (03/13 0400) Pulse Rate:  [64-113] 84  (03/13 0700) Resp:  [14-26] 22  (03/13 0700) BP: (112-153)/(57-102) 144/77 mmHg (03/13 0700) SpO2:  [90 %-100 %] 100 % (03/13 0809) Weight:  [69.5 kg (153 lb 3.5 oz)] 69.5 kg (153 lb 3.5 oz) (03/12 1400)  Intake/Output from previous day: 03/12 0701 - 03/13 0700 In: 2054 [P.O.:1470; I.V.:20; IV Piggyback:564] Out: 4250 [Urine:4250] Intake/Output this shift:   Nutritional status: Clear Liquid  Past Medical History  Diagnosis Date  . Seizures   . Psychiatric disorder   . Schizoaffective disorder   . Organic mood disorder   . Intermittent explosive disorder   . Generalized anxiety disorder   . Mild mental retardation   . COPD (chronic obstructive pulmonary disease)   . Hypertension   . Peptic ulcer   . Thyroid disease   . Prostate hyperplasia with urinary obstruction   . Blind left eye     Neurologic Exam:   Mental Status: Alert, not oriented,.  Speech garbled and only will follow a few of my commands.   Cranial Nerves: II-Visual fields intact on the right but shows a left hemianopsia. III/IV/VI-Extraocular movements intact.  Pupils reactive bilaterally with a ADP in the left V/VII-Smile symmetric VIII-grossly intact IX/X-normal gag XI-bilateral shoulder shrug XII-midline tongue extension Motor: 5/5 bilaterally with normal tone and bulk moving all extremities antigravity Sensory: Pinprick and light touch intact throughout, bilaterally Deep Tendon Reflexes: 2+ and symmetric throughout Plantars: mute bilaterally  Lab Results: Lab Results  Component Value Date/Time   CHOL 150 02/01/2011 10:10 PM   Lipid Panel No  results found for this basename: CHOL,TRIG,HDL,CHOLHDL,VLDL,LDLCALC in the last 72 hours  Studies/Results: Ct Head Wo Contrast  03/13/2011  *RADIOLOGY REPORT*  Clinical Data: History of seizures today.  Mental status change.  CT HEAD WITHOUT CONTRAST  Technique:  Contiguous axial images were obtained from the base of the skull through the vertex without contrast.  Comparison: Head CT dated 03/12/2011.  Findings: Study is slightly limited by patient motion.  However, no definite acute intracranial abnormalities are noted.  Specifically, no definite evidence of acute/subacute ischemia, focal mass, mass effect, hydrocephalus or abnormal intra or extra-axial fluid collections.  No displaced skull fractures are noted.  Visualized paranasal sinuses and mastoids are remarkable for areas of mucosal thickening throughout the ethmoid and maxillary sinuses bilaterally, without air fluid levels.  IMPRESSION: 1.  No acute intracranial abnormalities. 2.  Changes suggestive of chronic sinusitis redemonstrated, as above.  Original Report Authenticated By: Florencia Reasons, M.D.   Ct Head Wo Contrast  03/12/2011  *RADIOLOGY REPORT*  Clinical Data: Code stroke,  CT HEAD WITHOUT CONTRAST  Technique:  Contiguous axial images were obtained from the base of the skull through the vertex without contrast.  Comparison: 02/17/2011  Findings: Images are degraded by unconventional positioning. Within this limitation, There is no evidence for acute hemorrhage, hydrocephalus, mass lesion, or abnormal extra-axial fluid collection.  No definite CT evidence  for acute infarction.  Right maxillary sinus and to a lesser extent left maxillary sinus mucosal thickening.  Partially opacified ethmoid air cells. visualized paranasal sinuses and mastoid air cells are predominately clear.  IMPRESSION: No acute intracranial abnormality. Discussed via telephone with the patient's nurse, Whitney, at 10:10 p.m. on 03/12/2011.  Partially opacified ethmoid  air cells.  Correlate clinically if concerned for sinusitis.  Original Report Authenticated By: Waneta Martins, M.D.   EEG-This routine EEG done with the patient awake  and drowsy is abnormal. The presence of left frontal central slowing  combined with sharp waves in the same region suggest an underlying  possible source of seizures. No electrographic seizures, nor  nonconvulsive status epilepticus were seen.  Medications:     Scheduled:   . albuterol  2.5 mg Nebulization Q2H  . amLODipine  10 mg Oral Daily  . docusate sodium  100 mg Oral BID  . ipratropium  0.5 mg Nebulization Q4H  . levetiracetam  1,000 mg Intravenous Q12H  . levothyroxine  75 mcg Oral Q breakfast  . lithium carbonate  300 mg Oral TID WC  . LORazepam      . methylPREDNISolone (SOLU-MEDROL) injection  80 mg Intravenous Q8H  . pantoprazole  40 mg Oral Daily  . phenytoin (DILANTIN) IV  1,250 mg Intravenous Once  . phenytoin (DILANTIN) IV  100 mg Intravenous Q8H  . risperiDONE  1 mg Oral Daily  . risperiDONE  2 mg Oral QHS  . sodium chloride  3 mL Intravenous Q12H  . sodium chloride  3 mL Intravenous Q12H  . Tamsulosin HCl  0.4 mg Oral PC supper    Assessment/Plan:    Assessment/Plan:  1. Recurrent breakthrough focal motor seizures.  2. Mental retardation, mild to moderate.  3. History of schizoaffective disorder 4. Call with questions  Continue his currently on Keppra 1 gram BID and Dilantin 100 mg every 8 hours.     Felicie Morn PA-C [facility] [phone]  03/14/2011, 10:31 AM

## 2011-03-14 NOTE — Progress Notes (Signed)
HPI:  56 year old male with prior smoking hx and dx of copd nos and lives in group home. Per chart prior hx of seizures but at time of admission no details on this was elicitable including med list due to care giver lack of knowledge and patient being poor historian due to mental retartdation and schizo affective state. ADmission diagnosis 03/10/2011 was AECOPD. Patient being prepared for discharge 03/12/2011 but suddenly developed focal rt sideded seizures per DR Roseanne Reno (witnessed) and GTC per Dr Izola Price (witness) and needed repeated ativan. Finally given keppra load and moved to ICU. No witnessed aspiration but noted to be wheezing (? Did wheeze improve after admission). Concern for airway protection need but at time of ICU transfer patient had intact mental status and protecting airway and commuinicative. PCCM being primary service in ICU  Antibiotics:   Avelox 3/11>>>  Cultures/Sepsis Markers:   MRSA swab +  Access/Protocols:  PIV  Best Practice: DVT: SCD's GI: Protonix  Subjective: Frequent short seizures throughout the morning.  Physical Exam: Filed Vitals:   03/14/11 0700  BP: 144/77  Pulse: 84  Temp:   Resp: 22    Intake/Output Summary (Last 24 hours) at 03/14/11 1115 Last data filed at 03/14/11 0537  Gross per 24 hour  Intake   1834 ml  Output   3350 ml  Net  -1516 ml   Neuro: confused but moving all ext to command Cardiac: RRR, Nl S1/S2, -M/R/G. Pulmonary: Diffuse end exp wheezes. GI: Soft, NT, ND and +BS. Extremities: -edema and -tenderness.  Labs: CBC    Component Value Date/Time   WBC 12.5* 03/14/2011 0455   RBC 4.42 03/14/2011 0455   HGB 11.4* 03/14/2011 0455   HCT 36.2* 03/14/2011 0455   PLT 225 03/14/2011 0455   MCV 81.9 03/14/2011 0455   MCH 25.8* 03/14/2011 0455   MCHC 31.5 03/14/2011 0455   RDW 15.3 03/14/2011 0455   LYMPHSABS 1.5 03/10/2011 0930   MONOABS 0.9 03/10/2011 0930   EOSABS 1.2* 03/10/2011 0930   BASOSABS 0.1 03/10/2011 0930   BMET    Component  Value Date/Time   NA 141 03/14/2011 0455   K 3.7 03/14/2011 0455   CL 105 03/14/2011 0455   CO2 26 03/14/2011 0455   GLUCOSE 138* 03/14/2011 0455   BUN 12 03/14/2011 0455   CREATININE 0.64 03/14/2011 0455   CALCIUM 10.1 03/14/2011 0455   GFRNONAA >90 03/14/2011 0455   GFRAA >90 03/14/2011 0455   ABG    Component Value Date/Time   PHART 7.404 03/12/2011 1820   PCO2ART 43.9 03/12/2011 1820   PO2ART 67.4* 03/12/2011 1820   HCO3 26.9* 03/12/2011 1820   TCO2 28.2 03/12/2011 1820   O2SAT 93.7 03/12/2011 1820    Lab 03/14/11 0455  MG 2.0   Lab Results  Component Value Date   CALCIUM 10.1 03/14/2011   PHOS 4.1 03/14/2011    Chest Xray:   Assessment & Plan: Seizure-like activity (02/09/2011) Assessment: Witnessed seizures. Likely due to prior untreated state. ? curernt meds and illness lowered threshold.  Plan:  CT head negative.  Rest per neuro.  Land.   Regular diet.  COPD with acute exacerbation (02/01/2011) Assessment: Actively wheezing. ABG ok. Plan:  Decreased IV steroid dose, would continue for 5 days of total steroids.  Increased neb frequency.   Monitor closely.   No indication for bipap or intubation curently.  Avelox started, would continue for five days and may change to PO whenever.  Schizoaffective disorder (02/01/2011) Assessment:  B[place]eline home issue Plan: ? Needs psych eval as inpatient prior to dc.  Mental retardation, mild (I.Q. 50-70) (02/01/2011) Assessment: BAseline home issue Plan: Needs social situation assessed prior to dc  Hypothyroidism (02/01/2011) Assessment: Baseline home issue Plan:  Contnue synthroids  Check TSH and free T4.  HTN (hypertension) (02/09/2011) Assessment: Baseline home issue and BP appears stable for now. Plan:  Continue current management.  Leukocytosis: due to steroids, avelox only for airway sterilization for COPD, will continue for 5 days, if stable by AM will begin decreasing steroids.  Transfer to SDU and back to [facility]  service.  Koren Bound, MD [phone]

## 2011-03-15 ENCOUNTER — Encounter (HOSPITAL_COMMUNITY): Payer: Self-pay

## 2011-03-15 LAB — CBC
HCT: 36.6 % — ABNORMAL LOW (ref 39.0–52.0)
Hemoglobin: 11.7 g/dL — ABNORMAL LOW (ref 13.0–17.0)
MCH: 26.3 pg (ref 26.0–34.0)
MCHC: 32 g/dL (ref 30.0–36.0)
MCV: 82.2 fL (ref 78.0–100.0)
Platelets: 252 10*3/uL (ref 150–400)
RBC: 4.45 MIL/uL (ref 4.22–5.81)
RDW: 15.4 % (ref 11.5–15.5)
WBC: 12.5 10*3/uL — ABNORMAL HIGH (ref 4.0–10.5)

## 2011-03-15 LAB — BASIC METABOLIC PANEL
CO2: 28 mEq/L (ref 19–32)
Calcium: 9.8 mg/dL (ref 8.4–10.5)
Creatinine, Ser: 0.69 mg/dL (ref 0.50–1.35)

## 2011-03-15 LAB — MAGNESIUM: Magnesium: 2.1 mg/dL (ref 1.5–2.5)

## 2011-03-15 LAB — PHOSPHORUS: Phosphorus: 3.5 mg/dL (ref 2.3–4.6)

## 2011-03-15 MED ORDER — LEVETIRACETAM 500 MG PO TABS
1000.0000 mg | ORAL_TABLET | Freq: Two times a day (BID) | ORAL | Status: DC
  Administered 2011-03-15 – 2011-03-16 (×4): 1000 mg via ORAL
  Filled 2011-03-15 (×6): qty 2

## 2011-03-15 MED ORDER — HALOPERIDOL LACTATE 5 MG/ML IJ SOLN
INTRAMUSCULAR | Status: AC
  Administered 2011-03-15: 5 mg via INTRAMUSCULAR
  Filled 2011-03-15: qty 1

## 2011-03-15 MED ORDER — ZIPRASIDONE MESYLATE 20 MG IM SOLR: 10.0000 mg | Freq: Once | INTRAMUSCULAR | Status: DC

## 2011-03-15 MED ORDER — PHENYTOIN SODIUM EXTENDED 100 MG PO CAPS: 100.0000 mg | ORAL_CAPSULE | Freq: Three times a day (TID) | ORAL | Status: DC

## 2011-03-15 MED ORDER — PHENYTOIN 50 MG PO CHEW
100.0000 mg | CHEWABLE_TABLET | Freq: Three times a day (TID) | ORAL | Status: DC
  Administered 2011-03-15 – 2011-03-16 (×5): 100 mg via ORAL
  Filled 2011-03-15 (×8): qty 2

## 2011-03-15 MED ORDER — PREDNISONE 20 MG PO TABS
40.0000 mg | ORAL_TABLET | Freq: Every day | ORAL | Status: DC
  Administered 2011-03-16 – 2011-03-17 (×2): 40 mg via ORAL
  Filled 2011-03-15 (×3): qty 2

## 2011-03-15 MED ORDER — HALOPERIDOL LACTATE 5 MG/ML IJ SOLN
5.0000 mg | Freq: Once | INTRAMUSCULAR | Status: AC
  Administered 2011-03-15: 5 mg via INTRAMUSCULAR

## 2011-03-15 NOTE — Progress Notes (Signed)
Dr Butler Denmark called to look at pt arms. Multiple sites red, swollen, warm to touch. Current IV not flushing well , will d/c. Unsure about placing another PIV until MD has seen d/t condition of pt arms. Dr will switch meds to po.

## 2011-03-15 NOTE — Progress Notes (Signed)
This CSW handing off to [facility] CSW, Catha Gosselin, who will follow to facilitate d/c when pt is medically ready.  Baxter Flattery, MSW [phone]

## 2011-03-15 NOTE — Progress Notes (Signed)
Subjective: 56 y/o male admitted for a COPD exacerbation which was improving but prior to being discharge began to seize.  He was very agitated today and would not take his medications from the nurse. IM Haldol needed to be given as his IV was not working. Now much more calm and cooperative but continues to ask me "can I go now".   Objective: Blood pressure 127/66, pulse 69, temperature 98 F (36.7 C), temperature source Oral, resp. rate 29, height 5\' 10"  (1.778 m), weight 69.5 kg (153 lb 3.5 oz), SpO2 96.00%. Weight change:   Intake/Output Summary (Last 24 hours) at 03/15/11 1529 Last data filed at 03/15/11 1300  Gross per 24 hour  Intake   1914 ml  Output   3120 ml  Net  -1206 ml    Physical Exam: General appearance: alert, cooperative and no distress Throat: lips, mucosa, and tongue normal; teeth and gums normal Lungs: clear to auscultation bilaterally Heart: regular rate and rhythm, S1, S2 normal, no murmur, click, rub or gallop Abdomen: soft, non-tender; bowel sounds normal; no masses,  no organomegaly Extremities: extremities normal, atraumatic, no cyanosis or edema Skin: Skin color, texture, turgor normal. No rashes or lesions Psych: quite loud and restless but very easily reoriented. Pleasantly answers questions with "yes and no maam". Following commands.   Lab Results:  Basename 03/15/11 0400 03/14/11 0455  NA 139 141  K 3.9 3.7  CL 105 105  CO2 28 26  GLUCOSE 117* 138*  BUN 16 12  CREATININE 0.69 0.64  CALCIUM 9.8 10.1  MG 2.1 2.0  PHOS 3.5 4.1    Basename 03/14/11 1205 03/13/11 0410  AST -- 9  ALT -- 11  ALKPHOS -- 63  BILITOT -- 0.2*  PROT -- 6.3  ALBUMIN 3.2* 3.2*   No results found for this basename: LIPASE:2,AMYLASE:2 in the last 72 hours  Basename 03/15/11 0400 03/14/11 0455  WBC 12.5* 12.5*  NEUTROABS -- --  HGB 11.7* 11.4*  HCT 36.6* 36.2*  MCV 82.2 81.9  PLT 252 225   No results found for this basename:  CKTOTAL:3,CKMB:3,CKMBINDEX:3,TROPONINI:3 in the last 72 hours No components found with this basename: POCBNP:3 No results found for this basename: DDIMER:2 in the last 72 hours No results found for this basename: HGBA1C:2 in the last 72 hours No results found for this basename: CHOL:2,HDL:2,LDLCALC:2,TRIG:2,CHOLHDL:2,LDLDIRECT:2 in the last 72 hours No results found for this basename: TSH,T4TOTAL,FREET3,T3FREE,THYROIDAB in the last 72 hours No results found for this basename: VITAMINB12:2,FOLATE:2,FERRITIN:2,TIBC:2,IRON:2,RETICCTPCT:2 in the last 72 hours  Micro Results: Recent Results (from the past 240 hour(s))  CULTURE, BLOOD (ROUTINE X 2)     Status: Normal (Preliminary result)   Collection Time   03/12/11  8:35 PM      Component Value Range Status Comment   Specimen Description BLOOD RIGHT ARM   Final    Special Requests BOTTLES DRAWN AEROBIC AND ANAEROBIC 10CC   Final    Culture  Setup Time [id number]   Final    Culture     Final    Value:        BLOOD CULTURE RECEIVED NO GROWTH TO DATE CULTURE WILL BE HELD FOR 5 DAYS BEFORE ISSUING A FINAL NEGATIVE REPORT   Report Status PENDING   Incomplete   CULTURE, BLOOD (ROUTINE X 2)     Status: Normal (Preliminary result)   Collection Time   03/12/11  8:50 PM      Component Value Range Status Comment   Specimen Description BLOOD RIGHT  ARM   Final    Special Requests BOTTLES DRAWN AEROBIC AND ANAEROBIC 10CC   Final    Culture  Setup Time [id number]   Final    Culture     Final    Value:        BLOOD CULTURE RECEIVED NO GROWTH TO DATE CULTURE WILL BE HELD FOR 5 DAYS BEFORE ISSUING A FINAL NEGATIVE REPORT   Report Status PENDING   Incomplete   MRSA PCR SCREENING     Status: Abnormal   Collection Time   03/13/11  2:32 AM      Component Value Range Status Comment   MRSA by PCR POSITIVE (*) NEGATIVE  Final     Studies/Results: Dg Chest 1 View  02/17/2011  *RADIOLOGY REPORT*  Clinical Data: Shortness of breath.  CHEST - 1 VIEW   Comparison: 02/15/2011, 12/04/2010 and 10/06/2010 chestx-ray. 12/04/2010 CT.  Findings: Pulmonary vascular congestion.  No pneumothorax.  Lung parenchymal changes more notable on the left. This may represent sequelae of prior infection superimposed upon chronic changes.  This patient had adenopathy on the prior chest CT, and follow-up CT imaging at such time was recommended in 3 - 6 months. When this follow-up CT is obtained than the lung parenchymal changes can be readdressed to exclude malignancy.  When compared to the most recent chest x-ray examination there has been no significant change.  IMPRESSION: No significant change since recent chest x-ray.  Follow up as discussed above.  Original Report Authenticated By: Fuller Canada, M.D.   Dg Chest 2 View  03/10/2011  *RADIOLOGY REPORT*  Clinical Data: Shortness of breath.  CHEST - 2 VIEW  Comparison: 03/04/2011 and CT from 02/18/2011  Findings: Two views of the chest were obtained.  Again noted is hyperinflation and chronic lung changes.  There are slightly increased densities in the left lung base and cannot exclude an acute process in this area.  Evidence for chronic changes in the right upper lung.  Heart size is stable and within normal limits. The trachea is midline.  IMPRESSION: Chronic lung changes.  The patient has chronic changes at the left lung base but it is more conspicuous on today's examination. Difficult to exclude an acute process in this area.  Original Report Authenticated By: Richarda Overlie, M.D.   Dg Chest 2 View  03/04/2011  *RADIOLOGY REPORT*  Clinical Data: Shortness of breath  CHEST - 2 VIEW  Comparison: 02/25/2011  Findings: Coarse interstitial markings in the suprahilar regions and lung bases are stable.  No new infiltrate.  No effusion.  Heart size remains normal.  Regional bones unremarkable.  IMPRESSION:  Chronic parenchymal changes.  No acute disease.  Original Report Authenticated By: Osa Craver, M.D.   Dg Chest 2  View  02/15/2011  *RADIOLOGY REPORT*  Clinical Data: Chest pain and shortness of breath.  CHEST - 2 VIEW  Comparison: 01/31/2011  Findings: Shallow inspiration.  Normal heart size and pulmonary vascularity.  Patchy interstitial changes in the lungs similar to previous study.  No developing airspace consolidation.  No blunting of costophrenic angles.  No pneumothorax.  Diffuse emphysematous changes.  Mild prominence of central pulmonary vascularity may represent pulmonary arterial hypertension.  Old right rib fracture. Degenerative changes in the thoracic spine and shoulders.  IMPRESSION: Stable patchy nodular areas of fibrosis in the lungs. Emphysematous changes.  Prominent central pulmonary vascularity suggesting pulmonary hypertension.  No active consolidation.  Original Report Authenticated By: Marlon Pel, M.D.   Ct Head  Wo Contrast  03/13/2011  *RADIOLOGY REPORT*  Clinical Data: History of seizures today.  Mental status change.  CT HEAD WITHOUT CONTRAST  Technique:  Contiguous axial images were obtained from the base of the skull through the vertex without contrast.  Comparison: Head CT dated 03/12/2011.  Findings: Study is slightly limited by patient motion.  However, no definite acute intracranial abnormalities are noted.  Specifically, no definite evidence of acute/subacute ischemia, focal mass, mass effect, hydrocephalus or abnormal intra or extra-axial fluid collections.  No displaced skull fractures are noted.  Visualized paranasal sinuses and mastoids are remarkable for areas of mucosal thickening throughout the ethmoid and maxillary sinuses bilaterally, without air fluid levels.  IMPRESSION: 1.  No acute intracranial abnormalities. 2.  Changes suggestive of chronic sinusitis redemonstrated, as above.  Original Report Authenticated By: Florencia Reasons, M.D.   Ct Head Wo Contrast  03/12/2011  *RADIOLOGY REPORT*  Clinical Data: Code stroke,  CT HEAD WITHOUT CONTRAST  Technique:  Contiguous  axial images were obtained from the base of the skull through the vertex without contrast.  Comparison: 02/17/2011  Findings: Images are degraded by unconventional positioning. Within this limitation, There is no evidence for acute hemorrhage, hydrocephalus, mass lesion, or abnormal extra-axial fluid collection.  No definite CT evidence for acute infarction.  Right maxillary sinus and to a lesser extent left maxillary sinus mucosal thickening.  Partially opacified ethmoid air cells. visualized paranasal sinuses and mastoid air cells are predominately clear.  IMPRESSION: No acute intracranial abnormality. Discussed via telephone with the patient's nurse, Whitney, at 10:10 p.m. on 03/12/2011.  Partially opacified ethmoid air cells.  Correlate clinically if concerned for sinusitis.  Original Report Authenticated By: Waneta Martins, M.D.   Ct Head Wo Contrast  02/17/2011  *RADIOLOGY REPORT*  Clinical Data: Fall and hit head.  Left-sided numbness.  CT HEAD WITHOUT CONTRAST  Technique:  Contiguous axial images were obtained from the base of the skull through the vertex without contrast.  Comparison: 12/03/2003  Findings: There is motion artifact at the cerebellum despite obtaining additional images.  No evidence for acute hemorrhage, mass lesion, midline shift, hydrocephalus or large infarct.  There is diffuse mucosal disease in the maxillary sinuses and ethmoid air cells.  Visualized mastoid air cells are clear.  IMPRESSION: No acute intracranial abnormality.  Limited evaluation at the skull base and cerebellum due to motion artifact.  Paranasal sinus disease.  Original Report Authenticated By: Richarda Overlie, M.D.   Ct Angio Chest W/cm &/or Wo Cm  02/18/2011  *RADIOLOGY REPORT*  Clinical Data: Chest pain for few days.  History of seizures. Schizophrenia.  Mental retardation.  COPD.  Hypertension. .  CT ANGIOGRAPHY CHEST  Technique:  Multidetector CT imaging of the chest using the standard protocol during bolus  administration of intravenous contrast. Multiplanar reconstructed images including MIPs were obtained and reviewed to evaluate the vascular anatomy.  Contrast: 80mL OMNIPAQUE IOHEXOL 350 MG/ML IV SOLN  Comparison: Plain film of 02/17/2011.  CT of 12/04/2010.  Findings: Lung windows demonstrate secretions within the dependent trachea.  Nonspecific bronchial wall thickening, primarily to the lower lobes.  Moderate centrilobular emphysema with biapical pleural parenchymal scarring.  A subpleural 4 mm right middle lobe nodule on image 98 was not present on the prior.  Calcified granuloma more inferiorly.  Scarring in the lung bases bilaterally.  Ill-defined defined nodular opacity in the superior segment right lower lobe measures 8 mm on image 69.  This was is not present on the prior exam.  Mildly spiculated and contacts the right major fissure.  Similar probable scarring in the posterior left upper lobe, image 35 - 50 of series 6.  An area of subpleural left upper lobe interstitial opacity, including on image 61 is slightly improved and favored to represent an area of decreased infection.  Soft tissue windows:  The quality of this exam for evaluation of pulmonary embolism is good. No evidence of pulmonary embolism.  Bilateral gynecomastia.  Small stable left axillary nodes.  Normal aortic caliber without dissection.  Mild cardiomegaly with multivessel coronary artery atherosclerosis.  Trace bilateral pleural effusions.  Similar increased number of small mediastinal nodes.  Right hilar adenopathy 1.6 cm, similar to on the prior (when remeasured).  Left infrahilar nodal tissue measures up to 1.2 cm and is similar. The esophagus is mildly thick-walled.  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 More well-defined soft tissue density within the inferior aspect prevascular space.  This is contiguous with the thymus, measures 1.8 x 3.3 cm on image 75.  Similar to on the prior exam.  Limited abdominal imaging  demonstrates no significant findings. Nonacute bilateral rib fractures.  Mild accentuation of expected thoracic kyphosis.  IMPRESSION:  1. No evidence of pulmonary embolism. 2.  Centrilobular emphysema with areas of scarring and probable resolving infection (i.e. left upper lobe). 3.  New spiculated 8 mm right lower lobe superior segment lung nodule.  Given development since 12/04/2010, favored to be infectious or inflammatory.  Consider antibiotic therapy and follow- up with chest CT of 3 months. 4.  Cardiomegaly with advanced coronary artery atherosclerosis and small bilateral pleural effusions. 5.  Esophageal wall thickening is mild and suggests esophagitis. 6.  Similar thoracic adenopathy.  Likely reactive. 7.  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  Inferiorly, this has a somewhat more mass-like appearance, which is similar to on the prior.  This can be reevaluated on follow-up chest CT.  Original Report Authenticated By: Consuello Bossier, M.D.   Dg Chest Port 1 View  02/25/2011  *RADIOLOGY REPORT*  Clinical Data: Shortness of breath, wheezing.  PORTABLE CHEST - 1 VIEW  Comparison: 02/17/2011  Findings: Hyperinflation of the lungs.  Heart is normal size. Chronic changes in the lungs.  No effusions.  No acute bony abnormality.  Evidence of old injury in the proximal left humerus.  IMPRESSION: Hyperinflation/chronic changes.  Original Report Authenticated By: Cyndie Chime, M.D.    Medications: Scheduled Meds:   . albuterol  2.5 mg Nebulization Q6H  . amLODipine  10 mg Oral Daily  . Chlorhexidine Gluconate Cloth  6 each Topical Q0600  . docusate sodium  100 mg Oral BID  . haloperidol lactate  5 mg Intramuscular Once  . ipratropium  0.5 mg Nebulization Q6H  . levETIRAcetam  1,000 mg Oral BID  . levothyroxine  75 mcg Oral Q breakfast  . lithium carbonate  300 mg Oral TID WC  . methylPREDNISolone (SOLU-MEDROL) injection  40 mg Intravenous Q12H  . mupirocin cream   Topical BID  . pantoprazole   40 mg Oral Daily  . phenytoin  100 mg Oral TID  . risperiDONE  1 mg Oral Daily  . risperiDONE  2 mg Oral QHS  . sodium chloride  3 mL Intravenous Q12H  . sodium chloride  3 mL Intravenous Q12H  . Tamsulosin HCl  0.4 mg Oral PC supper  . DISCONTD: levetiracetam  1,000 mg Intravenous Q12H  . DISCONTD: phenytoin  100 mg Oral TID  . DISCONTD: phenytoin (DILANTIN) IV  100 mg Intravenous Q8H  . DISCONTD: ziprasidone  10 mg Intramuscular Once   Continuous Infusions:  PRN Meds:.sodium chloride, albuterol, haloperidol, sodium chloride  Assessment/Plan: Principal Problem:  *Seizure disorder Appreciate Neuro input. To continue Dilantin and Keppra for maintenance. Have switched to PO now.   Active Problems:  COPD with acute exacerbation Resolving. D/c IV solumedrol and start Prednisone in AM.    Schizoaffective disorder Cont antipsychotics. May need IV/ IM dosing if agitation worsens. Cont sitter in room.    Mental retardation, mild (I.Q. 50-70)  Hypothyroidism  HTN (hypertension) controlled  Tobacco use   LOS: 5 days   [facility] [phone] 03/15/2011, 3:29 PM

## 2011-03-15 NOTE — Progress Notes (Signed)
Clinical social worker received transfer of patient to hospitalist service team 1. CSW will continue to follow to assist with pt dc plans to return to [facility]  group home when pt is medically stable.   Catha Gosselin, Theresia Majors  [phone] .03/15/2011 11:47am

## 2011-03-16 MED ORDER — TAMSULOSIN HCL 0.4 MG PO CAPS
0.4000 mg | ORAL_CAPSULE | Freq: Every day | ORAL | Status: DC
  Filled 2011-03-16 (×2): qty 1

## 2011-03-16 MED ORDER — PREDNISONE 20 MG PO TABS: 40.0000 mg | ORAL_TABLET | Freq: Every day | ORAL | Status: DC

## 2011-03-16 MED ORDER — HALOPERIDOL 1 MG PO TABS
1.0000 mg | ORAL_TABLET | Freq: Once | ORAL | Status: AC
  Administered 2011-03-16: 1 mg via ORAL
  Filled 2011-03-16: qty 1

## 2011-03-16 MED ORDER — PHENYTOIN 50 MG PO CHEW: 100.0000 mg | CHEWABLE_TABLET | Freq: Three times a day (TID) | ORAL | Status: DC

## 2011-03-16 MED ORDER — MOXIFLOXACIN HCL 400 MG PO TABS: 400.0000 mg | ORAL_TABLET | Freq: Every day | ORAL | Status: DC

## 2011-03-16 MED ORDER — LEVETIRACETAM 1000 MG PO TABS: 1000.0000 mg | ORAL_TABLET | Freq: Two times a day (BID) | ORAL | Status: DC

## 2011-03-16 MED ORDER — HALOPERIDOL LACTATE 5 MG/ML IJ SOLN
2.0000 mg | Freq: Once | INTRAMUSCULAR | Status: AC
  Administered 2011-03-16: 2 mg via INTRAMUSCULAR
  Filled 2011-03-16: qty 1

## 2011-03-16 MED ORDER — HALOPERIDOL 2 MG PO TABS
2.0000 mg | ORAL_TABLET | Freq: Once | ORAL | Status: AC
  Filled 2011-03-16: qty 1

## 2011-03-16 NOTE — Progress Notes (Signed)
Called group home manager Ryan & informed that pt's discharge order is cancelled. Dr. Lyman Speller wants to keep the pt. Overnight for monitoring.

## 2011-03-16 NOTE — Discharge Summary (Addendum)
DISCHARGE SUMMARY  [name]  MR#: [id number]  DOB:[birth year]  Date of Admission: 03/10/2011 Date of Discharge: 03/16/2011  Attending Physician:Dema Timmons  Patient's YNW:GNFAOZH,YQMVHQIO I, MD, MD  Consults:  Pulmonary and critical care medicine-  Neurology- Carmell Austria   Presenting Complaint: Shortness of breath  Discharge Diagnoses: COPD with acute exacerbation Seizure  Schizoaffective disorder  Mental retardation, mild (I.Q. 50-70)  Hypothyroidism  HTN (hypertension)  Tobacco use    Discharge Medications: Medication List  As of 03/16/2011  3:34 PM   TAKE these medications         albuterol-ipratropium 18-103 MCG/ACT inhaler   Commonly known as: COMBIVENT   Inhale 2 puffs into the lungs every 6 (six) hours as needed. Shortness of breath.      amLODipine 10 MG tablet   Commonly known as: NORVASC   Take 10 mg by mouth daily.      diphenhydrAMINE 50 MG capsule   Commonly known as: BENADRYL   Take 50 mg by mouth at bedtime as needed. For insomnia      docusate sodium 100 MG capsule   Commonly known as: COLACE   Take 100 mg by mouth 2 (two) times daily.      fluticasone 44 MCG/ACT inhaler   Commonly known as: FLOVENT HFA   Inhale 2 puffs into the lungs 2 (two) times daily.      haloperidol 2 MG tablet   Commonly known as: HALDOL   Take 2 mg by mouth every 6 (six) hours as needed. agitation      levETIRAcetam 1000 MG tablet   Commonly known as: KEPPRA   Take 1 tablet (1,000 mg total) by mouth 2 (two) times daily.      levothyroxine 75 MCG tablet   Commonly known as: SYNTHROID, LEVOTHROID   Take 75 mcg by mouth daily.      lisinopril 5 MG tablet   Commonly known as: PRINIVIL,ZESTRIL   Take 5 mg by mouth daily.      lithium carbonate 300 MG capsule   Take 300 mg by mouth 3 (three) times daily with meals.      omeprazole 20 MG capsule   Commonly known as: PRILOSEC   Take 20 mg by mouth 2 (two) times daily.      phenytoin 50 MG tablet   Commonly  known as: DILANTIN   Chew 2 tablets (100 mg total) by mouth 3 (three) times daily.      potassium chloride 20 MEQ packet   Commonly known as: KLOR-CON   Take 20 mEq by mouth daily.      predniSONE 50 MG tablet   Commonly known as: DELTASONE   Take 50 mg tablet today and taper down by 10 mg per day until completed      predniSONE 20 MG tablet   Commonly known as: DELTASONE   Take 2 tablets (40 mg total) by mouth daily with breakfast.      risperiDONE 2 MG tablet   Commonly known as: RISPERDAL   Take 2 mg by mouth at bedtime.      risperiDONE 1 MG tablet   Commonly known as: RISPERDAL   Take 1 mg by mouth every morning.      Tamsulosin HCl 0.4 MG Caps   Commonly known as: FLOMAX   Take 0.4 mg by mouth daily after supper.      traZODone 100 MG tablet   Commonly known as: DESYREL   Take 100 mg by mouth at bedtime.  Procedures:  CHEST - 2 VIEW  Comparison: 03/04/2011 and CT from 02/18/2011  Findings: Two views of the chest were obtained. Again noted is  hyperinflation and chronic lung changes. There are slightly  increased densities in the left lung base and cannot exclude an  acute process in this area. Evidence for chronic changes in the  right upper lung. Heart size is stable and within normal limits.  The trachea is midline.  IMPRESSION:  Chronic lung changes. The patient has chronic changes at the left  lung base but it is more conspicuous on today's examination.  Difficult to exclude an acute process in this area.  Ct Head Wo Contrast  03/13/2011  *RADIOLOGY REPORT*  Clinical Data: History of seizures today.  Mental status change.  CT HEAD WITHOUT CONTRAST  Technique:  Contiguous axial images were obtained from the base of the skull through the vertex without contrast.  Comparison: Head CT dated 03/12/2011.  Findings: Study is slightly limited by patient motion.  However, no definite acute intracranial abnormalities are noted.  Specifically, no  definite evidence of acute/subacute ischemia, focal mass, mass effect, hydrocephalus or abnormal intra or extra-axial fluid collections.  No displaced skull fractures are noted.  Visualized paranasal sinuses and mastoids are remarkable for areas of mucosal thickening throughout the ethmoid and maxillary sinuses bilaterally, without air fluid levels.  IMPRESSION: 1.  No acute intracranial abnormalities. 2.  Changes suggestive of chronic sinusitis redemonstrated, as above.  Original Report Authenticated By: Florencia Reasons, M.D.   Ct Head Wo Contrast 03/12/2011  *RADIOLOGY REPORT*  Clinical Data: Code stroke,  CT HEAD WITHOUT CONTRAST  Technique:  Contiguous axial images were obtained from the base of the skull through the vertex without contrast.  Comparison: 02/17/2011  Findings: Images are degraded by unconventional positioning. Within this limitation, There is no evidence for acute hemorrhage, hydrocephalus, mass lesion, or abnormal extra-axial fluid collection.  No definite CT evidence for acute infarction.  Right maxillary sinus and to a lesser extent left maxillary sinus mucosal thickening.  Partially opacified ethmoid air cells. visualized paranasal sinuses and mastoid air cells are predominately clear.  IMPRESSION: No acute intracranial abnormality. Discussed via telephone with the patient's nurse, Whitney, at 10:10 p.m. on 03/12/2011.  Partially opacified ethmoid air cells.  Correlate clinically if concerned for sinusitis.  Original Report Authenticated By: Waneta Martins, M.D.   EEG CLINICAL INTERPRETATION: This routine EEG done with the patient awake  and drowsy is abnormal. The presence of left frontal central slowing  combined with sharp waves in the same region suggest an underlying  possible source of seizures. No electrographic seizures, nor  nonconvulsive status epilepticus were seen. Denton Meek, MD  Hospital Course:   COPD with acute exacerbation and seizure disorder Pt is 56  yo male who presented to [facility] on 3/9 with progressively worsening shortness of breath that started 2-3 days prior to admission and has been associated with subjective fevers and chills. He also had a cough which was productive. He had been discharged on 3/4 after being treated for the same.  He was admitted to be treated for a COPD exacerbation and started on IV steroids and antibiotics. He improved quickley and was going to be discharged back to his group home on 3/11 when he developed a seizure in the evening. He was treated with IV Ativan 4mg  total and then loaded with IV Keppra (which he had already been taking). Dilantin was also added. He has not had any seizures since.  Neurology recommends long term Keppra and Dilantin at the above mentioned dosing. His dilantin level is about 20. EEG mentioned above is consistent with a possible underlying seizure disorder.   Schizoaffective disorder  Mental retardation, mild (I.Q. 50-70) The patient was often restless and loud and at times combative requiring parenteral Haldol adminstration.    Hypothyroidism  HTN (hypertension) - remained controlled  Tobacco use- he was counseled in regards to stopping smoking.   Day of Discharge Physical Exam: BP 122/76  Pulse 78  Temp(Src) 98 F (36.7 C) (Oral)  Resp 19  Ht 5\' 10"  (1.778 m)  Wt 69.5 kg (153 lb 3.5 oz)  BMI 21.98 kg/m2  SpO2 92% General appearance: alert, cooperative and no distress  Throat: lips, mucosa, and tongue normal; teeth and gums normal  Lungs: clear to auscultation bilaterally  Heart: regular rate and rhythm, S1, S2 normal, no murmur, click, rub or gallop  Abdomen: soft, non-tender; bowel sounds normal; no masses, no organomegaly  Extremities: legs normal, , no cyanosis or edema - arms covered with scratches which have dressing on them. None appear infected.  Skin: Skin color, texture, turgor normal. No rashes or lesions  Psych: quite loud and restless but very easily reoriented.  Pleasantly answers questions with "yes and no maam". Following commands.    Disposition: stable   Follow-up Appts: Discharge Orders    Future Appointments: Provider: Department: Dept Phone: Center:   03/28/2011 11:15 AM Barbaraann Share, MD [facility] [phone] None     Future Orders Please Complete By Expires   Diet - low sodium heart healthy      Diet - low sodium heart healthy      Increase activity slowly      Discharge instructions      Comments:   You have Avelox antibiotic for pneumonia to complete for 7 days. In addition, you have Prednisone taper pack to complete over the next 5 days.   Increase activity slowly         Follow-up with:  Dr.MOREIRA,FERNANDA I, MD in 1-2 weeks. Marcelyn Bruins, MD- as above   Time on Discharge: >25min  Signed: Calvert Cantor 03/16/2011, 3:34 PM   Addendum: The patient was not discharged yesterday as planned because he began to have episodes of "staring off". It was suspected these may be seizures but upon further Neuro eval by Dr Lyman Speller, it was surmised that this was psychosomatic. A psych consult has been recommended but the patient is restless and often combative and therefore I feel the best place for him now is back at his group home. He is not having any further episodes of staring.   Calvert Cantor, MD [phone]

## 2011-03-16 NOTE — Consult Note (Signed)
Called to assess patient in response to a suspected seizure. Patient reportedly was unresponsive after learning that he will be discharged. Previously, he had been very agitated the entire day. On my evaluation, the patient initially attended to me (looked at me), but he then rolled his head backwards with eyes partially closed and mouth partially open and became unresponsive even to sternal rub. No automatic activity was noted. No gaze deviation was noted.  PERRL, blink to threat positive bilaterally, face symmetric, no movement of any extremity, but moved both arms away from his face with arm suspended over his face.   Impression: Suspect psychosomatic etiology of these events likely related to patient not wanting to be discharged. Cont. same anti-seizure medications. Would keep overnight for monitoring and obtain a psychiatry consult. Call with questions.  Carmell Austria, MD

## 2011-03-16 NOTE — Progress Notes (Signed)
Clinical social worker assisted with pt dc to return to [facility]. Family care home manager to provide transportation for pt home and will be at pt room by 11pm to provide transportation. CSW made nurse aware and placed discharge packet with include fl2 and dc summary for home manager. .No further Clinical Social Work needs, signing off.   Catha Gosselin, Theresia Majors  [phone] .03/16/2011 1735pm

## 2011-03-16 NOTE — Progress Notes (Signed)
Clinical social worker spoke with Group Home Manager Ryan at [phone] regarding pt potential discharge for tonight. Group Home Manger confirmed pt could return if medically stable and can provide transportation for pt tonight at 11pm due to prior commitments with employment.   Pt anticipated to discharge today, csw awaiting to hear from MD.   .Catha Gosselin, LCSWA  [phone] .03/16/2011 11:47am

## 2011-03-16 NOTE — Progress Notes (Signed)
Utilization review completed.  

## 2011-03-16 NOTE — Progress Notes (Signed)
Approached patient with scheduled meds.  At time patient awake, alert and yelling out.  Pt. Asked "When am I going home."  I responded "later tonight @ 11 p.m..  Pt. then went into a deep/catatonic stare without any movements.  Pts. VSS.  Dr. Butler Denmark notified of event no new orders were given. Will continue to monitor.

## 2011-03-16 NOTE — Progress Notes (Signed)
Pt. Is very agitated and refusing to have his tele monitor/ leads back. Dr. Claiborne Billings informed.

## 2011-03-17 MED ORDER — HALOPERIDOL LACTATE 5 MG/ML IJ SOLN
10.0000 mg | Freq: Once | INTRAMUSCULAR | Status: AC
  Administered 2011-03-17: 10 mg via INTRAVENOUS

## 2011-03-17 MED ORDER — LORAZEPAM 2 MG/ML IJ SOLN
2.0000 mg | INTRAMUSCULAR | Status: DC | PRN
  Administered 2011-03-17 (×2): 2 mg via INTRAMUSCULAR
  Filled 2011-03-17 (×2): qty 1

## 2011-03-17 MED ORDER — HALOPERIDOL LACTATE 5 MG/ML IJ SOLN
5.0000 mg | Freq: Once | INTRAMUSCULAR | Status: AC
  Administered 2011-03-17: 5 mg via INTRAMUSCULAR

## 2011-03-17 MED ORDER — ZIPRASIDONE MESYLATE 20 MG IM SOLR: 20.0000 mg | Freq: Once | INTRAMUSCULAR | Status: DC

## 2011-03-17 MED ORDER — HALOPERIDOL LACTATE 5 MG/ML IJ SOLN
INTRAMUSCULAR | Status: AC
  Filled 2011-03-17: qty 1

## 2011-03-17 MED ORDER — HALOPERIDOL LACTATE 5 MG/ML IJ SOLN
10.0000 mg | Freq: Once | INTRAMUSCULAR | Status: AC
  Filled 2011-03-17: qty 2

## 2011-03-17 MED ORDER — LORAZEPAM 2 MG/ML IJ SOLN
INTRAMUSCULAR | Status: AC
  Filled 2011-03-17: qty 1

## 2011-03-17 MED ORDER — HALOPERIDOL LACTATE 5 MG/ML IJ SOLN
INTRAMUSCULAR | Status: AC
  Administered 2011-03-17: 10 mg
  Filled 2011-03-17: qty 1

## 2011-03-17 NOTE — Progress Notes (Signed)
Pt yelling from within room. Pt upset with the time (10pm) he will be picked up this evening. Group home manager called to state he will pick pt up at 5pm this day. Pt was told of this change in pick up time and verbally agreed to "behave" and not yell any longer. Pt requested verification of the pick up time and this RN assured him that the home manager had called to say pick up would be 5 pm today. MD advised of revised pick up time.Security and [employer] officer present in hallway. Will continue to monitor and advise attending as needed.

## 2011-03-17 NOTE — Progress Notes (Addendum)
Pt removed clock from wall and threw it to the wall breaking it. Pt turned over the recliner chair. Pt shoved over bed table. All actions were done in an angry manner. Pt yelling noise only no words. Pt note cooperative or following commands. Medication given IM per MAR. Security called to bedside. Pt remains aggitated and not following commands.Will continue to monitor and advise attending as needed.

## 2011-03-17 NOTE — Progress Notes (Signed)
Pt escorted to short stay to meet ride. Sitter given envelope with discharge instructions to givee to pt caregiver/ride.

## 2011-03-17 NOTE — Progress Notes (Signed)
eLink Physician-Brief Progress Note Patient Name: YUVAL RUBENS DOB: October 17, 1955 MRN: [id number]  Date of Service  03/17/2011   HPI/Events of Note   1. Thrashing, agitated  eICU Interventions  2. Haldol IM, reassess afterwards        Jadrian Bulman 03/17/2011, 12:16 AM

## 2011-03-17 NOTE — Progress Notes (Signed)
Note copied from MD sticky note. Pt. Noted to be very violent & combative- trashing things, bedside table,trying to hurt staff member, throwing computer, non-compliant with vital signs & tele monitoring, spitting at staff, uses vulgar language- all of these behaviors pt had done multiple times during the night. Had called security several times. Had haldol & Ativan IM given in different times. Pls see MAR. MD pt. Has unstable mind/ behavior, very manipulative & could potentially hurt somebody. Suggest pt need psychiatric consult.

## 2011-03-17 NOTE — Progress Notes (Signed)
Report from Night RN. Chart reviewed together. Handoff complete.Additional note referencing pt's behavior reviewed.Will continue to monitor and advise attending as needed.

## 2011-03-17 NOTE — Progress Notes (Addendum)
Spoke with [name] of group home that pt stays at via tele phone [phone]. Mr [name] will pick up pt at 2200 this evening. Pt made aware of this. Pt asked if he could wait in waiting room. Pt told that he had to wait in 2917 until Mr. [name] picks him up. Pt lowered head and did not show acknowledgement. Will continue to monitor and advise attending as needed. .MD was made aware of pick up time via text page.

## 2011-03-18 ENCOUNTER — Emergency Department (HOSPITAL_COMMUNITY): Payer: Medicare Other

## 2011-03-18 ENCOUNTER — Encounter (HOSPITAL_COMMUNITY): Payer: Self-pay

## 2011-03-18 ENCOUNTER — Emergency Department (HOSPITAL_COMMUNITY)
Admission: EM | Admit: 2011-03-18 | Discharge: 2011-03-19 | Disposition: A | Discharge status: Home / Self Care | Payer: Medicare Other | Attending: Emergency Medicine | Admitting: Emergency Medicine

## 2011-03-18 ENCOUNTER — Other Ambulatory Visit: Payer: Self-pay

## 2011-03-18 DIAGNOSIS — R079 Chest pain, unspecified: Secondary | ICD-10-CM | POA: Insufficient documentation

## 2011-03-18 DIAGNOSIS — J449 Chronic obstructive pulmonary disease, unspecified: Secondary | ICD-10-CM

## 2011-03-18 DIAGNOSIS — R062 Wheezing: Secondary | ICD-10-CM | POA: Insufficient documentation

## 2011-03-18 DIAGNOSIS — Z79899 Other long term (current) drug therapy: Secondary | ICD-10-CM | POA: Insufficient documentation

## 2011-03-18 DIAGNOSIS — R0789 Other chest pain: Secondary | ICD-10-CM

## 2011-03-18 DIAGNOSIS — R000 Tachycardia, unspecified: Secondary | ICD-10-CM | POA: Insufficient documentation

## 2011-03-18 DIAGNOSIS — F259 Schizoaffective disorder, unspecified: Secondary | ICD-10-CM | POA: Insufficient documentation

## 2011-03-18 DIAGNOSIS — J438 Other emphysema: Secondary | ICD-10-CM | POA: Insufficient documentation

## 2011-03-18 DIAGNOSIS — R0602 Shortness of breath: Secondary | ICD-10-CM | POA: Insufficient documentation

## 2011-03-18 DIAGNOSIS — I10 Essential (primary) hypertension: Secondary | ICD-10-CM | POA: Insufficient documentation

## 2011-03-18 LAB — CBC
HCT: 32 % — ABNORMAL LOW (ref 39.0–52.0)
Hemoglobin: 10.4 g/dL — ABNORMAL LOW (ref 13.0–17.0)
MCH: 26.3 pg (ref 26.0–34.0)
MCHC: 32.5 g/dL (ref 30.0–36.0)
MCV: 81 fL (ref 78.0–100.0)
Platelets: 228 10*3/uL (ref 150–400)
RBC: 3.95 MIL/uL — ABNORMAL LOW (ref 4.22–5.81)
RDW: 15.2 % (ref 11.5–15.5)
WBC: 10.2 K/uL (ref 4.0–10.5)

## 2011-03-18 LAB — DIFFERENTIAL
Basophils Absolute: 0 10*3/uL (ref 0.0–0.1)
Basophils Relative: 0 % (ref 0–1)
Eosinophils Absolute: 0.4 10*3/uL (ref 0.0–0.7)
Eosinophils Relative: 4 % (ref 0–5)
Lymphocytes Relative: 20 % (ref 12–46)
Lymphs Abs: 2.1 K/uL (ref 0.7–4.0)
Monocytes Absolute: 1.1 K/uL — ABNORMAL HIGH (ref 0.1–1.0)
Monocytes Relative: 11 % (ref 3–12)
Neutro Abs: 6.6 K/uL (ref 1.7–7.7)
Neutrophils Relative %: 65 % (ref 43–77)

## 2011-03-18 LAB — BASIC METABOLIC PANEL
Calcium: 8.8 mg/dL (ref 8.4–10.5)
GFR calc non Af Amer: >90 mL/min (ref >90)
Glucose, Bld: 111 mg/dL — ABNORMAL HIGH (ref 70–99)
Sodium: 137 mEq/L (ref 135–145)

## 2011-03-18 LAB — BASIC METABOLIC PANEL WITH GFR
BUN: 14 mg/dL (ref 6–23)
CO2: 30 meq/L (ref 19–32)
Chloride: 102 meq/L (ref 96–112)
Creatinine, Ser: 0.82 mg/dL (ref 0.50–1.35)
GFR calc Af Amer: >90 mL/min (ref >90)
Potassium: 3.8 meq/L (ref 3.5–5.1)

## 2011-03-18 LAB — TROPONIN I: Troponin I: <0.3 ng/mL (ref <0.30)

## 2011-03-18 LAB — POCT I-STAT TROPONIN I: Troponin i, poc: 0.01 ng/mL (ref 0.00–0.08)

## 2011-03-18 MED ORDER — KETOROLAC TROMETHAMINE 30 MG/ML IJ SOLN
30.0000 mg | Freq: Once | INTRAMUSCULAR | Status: AC
  Administered 2011-03-18: 30 mg via INTRAMUSCULAR
  Filled 2011-03-18: qty 1

## 2011-03-18 MED ORDER — IBUPROFEN 800 MG PO TABS
800.0000 mg | ORAL_TABLET | Freq: Once | ORAL | Status: AC
  Administered 2011-03-18: 800 mg via ORAL
  Filled 2011-03-18: qty 1

## 2011-03-18 MED ORDER — IOHEXOL 300 MG/ML  SOLN
100.0000 mL | Freq: Once | INTRAMUSCULAR | Status: AC | PRN
  Administered 2011-03-18: 100 mL via INTRAVENOUS

## 2011-03-18 MED ORDER — LORAZEPAM 1 MG PO TABS
1.0000 mg | ORAL_TABLET | Freq: Once | ORAL | Status: AC
  Administered 2011-03-18: 1 mg via ORAL
  Filled 2011-03-18: qty 1

## 2011-03-18 MED ORDER — HYDROMORPHONE HCL PF 1 MG/ML IJ SOLN
1.0000 mg | Freq: Once | INTRAMUSCULAR | Status: AC
  Administered 2011-03-18: 1 mg via INTRAVENOUS
  Filled 2011-03-18: qty 1

## 2011-03-18 MED ORDER — OXYCODONE-ACETAMINOPHEN 5-325 MG PO TABS
2.0000 | ORAL_TABLET | Freq: Once | ORAL | Status: AC
  Administered 2011-03-18: 2 via ORAL
  Filled 2011-03-18: qty 2

## 2011-03-18 MED ORDER — IPRATROPIUM BROMIDE 0.02 % IN SOLN
0.5000 mg | Freq: Once | RESPIRATORY_TRACT | Status: AC
  Administered 2011-03-18: 0.5 mg via RESPIRATORY_TRACT
  Filled 2011-03-18: qty 2.5

## 2011-03-18 MED ORDER — ALBUTEROL SULFATE (5 MG/ML) 0.5% IN NEBU
5.0000 mg | INHALATION_SOLUTION | Freq: Once | RESPIRATORY_TRACT | Status: AC
  Administered 2011-03-18: 5 mg via RESPIRATORY_TRACT
  Filled 2011-03-18: qty 1

## 2011-03-18 MED ORDER — ALUM & MAG HYDROXIDE-SIMETH 200-200-20 MG/5ML PO SUSP
15.0000 mL | Freq: Once | ORAL | Status: AC
  Administered 2011-03-18: 15 mL via ORAL
  Filled 2011-03-18: qty 30

## 2011-03-18 NOTE — ED Notes (Addendum)
EMS reports pt left sided reproducible chest pain, chronic pain, no other symptoms reported, symptoms all night with cough present

## 2011-03-18 NOTE — ED Provider Notes (Addendum)
History     CSN: [id number]  Arrival date & time 03/18/11  1120   First MD Initiated Contact with Patient 03/18/11 1339      Chief Complaint  Patient presents with  . Muscle Pain    (Consider location/radiation/quality/duration/timing/severity/associated sxs/prior treatment) HPI Comments: Pt reports pain that is in middle of left chest, non radiating associated with some SOB that began last night.  He reports with coughing, makes it worse.  He has h/o MR, COPD, MI.  He was recently admitted for similar symptoms.  He reprots he is quite hungry at the moment and would like something to eat.  He denies sweats, N/V.  Level 5 caveat due to MR.    Patient is a 56 y.o. male presenting with musculoskeletal pain. The history is provided by the patient and medical records.  Muscle Pain    Past Medical History  Diagnosis Date  . Seizures   . Psychiatric disorder   . Schizoaffective disorder   . Organic mood disorder   . Intermittent explosive disorder   . Generalized anxiety disorder   . Mild mental retardation   . COPD (chronic obstructive pulmonary disease)   . Hypertension   . Peptic ulcer   . Thyroid disease   . Prostate hyperplasia with urinary obstruction   . Blind left eye     History reviewed. No pertinent past surgical history.  Family History  Problem Relation Age of Onset  . Hypertension      History  Substance Use Topics  . Smoking status: Current Everyday Smoker -- 0.2 packs/day    Types: Cigarettes  . Smokeless tobacco: Never Used  . Alcohol Use: No      Review of Systems  Unable to perform ROS: Other    Allergies  Peanut-containing drug products; Coffee bean extract; Prolixin; Thorazine; and Lactose intolerance (gi)  Home Medications   Current Outpatient Rx  Name Route Sig Dispense Refill  . IPRATROPIUM-ALBUTEROL 18-103 MCG/ACT IN AERO Inhalation Inhale 2 puffs into the lungs every 6 (six) hours as needed. Shortness of breath.    . AMLODIPINE  BESYLATE 10 MG PO TABS Oral Take 10 mg by mouth daily.      Marland Kitchen DIPHENHYDRAMINE HCL 50 MG PO CAPS Oral Take 50 mg by mouth at bedtime as needed. For insomnia    . DOCUSATE SODIUM 100 MG PO CAPS Oral Take 100 mg by mouth 2 (two) times daily.    Marland Kitchen FLUTICASONE PROPIONATE  HFA 44 MCG/ACT IN AERO Inhalation Inhale 2 puffs into the lungs 2 (two) times daily.    Marland Kitchen LEVOTHYROXINE SODIUM 75 MCG PO TABS Oral Take 75 mcg by mouth daily.      Marland Kitchen LISINOPRIL 5 MG PO TABS Oral Take 5 mg by mouth daily.      Marland Kitchen LITHIUM CARBONATE 300 MG PO CAPS Oral Take 300 mg by mouth 3 (three) times daily with meals.    . OMEPRAZOLE 20 MG PO CPDR Oral Take 20 mg by mouth 2 (two) times daily.      Marland Kitchen POTASSIUM CHLORIDE 20 MEQ PO PACK Oral Take 20 mEq by mouth daily.      Marland Kitchen RISPERIDONE 2 MG PO TABS Oral Take 2 mg by mouth at bedtime.    . TRAZODONE HCL 100 MG PO TABS Oral Take 100 mg by mouth at bedtime.      Marland Kitchen HALOPERIDOL 2 MG PO TABS Oral Take 2 mg by mouth every 6 (six) hours as needed. agitation  BP 139/94  Pulse 112  Temp(Src) 98.1 F (36.7 C) (Oral)  Resp 18  SpO2 96%  Physical Exam  Nursing note and vitals reviewed. Constitutional: He appears well-developed and well-nourished.  HENT:  Head: Normocephalic and atraumatic.  Eyes: Pupils are equal, round, and reactive to light.  Neck: Neck supple.  Cardiovascular: Regular rhythm.   No extrasystoles are present. Tachycardia present.  Exam reveals no decreased pulses.   No murmur heard. Pulmonary/Chest: No respiratory distress. He has wheezes.  Abdominal: Soft. He exhibits no distension. There is no tenderness. There is no CVA tenderness.  Skin: Skin is warm and dry.       Various areas of excoriation and abrasions that he has done to self from itching.  No lacerations or significant wounds requiring anything but basic skin care.      ED Course  Procedures (including critical care time)   Labs Reviewed  POCT I-STAT TROPONIN I   Dg Chest 2 View  03/18/2011   *RADIOLOGY REPORT*  Clinical Data: Mid chest pain.  CHEST - 2 VIEW  Comparison: 03/10/2011  Findings: The patient has extensive chronic interstitial obstructive lung disease with vague areas of increased density at the left base laterally with a small nodule in the left apex. These are unchanged since multiple prior chest x-rays as well as prior CT scans of the chest.  There is a vague area of density in the right midzone laterally which has cleared since the prior study.  No effusions.  No acute osseous abnormality.  Mild thoracic scoliosis with accentuation of the thoracic kyphosis and osteopenia.  IMPRESSION: Fairly severe chronic interstitial and obstructive lung disease. No acute abnormalities.  Original Report Authenticated By: Gwynn Burly, M.D.   I reviewed the above films.    1. Atypical chest pain   2. COPD (chronic obstructive pulmonary disease)    RA sat is 96% and normal.   ECG at time 11:34 shows sinus tachy at rate 108, normal axis, LVH with repol abn, non specific ST abn in septal leads.  Comapred to ECG on 03/11/11, rate is faster and LVH criteria is now present.      5:24 PM Pt given toradol and 2 percocet.  Pt reprots pain si no better, reports pain is worse with breathing now.  In review of prior charts, he has had neg CT angio for PE both on 02/18/11 and 12/04/10.    With sats at 96% on RA, I don't think another CT is needed. However due to continue unrelenting CP, may require admission.  Discussed with Dr. Daleen Snook in sign out. MDM  I have reviewed recent records.  Pt was admitted on 3/3 for CP and he was rule out for MI and had neg CT of chest for PE. He was admitted again on 3/9 for COPD exacerbation.  He is on combivent since then and albuterol.  He is afebrile, he is continuing to smoke.  He reports after eating some lunch, the pain in left chest area is worse.  He si receiving neb currently.  ECG shows no new ischemia.  Neg troponin here rules out ACS given he has had  pain since last night.  toradol and 2 percocet given for what I believe to chest wall pain.          Gavin Pound. Oletta Lamas, MD 03/18/11 1705  Gavin Pound. Savas Elvin, MD 03/18/11 1724

## 2011-03-18 NOTE — ED Notes (Signed)
Pt. To xray after eating sandwich.

## 2011-03-18 NOTE — ED Notes (Signed)
Patient transported to CT 

## 2011-03-18 NOTE — ED Provider Notes (Signed)
Pt chest pain is resolved and wanting to be d/c'd. He has had 2 sets of negative trop. Neg CT chest for PE. Resolving infiltrates. Pt to follow up with PMD.   Loren Racer, MD 03/18/11 2249

## 2011-03-18 NOTE — ED Notes (Signed)
Patient here by ems with complaint of ongoing chestpain. EKG done on arrival and patient reports pain worse with movement

## 2011-03-18 NOTE — Discharge Instructions (Signed)
Chest Pain (Nonspecific) It is often hard to give a specific diagnosis for the cause of chest pain. There is always a chance that your pain could be related to something serious, such as a heart attack or a blood clot in the lungs. You need to follow up with your caregiver for further evaluation. CAUSES   Heartburn.   Pneumonia or bronchitis.   Anxiety or stress.   Inflammation around your heart (pericarditis) or lung (pleuritis or pleurisy).   A blood clot in the lung.   A collapsed lung (pneumothorax). It can develop suddenly on its own (spontaneous pneumothorax) or from injury (trauma) to the chest.   Shingles infection (herpes zoster virus).  The chest wall is composed of bones, muscles, and cartilage. Any of these can be the source of the pain.  The bones can be bruised by injury.   The muscles or cartilage can be strained by coughing or overwork.   The cartilage can be affected by inflammation and become sore (costochondritis).  DIAGNOSIS  Lab tests or other studies, such as X-rays, electrocardiography, stress testing, or cardiac imaging, may be needed to find the cause of your pain.  TREATMENT   Treatment depends on what may be causing your chest pain. Treatment may include:   Acid blockers for heartburn.   Anti-inflammatory medicine.   Pain medicine for inflammatory conditions.   Antibiotics if an infection is present.   You may be advised to change lifestyle habits. This includes stopping smoking and avoiding alcohol, caffeine, and chocolate.   You may be advised to keep your head raised (elevated) when sleeping. This reduces the chance of acid going backward from your stomach into your esophagus.   Most of the time, nonspecific chest pain will improve within 2 to 3 days with rest and mild pain medicine.  HOME CARE INSTRUCTIONS   If antibiotics were prescribed, take your antibiotics as directed. Finish them even if you start to feel better.   For the next few  days, avoid physical activities that bring on chest pain. Continue physical activities as directed.   Do not smoke.   Avoid drinking alcohol.   Only take over-the-counter or prescription medicine for pain, discomfort, or fever as directed by your caregiver.   Follow your caregiver's suggestions for further testing if your chest pain does not go away.   Keep any follow-up appointments you made. If you do not go to an appointment, you could develop lasting (chronic) problems with pain. If there is any problem keeping an appointment, you must call to reschedule.  SEEK MEDICAL CARE IF:   You think you are having problems from the medicine you are taking. Read your medicine instructions carefully.   Your chest pain does not go away, even after treatment.   You develop a rash with blisters on your chest.  SEEK IMMEDIATE MEDICAL CARE IF:   You have increased chest pain or pain that spreads to your arm, neck, jaw, back, or abdomen.   You develop shortness of breath, an increasing cough, or you are coughing up blood.   You have severe back or abdominal pain, feel nauseous, or vomit.   You develop severe weakness, fainting, or chills.   You have a fever.  THIS IS AN EMERGENCY. Do not wait to see if the pain will go away. Get medical help at once. Call your local emergency services (911 in [place] Do not drive yourself to the hospital. MAKE SURE YOU:   Understand these instructions.     Will watch your condition.   Will get help right away if you are not doing well or get worse.  Document Released: 09/27/2004 Document Revised: 12/07/2010 Document Reviewed: 07/24/2007 [facility] Patient Information 2012 [place], [place].    Take ibuprofen 400 mg three times daily with meals for the next 4 days to help reduce episodes of chest pain.  Follow up with Dr. Ludwig Clarks this week for a recheck.

## 2011-03-18 NOTE — ED Notes (Signed)
Pt states he was at the grocery store when the police came and called EMS about his chest pain.  Pt is acting unusual--bug eyes, rolling around in the bed, and yelling for a cup of coffee.  Pt states his pain began last night.

## 2011-03-18 NOTE — ED Notes (Signed)
Pt quieter now.  Has had appx 7 cups of coffee.

## 2011-03-19 LAB — CULTURE, BLOOD (ROUTINE X 2)
Culture  Setup Time: 201303120149
Culture: NO GROWTH

## 2011-03-28 ENCOUNTER — Institutional Professional Consult (permissible substitution): Payer: Self-pay | Admitting: Pulmonary Disease

## 2011-04-10 ENCOUNTER — Encounter (HOSPITAL_COMMUNITY): Payer: Self-pay | Admitting: *Deleted

## 2011-04-10 ENCOUNTER — Emergency Department (INDEPENDENT_AMBULATORY_CARE_PROVIDER_SITE_OTHER): Payer: Medicare Other

## 2011-04-10 ENCOUNTER — Encounter (HOSPITAL_COMMUNITY): Payer: Self-pay | Admitting: Emergency Medicine

## 2011-04-10 ENCOUNTER — Emergency Department (INDEPENDENT_AMBULATORY_CARE_PROVIDER_SITE_OTHER)
Admission: EM | Admit: 2011-04-10 | Discharge: 2011-04-10 | Disposition: A | Discharge status: Nursing Home (Includes ICF) | Payer: Medicare Other | Source: Home / Self Care

## 2011-04-10 ENCOUNTER — Inpatient Hospital Stay (HOSPITAL_COMMUNITY)
Admission: EM | Admit: 2011-04-10 | Discharge: 2011-04-19 | DRG: 481 | Disposition: A | Discharge status: Home Health | Payer: Medicare Other | Attending: Internal Medicine | Admitting: Internal Medicine

## 2011-04-10 DIAGNOSIS — F259 Schizoaffective disorder, unspecified: Secondary | ICD-10-CM | POA: Diagnosis present

## 2011-04-10 DIAGNOSIS — R071 Chest pain on breathing: Secondary | ICD-10-CM

## 2011-04-10 DIAGNOSIS — M810 Age-related osteoporosis without current pathological fracture: Secondary | ICD-10-CM | POA: Diagnosis present

## 2011-04-10 DIAGNOSIS — J449 Chronic obstructive pulmonary disease, unspecified: Secondary | ICD-10-CM

## 2011-04-10 DIAGNOSIS — G40909 Epilepsy, unspecified, not intractable, without status epilepticus: Secondary | ICD-10-CM | POA: Diagnosis present

## 2011-04-10 DIAGNOSIS — Z8249 Family history of ischemic heart disease and other diseases of the circulatory system: Secondary | ICD-10-CM

## 2011-04-10 DIAGNOSIS — J441 Chronic obstructive pulmonary disease with (acute) exacerbation: Secondary | ICD-10-CM

## 2011-04-10 DIAGNOSIS — S72033A Displaced midcervical fracture of unspecified femur, initial encounter for closed fracture: Principal | ICD-10-CM | POA: Diagnosis present

## 2011-04-10 DIAGNOSIS — S72009A Fracture of unspecified part of neck of unspecified femur, initial encounter for closed fracture: Secondary | ICD-10-CM

## 2011-04-10 DIAGNOSIS — N400 Enlarged prostate without lower urinary tract symptoms: Secondary | ICD-10-CM

## 2011-04-10 DIAGNOSIS — R569 Unspecified convulsions: Secondary | ICD-10-CM

## 2011-04-10 DIAGNOSIS — Y92009 Unspecified place in unspecified non-institutional (private) residence as the place of occurrence of the external cause: Secondary | ICD-10-CM

## 2011-04-10 DIAGNOSIS — F6381 Intermittent explosive disorder: Secondary | ICD-10-CM | POA: Diagnosis present

## 2011-04-10 DIAGNOSIS — K279 Peptic ulcer, site unspecified, unspecified as acute or chronic, without hemorrhage or perforation: Secondary | ICD-10-CM | POA: Diagnosis present

## 2011-04-10 DIAGNOSIS — E039 Hypothyroidism, unspecified: Secondary | ICD-10-CM | POA: Diagnosis present

## 2011-04-10 DIAGNOSIS — Z91010 Allergy to peanuts: Secondary | ICD-10-CM

## 2011-04-10 DIAGNOSIS — J4489 Other specified chronic obstructive pulmonary disease: Secondary | ICD-10-CM

## 2011-04-10 DIAGNOSIS — F70 Mild intellectual disabilities: Secondary | ICD-10-CM | POA: Diagnosis present

## 2011-04-10 DIAGNOSIS — F17200 Nicotine dependence, unspecified, uncomplicated: Secondary | ICD-10-CM | POA: Diagnosis present

## 2011-04-10 DIAGNOSIS — S72002A Fracture of unspecified part of neck of left femur, initial encounter for closed fracture: Secondary | ICD-10-CM

## 2011-04-10 DIAGNOSIS — Z79899 Other long term (current) drug therapy: Secondary | ICD-10-CM

## 2011-04-10 DIAGNOSIS — W010XXA Fall on same level from slipping, tripping and stumbling without subsequent striking against object, initial encounter: Secondary | ICD-10-CM | POA: Diagnosis present

## 2011-04-10 DIAGNOSIS — Z720 Tobacco use: Secondary | ICD-10-CM

## 2011-04-10 DIAGNOSIS — F39 Unspecified mood [affective] disorder: Secondary | ICD-10-CM | POA: Diagnosis present

## 2011-04-10 DIAGNOSIS — I10 Essential (primary) hypertension: Secondary | ICD-10-CM | POA: Diagnosis present

## 2011-04-10 HISTORY — DX: Malignant (primary) neoplasm, unspecified: C80.1

## 2011-04-10 HISTORY — DX: Chronic respiratory failure, unspecified whether with hypoxia or hypercapnia: J96.10

## 2011-04-10 MED ORDER — ALBUTEROL SULFATE (5 MG/ML) 0.5% IN NEBU
5.0000 mg | INHALATION_SOLUTION | Freq: Once | RESPIRATORY_TRACT | Status: AC
  Administered 2011-04-10: 5 mg via RESPIRATORY_TRACT

## 2011-04-10 MED ORDER — ALBUTEROL SULFATE (5 MG/ML) 0.5% IN NEBU
INHALATION_SOLUTION | RESPIRATORY_TRACT | Status: AC
  Filled 2011-04-10: qty 0.5

## 2011-04-10 NOTE — ED Notes (Signed)
PT. TRANSFERRED FROM [facility] , FELL 2 DAYS AGO FROM A GROUP HOME , STATES PAIN AT LEFT GROIN /LEFT HIP . X-RAY AND BLOOD TEST DONE AT URGENT CARE .

## 2011-04-10 NOTE — ED Notes (Signed)
Patient from group home; went to Urgent Care and transferred here.  Patient states that he tripped and fell two days ago; reports falling on his left side.  Denies passing out at time of incident or hitting head.  Patient complaining of left hip and left groin pain; unable to describe pain -- just points to area.  Patient states that pain worsens upon moving.  Per Urgent Care, hip fracture confirmed with x-rays.  Patient alert and oriented x4; PERRL present.  Upon arrival to room, patient given gown to change into.  Patient requesting something to eat; patient informed that we have to wait until the PA to see him before he can have anything to eat or drink.  Patient given urinal.  Will continue to monitor.

## 2011-04-10 NOTE — ED Notes (Signed)
Short of breath - has COPD - SOB worse since noon today.  Say had some nausea today and 2 loose stools. Also c/o fall a few days ago and has 'groin pain" Also C/o Chills onset a "couple nights ago". "caretaker" with him =- not forthcoming with history

## 2011-04-10 NOTE — ED Notes (Signed)
CALL PT'S CAREGIVER AS SOON AS PT IS ROOMED AT ANYTIME DURING NIGHT PT'S CAREGIVER HAS ALL INFORMATION FOR PT [name] [phone]

## 2011-04-10 NOTE — ED Provider Notes (Signed)
History     CSN: [id number]  Arrival date & time 04/10/11  1903   None     Chief Complaint  Patient presents with  . Shortness of Breath    (Consider location/radiation/quality/duration/timing/severity/associated sxs/prior treatment) HPI Comments: Chronic presents today with a caregiver from his care home. Patient reports onset of cough, dyspnea and wheezing at approximately noon today. The cough is productive with yellowish sputum. No nasal congestion, postnasal drainage, ear pain, sore throat, or fever. He has a history of COPD. His caregiver states that he has been out of one of his inhalers for approximately one month but he is uncertain which of his 2 inhalers he is out of. He also reportedly fell a few days ago getting into bed. Patient states that his legs gave out and he fell to the ground. He complains of pain in his left groin area since the fall. Pain is worse with weightbearing and walking and caregiver states he has noticed that he is walking differently - slower. He denies any dizziness, palpitations or chest discomfort prior to fall. He also denies head injury or loss of consciousness at the time of injury. Caregiver reports that he is also out of his Colace, omeprazole, Benadryl, potassium, and fluticasone inhaler.   Past Medical History  Diagnosis Date  . Seizures   . Psychiatric disorder   . Schizoaffective disorder   . Organic mood disorder   . Intermittent explosive disorder   . Generalized anxiety disorder   . Mild mental retardation   . COPD (chronic obstructive pulmonary disease)   . Hypertension   . Peptic ulcer   . Thyroid disease   . Prostate hyperplasia with urinary obstruction   . Blind left eye     History reviewed. No pertinent past surgical history.  Family History  Problem Relation Age of Onset  . Hypertension      History  Substance Use Topics  . Smoking status: Current Everyday Smoker -- 0.2 packs/day    Types: Cigarettes  . Smokeless  tobacco: Never Used  . Alcohol Use: No      Review of Systems  Constitutional: Negative for fever, chills and fatigue.  HENT: Positive for congestion. Negative for ear pain, sore throat, rhinorrhea, sneezing, postnasal drip and sinus pressure.   Respiratory: Positive for cough, shortness of breath and wheezing.   Cardiovascular: Negative for chest pain.    Allergies  Peanut-containing drug products; Coffee bean extract; Prolixin; Thorazine; and Lactose intolerance (gi)  Home Medications   Current Outpatient Rx  Name Route Sig Dispense Refill  . IPRATROPIUM-ALBUTEROL 18-103 MCG/ACT IN AERO Inhalation Inhale 2 puffs into the lungs every 6 (six) hours as needed. Shortness of breath.    . AMLODIPINE BESYLATE 10 MG PO TABS Oral Take 10 mg by mouth daily.      Marland Kitchen DIPHENHYDRAMINE HCL 50 MG PO CAPS Oral Take 50 mg by mouth at bedtime as needed. For insomnia    . DOCUSATE SODIUM 100 MG PO CAPS Oral Take 100 mg by mouth 2 (two) times daily.    Marland Kitchen FLUTICASONE PROPIONATE  HFA 44 MCG/ACT IN AERO Inhalation Inhale 2 puffs into the lungs 2 (two) times daily.    Marland Kitchen LEVOTHYROXINE SODIUM 75 MCG PO TABS Oral Take 75 mcg by mouth daily.      Marland Kitchen LISINOPRIL 5 MG PO TABS Oral Take 5 mg by mouth daily.      Marland Kitchen LITHIUM CARBONATE 300 MG PO CAPS Oral Take 300 mg by mouth 3 (  three) times daily with meals.    . OMEPRAZOLE 20 MG PO CPDR Oral Take 20 mg by mouth 2 (two) times daily.      Marland Kitchen POTASSIUM CHLORIDE 20 MEQ PO PACK Oral Take 20 mEq by mouth daily.      Marland Kitchen RISPERIDONE 2 MG PO TABS Oral Take 2 mg by mouth at bedtime.    . TAMSULOSIN HCL 0.4 MG PO CAPS Oral Take by mouth.    . TRAZODONE HCL 100 MG PO TABS Oral Take 100 mg by mouth at bedtime.      Marland Kitchen HALOPERIDOL 2 MG PO TABS Oral Take 2 mg by mouth every 6 (six) hours as needed. agitation    . RISPERIDONE 1 MG PO TABS Oral Take 1 mg by mouth every morning.       BP 139/92  Pulse 96  Temp(Src) 98.5 F (36.9 C) (Oral)  Resp 18  SpO2 95%  Physical Exam    Nursing note and vitals reviewed. Constitutional: He appears well-developed and well-nourished. No distress.  HENT:  Head: Normocephalic and atraumatic.  Right Ear: Tympanic membrane, external ear and ear canal normal.  Left Ear: Tympanic membrane, external ear and ear canal normal.  Nose: Nose normal.  Mouth/Throat: Uvula is midline, oropharynx is clear and moist and mucous membranes are normal. No oropharyngeal exudate, posterior oropharyngeal edema or posterior oropharyngeal erythema.  Neck: Neck supple.  Cardiovascular: Normal rate, regular rhythm and normal heart sounds.   Pulmonary/Chest: Effort normal. No respiratory distress. He has decreased breath sounds. He has wheezes. He has no rhonchi. He has no rales.  Musculoskeletal:       Left hip: He exhibits tenderness. He exhibits normal range of motion and no deformity.       Legs: Lymphadenopathy:    He has no cervical adenopathy.  Neurological: He is alert.  Skin: Skin is warm and dry.  Psychiatric: He has a normal mood and affect.    ED Course  Procedures (including critical care time)  Labs Reviewed - No data to display No results found.   No diagnosis found.    MDM   Xray reviewed by radiologist and myself. Istat Neg. After NMT pt reports symptomatic improvement. Lungs CTA.  Discussed with Dr Brooke Dare at ED possible social worker consult - concern for pt safety due to medication noncompliance (out of meds without seeking refills x 1 mos), and fall injury w/o seeking medical attn. Caregiver asked pt in my presence why he didn't tell him he fell, and pt stated "I did." Caregiver then said "that's what happens when you fake hurting all the time. I don't know when you really do have pain." Pt is a resident at [facility].        Melody Comas, [place] 04/10/11 2244

## 2011-04-11 ENCOUNTER — Inpatient Hospital Stay (HOSPITAL_COMMUNITY): Payer: Medicare Other | Admitting: Critical Care Medicine

## 2011-04-11 ENCOUNTER — Encounter (HOSPITAL_COMMUNITY): Payer: Self-pay | Admitting: Critical Care Medicine

## 2011-04-11 ENCOUNTER — Emergency Department (HOSPITAL_COMMUNITY): Payer: Medicare Other

## 2011-04-11 ENCOUNTER — Encounter (HOSPITAL_COMMUNITY): Payer: Self-pay | Admitting: Radiology

## 2011-04-11 ENCOUNTER — Inpatient Hospital Stay (HOSPITAL_COMMUNITY): Payer: Medicare Other

## 2011-04-11 ENCOUNTER — Encounter (HOSPITAL_COMMUNITY)
Admission: EM | Disposition: A | Discharge status: Home Health | Payer: Self-pay | Source: Home / Self Care | Attending: Internal Medicine

## 2011-04-11 DIAGNOSIS — S72002A Fracture of unspecified part of neck of left femur, initial encounter for closed fracture: Secondary | ICD-10-CM | POA: Diagnosis present

## 2011-04-11 HISTORY — PX: HIP PINNING,CANNULATED: SHX1758

## 2011-04-11 LAB — CBC
HCT: 34.7 % — ABNORMAL LOW (ref 39.0–52.0)
HCT: 36.2 % — ABNORMAL LOW (ref 39.0–52.0)
Hemoglobin: 11.1 g/dL — ABNORMAL LOW (ref 13.0–17.0)
Hemoglobin: 11.6 g/dL — ABNORMAL LOW (ref 13.0–17.0)
MCH: 26.5 pg (ref 26.0–34.0)
MCHC: 32 g/dL (ref 30.0–36.0)
MCV: 82.8 fL (ref 78.0–100.0)
Platelets: 163 10*3/uL (ref 150–400)
RBC: 4.19 MIL/uL — ABNORMAL LOW (ref 4.22–5.81)
RDW: 15.5 % (ref 11.5–15.5)
WBC: 7.6 10*3/uL (ref 4.0–10.5)
WBC: 8.8 10*3/uL (ref 4.0–10.5)

## 2011-04-11 LAB — CREATININE, SERUM: GFR calc non Af Amer: >90 mL/min (ref >90)

## 2011-04-11 LAB — TYPE AND SCREEN
ABO/RH(D): A POS
Antibody Screen: NEGATIVE

## 2011-04-11 LAB — COMPREHENSIVE METABOLIC PANEL
BUN: 9 mg/dL (ref 6–23)
CO2: 25 mEq/L (ref 19–32)
Chloride: 102 mEq/L (ref 96–112)
Creatinine, Ser: 0.78 mg/dL (ref 0.50–1.35)
GFR calc Af Amer: >90 mL/min (ref >90)
GFR calc non Af Amer: >90 mL/min (ref >90)
Glucose, Bld: 96 mg/dL (ref 70–99)
Total Bilirubin: 0.4 mg/dL (ref 0.3–1.2)
Total Protein: 6.7 g/dL (ref 6.0–8.3)

## 2011-04-11 LAB — DIFFERENTIAL
Lymphocytes Relative: 16 % (ref 12–46)
Monocytes Absolute: 1.1 10*3/uL — ABNORMAL HIGH (ref 0.1–1.0)
Monocytes Relative: 12 % (ref 3–12)
Neutro Abs: 5.9 10*3/uL (ref 1.7–7.7)

## 2011-04-11 LAB — URINALYSIS, ROUTINE W REFLEX MICROSCOPIC
Bilirubin Urine: NEGATIVE
Hgb urine dipstick: NEGATIVE
Ketones, ur: NEGATIVE mg/dL
Protein, ur: NEGATIVE mg/dL
Specific Gravity, Urine: 1.005 (ref 1.005–1.030)
Urobilinogen, UA: 0.2 mg/dL (ref 0.0–1.0)

## 2011-04-11 LAB — LITHIUM LEVEL: Lithium Lvl: 0.46 mEq/L — ABNORMAL LOW (ref 0.80–1.40)

## 2011-04-11 LAB — ABO/RH: ABO/RH(D): A POS

## 2011-04-11 SURGERY — FIXATION, FEMUR, NECK, PERCUTANEOUS, USING SCREW
Anesthesia: General | Site: Hip | Laterality: Left | Wound class: Clean

## 2011-04-11 MED ORDER — LITHIUM CARBONATE 300 MG PO CAPS
300.0000 mg | ORAL_CAPSULE | Freq: Three times a day (TID) | ORAL | Status: DC
  Administered 2011-04-11 – 2011-04-19 (×22): 300 mg via ORAL
  Filled 2011-04-11 (×30): qty 1

## 2011-04-11 MED ORDER — OXYCODONE HCL 5 MG PO TABS
5.0000 mg | ORAL_TABLET | ORAL | Status: DC | PRN
  Administered 2011-04-14: 10 mg via ORAL
  Filled 2011-04-11: qty 2

## 2011-04-11 MED ORDER — DIPHENHYDRAMINE HCL 25 MG PO CAPS
50.0000 mg | ORAL_CAPSULE | Freq: Every evening | ORAL | Status: DC | PRN
  Filled 2011-04-11: qty 1

## 2011-04-11 MED ORDER — CEFAZOLIN SODIUM 1-5 GM-% IV SOLN
1.0000 g | Freq: Four times a day (QID) | INTRAVENOUS | Status: AC
  Administered 2011-04-12 (×3): 1 g via INTRAVENOUS
  Filled 2011-04-11 (×4): qty 50

## 2011-04-11 MED ORDER — LACTATED RINGERS IV SOLN
INTRAVENOUS | Status: DC | PRN
  Administered 2011-04-11: 20:00:00 via INTRAVENOUS

## 2011-04-11 MED ORDER — CHLORHEXIDINE GLUCONATE CLOTH 2 % EX PADS
6.0000 | MEDICATED_PAD | Freq: Every day | CUTANEOUS | Status: AC
  Administered 2011-04-11 – 2011-04-15 (×5): 6 via TOPICAL

## 2011-04-11 MED ORDER — LORAZEPAM 2 MG/ML IJ SOLN
INTRAMUSCULAR | Status: AC
  Filled 2011-04-11: qty 1

## 2011-04-11 MED ORDER — ALUM & MAG HYDROXIDE-SIMETH 200-200-20 MG/5ML PO SUSP: 30.0000 mL | Freq: Four times a day (QID) | ORAL | Status: DC | PRN

## 2011-04-11 MED ORDER — PANTOPRAZOLE SODIUM 40 MG PO TBEC
40.0000 mg | DELAYED_RELEASE_TABLET | Freq: Every day | ORAL | Status: DC
  Administered 2011-04-12 – 2011-04-18 (×7): 40 mg via ORAL
  Filled 2011-04-11 (×8): qty 1

## 2011-04-11 MED ORDER — METHYLPREDNISOLONE SODIUM SUCC 40 MG IJ SOLR
40.0000 mg | Freq: Two times a day (BID) | INTRAMUSCULAR | Status: DC
  Filled 2011-04-11 (×3): qty 1

## 2011-04-11 MED ORDER — MUPIROCIN 2 % EX OINT
1.0000 "application " | TOPICAL_OINTMENT | Freq: Two times a day (BID) | CUTANEOUS | Status: AC
  Administered 2011-04-11 – 2011-04-15 (×9): 1 via NASAL
  Filled 2011-04-11 (×2): qty 22

## 2011-04-11 MED ORDER — ROCURONIUM BROMIDE 100 MG/10ML IV SOLN
INTRAVENOUS | Status: DC | PRN
  Administered 2011-04-11: 10 mg via INTRAVENOUS
  Administered 2011-04-11: 50 mg via INTRAVENOUS

## 2011-04-11 MED ORDER — GLYCOPYRROLATE 0.2 MG/ML IJ SOLN
INTRAMUSCULAR | Status: DC | PRN
  Administered 2011-04-11: 0.6 mg via INTRAVENOUS

## 2011-04-11 MED ORDER — ONDANSETRON HCL 4 MG/2ML IJ SOLN: 4.0000 mg | Freq: Once | INTRAMUSCULAR | Status: DC | PRN

## 2011-04-11 MED ORDER — SODIUM CHLORIDE 0.9 % IV SOLN
500.0000 mg | Freq: Once | INTRAVENOUS | Status: AC
  Administered 2011-04-11: 500 mg via INTRAVENOUS
  Filled 2011-04-11: qty 5

## 2011-04-11 MED ORDER — AMLODIPINE BESYLATE 10 MG PO TABS
10.0000 mg | ORAL_TABLET | Freq: Every day | ORAL | Status: DC
  Administered 2011-04-11 – 2011-04-19 (×9): 10 mg via ORAL
  Filled 2011-04-11 (×10): qty 1

## 2011-04-11 MED ORDER — CEFAZOLIN SODIUM 1-5 GM-% IV SOLN
INTRAVENOUS | Status: DC | PRN
  Administered 2011-04-11: 2 g via INTRAVENOUS

## 2011-04-11 MED ORDER — METOCLOPRAMIDE HCL 5 MG/ML IJ SOLN
5.0000 mg | Freq: Three times a day (TID) | INTRAMUSCULAR | Status: DC | PRN
  Filled 2011-04-11: qty 2

## 2011-04-11 MED ORDER — LISINOPRIL 10 MG PO TABS
10.0000 mg | ORAL_TABLET | Freq: Every day | ORAL | Status: DC
  Administered 2011-04-12 – 2011-04-15 (×4): 10 mg via ORAL
  Filled 2011-04-11 (×5): qty 1

## 2011-04-11 MED ORDER — ALBUTEROL SULFATE (5 MG/ML) 0.5% IN NEBU
2.5000 mg | INHALATION_SOLUTION | RESPIRATORY_TRACT | Status: DC | PRN
  Filled 2011-04-11: qty 0.5

## 2011-04-11 MED ORDER — LORAZEPAM 2 MG/ML IJ SOLN
2.0000 mg | Freq: Once | INTRAMUSCULAR | Status: AC
  Administered 2011-04-11: 1 mg via INTRAVENOUS
  Filled 2011-04-11: qty 1

## 2011-04-11 MED ORDER — HYDROCODONE-ACETAMINOPHEN 5-325 MG PO TABS
1.0000 | ORAL_TABLET | ORAL | Status: DC | PRN
  Administered 2011-04-14: 2 via ORAL
  Administered 2011-04-15 (×2): 1 via ORAL
  Administered 2011-04-16 – 2011-04-18 (×3): 2 via ORAL
  Administered 2011-04-19: 1 via ORAL
  Filled 2011-04-11: qty 2
  Filled 2011-04-11: qty 1
  Filled 2011-04-11: qty 2
  Filled 2011-04-11: qty 1
  Filled 2011-04-11: qty 2
  Filled 2011-04-11: qty 1
  Filled 2011-04-11 (×2): qty 2

## 2011-04-11 MED ORDER — IPRATROPIUM BROMIDE 0.02 % IN SOLN
0.5000 mg | RESPIRATORY_TRACT | Status: DC | PRN
  Filled 2011-04-11: qty 2.5

## 2011-04-11 MED ORDER — ENOXAPARIN SODIUM 40 MG/0.4ML ~~LOC~~ SOLN
40.0000 mg | SUBCUTANEOUS | Status: DC
  Administered 2011-04-11 – 2011-04-16 (×6): 40 mg via SUBCUTANEOUS
  Filled 2011-04-11 (×8): qty 0.4

## 2011-04-11 MED ORDER — LISINOPRIL 5 MG PO TABS
5.0000 mg | ORAL_TABLET | Freq: Every day | ORAL | Status: DC
  Administered 2011-04-11: 5 mg via ORAL
  Filled 2011-04-11 (×2): qty 1

## 2011-04-11 MED ORDER — KCL IN DEXTROSE-NACL 20-5-0.45 MEQ/L-%-% IV SOLN
INTRAVENOUS | Status: DC
Start: 2011-04-11 — End: 2011-04-13
  Administered 2011-04-12: 100 mL/h via INTRAVENOUS
  Administered 2011-04-12: 01:00:00 via INTRAVENOUS
  Filled 2011-04-11 (×4): qty 1000

## 2011-04-11 MED ORDER — HYDROMORPHONE HCL PF 1 MG/ML IJ SOLN: 0.2500 mg | INTRAMUSCULAR | Status: DC | PRN

## 2011-04-11 MED ORDER — POTASSIUM CHLORIDE IN NACL 20-0.9 MEQ/L-% IV SOLN
INTRAVENOUS | Status: DC
  Administered 2011-04-11: 06:00:00 via INTRAVENOUS
  Filled 2011-04-11 (×2): qty 1000

## 2011-04-11 MED ORDER — TRAZODONE HCL 100 MG PO TABS
100.0000 mg | ORAL_TABLET | Freq: Every day | ORAL | Status: DC
  Administered 2011-04-12 – 2011-04-18 (×6): 100 mg via ORAL
  Filled 2011-04-11 (×9): qty 1

## 2011-04-11 MED ORDER — ONDANSETRON HCL 4 MG/2ML IJ SOLN: 4.0000 mg | Freq: Four times a day (QID) | INTRAMUSCULAR | Status: DC | PRN

## 2011-04-11 MED ORDER — SODIUM CHLORIDE 0.9 % IV SOLN
1000.0000 mL | INTRAVENOUS | Status: DC
  Administered 2011-04-11: 1000 mL via INTRAVENOUS
  Administered 2011-04-11: 500 mL via INTRAVENOUS

## 2011-04-11 MED ORDER — LORAZEPAM 2 MG/ML IJ SOLN
1.0000 mg | INTRAMUSCULAR | Status: DC | PRN
  Administered 2011-04-11 (×2): 2 mg via INTRAVENOUS
  Filled 2011-04-11: qty 1

## 2011-04-11 MED ORDER — METOCLOPRAMIDE HCL 5 MG PO TABS
5.0000 mg | ORAL_TABLET | Freq: Three times a day (TID) | ORAL | Status: DC | PRN
  Filled 2011-04-11: qty 2

## 2011-04-11 MED ORDER — ALBUTEROL SULFATE (5 MG/ML) 0.5% IN NEBU
2.5000 mg | INHALATION_SOLUTION | Freq: Four times a day (QID) | RESPIRATORY_TRACT | Status: DC
  Administered 2011-04-11 – 2011-04-12 (×3): 2.5 mg via RESPIRATORY_TRACT
  Filled 2011-04-11 (×2): qty 0.5

## 2011-04-11 MED ORDER — RISPERIDONE 2 MG PO TABS
2.0000 mg | ORAL_TABLET | Freq: Every day | ORAL | Status: DC
  Administered 2011-04-11 – 2011-04-18 (×7): 2 mg via ORAL
  Filled 2011-04-11 (×12): qty 1

## 2011-04-11 MED ORDER — HALOPERIDOL LACTATE 5 MG/ML IJ SOLN
1.0000 mg | Freq: Four times a day (QID) | INTRAMUSCULAR | Status: DC | PRN
  Administered 2011-04-11 – 2011-04-13 (×2): 2 mg via INTRAVENOUS
  Filled 2011-04-11 (×2): qty 1

## 2011-04-11 MED ORDER — MORPHINE SULFATE 2 MG/ML IJ SOLN: 2.0000 mg | INTRAMUSCULAR | Status: DC | PRN

## 2011-04-11 MED ORDER — FENTANYL CITRATE 0.05 MG/ML IJ SOLN
INTRAMUSCULAR | Status: DC | PRN
  Administered 2011-04-11: 150 ug via INTRAVENOUS
  Administered 2011-04-11: 50 ug via INTRAVENOUS

## 2011-04-11 MED ORDER — TAMSULOSIN HCL 0.4 MG PO CAPS
0.4000 mg | ORAL_CAPSULE | Freq: Every day | ORAL | Status: DC
  Administered 2011-04-11 – 2011-04-19 (×9): 0.4 mg via ORAL
  Filled 2011-04-11 (×10): qty 1

## 2011-04-11 MED ORDER — ACETAMINOPHEN 325 MG PO TABS: 650.0000 mg | ORAL_TABLET | Freq: Four times a day (QID) | ORAL | Status: DC | PRN

## 2011-04-11 MED ORDER — EPHEDRINE SULFATE 50 MG/ML IJ SOLN
INTRAMUSCULAR | Status: DC | PRN
  Administered 2011-04-11 (×2): 10 mg via INTRAVENOUS

## 2011-04-11 MED ORDER — KCL IN DEXTROSE-NACL 20-5-0.9 MEQ/L-%-% IV SOLN
INTRAVENOUS | Status: DC
  Administered 2011-04-11: 13:00:00 via INTRAVENOUS
  Filled 2011-04-11 (×2): qty 1000

## 2011-04-11 MED ORDER — 0.9 % SODIUM CHLORIDE (POUR BTL) OPTIME
TOPICAL | Status: DC | PRN
  Administered 2011-04-11: 1000 mL

## 2011-04-11 MED ORDER — CEFAZOLIN SODIUM 1-5 GM-% IV SOLN
INTRAVENOUS | Status: AC
  Filled 2011-04-11: qty 100

## 2011-04-11 MED ORDER — IPRATROPIUM BROMIDE 0.02 % IN SOLN
0.5000 mg | Freq: Four times a day (QID) | RESPIRATORY_TRACT | Status: DC
  Administered 2011-04-11 – 2011-04-12 (×3): 0.5 mg via RESPIRATORY_TRACT
  Filled 2011-04-11 (×2): qty 2.5

## 2011-04-11 MED ORDER — BUDESONIDE 0.5 MG/2ML IN SUSP
0.5000 mg | Freq: Two times a day (BID) | RESPIRATORY_TRACT | Status: DC
  Administered 2011-04-12 – 2011-04-18 (×12): 0.5 mg via RESPIRATORY_TRACT
  Filled 2011-04-11 (×17): qty 2

## 2011-04-11 MED ORDER — PROPOFOL 10 MG/ML IV EMUL
INTRAVENOUS | Status: DC | PRN
  Administered 2011-04-11: 100 mg via INTRAVENOUS

## 2011-04-11 MED ORDER — ONDANSETRON HCL 4 MG PO TABS
4.0000 mg | ORAL_TABLET | Freq: Four times a day (QID) | ORAL | Status: DC | PRN
  Filled 2011-04-11: qty 1

## 2011-04-11 MED ORDER — MIDAZOLAM HCL 5 MG/5ML IJ SOLN
INTRAMUSCULAR | Status: DC | PRN
  Administered 2011-04-11 (×2): 1 mg via INTRAVENOUS

## 2011-04-11 MED ORDER — ONDANSETRON HCL 4 MG/2ML IJ SOLN
INTRAMUSCULAR | Status: DC | PRN
  Administered 2011-04-11: 4 mg via INTRAVENOUS

## 2011-04-11 MED ORDER — NEOSTIGMINE METHYLSULFATE 1 MG/ML IJ SOLN
INTRAMUSCULAR | Status: DC | PRN
  Administered 2011-04-11: 5 mg via INTRAVENOUS

## 2011-04-11 MED ORDER — HYDROCODONE-ACETAMINOPHEN 5-325 MG PO TABS: 1.0000 | ORAL_TABLET | ORAL | Status: DC | PRN

## 2011-04-11 MED ORDER — LEVOTHYROXINE SODIUM 75 MCG PO TABS
75.0000 ug | ORAL_TABLET | Freq: Every day | ORAL | Status: DC
  Administered 2011-04-11 – 2011-04-19 (×9): 75 ug via ORAL
  Filled 2011-04-11 (×10): qty 1

## 2011-04-11 MED ORDER — ACETAMINOPHEN 650 MG RE SUPP: 650.0000 mg | Freq: Four times a day (QID) | RECTAL | Status: DC | PRN

## 2011-04-11 MED ORDER — MORPHINE SULFATE 2 MG/ML IJ SOLN
2.0000 mg | INTRAMUSCULAR | Status: DC | PRN
  Administered 2011-04-13: 2 mg via INTRAVENOUS
  Filled 2011-04-11: qty 1

## 2011-04-11 MED ORDER — SODIUM CHLORIDE 0.9 % IV SOLN
500.0000 mg | INTRAVENOUS | Status: AC
  Administered 2011-04-11: 500 mg via INTRAVENOUS
  Filled 2011-04-11: qty 5

## 2011-04-11 MED ORDER — RISPERIDONE 1 MG PO TABS
1.0000 mg | ORAL_TABLET | Freq: Every day | ORAL | Status: DC
  Administered 2011-04-12: 11:00:00 via ORAL
  Administered 2011-04-13 – 2011-04-19 (×7): 1 mg via ORAL
  Filled 2011-04-11 (×9): qty 1

## 2011-04-11 MED ORDER — PHENOL 1.4 % MT LIQD: 1.0000 | OROMUCOSAL | Status: DC | PRN

## 2011-04-11 MED ORDER — ALBUTEROL SULFATE (5 MG/ML) 0.5% IN NEBU: 2.5000 mg | INHALATION_SOLUTION | RESPIRATORY_TRACT | Status: DC | PRN

## 2011-04-11 MED ORDER — MENTHOL 3 MG MT LOZG: 1.0000 | LOZENGE | OROMUCOSAL | Status: DC | PRN

## 2011-04-11 MED ORDER — MORPHINE SULFATE 2 MG/ML IJ SOLN: 0.0500 mg/kg | INTRAMUSCULAR | Status: DC | PRN

## 2011-04-11 MED ORDER — ONDANSETRON HCL 4 MG PO TABS: 4.0000 mg | ORAL_TABLET | Freq: Four times a day (QID) | ORAL | Status: DC | PRN

## 2011-04-11 MED ORDER — SODIUM CHLORIDE 0.9 % IV SOLN
500.0000 mg | Freq: Two times a day (BID) | INTRAVENOUS | Status: DC
  Administered 2011-04-11 – 2011-04-12 (×2): 500 mg via INTRAVENOUS
  Filled 2011-04-11 (×4): qty 5

## 2011-04-11 MED ORDER — NICOTINE 14 MG/24HR TD PT24
14.0000 mg | MEDICATED_PATCH | Freq: Every day | TRANSDERMAL | Status: DC
  Administered 2011-04-11 – 2011-04-19 (×9): 14 mg via TRANSDERMAL
  Filled 2011-04-11 (×9): qty 1

## 2011-04-11 SURGICAL SUPPLY — 44 items
BANDAGE GAUZE ELAST BULKY 4 IN (GAUZE/BANDAGES/DRESSINGS) ×2 IMPLANT
BIT DRILL CANN LRG QC 5X300 (BIT) ×1 IMPLANT
BLADE SURG 10 STRL SS (BLADE) ×2 IMPLANT
BNDG COHESIVE 4X5 TAN STRL (GAUZE/BANDAGES/DRESSINGS) ×2 IMPLANT
CHLORAPREP W/TINT 26ML (MISCELLANEOUS) ×2 IMPLANT
CLOTH BEACON ORANGE TIMEOUT ST (SAFETY) ×2 IMPLANT
COVER MAYO STAND STRL (DRAPES) ×2 IMPLANT
COVER SURGICAL LIGHT HANDLE (MISCELLANEOUS) ×2 IMPLANT
DRAPE ORTHO SPLIT 77X108 STRL (DRAPES) ×4
DRAPE STERI IOBAN 125X83 (DRAPES) ×2 IMPLANT
DRAPE SURG 17X23 STRL (DRAPES) ×8 IMPLANT
DRAPE SURG ORHT 6 SPLT 77X108 (DRAPES) ×2 IMPLANT
DRSG MEPILEX BORDER 4X4 (GAUZE/BANDAGES/DRESSINGS) ×2 IMPLANT
DRSG PAD ABDOMINAL 8X10 ST (GAUZE/BANDAGES/DRESSINGS) ×6 IMPLANT
ELECT REM PT RETURN 9FT ADLT (ELECTROSURGICAL) ×2
ELECTRODE REM PT RTRN 9FT ADLT (ELECTROSURGICAL) ×1 IMPLANT
GLOVE BIO SURGEON STRL SZ 6.5 (GLOVE) ×1 IMPLANT
GLOVE BIO SURGEON STRL SZ7.5 (GLOVE) ×2 IMPLANT
GLOVE BIOGEL PI IND STRL 7.0 (GLOVE) IMPLANT
GLOVE BIOGEL PI IND STRL 8 (GLOVE) ×1 IMPLANT
GLOVE BIOGEL PI INDICATOR 7.0 (GLOVE) ×1
GLOVE BIOGEL PI INDICATOR 8 (GLOVE) ×1
GOWN PREVENTION PLUS XLARGE (GOWN DISPOSABLE) ×4 IMPLANT
GOWN STRL NON-REIN LRG LVL3 (GOWN DISPOSABLE) ×2 IMPLANT
GUIDEWIRE BALL NOSE 80CM (WIRE) ×2 IMPLANT
GUIDEWIRE THREADED 2.8 (WIRE) ×3 IMPLANT
KIT ROOM TURNOVER OR (KITS) ×2 IMPLANT
MANIFOLD NEPTUNE II (INSTRUMENTS) ×2 IMPLANT
NS IRRIG 1000ML POUR BTL (IV SOLUTION) ×2 IMPLANT
PACK GENERAL/GYN (CUSTOM PROCEDURE TRAY) ×2 IMPLANT
PAD ARMBOARD 7.5X6 YLW CONV (MISCELLANEOUS) ×4 IMPLANT
PAD CAST 4YDX4 CTTN HI CHSV (CAST SUPPLIES) ×1 IMPLANT
PADDING CAST COTTON 4X4 STRL (CAST SUPPLIES) ×2
SCREW CANN 16 THRD/85 7.3 (Screw) ×2 IMPLANT
SCREW CANN 16 THRD/90 7.3 (Screw) ×1 IMPLANT
SLEEVE SURGEON STRL (DRAPES) ×1 IMPLANT
STAPLER VISISTAT 35W (STAPLE) ×2 IMPLANT
SUT VIC AB 1 CT1 27 (SUTURE) ×2
SUT VIC AB 1 CT1 27XBRD ANBCTR (SUTURE) ×1 IMPLANT
SUT VIC AB 2-0 CT1 27 (SUTURE) ×2
SUT VIC AB 2-0 CT1 TAPERPNT 27 (SUTURE) ×1 IMPLANT
TOWEL OR 17X24 6PK STRL BLUE (TOWEL DISPOSABLE) ×2 IMPLANT
TOWEL OR 17X26 10 PK STRL BLUE (TOWEL DISPOSABLE) ×2 IMPLANT
WATER STERILE IRR 1000ML POUR (IV SOLUTION) ×2 IMPLANT

## 2011-04-11 NOTE — Progress Notes (Signed)
Utilization Review Completed.[name] T4/10/2011   

## 2011-04-11 NOTE — ED Notes (Signed)
Patient had seizure like activity x 2 for approx 15 seconds each, dr pickering aware. Pt was head to right arm drawn and drooling and not responding to sttimuli

## 2011-04-11 NOTE — Progress Notes (Signed)
Utilization Review Completed.Morgen Linebaugh T4/10/2011   

## 2011-04-11 NOTE — Anesthesia Preprocedure Evaluation (Addendum)
Anesthesia Evaluation  Patient identified by MRN, date of birth, ID band Patient awake    Reviewed: Allergy & Precautions, H&P , NPO status , Patient's Chart, lab work & pertinent test results  Airway Mallampati: I TM Distance: >3 FB Neck ROM: Full    Dental  (+) Edentulous Upper and Edentulous Lower   Pulmonary COPD         Cardiovascular hypertension,     Neuro/Psych Seizures -, Poorly Controlled,  PSYCHIATRIC DISORDERS    GI/Hepatic PUD,   Endo/Other  Hypothyroidism   Renal/GU      Musculoskeletal   Abdominal   Peds  Hematology   Anesthesia Other Findings   Reproductive/Obstetrics                          Anesthesia Physical Anesthesia Plan  ASA: III and Emergent  Anesthesia Plan: General   Post-op Pain Management:    Induction: Intravenous  Airway Management Planned: Oral ETT  Additional Equipment:   Intra-op Plan:   Post-operative Plan: Extubation in OR  Informed Consent: I have reviewed the patients History and Physical, chart, labs and discussed the procedure including the risks, benefits and alternatives for the proposed anesthesia with the patient or authorized representative who has indicated his/her understanding and acceptance.     Plan Discussed with: CRNA and Surgeon  Anesthesia Plan Comments:        Anesthesia Quick Evaluation

## 2011-04-11 NOTE — Progress Notes (Signed)
Bethany RN at bedside with pt while he was A&O communicating and then began to have a seizure lasting approx with slowing shaking and jerking of all extremeties and leaning towards the left. Pulse rate increased to 100 bpm. Pt began to regain LOC and follow commands. Dr Sharon Seller notified, orders given. Pt resting at present with no complaints. Will continue to monitor. [place], [place] M

## 2011-04-11 NOTE — ED Provider Notes (Signed)
History     CSN: [id number]  Arrival date & time 04/10/11  2250   First MD Initiated Contact with Patient 04/11/11 0010      Chief Complaint  Patient presents with  . hip fracture    level V caveat to 2 mental retardation  (Consider location/radiation/quality/duration/timing/severity/associated sxs/prior treatment) The history is provided by the patient and the nursing home.   patient came with shortness of breath. Reportedly has had cough. Patient is a very poor historian. Also complaining of left hip pain after fall. He states he has been ambulating. Patient was seen in urgent care had an x-ray done that showed possible left hip fracture. CT will be done to verify.  Past Medical History  Diagnosis Date  . Seizures   . Psychiatric disorder   . Schizoaffective disorder   . Organic mood disorder   . Intermittent explosive disorder   . Generalized anxiety disorder   . Mild mental retardation   . COPD (chronic obstructive pulmonary disease)   . Hypertension   . Peptic ulcer   . Thyroid disease   . Prostate hyperplasia with urinary obstruction   . Blind left eye   . Chronic respiratory failure     History reviewed. No pertinent past surgical history.  Family History  Problem Relation Age of Onset  . Hypertension      History  Substance Use Topics  . Smoking status: Current Everyday Smoker -- 0.2 packs/day    Types: Cigarettes  . Smokeless tobacco: Never Used  . Alcohol Use: No      Review of Systems  Unable to perform ROS: Psychiatric disorder    Allergies  Peanut-containing drug products; Coffee bean extract; Prolixin; Thorazine; and Lactose intolerance (gi)  Home Medications   No current outpatient prescriptions on file.  BP 134/82  Pulse 78  Temp(Src) 98.2 F (36.8 C) (Oral)  Resp 23  Ht 5\' 10"  (1.778 m)  Wt 155 lb 13.8 oz (70.7 kg)  BMI 22.36 kg/m2  SpO2 92%  Physical Exam  Constitutional: He appears well-developed and well-nourished.  HENT:    Head: Normocephalic.  Cardiovascular: Normal rate and regular rhythm.   Pulmonary/Chest: Effort normal. He has wheezes.       Diffuse wheezing  Abdominal: Soft. He exhibits no distension. There is no tenderness.  Musculoskeletal: Normal range of motion.       Range of motion intact left hip. Patient sitting with knees bent hips flexed on my examination. He does have some tenderness lateral to the hip. Neurovascular intact distally.  Neurological: He is alert.       Patient is reportedly at his baseline.  Skin: Skin is warm.    ED Course  Procedures (including critical care time)  Labs Reviewed  CBC - Abnormal; Notable for the following:    Hemoglobin 11.6 (*)    HCT 36.2 (*)    RDW 15.6 (*)    All other components within normal limits  DIFFERENTIAL - Abnormal; Notable for the following:    Monocytes Absolute 1.1 (*)    All other components within normal limits  COMPREHENSIVE METABOLIC PANEL - Abnormal; Notable for the following:    Albumin 3.4 (*)    All other components within normal limits  LITHIUM LEVEL - Abnormal; Notable for the following:    Lithium Lvl 0.46 (*)    All other components within normal limits  TYPE AND SCREEN  URINALYSIS, ROUTINE W REFLEX MICROSCOPIC  ABO/RH  CBC  CREATININE, SERUM  BASIC  METABOLIC PANEL  CBC  PROTIME-INR   Dg Chest 2 View  04/11/2011  *RADIOLOGY REPORT*  Clinical Data: Left-sided chest pain and cough.  CHEST - 2 VIEW  Comparison: Chest radiograph and CTA of the chest performed 03/18/2011  Findings: The lungs are hyperexpanded, with flattening of the hemidiaphragms, compatible with COPD.  Chronic peribronchial thickening is noted.  The distribution of scarring and atelectasis is grossly unchanged.  There is no evidence of focal opacification, pleural effusion or pneumothorax.  The heart is normal in size; the mediastinal contour is within normal limits. The known enlarged right hilar node is not well characterized on radiograph.  No acute  osseous abnormalities are seen.  IMPRESSION:  1.  No acute cardiopulmonary process seen. 2.  Findings of COPD; chronic lung changes seen.  Original Report Authenticated By: Tonia Ghent, M.D.   Dg Hip Complete Left  04/10/2011  *RADIOLOGY REPORT*  Clinical Data: Fall and left groin pain.  LEFT HIP - COMPLETE 2+ VIEW  Comparison: None.  Findings: AP view of the pelvis and two views of the left hip were obtained.  There appears to be foreshortening of the left femoral neck and concern for a subcapital fracture.  There is sclerosis and irregularity of the left femoral neck.  Left hip is located.  The pelvic bony ring is intact.  No gross abnormality to the right hip. Large amount of stool in the right abdomen and pelvis.  IMPRESSION: Concern for a left femoral neck subcapital fracture. This finding could be confirmed with CT.  Original Report Authenticated By: Richarda Overlie, M.D.   Ct Hip Left Wo Contrast  04/11/2011  *RADIOLOGY REPORT*  Clinical Data: Status post fall; evaluate left hip fracture.  CT OF THE LEFT HIP WITHOUT CONTRAST  Technique:  Multidetector CT imaging was performed according to the standard protocol. Multiplanar CT image reconstructions were also generated.  Comparison: Left hip radiographs performed 04/10/2011  Findings: There is a slightly comminuted subcapital fracture through the left femoral neck, with mild associated impaction.  The left femoral head remains seated at the acetabulum.  No additional fractures are identified.  An associated small left hip joint effusion is seen.  The left superior and inferior pubic rami remain intact.  The left acetabulum is within normal limits.  A 2.0 cm sclerotic focus within the left ilium adjacent to the sacroiliac joint likely reflects a large bone island, as does a 1.4 cm sclerotic focus at the right sacrum.  The left sacroiliac joint is unremarkable in appearance.  Mild scattered vascular calcifications are seen.  The visualized small and large bowel  loops are grossly unremarkable.  The prostate appears mildly enlarged, though not well assessed.  No significant abnormalities are seen with respect to the visualized musculature.  IMPRESSION:  1.  Slightly comminuted subcapital fracture through the left femoral neck, with mild associated impaction.  No evidence of dislocation. 2.  Associated small left hip joint effusion seen. 3.  Sclerotic foci within the left ilium and right sacrum, measuring up to 2.0 cm, likely reflect bone islands. 4.  Mild scattered vascular calcifications seen. 5.  Mildly enlarged prostate.  Original Report Authenticated By: Tonia Ghent, M.D.     1. Hip fracture, left       MDM  Patient sent from [facility] urgent care. Apparently been complaining of a cough. Also some left hip pain. He is a history of mental retardation. X-ray showed a possible hip fracture in urgent care. He is transferred down. CT  was done for confirmation. It showed a left subcapital hip fracture. After discussion with orthopedic surgery the patient be admitted to medicine.        Juliet Rude. Rubin Payor, MD 04/11/11 1610

## 2011-04-11 NOTE — ED Notes (Signed)
Pt had 15-30 second seizure with grunting and repetitive right sided movements with drool from mouth.  Pt less oriented than before.  Dr. Delton Coombes notified.  Dr. Delton Coombes also states she will call pt caregiver for more information.

## 2011-04-11 NOTE — Consult Note (Signed)
Reason for Consult: Evaluate left hip fracture Referring Physician: Lockie Mola M.D.  [name] is an 56 y.o. male.  HPI: The patient is a 56 year old gentleman who resides full time in a psychiatric facility. He had a fall about 6 days ago landing on his left side. Since that time he has been walking on it although he's had pain in limb. He presented to the urgent care where there was felt to be a likely impacted femoral neck fracture and was transferred to the ER for CT scan which verified the diagnosis. He has full significant psychiatric diagnoses and is a poor historian.  Past Medical History  Diagnosis Date  . Seizures   . Psychiatric disorder   . Schizoaffective disorder   . Organic mood disorder   . Intermittent explosive disorder   . Generalized anxiety disorder   . Mild mental retardation   . COPD (chronic obstructive pulmonary disease)   . Hypertension   . Peptic ulcer   . Thyroid disease   . Prostate hyperplasia with urinary obstruction   . Blind left eye   . Chronic respiratory failure     History reviewed. No pertinent past surgical history.  Family History  Problem Relation Age of Onset  . Hypertension      Social History:  reports that he has been smoking Cigarettes.  He has been smoking about .25 packs per day. He has never used smokeless tobacco. He reports that he does not drink alcohol or use illicit drugs.  Allergies:  Allergies  Allergen Reactions  . Peanut-Containing Drug Products Anaphylaxis  . Coffee Bean Extract     Per group home owner requested coffee be listed as an allergy due to volatile behavior when the pt takes it  . Prolixin (Fluphenazine Hcl) Other (See Comments)    "muscle cramps"  . Thorazine (Chlorpromazine Hcl) Other (See Comments)    "burns"  . Lactose Intolerance (Gi) Rash    Medications:  Scheduled:   . amLODipine  10 mg Oral Daily  . Chlorhexidine Gluconate Cloth  6 each Topical Q0600  . enoxaparin  40 mg  Subcutaneous Q24H  . levetiracetam  500 mg Intravenous To Major  . levetiracetam  500 mg Intravenous BID  . levothyroxine  75 mcg Oral Daily  . lisinopril  5 mg Oral Daily  . lithium carbonate  300 mg Oral TID WC  . LORazepam      . LORazepam  2 mg Intravenous Once  . methylPREDNISolone (SOLU-MEDROL) injection  40 mg Intravenous Q12H  . mupirocin ointment  1 application Nasal BID  . nicotine  14 mg Transdermal Daily  . pantoprazole  40 mg Oral Q1200  . risperiDONE  1 mg Oral Daily  . risperiDONE  2 mg Oral Q2000  . Tamsulosin HCl  0.4 mg Oral Daily  . traZODone  100 mg Oral QHS    Results for orders placed during the hospital encounter of 04/10/11 (from the past 48 hour(s))  ABO/RH     Status: Normal   Collection Time   04/11/11 12:27 AM      Component Value Range Comment   ABO/RH(D) A POS     TYPE AND SCREEN     Status: Normal   Collection Time   04/11/11 12:28 AM      Component Value Range Comment   ABO/RH(D) A POS      Antibody Screen NEG      Sample Expiration 04/14/2011     CBC  Status: Abnormal   Collection Time   04/11/11 12:29 AM      Component Value Range Comment   WBC 8.8  4.0 - 10.5 (K/uL)    RBC 4.38  4.22 - 5.81 (MIL/uL)    Hemoglobin 11.6 (*) 13.0 - 17.0 (g/dL)    HCT 96.0 (*) 45.4 - 52.0 (%)    MCV 82.6  78.0 - 100.0 (fL)    MCH 26.5  26.0 - 34.0 (pg)    MCHC 32.0  30.0 - 36.0 (g/dL)    RDW 09.8 (*) 11.9 - 15.5 (%)    Platelets 164  150 - 400 (K/uL)   DIFFERENTIAL     Status: Abnormal   Collection Time   04/11/11 12:29 AM      Component Value Range Comment   Neutrophils Relative 67  43 - 77 (%)    Neutro Abs 5.9  1.7 - 7.7 (K/uL)    Lymphocytes Relative 16  12 - 46 (%)    Lymphs Abs 1.4  0.7 - 4.0 (K/uL)    Monocytes Relative 12  3 - 12 (%)    Monocytes Absolute 1.1 (*) 0.1 - 1.0 (K/uL)    Eosinophils Relative 4  0 - 5 (%)    Eosinophils Absolute 0.4  0.0 - 0.7 (K/uL)    Basophils Relative 1  0 - 1 (%)    Basophils Absolute 0.0  0.0 - 0.1 (K/uL)    COMPREHENSIVE METABOLIC PANEL     Status: Abnormal   Collection Time   04/11/11 12:29 AM      Component Value Range Comment   Sodium 136  135 - 145 (mEq/L)    Potassium 3.8  3.5 - 5.1 (mEq/L)    Chloride 102  96 - 112 (mEq/L)    CO2 25  19 - 32 (mEq/L)    Glucose, Bld 96  70 - 99 (mg/dL)    BUN 9  6 - 23 (mg/dL)    Creatinine, Ser 1.47  0.50 - 1.35 (mg/dL)    Calcium 9.5  8.4 - 10.5 (mg/dL)    Total Protein 6.7  6.0 - 8.3 (g/dL)    Albumin 3.4 (*) 3.5 - 5.2 (g/dL)    AST 10  0 - 37 (U/L)    ALT 10  0 - 53 (U/L)    Alkaline Phosphatase 64  39 - 117 (U/L)    Total Bilirubin 0.4  0.3 - 1.2 (mg/dL)    GFR calc non Af Amer >90  >90 (mL/min)    GFR calc Af Amer >90  >90 (mL/min)   LITHIUM LEVEL     Status: Abnormal   Collection Time   04/11/11 12:40 AM      Component Value Range Comment   Lithium Lvl 0.46 (*) 0.80 - 1.40 (mEq/L)   URINALYSIS, ROUTINE W REFLEX MICROSCOPIC     Status: Normal   Collection Time   04/11/11 12:42 AM      Component Value Range Comment   Color, Urine YELLOW  YELLOW     APPearance CLEAR  CLEAR     Specific Gravity, Urine 1.005  1.005 - 1.030     pH 7.0  5.0 - 8.0     Glucose, UA NEGATIVE  NEGATIVE (mg/dL)    Hgb urine dipstick NEGATIVE  NEGATIVE     Bilirubin Urine NEGATIVE  NEGATIVE     Ketones, ur NEGATIVE  NEGATIVE (mg/dL)    Protein, ur NEGATIVE  NEGATIVE (mg/dL)    Urobilinogen, UA  0.2  0.0 - 1.0 (mg/dL)    Nitrite NEGATIVE  NEGATIVE     Leukocytes, UA NEGATIVE  NEGATIVE  MICROSCOPIC NOT DONE ON URINES WITH NEGATIVE PROTEIN, BLOOD, LEUKOCYTES, NITRITE, OR GLUCOSE <1000 mg/dL.    Dg Chest 2 View  04/11/2011  *RADIOLOGY REPORT*  Clinical Data: Left-sided chest pain and cough.  CHEST - 2 VIEW  Comparison: Chest radiograph and CTA of the chest performed 03/18/2011  Findings: The lungs are hyperexpanded, with flattening of the hemidiaphragms, compatible with COPD.  Chronic peribronchial thickening is noted.  The distribution of scarring and atelectasis is  grossly unchanged.  There is no evidence of focal opacification, pleural effusion or pneumothorax.  The heart is normal in size; the mediastinal contour is within normal limits. The known enlarged right hilar node is not well characterized on radiograph.  No acute osseous abnormalities are seen.  IMPRESSION:  1.  No acute cardiopulmonary process seen. 2.  Findings of COPD; chronic lung changes seen.  Original Report Authenticated By: Tonia Ghent, M.D.   Dg Hip Complete Left  04/10/2011  *RADIOLOGY REPORT*  Clinical Data: Fall and left groin pain.  LEFT HIP - COMPLETE 2+ VIEW  Comparison: None.  Findings: AP view of the pelvis and two views of the left hip were obtained.  There appears to be foreshortening of the left femoral neck and concern for a subcapital fracture.  There is sclerosis and irregularity of the left femoral neck.  Left hip is located.  The pelvic bony ring is intact.  No gross abnormality to the right hip. Large amount of stool in the right abdomen and pelvis.  IMPRESSION: Concern for a left femoral neck subcapital fracture. This finding could be confirmed with CT.  Original Report Authenticated By: Richarda Overlie, M.D.   Ct Hip Left Wo Contrast  04/11/2011  *RADIOLOGY REPORT*  Clinical Data: Status post fall; evaluate left hip fracture.  CT OF THE LEFT HIP WITHOUT CONTRAST  Technique:  Multidetector CT imaging was performed according to the standard protocol. Multiplanar CT image reconstructions were also generated.  Comparison: Left hip radiographs performed 04/10/2011  Findings: There is a slightly comminuted subcapital fracture through the left femoral neck, with mild associated impaction.  The left femoral head remains seated at the acetabulum.  No additional fractures are identified.  An associated small left hip joint effusion is seen.  The left superior and inferior pubic rami remain intact.  The left acetabulum is within normal limits.  A 2.0 cm sclerotic focus within the left ilium  adjacent to the sacroiliac joint likely reflects a large bone island, as does a 1.4 cm sclerotic focus at the right sacrum.  The left sacroiliac joint is unremarkable in appearance.  Mild scattered vascular calcifications are seen.  The visualized small and large bowel loops are grossly unremarkable.  The prostate appears mildly enlarged, though not well assessed.  No significant abnormalities are seen with respect to the visualized musculature.  IMPRESSION:  1.  Slightly comminuted subcapital fracture through the left femoral neck, with mild associated impaction.  No evidence of dislocation. 2.  Associated small left hip joint effusion seen. 3.  Sclerotic foci within the left ilium and right sacrum, measuring up to 2.0 cm, likely reflect bone islands. 4.  Mild scattered vascular calcifications seen. 5.  Mildly enlarged prostate.  Original Report Authenticated By: Tonia Ghent, M.D.    Review of Systems  Unable to perform ROS  Blood pressure 125/82, pulse 82, temperature 98.2 F (36.8 C),  temperature source Oral, resp. rate 24, height 5\' 10"  (1.778 m), weight 70.7 kg (155 lb 13.8 oz), SpO2 89.00%. Physical Exam  Constitutional: He appears well-developed.  HENT:  Head: Atraumatic.  Cardiovascular: Intact distal pulses.   Respiratory: Effort normal.  Musculoskeletal:       Left hip: He exhibits tenderness.  Neurological: He is alert.       He is very difficult to understand and does not seem to communicate effectively.  Skin: Skin is warm and dry.  Psychiatric: His mood appears anxious. His speech is slurred. He is agitated.    Assessment/Plan: 56 year old with impacted left femoral neck fracture. This is obviously fairly stable since he's been walking on it for the last week. I think the most appropriate treatment for him will be percutaneous screw fixation to prevent any displacement allowed to heal in this position. I will plan on taking him this afternoon for surgery. We will try and  determine who his legal guardian is prior to surgery.  Mable Paris 04/11/2011, 7:38 AM

## 2011-04-11 NOTE — Progress Notes (Signed)
Call to dr. Ave Filter to confirm pt to go back to [facility]102. He would like patient to go to [facility]102.

## 2011-04-11 NOTE — Anesthesia Procedure Notes (Signed)
Procedure Name: Intubation Date/Time: 04/11/2011 9:08 PM Performed by: Ellin Goodie Pre-anesthesia Checklist: Patient identified, Emergency Drugs available, Suction available, Patient being monitored and Timeout performed Patient Re-evaluated:Patient Re-evaluated prior to inductionOxygen Delivery Method: Circle system utilized Preoxygenation: Pre-oxygenation with 100% oxygen Intubation Type: IV induction Ventilation: Mask ventilation without difficulty Laryngoscope Size: Mac and 3 Grade View: Grade I Tube type: Oral Number of attempts: 1 Airway Equipment and Method: Stylet Secured at: 21 cm Tube secured with: Tape Dental Injury: Teeth and Oropharynx as per pre-operative assessment

## 2011-04-11 NOTE — Preoperative (Signed)
Beta Blockers   Reason not to administer Beta Blockers:Not Applicable 

## 2011-04-11 NOTE — H&P (Addendum)
PCP:   Henri Medal, MD, MD   Chief Complaint:  Left hip pain  HPI: This is a 56 year old male with known history of seizures, schizoaffective disorder, organic mood disorder, intermittent explosive disorder, seizure disorder and mild mental retardation.he states he was coming from the bathroom when he stumbled and fell last Wednesday approximately 6 days ago. He states he's been walking since but very badly. He's had persistent and worsening pain in the left hip. For this reason he finally came to the ER today. Patient states he does not use a walker and he seldom falls. History provider the patient is alert and oriented.  I did attempt to call the patient's caregiver Nonnie Done at [phone],no unanswered her for a message was left.  Review of Systems:positives bolded  The patient denies anorexia, fever, weight loss,, vision loss, decreased hearing, hoarseness, chest pain, syncope, dyspnea on exertion, peripheral edema, balance deficits, hemoptysis, abdominal pain, melena, hematochezia, severe indigestion/heartburn, hematuria, incontinence, genital sores, muscle weakness, suspicious skin lesions, transient blindness, difficulty walking, depression, unusual weight change, abnormal bleeding, enlarged lymph nodes, angioedema, and breast masses.  Past Medical History: Past Medical History  Diagnosis Date  . Seizures   . Psychiatric disorder   . Schizoaffective disorder   . Organic mood disorder   . Intermittent explosive disorder   . Generalized anxiety disorder   . Mild mental retardation   . COPD (chronic obstructive pulmonary disease)   . Hypertension   . Peptic ulcer   . Thyroid disease   . Prostate hyperplasia with urinary obstruction   . Blind left eye   . Chronic respiratory failure    History reviewed. No pertinent past surgical history.  Medications: Prior to Admission medications   Medication Sig Start Date End Date Taking? Authorizing Provider    albuterol-ipratropium (COMBIVENT) 18-103 MCG/ACT inhaler Inhale 2 puffs into the lungs every 6 (six) hours as needed. Shortness of breath.   Yes Historical Provider, MD  amLODipine (NORVASC) 10 MG tablet Take 10 mg by mouth daily.     Yes Historical Provider, MD  diphenhydrAMINE (BENADRYL) 50 MG capsule Take 50 mg by mouth at bedtime as needed. For insomnia   Yes Historical Provider, MD  docusate sodium (COLACE) 100 MG capsule Take 100 mg by mouth 2 (two) times daily.   Yes Historical Provider, MD  fluticasone (FLOVENT HFA) 44 MCG/ACT inhaler Inhale 2 puffs into the lungs 2 (two) times daily. 02/05/11 02/05/12 Yes Vassie Loll, MD  levothyroxine (SYNTHROID, LEVOTHROID) 75 MCG tablet Take 75 mcg by mouth daily.     Yes Historical Provider, MD  lisinopril (PRINIVIL,ZESTRIL) 5 MG tablet Take 5 mg by mouth daily.     Yes Historical Provider, MD  lithium carbonate 300 MG capsule Take 300 mg by mouth 3 (three) times daily with meals.   Yes Historical Provider, MD  omeprazole (PRILOSEC) 20 MG capsule Take 20 mg by mouth 2 (two) times daily.     Yes Historical Provider, MD  potassium chloride (KLOR-CON) 20 MEQ packet Take 20 mEq by mouth daily.     Yes Historical Provider, MD  risperiDONE (RISPERDAL) 1 MG tablet Take 1 mg by mouth daily.   Yes Historical Provider, MD  risperiDONE (RISPERDAL) 2 MG tablet Take 2 mg by mouth daily at 8 pm.    Yes Historical Provider, MD  Tamsulosin HCl (FLOMAX) 0.4 MG CAPS Take by mouth.   Yes Historical Provider, MD  traZODone (DESYREL) 100 MG tablet Take 100 mg by mouth at bedtime.  Yes Historical Provider, MD  risperiDONE (RISPERDAL) 1 MG tablet Take 1 mg by mouth every morning.  02/13/11 03/15/11  Kathlen Mody, MD    Allergies:   Allergies  Allergen Reactions  . Peanut-Containing Drug Products Anaphylaxis  . Coffee Bean Extract     Per group home owner requested coffee be listed as an allergy due to volatile behavior when the pt takes it  . Prolixin (Fluphenazine  Hcl) Other (See Comments)    "muscle cramps"  . Thorazine (Chlorpromazine Hcl) Other (See Comments)    "burns"  . Lactose Intolerance (Gi) Rash    Social History:  reports that he has been smoking Cigarettes.  He has been smoking about .25 packs per day. He has never used smokeless tobacco. He reports that he does not drink alcohol or use illicit drugs.patient states he lives at home with family. He doesn't use a cane or walker. He states he is on home oxygen.  Family History: Family History  Problem Relation Age of Onset  . Hypertension      Physical Exam: Filed Vitals:   04/10/11 2251  BP: 130/87  Pulse: 99  Temp: 98.2 F (36.8 C)  TempSrc: Oral  Resp: 20  SpO2: 95%    General:  Alert and oriented times three, well developed and nourished, no acute distress Eyes: PERRLA, pink conjunctiva, no scleral icterus ENT: Moist oral mucosa, neck supple, no thyromegaly Lungs: clear to ascultation, mild wheezing throughout, no crackles, no use of accessory muscles Cardiovascular: regular rate and rhythm, no regurgitation, no gallops, no murmurs. No carotid bruits, no JVD Abdomen: soft, positive BS, non-tender, non-distended, no organomegaly, not an acute abdomen GU: not examined Neuro: CN II - XII grossly intact, sensation intact Musculoskeletal: strength 5/5 all extremities except left lower extremity which was not assessed, no clubbing, cyanosis or edema Skin: no rash, no subcutaneous crepitation, no decubitus    Labs on Admission:   [facility] 04/11/11 0029  NA 136  K 3.8  CL 102  CO2 25  GLUCOSE 96  BUN 9  CREATININE 0.78  CALCIUM 9.5  MG --  PHOS --    Basename 04/11/11 0029  AST 10  ALT 10  ALKPHOS 64  BILITOT 0.4  PROT 6.7  ALBUMIN 3.4*   No results found for this basename: LIPASE:2,AMYLASE:2 in the last 72 hours  Basename 04/11/11 0029  WBC 8.8  NEUTROABS 5.9  HGB 11.6*  HCT 36.2*  MCV 82.6  PLT 164   No results found for this basename:  CKTOTAL:3,CKMB:3,CKMBINDEX:3,TROPONINI:3 in the last 72 hours No components found with this basename: POCBNP:3 No results found for this basename: DDIMER:2 in the last 72 hours No results found for this basename: HGBA1C:2 in the last 72 hours No results found for this basename: CHOL:2,HDL:2,LDLCALC:2,TRIG:2,CHOLHDL:2,LDLDIRECT:2 in the last 72 hours No results found for this basename: TSH,T4TOTAL,FREET3,T3FREE,THYROIDAB in the last 72 hours No results found for this basename: VITAMINB12:2,FOLATE:2,FERRITIN:2,TIBC:2,IRON:2,RETICCTPCT:2 in the last 72 hours  Micro Results: No results found for this or any previous visit (from the past 240 hour(s)). Results for AMON, COSTILLA (MRN [id number]) as of 04/11/2011 02:48  Ref. Range 04/11/2011 00:42  Color, Urine Latest Range: YELLOW  YELLOW  APPearance Latest Range: CLEAR  CLEAR  Specific Gravity, Urine Latest Range: 1.005-1.030  1.005  pH Latest Range: 5.0-8.0  7.0  Glucose, UA Latest Range: NEGATIVE mg/dL NEGATIVE  Bilirubin Urine Latest Range: NEGATIVE  NEGATIVE  Ketones, ur Latest Range: NEGATIVE mg/dL NEGATIVE  Protein Latest Range: NEGATIVE mg/dL  NEGATIVE  Urobilinogen, UA Latest Range: 0.0-1.0 mg/dL 0.2  Nitrite Latest Range: NEGATIVE  NEGATIVE  Leukocytes, UA Latest Range: NEGATIVE  NEGATIVE  Hgb urine dipstick Latest Range: NEGATIVE  NEGATIVE    Radiological Exams on Admission: Dg Chest 2 View  04/11/2011  *RADIOLOGY REPORT*  Clinical Data: Left-sided chest pain and cough.  CHEST - 2 VIEW  Comparison: Chest radiograph and CTA of the chest performed 03/18/2011  Findings: The lungs are hyperexpanded, with flattening of the hemidiaphragms, compatible with COPD.  Chronic peribronchial thickening is noted.  The distribution of scarring and atelectasis is grossly unchanged.  There is no evidence of focal opacification, pleural effusion or pneumothorax.  The heart is normal in size; the mediastinal contour is within normal limits. The known  enlarged right hilar node is not well characterized on radiograph.  No acute osseous abnormalities are seen.  IMPRESSION:  1.  No acute cardiopulmonary process seen. 2.  Findings of COPD; chronic lung changes seen.  Original Report Authenticated By: Tonia Ghent, M.D.   Dg Hip Complete Left  04/10/2011  *RADIOLOGY REPORT*  Clinical Data: Fall and left groin pain.  LEFT HIP - COMPLETE 2+ VIEW  Comparison: None.  Findings: AP view of the pelvis and two views of the left hip were obtained.  There appears to be foreshortening of the left femoral neck and concern for a subcapital fracture.  There is sclerosis and irregularity of the left femoral neck.  Left hip is located.  The pelvic bony ring is intact.  No gross abnormality to the right hip. Large amount of stool in the right abdomen and pelvis.  IMPRESSION: Concern for a left femoral neck subcapital fracture. This finding could be confirmed with CT.  Original Report Authenticated By: Richarda Overlie, M.D.   Ct Hip Left Wo Contrast  04/11/2011  *RADIOLOGY REPORT*  Clinical Data: Status post fall; evaluate left hip fracture.  CT OF THE LEFT HIP WITHOUT CONTRAST  Technique:  Multidetector CT imaging was performed according to the standard protocol. Multiplanar CT image reconstructions were also generated.  Comparison: Left hip radiographs performed 04/10/2011  Findings: There is a slightly comminuted subcapital fracture through the left femoral neck, with mild associated impaction.  The left femoral head remains seated at the acetabulum.  No additional fractures are identified.  An associated small left hip joint effusion is seen.  The left superior and inferior pubic rami remain intact.  The left acetabulum is within normal limits.  A 2.0 cm sclerotic focus within the left ilium adjacent to the sacroiliac joint likely reflects a large bone island, as does a 1.4 cm sclerotic focus at the right sacrum.  The left sacroiliac joint is unremarkable in appearance.  Mild  scattered vascular calcifications are seen.  The visualized small and large bowel loops are grossly unremarkable.  The prostate appears mildly enlarged, though not well assessed.  No significant abnormalities are seen with respect to the visualized musculature.  IMPRESSION:  1.  Slightly comminuted subcapital fracture through the left femoral neck, with mild associated impaction.  No evidence of dislocation. 2.  Associated small left hip joint effusion seen. 3.  Sclerotic foci within the left ilium and right sacrum, measuring up to 2.0 cm, likely reflect bone islands. 4.  Mild scattered vascular calcifications seen. 5.  Mildly enlarged prostate.  Original Report Authenticated By: Tonia Ghent, M.D.    Assessment/Plan Present on Admission:  .Hip fracture, left Admit to MedSurg Orthopedics has been consulted by the ER physician Pain  medications when necessary We'll keep patient n.p.o. For now, IV fluid hydration ordered .COPD with acute exacerbation Chronic respiratory failure Tobacco abuse Patient with some wheezing, Solu-Medrol IV and nebulizers when necessary ordered Nicotine patch ordered and oxygen to keep sats greater than 88%  .HTN (hypertension) .Hypothyroidism .Mental retardation, mild (I.Q. 50-70) .Schizoaffective disorder Intermittent explosive disorder Generalized anxiety disorder .Seizure-like activity .BPH (benign prostatic hyperplasia) Stable resume home medications  DVT prophylaxis Team 7/Dr. Donzetta Sprung, [place] 04/11/2011, 2:55 AM

## 2011-04-11 NOTE — Anesthesia Postprocedure Evaluation (Signed)
Anesthesia Post Note  Patient: [name]  Procedure(s) Performed: Procedure(s) (LRB): CANNULATED HIP PINNING (Left)  Anesthesia type: general  Patient location: PACU  Post pain: Pain level controlled  Post assessment: Patient's Cardiovascular Status Stable  Last Vitals:  Filed Vitals:   04/11/11 2245  BP: 130/83  Pulse:   Temp:   Resp:     Post vital signs: Reviewed and stable  Level of consciousness: sedated  Complications: No apparent anesthesia complications

## 2011-04-11 NOTE — ED Notes (Signed)
Attempted to call report x 1, nurse in contact room.  Gave name and number and nurse will call back.

## 2011-04-11 NOTE — Progress Notes (Signed)
Called by nursing, patient appeared to have had a brief seizure episodes which which is of resolved. No medications a seizure this is a home meds. I did attempt to call the patient's home health care provider without success. IV Keppra is been ordered and seizure precautions. With for other causes that could lower the patient's seizure threshold such as a UTI

## 2011-04-11 NOTE — Transfer of Care (Signed)
Immediate Anesthesia Transfer of Care Note  Patient: [name]  Procedure(s) Performed: Procedure(s) (LRB): CANNULATED HIP PINNING (Left)  Patient Location: PACU  Anesthesia Type: General  Level of Consciousness: sedated  Airway & Oxygen Therapy: Patient Spontanous Breathing  Post-op Assessment: Report given to PACU RN  Post vital signs: stable  Complications: No apparent anesthesia complications

## 2011-04-11 NOTE — ED Notes (Signed)
Dr Pickering at bedside 

## 2011-04-11 NOTE — Progress Notes (Signed)
Pt with 2 episodes of pseudoseizures. Pt notified sitter that he felt like he was going to have a seizure, then began slow extremity jerking, when arms/hands held above head and dropped, pt's arms fell to side not striking head. Once movements stopped pt alert and oriented stating he just has a seizure. Second pseudoseizure began and ativan given. Dr Sharon Seller made aware, no new orders given. Will continue to monitor. [place], [place] M

## 2011-04-11 NOTE — Progress Notes (Addendum)
[facility] [place] TEAM 1 - Stepdown/ICU TEAM  Subjective: 56 year old male who resides full time in a psychiatric facility with known history of seizures, schizoaffective disorder, organic mood disorder, intermittent explosive disorder, seizure disorder and mild mental retardation.  He stated he was coming from the bathroom when he stumbled and fell, approximately 6 days prior to his admission. He states he's been walking since but with persistent and worsening pain in the left hip. For this reason he finally came to the ER 04/11/2011.  F/U visit completed  Objective: Weight change:   Intake/Output Summary (Last 24 hours) at 04/11/11 1113 Last data filed at 04/11/11 0900  Gross per 24 hour  Intake    400 ml  Output   2940 ml  Net  -2540 ml   Blood pressure 152/96, pulse 87, temperature 97.6 F (36.4 C), temperature source Axillary, resp. rate 25, height 5\' 10"  (1.778 m), weight 70.7 kg (155 lb 13.8 oz), SpO2 92.00%.  Physical Exam: F/U exam completed  Lab Results:  [facility] 04/11/11 0709 04/11/11 0029  NA -- 136  K -- 3.8  CL -- 102  CO2 -- 25  GLUCOSE -- 96  BUN -- 9  CREATININE 0.72 0.78  CALCIUM -- 9.5  MG -- --  PHOS -- --    Basename 04/11/11 0029  AST 10  ALT 10  ALKPHOS 64  BILITOT 0.4  PROT 6.7  ALBUMIN 3.4*    Basename 04/11/11 0709 04/11/11 0029  WBC 7.6 8.8  NEUTROABS -- 5.9  HGB 11.1* 11.6*  HCT 34.7* 36.2*  MCV 82.8 82.6  PLT 163 164   Studies/Results: All recent x-ray/radiology reports have been reviewed in detail.   Medications: I have reviewed the patient's complete medication list.  Assessment/Plan:  Seizure Continue IV meds - no sign of recurrent seizure activity - should be able to transition back to po meds postop  impacted left femoral neck fracture Ortho following - to OR today - medically stable for surgery   Multiple psychiatric/mood disorders Resume outpt tx regimen once able to take po meds  Mental  handicap RN attempting to find POA today   HTN Resume po meds when able   PUD Is on daily prilosec as outpt  Hypothyroid Cont replacement tx  COPD/tobacco abuse  Mild exp wheeze on exam - cont maintenance nebs and add prn nebs - not severe enough to require systemic steroids at present  Lonia Blood, MD [facility] Office  [phone] Pager [phone]  On-Call/Text Page:      [email or web address]      password [facility]

## 2011-04-11 NOTE — Op Note (Signed)
04/10/2011 - 04/11/2011  9:45 PM  PATIENT:  [name]    PRE-OPERATIVE DIAGNOSIS:  Left Femoral Neck Fx  POST-OPERATIVE DIAGNOSIS:  Same  PROCEDURE:  CANNULATED HIP PINNING  SURGEON:  Mable Paris, MD  PHYSICIAN ASSISTANT: none  ANESTHESIA:   General  PREOPERATIVE INDICATIONS:  AVANTAE BITHER is a  56 y.o. male who fell and was found to have a diagnosis of Left Femoral Neck Fx who was indicated for surgical management.    The risks benefits and alternatives were discussed with the patient preoperatively including but not limited to the risks of infection, bleeding, nerve injury, cardiopulmonary complications, blood clots, malunion, nonunion, avascular necrosis, the need for revision surgery, the potential for conversion to hemiarthroplasty, among others, and the patient was willing to proceed.  OPERATIVE IMPLANTS: 7.3 mm cannulated screws x3  OPERATIVE FINDINGS: Clinical osteoporosis with weak bone, proximal femur  OPERATIVE PROCEDURE: The patient was brought to the operating room and placed in supine position. IV antibiotics were given. General anesthesia administered. Foley was also given. She was placed on the fracture table. The left lower extremity was positioned, without any significant reduction maneuver. The left lower extremity was prepped and draped in usual sterile fashion.  Time out was performed.  Small incision was made distal to the greater trochanter, and 3 guidewires were introduced Into an inverted triangle configuration. The lengths were measured. The reduction was slightly valgus, and near-anatomic. I opened the cortex with a cannulated drill, and then placed the screws into position. Satisfactory fixation was achieved.  The wounds were irrigated copiously, and repaired with Vicryl, staples  and sterile gauze. There no complications and the patient tolerated the procedure well.  The patient will be weightbearing as tolerated, and will be on Coumadin  for a period of one month postoperatively.

## 2011-04-11 NOTE — ED Notes (Signed)
Lab at bedside; x-ray at bedside to transport patient to x-ray.

## 2011-04-12 ENCOUNTER — Encounter (HOSPITAL_COMMUNITY): Payer: Self-pay

## 2011-04-12 LAB — POCT I-STAT, CHEM 8
Creatinine, Ser: 1.1 mg/dL (ref 0.50–1.35)
Hemoglobin: 13.9 g/dL (ref 13.0–17.0)
Potassium: 3.8 mEq/L (ref 3.5–5.1)
Sodium: 140 mEq/L (ref 135–145)
TCO2: 25 mmol/L (ref 0–100)

## 2011-04-12 LAB — BASIC METABOLIC PANEL
BUN: 8 mg/dL (ref 6–23)
CO2: 23 mEq/L (ref 19–32)
CO2: 24 mEq/L (ref 19–32)
Calcium: 9.2 mg/dL (ref 8.4–10.5)
Chloride: 105 mEq/L (ref 96–112)
Chloride: 112 mEq/L (ref 96–112)
Creatinine, Ser: 0.7 mg/dL (ref 0.50–1.35)
GFR calc Af Amer: >90 mL/min (ref >90)
Sodium: 139 mEq/L (ref 135–145)

## 2011-04-12 LAB — PROTIME-INR: Prothrombin Time: 13.7 seconds (ref 11.6–15.2)

## 2011-04-12 LAB — CBC
HCT: 35.6 % — ABNORMAL LOW (ref 39.0–52.0)
MCH: 26.8 pg (ref 26.0–34.0)
MCHC: 32 g/dL (ref 30.0–36.0)
MCV: 83.8 fL (ref 78.0–100.0)
Platelets: 166 10*3/uL (ref 150–400)
RBC: 4.55 MIL/uL (ref 4.22–5.81)
RDW: 15.1 % (ref 11.5–15.5)
RDW: 15.3 % (ref 11.5–15.5)
WBC: 6.6 10*3/uL (ref 4.0–10.5)

## 2011-04-12 MED ORDER — WARFARIN VIDEO: Freq: Once | Status: DC

## 2011-04-12 MED ORDER — WARFARIN SODIUM 5 MG PO TABS
5.0000 mg | ORAL_TABLET | Freq: Once | ORAL | Status: AC
  Administered 2011-04-12: 5 mg via ORAL
  Filled 2011-04-12: qty 1

## 2011-04-12 MED ORDER — WARFARIN - PHARMACIST DOSING INPATIENT: Freq: Every day | Status: DC

## 2011-04-12 MED ORDER — IPRATROPIUM-ALBUTEROL 18-103 MCG/ACT IN AERO
2.0000 | INHALATION_SPRAY | Freq: Four times a day (QID) | RESPIRATORY_TRACT | Status: DC | PRN
  Filled 2011-04-12: qty 14.7

## 2011-04-12 MED ORDER — LEVETIRACETAM 500 MG PO TABS
500.0000 mg | ORAL_TABLET | Freq: Two times a day (BID) | ORAL | Status: DC
  Administered 2011-04-12 – 2011-04-16 (×9): 500 mg via ORAL
  Filled 2011-04-12 (×11): qty 1

## 2011-04-12 MED ORDER — IPRATROPIUM BROMIDE 0.02 % IN SOLN
0.5000 mg | Freq: Three times a day (TID) | RESPIRATORY_TRACT | Status: DC
  Administered 2011-04-12 – 2011-04-18 (×16): 0.5 mg via RESPIRATORY_TRACT
  Filled 2011-04-12 (×19): qty 2.5

## 2011-04-12 MED ORDER — COUMADIN BOOK
Freq: Once | Status: AC
  Administered 2011-04-12: 15:00:00
  Filled 2011-04-12: qty 1

## 2011-04-12 MED ORDER — ALBUTEROL SULFATE (5 MG/ML) 0.5% IN NEBU
2.5000 mg | INHALATION_SOLUTION | Freq: Three times a day (TID) | RESPIRATORY_TRACT | Status: DC
  Administered 2011-04-12 – 2011-04-18 (×16): 2.5 mg via RESPIRATORY_TRACT
  Filled 2011-04-12 (×19): qty 0.5

## 2011-04-12 MED ORDER — LORAZEPAM 2 MG/ML IJ SOLN
1.0000 mg | INTRAMUSCULAR | Status: DC | PRN
  Filled 2011-04-12 (×2): qty 1

## 2011-04-12 NOTE — Progress Notes (Signed)
PATIENT ID: [name]  MRN: [id number]  DOB/AGE:  57-Jun-1957 / 57 y.o.  1 Day Post-Op Procedure(s) (LRB): CANNULATED HIP PINNING (Left)  Subjective: Pain is mild.  No c/o chest pain or SOB.     Objective: Vital signs in last 24 hours: Temp:  [97.6 F (36.4 C)-98.5 F (36.9 C)] 97.7 F (36.5 C) (04/11 0400) Pulse Rate:  [82-125] 97  (04/11 0600) Resp:  [21-33] 22  (04/11 0600) BP: (121-163)/(76-113) 143/86 mmHg (04/11 0600) SpO2:  [88 %-100 %] 93 % (04/11 0600)  Intake/Output from previous day: 04/10 0701 - 04/11 0700 In: 2657.5 [I.V.:2552.5; IV Piggyback:105] Out: 4340 [Urine:4340] Intake/Output this shift: Total I/O In: 1600 [I.V.:1600] Out: 1200 [Urine:1200]   Basename 04/12/11 0350 04/11/11 2355 04/11/11 0709 04/11/11 0029  HGB 11.9* 11.4* 11.1* 11.6*    Basename 04/12/11 0350 04/11/11 2355  WBC 6.6 6.9  RBC 4.55 4.25  HCT 37.6* 35.6*  PLT 166 173    Basename 04/12/11 0350 04/11/11 2355  NA 139 146*  K 4.0 3.8  CL 105 112  CO2 23 24  BUN 8 8  CREATININE 0.69 0.70  GLUCOSE 112* 135*  CALCIUM 9.2 9.2    Basename 04/12/11 0350 04/11/11 2355  LABPT -- --  INR 1.03 1.07    Physical Exam: Sensation intact distally Dorsiflexion/Plantar flexion intact Incision: dressing C/D/I  Assessment/Plan: 1 Day Post-Op Procedure(s) (LRB): CANNULATED HIP PINNING (Left)   Up with therapy Weight Bearing as Tolerated (WBAT)  VTE prophylaxis: pharmacologic prophylaxis (with any of the following: warfarin adjusted-dose) goal INR 1.5-2.5 x 30 days.   Mable Paris 04/12/2011, 6:58 AM

## 2011-04-12 NOTE — Progress Notes (Signed)
[facility] [place] TEAM 1 - Stepdown/ICU TEAM  Subjective: 56 year old male who resides full time in a psychiatric facility with known history of seizures (and pseudoseizures), schizoaffective disorder and mild mental retardation.   He states he was coming from the bathroom when he stumbled and fell, approximately 6 days prior to his admission. He states he's been walking since but with persistent and worsening pain in the left hip. For this reason he finally came to the ER 04/11/2011.  He has no complaints. Per RN, he has been pleasant today.   Objective: Weight change:   Intake/Output Summary (Last 24 hours) at 04/12/11 1533 Last data filed at 04/12/11 1500  Gross per 24 hour  Intake   3640 ml  Output   3920 ml  Net   -280 ml   Blood pressure 142/81, pulse 98, temperature 98.7 F (37.1 C), temperature source Axillary, resp. rate 31, height 5\' 10"  (1.778 m), weight 70.7 kg (155 lb 13.8 oz), SpO2 99.00%.  Physical Exam: General: Alert and oriented times three, well developed and nourished, no acute distress  Lungs: clear to ascultation,  Cardiovascular: regular rate and rhythm, no regurgitation, no gallops, no murmurs. = Abdomen: soft, positive BS, non-tender, non-distended, no organomegaly Neuro: CN II - XII grossly intact, sensation intact  EXT: no clubbing, cyanosis or edema   Lab Results:  Basename 04/12/11 0350 04/11/11 2355 04/11/11 0709 04/11/11 0029  NA 139 146* -- 136  K 4.0 3.8 -- 3.8  CL 105 112 -- 102  CO2 23 24 -- 25  GLUCOSE 112* 135* -- 96  BUN 8 8 -- 9  CREATININE 0.69 0.70 0.72 --  CALCIUM 9.2 9.2 -- 9.5  MG -- -- -- --  PHOS -- -- -- --    Basename 04/11/11 0029  AST 10  ALT 10  ALKPHOS 64  BILITOT 0.4  PROT 6.7  ALBUMIN 3.4*    Basename 04/12/11 0350 04/11/11 2355 04/11/11 0709 04/11/11 0029  WBC 6.6 6.9 7.6 --  NEUTROABS -- -- -- 5.9  HGB 11.9* 11.4* 11.1* --  HCT 37.6* 35.6* 34.7* --  MCV 82.6 83.8 82.8 --  PLT 166 173 163 --    Studies/Results: All recent x-ray/radiology reports have been reviewed in detail.   Medications: I have reviewed the patient's complete medication list.  Assessment/Plan:  Seizure Change Keppra to PO Having pseudo- seizures now- was doing this prior to leaving the hospital   impacted left femoral neck fracture S/p ORIF on 4/10  Multiple psychiatric/mood disorders Stable on PO meds  Mental handicap  HTN BP stable on norvasc and lisinopril.  PUD Is on daily prilosec as outpt On protonix here  Hypothyroid Cont replacement tx  COPD/tobacco abuse  Stable today  Calvert Cantor, MD [facility] Office  [phone] Pager [phone]  On-Call/Text Page:      [email or web address]      password [facility]

## 2011-04-12 NOTE — Progress Notes (Signed)
ANTICOAGULATION CONSULT NOTE - Follow Up Consult  Pharmacy Consult for Coumadin Indication: VTE prophylaxis s/p hip surgery  Allergies  Allergen Reactions  . Peanut-Containing Drug Products Anaphylaxis  . Coffee Bean Extract     Per group home owner requested coffee be listed as an allergy due to volatile behavior when the pt takes it  . Prolixin (Fluphenazine Hcl) Other (See Comments)    "muscle cramps"  . Thorazine (Chlorpromazine Hcl) Other (See Comments)    "burns"  . Lactose Intolerance (Gi) Rash    Patient Measurements: Height: 5\' 10"  (177.8 cm) Weight: 155 lb 13.8 oz (70.7 kg) IBW/kg (Calculated) : 73   Vital Signs: Temp: 99.1 F (37.3 C) (04/11 0743) Temp src: Axillary (04/11 0743) BP: 142/94 mmHg (04/11 0800) Pulse Rate: 103  (04/11 0800)  Labs:  Basename 04/12/11 0350 04/11/11 2355 04/11/11 0709  HGB 11.9* 11.4* --  HCT 37.6* 35.6* 34.7*  PLT 166 173 163  APTT -- -- --  LABPROT 13.7 14.1 --  INR 1.03 1.07 --  HEPARINUNFRC -- -- --  CREATININE 0.69 0.70 0.72  CKTOTAL -- -- --  CKMB -- -- --  TROPONINI -- -- --   Estimated Creatinine Clearance: 101.9 ml/min (by C-G formula based on Cr of 0.69).   Assessment: 56 yo M s/p hip surgery to have Coumadin for VTE prophylaxis x 30 days with lower goal INR.  First dose of Coumadin given at midnight and INR remains low which is expected.  No overt bleeding noted.  Goal of Therapy:  INR 1.5-2.5   Plan:  1.  Repeat Coumadin 5 mg po x 1 tonight 2.  F/U AM INR 3.  Coumadin education book/video ordered  Rolland Porter, Pharm.D., BCPS Clinical Pharmacist Pager: [phone]

## 2011-04-12 NOTE — Evaluation (Signed)
Agree with student PT evaluation note.  Keyondre Hepburn, PT DPT [phone]  

## 2011-04-12 NOTE — Progress Notes (Signed)
Occupational Therapy Evaluation Patient Details Name: [name] MRN: [id number] DOB: 23-Jun-1955 Today's Date: 04/12/2011  Problem List:  Patient Active Problem List  Diagnoses  . COPD with acute exacerbation  . Chest pain  . Schizoaffective disorder  . Mental retardation, mild (I.Q. 50-70)  . Hypothyroidism  . BPH (benign prostatic hyperplasia)  . Seizure-like activity  . HTN (hypertension)  . Tobacco use  . Chest pain on breathing  . Hip fracture, left    Past Medical History:  Past Medical History  Diagnosis Date  . Seizures   . Psychiatric disorder   . Schizoaffective disorder   . Organic mood disorder   . Intermittent explosive disorder   . Generalized anxiety disorder   . Mild mental retardation   . COPD (chronic obstructive pulmonary disease)   . Hypertension   . Peptic ulcer   . Thyroid disease   . Prostate hyperplasia with urinary obstruction   . Blind left eye   . Chronic respiratory failure    Past Surgical History: History reviewed. No pertinent past surgical history.  OT Assessment/Plan/Recommendation OT Assessment Clinical Impression Statement: Pt admitted with L hip fx due to  fall at group home and is s/p cannulated Left hip pinning.  Pt with history of mental retardation and mental disorders.  Will benefit fom acute OT services to address below problem list in prep for d/c to SNF. OT Recommendation/Assessment: Patient will need skilled OT in the acute care venue OT Problem List: Decreased activity tolerance;Decreased cognition;Decreased knowledge of use of DME or AE;Decreased strength Barriers to Discharge Comments: Unsure of pt's PLOF.  OT Therapy Diagnosis : Generalized weakness;Cognitive deficits OT Plan OT Frequency: Min 2X/week OT Treatment/Interventions: Self-care/ADL training;Therapeutic activities;Patient/family education;Cognitive remediation/compensation;DME and/or AE instruction OT Recommendation Follow Up Recommendations: Skilled  nursing facility Equipment Recommended: Defer to next venue Individuals Consulted Consulted and Agree with Results and Recommendations: Patient OT Goals Acute Rehab OT Goals OT Goal Formulation: With patient Time For Goal Achievement: 2 weeks ADL Goals Pt Will Perform Grooming: with supervision;Standing at sink ADL Goal: Grooming - Progress: Goal set today Pt Will Perform Lower Body Bathing: with supervision;Sit to stand from bed ADL Goal: Lower Body Bathing - Progress: Goal set today Pt Will Transfer to Toilet: with supervision;with DME;Ambulation;3-in-1 ADL Goal: Toilet Transfer - Progress: Goal set today  OT Evaluation Precautions/Restrictions  Restrictions Weight Bearing Restrictions: Yes LLE Weight Bearing: Weight bearing as tolerated Prior Functioning Home Living Available Help at Discharge: Other (Comment) (Group home) Additional Comments: Pt unable to provide home living information.  Per RN report and chart review, pt is resident at group home.   Prior Function Level of Independence:  (unable to determine level of Independence) Vocation: Other (comment) (per RN report, pt has a job at a department store) Comments: Pt unable to provide information regarding level of Independence PTA.  Will continue to attempt confirmation of pt's PLOF.   ADL Eating/Feeding: Performed;Modified independent Where Assessed - Eating/Feeding: Chair Grooming: Simulated;Set up Grooming Details (indicate cue type and reason): Setup assist to gather grooming items. Where Assessed - Grooming: Sitting, chair Lower Body Bathing: Simulated;Minimal assistance Lower Body Bathing Details (indicate cue type and reason): Min assist with LLE Where Assessed - Lower Body Bathing: Sitting, bed Lower Body Dressing: Simulated;Minimal assistance Lower Body Dressing Details (indicate cue type and reason): Min assist with LLE Where Assessed - Lower Body Dressing: Sitting, bed Toilet Transfer: Simulated;+2 Total  assistance;Comment for patient % (70%) Toilet Transfer Details (indicate cue type and reason):  Bed to chair. +2 assist for safety due to pt's impulsive behavior. Toilet Transfer Method: Surveyor, minerals: Other (comment) Nurse, children's) Equipment Used: Agricultural consultant Vision/Perception  Vision - History Baseline Vision: Other (comment) (blind Lt eye) Patient Visual Report: No change from baseline Cognition Cognition Arousal/Alertness: Awake/alert Overall Cognitive Status: Impaired Attention: Impaired Current Attention Level: Sustained Orientation Level: Appropriate for developmental age;Oriented to person;Oriented to place;Oriented to situation Following Commands: Follows one step commands consistently;Follows multi-step commands inconsistently Safety/Judgement: Decreased safety judgement for tasks assessed Decreased Safety/Judgement: Impulsive Cognition - Other Comments: Pt appears to be near baseline level of function with history of mental retardation and other mental disorders. Sensation/Coordination Coordination Gross Motor Movements are Fluid and Coordinated: Yes (bil. UE) Fine Motor Movements are Fluid and Coordinated: Yes (bil. UE) Extremity Assessment RUE Assessment RUE Assessment: Within Functional Limits LUE Assessment LUE Assessment: Within Functional Limits Mobility  Bed Mobility Bed Mobility: Yes Supine to Sit: 5: Supervision;HOB elevated (Comment degrees) (HOB 40 degrees) Supine to Sit Details (indicate cue type and reason): Pt  used bil. UE to assist with moving LLE to EOB. Sitting - Scoot to Edge of Bed: Other (comment) (min guard assist) Sitting - Scoot to Edge of Bed Details (indicate cue type and reason): Min guard assist for safety scooting EOB due to pt's impulsive behavior. Transfers Transfers: Yes Sit to Stand: 1: +2 Total assist;Patient percentage (comment);From bed;With upper extremity assist (70%) Sit to Stand Details (indicate cue type  and reason): +2 assist for safety due to pt's impulsive behavior. Pt not putting full weight through LLE. Stand to Sit: 3: Mod assist;To chair/3-in-1;With upper extremity assist;With armrests Stand to Sit Details: Assist to control descent and for safe hand placement. Exercises   End of Session OT - End of Session Equipment Utilized During Treatment: Gait belt Activity Tolerance: Patient tolerated treatment well Patient left: in chair;with call bell in reach Nurse Communication: Mobility status for transfers;Mobility status for ambulation General Behavior During Session: [facility] for tasks performed (pt intermittently laughing throughout session) Cognition: Impaired, at baseline  11:53 AM  04/12/2011 Cipriano Mile OTR/L Pager [phone] Office [phone]

## 2011-04-12 NOTE — Progress Notes (Signed)
ANTICOAGULATION CONSULT NOTE - Initial Consult  Pharmacy Consult for Coumadin Indication: VTE prophylaxis s/p hip surgery  Allergies  Allergen Reactions  . Peanut-Containing Drug Products Anaphylaxis  . Coffee Bean Extract     Per group home owner requested coffee be listed as an allergy due to volatile behavior when the pt takes it  . Prolixin (Fluphenazine Hcl) Other (See Comments)    "muscle cramps"  . Thorazine (Chlorpromazine Hcl) Other (See Comments)    "burns"  . Lactose Intolerance (Gi) Rash    Patient Measurements: Height: 5\' 10"  (177.8 cm) Weight: 155 lb 13.8 oz (70.7 kg) IBW/kg (Calculated) : 73   Vital Signs: Temp: 97.7 F (36.5 C) (04/10 2340) Temp src: Axillary (04/10 2340) BP: 134/87 mmHg (04/10 2340) Pulse Rate: 122  (04/10 2200)  Labs:  [facility] 04/11/11 0709 04/11/11 0029  HGB 11.1* 11.6*  HCT 34.7* 36.2*  PLT 163 164  APTT -- --  LABPROT -- --  INR -- --  HEPARINUNFRC -- --  CREATININE 0.72 0.78  CKTOTAL -- --  CKMB -- --  TROPONINI -- --   Estimated Creatinine Clearance: 101.9 ml/min (by C-G formula based on Cr of 0.72).  Medical History: Past Medical History  Diagnosis Date  . Seizures   . Psychiatric disorder   . Schizoaffective disorder   . Organic mood disorder   . Intermittent explosive disorder   . Generalized anxiety disorder   . Mild mental retardation   . COPD (chronic obstructive pulmonary disease)   . Hypertension   . Peptic ulcer   . Thyroid disease   . Prostate hyperplasia with urinary obstruction   . Blind left eye   . Chronic respiratory failure     Medications:  Prescriptions prior to admission  Medication Sig Dispense Refill  . albuterol-ipratropium (COMBIVENT) 18-103 MCG/ACT inhaler Inhale 2 puffs into the lungs every 6 (six) hours as needed. Shortness of breath.      Marland Kitchen amLODipine (NORVASC) 10 MG tablet Take 10 mg by mouth daily.        . diphenhydrAMINE (BENADRYL) 50 MG capsule Take 50 mg by mouth at bedtime  as needed. For insomnia      . docusate sodium (COLACE) 100 MG capsule Take 100 mg by mouth 2 (two) times daily.      . fluticasone (FLOVENT HFA) 44 MCG/ACT inhaler Inhale 2 puffs into the lungs 2 (two) times daily.      Marland Kitchen levothyroxine (SYNTHROID, LEVOTHROID) 75 MCG tablet Take 75 mcg by mouth daily.        Marland Kitchen lisinopril (PRINIVIL,ZESTRIL) 5 MG tablet Take 5 mg by mouth daily.        Marland Kitchen lithium carbonate 300 MG capsule Take 300 mg by mouth 3 (three) times daily with meals.      Marland Kitchen omeprazole (PRILOSEC) 20 MG capsule Take 20 mg by mouth 2 (two) times daily.        . potassium chloride (KLOR-CON) 20 MEQ packet Take 20 mEq by mouth daily.        . risperiDONE (RISPERDAL) 1 MG tablet Take 1 mg by mouth daily.      . risperiDONE (RISPERDAL) 2 MG tablet Take 2 mg by mouth daily at 8 pm.       . Tamsulosin HCl (FLOMAX) 0.4 MG CAPS Take by mouth.      . traZODone (DESYREL) 100 MG tablet Take 100 mg by mouth at bedtime.        . risperiDONE (RISPERDAL) 1 MG tablet  Take 1 mg by mouth every morning.         Assessment: 56 yo male with hip fracture s/p repair for Coumadin  Goal of Therapy:  INR 1.5-2.5   Plan:  Coumadin 5 mg tonight Follow-up daily INR  Lucilia Yanni, Gary Fleet 04/12/2011,12:14 AM

## 2011-04-12 NOTE — Evaluation (Signed)
Physical Therapy Evaluation Patient Details Name: [name] MRN: [id number] DOB: 1955/05/07 Today's Date: 04/12/2011  Problem List:  Patient Active Problem List  Diagnoses  . COPD with acute exacerbation  . Chest pain  . Schizoaffective disorder  . Mental retardation, mild (I.Q. 50-70)  . Hypothyroidism  . BPH (benign prostatic hyperplasia)  . Seizure-like activity  . HTN (hypertension)  . Tobacco use  . Chest pain on breathing  . Hip fracture, left    Past Medical History:  Past Medical History  Diagnosis Date  . Seizures   . Psychiatric disorder   . Schizoaffective disorder   . Organic mood disorder   . Intermittent explosive disorder   . Generalized anxiety disorder   . Mild mental retardation   . COPD (chronic obstructive pulmonary disease)   . Hypertension   . Peptic ulcer   . Thyroid disease   . Prostate hyperplasia with urinary obstruction   . Blind left eye   . Chronic respiratory failure    Past Surgical History: History reviewed. No pertinent past surgical history.  PT Assessment/Plan/Recommendation PT Assessment Clinical Impression Statement: Pt admitted s/p fall at group home with resulting L femoral neck fracture and subsequent cannulated Left hip pinning with resulting decreased mobility, impaired balance, and impaired gait.  Pt with history of mental retardation and mental disorders.  Will benefit from skilled physical therapy in the acute setting to address these impairement and prepare for d/c to SNF. PT Recommendation/Assessment: Patient will need skilled PT in the acute care venue PT Problem List: Decreased strength;Decreased activity tolerance;Decreased balance;Decreased mobility;Decreased knowledge of use of DME;Decreased safety awareness;Decreased knowledge of precautions;Decreased range of motion Barriers to Discharge: Other (comment) (Unable to assess pt PLOF.) PT Therapy Diagnosis : Difficulty walking;Abnormality of gait PT Plan PT  Frequency: Min 3X/week PT Treatment/Interventions: DME instruction;Gait training;Functional mobility training;Therapeutic activities;Balance training;Neuromuscular re-education;Patient/family education PT Recommendation Follow Up Recommendations: Skilled nursing facility;Supervision - Intermittent Equipment Recommended: Defer to next venue PT Goals  Acute Rehab PT Goals PT Goal Formulation: With patient Time For Goal Achievement: 7 days Pt will go Supine/Side to Sit: with modified independence;with rail PT Goal: Supine/Side to Sit - Progress: Goal set today Pt will go Sit to Supine/Side: with modified independence;with rail PT Goal: Sit to Supine/Side - Progress: Goal set today Pt will go Sit to Stand: Other (comment) (Min guard) PT Goal: Sit to Stand - Progress: Goal set today Pt will go Stand to Sit: with supervision PT Goal: Stand to Sit - Progress: Goal set today Pt will Transfer Bed to Chair/Chair to Bed: with supervision PT Transfer Goal: Bed to Chair/Chair to Bed - Progress: Goal set today Pt will Ambulate: 51 - 150 feet;Other (comment);with least restrictive assistive device (Min guard) PT Goal: Ambulate - Progress: Goal set today  PT Evaluation Precautions/Restrictions  Precautions Precautions: Fall Restrictions Weight Bearing Restrictions: Yes LLE Weight Bearing: Weight bearing as tolerated Prior Functioning  Home Living Available Help at Discharge: Other (Comment) (Group home) Additional Comments: Pt unable to provide home living information.  Per RN report and chart review, pt is resident at group home.   Prior Function Level of Independence:  (unable to determine level of Independence) Vocation: Other (comment) (per RN report, pt has a job at a department store) Comments:   Pt unable to provide information regarding level of Independence PTA. Pt was most likely independent with BADLs and functional transfers but will attempt to gather more information during future  sessions.  Cognition Cognition Arousal/Alertness: Awake/alert  Overall Cognitive Status: Impaired Attention: Impaired Current Attention Level: Sustained Orientation Level: Appropriate for developmental age;Oriented to person;Oriented to place;Oriented to situation Following Commands: Follows one step commands consistently;Follows multi-step commands inconsistently Safety/Judgement: Decreased safety judgement for tasks assessed Decreased Safety/Judgement: Impulsive Cognition - Other Comments: Pt appears to be near baseline level of function with history of mental retardation and other mental disorders. Sensation/Coordination Coordination Gross Motor Movements are Fluid and Coordinated: Yes Fine Motor Movements are Fluid and Coordinated: Yes Extremity Assessment RUE Assessment RUE Assessment: Within Functional Limits LUE Assessment LUE Assessment: Within Functional Limits RLE Assessment RLE Assessment: Within Functional Limits Mobility (including Balance) Bed Mobility Bed Mobility: Yes Supine to Sit: 5: Supervision;HOB elevated (Comment degrees) (HOB 40 degrees) Supine to Sit Details (indicate cue type and reason): Pt required supervision for safety.  Pt used both UE to assist LLE to EOB. Sitting - Scoot to Edge of Bed: Other (comment) (min guard assist) Sitting - Scoot to Edge of Bed Details (indicate cue type and reason): Pt required min guard assist for safety to scoot to EOB due to impulsiveness. Transfers Transfers: Yes Sit to Stand: 1: +2 Total assist;Patient percentage (comment);From bed;With upper extremity assist (70%) Sit to Stand Details (indicate cue type and reason): Pt required +2 assist for safety due to impulsiveness. Pt not putting full weight through LLE.. Stand to Sit: 3: Mod assist;To chair/3-in-1;With upper extremity assist;With armrests Stand to Sit Details: Pt required mod assist to control descent into chair and for safe hand  placement. Ambulation/Gait Ambulation/Gait: Yes Ambulation/Gait Assistance: 1: +2 Total assist;Patient percentage (comment) (pt = 60%) Ambulation/Gait Assistance Details (indicate cue type and reason): Pt required +2 assist to ambulate 5 feet from the bed to the chair due to impulsiveness and decreased safety awareness. Ambulation Distance (Feet): 5 Feet Assistive device: Rolling walker Gait Pattern: Shuffle Stairs: No Wheelchair Mobility Wheelchair Mobility: No  Posture/Postural Control Posture/Postural Control: No significant limitations Balance Balance Assessed: Yes Static Sitting Balance Static Sitting - Balance Support: Feet supported;Bilateral upper extremity supported Static Sitting - Level of Assistance: 5: Stand by assistance Static Sitting - Comment/# of Minutes: 5 minutes Exercise    End of Session PT - End of Session Equipment Utilized During Treatment: Gait belt Activity Tolerance: Patient tolerated treatment well Patient left: in chair;with call bell in reach Nurse Communication: Mobility status for transfers General Behavior During Session: [facility] for tasks performed (pt intermittently laughing throughout session) Cognition: Impaired, at baseline  [place], [place] SPT 04/12/2011, 1:00 PM

## 2011-04-13 LAB — CARDIAC PANEL(CRET KIN+CKTOT+MB+TROPI)
CK, MB: 1.1 ng/mL (ref 0.3–4.0)
Relative Index: INVALID (ref 0.0–2.5)
Total CK: 72 U/L (ref 7–232)
Troponin I: <0.3 ng/mL (ref <0.30)
Troponin I: <0.3 ng/mL (ref <0.30)

## 2011-04-13 LAB — BASIC METABOLIC PANEL
BUN: 9 mg/dL (ref 6–23)
Chloride: 107 mEq/L (ref 96–112)
GFR calc Af Amer: >90 mL/min (ref >90)
GFR calc non Af Amer: >90 mL/min (ref >90)
Potassium: 3.8 mEq/L (ref 3.5–5.1)
Sodium: 142 mEq/L (ref 135–145)

## 2011-04-13 LAB — PROTIME-INR
INR: 1.04 (ref 0.00–1.49)
Prothrombin Time: 13.8 seconds (ref 11.6–15.2)

## 2011-04-13 LAB — CBC
HCT: 34.8 % — ABNORMAL LOW (ref 39.0–52.0)
MCHC: 31.6 g/dL (ref 30.0–36.0)
Platelets: 184 10*3/uL (ref 150–400)
RDW: 15.3 % (ref 11.5–15.5)
WBC: 4.6 10*3/uL (ref 4.0–10.5)

## 2011-04-13 MED ORDER — WARFARIN SODIUM 5 MG PO TABS
5.0000 mg | ORAL_TABLET | Freq: Once | ORAL | Status: AC
  Administered 2011-04-13: 5 mg via ORAL
  Filled 2011-04-13: qty 1

## 2011-04-13 MED ORDER — FLUTICASONE PROPIONATE HFA 44 MCG/ACT IN AERO
2.0000 | INHALATION_SPRAY | Freq: Two times a day (BID) | RESPIRATORY_TRACT | Status: DC
  Administered 2011-04-13 – 2011-04-19 (×6): 2 via RESPIRATORY_TRACT
  Filled 2011-04-13: qty 10.6

## 2011-04-13 NOTE — Progress Notes (Signed)
04/13/2011 [name] SPARKS Case Management Note [phone]  Utilization review completed.  

## 2011-04-13 NOTE — Progress Notes (Signed)
[facility] [place] TEAM 1 - Stepdown/ICU TEAM  Subjective: 56 year old male who resides full time in a psychiatric facility with known history of seizures (and pseudoseizures), schizoaffective disorder and mild mental retardation.   He states he was coming from the bathroom when he stumbled and fell, approximately 6 days prior to his admission. He states he's been walking since but with persistent and worsening pain in the left hip. For this reason he finally came to the ER 04/11/2011.  Stating today that he wants to go to a nursing home rather than his apartment due to his leg. Also per RNs he loves coffee an dis on his 5th cup today. We need to remove "coffee bean extract" from his allergy list. He has not had and seizures or pseudoseizures per RNs.   Objective: Weight change:   Intake/Output Summary (Last 24 hours) at 04/13/11 1251 Last data filed at 04/13/11 1215  Gross per 24 hour  Intake   2860 ml  Output   3500 ml  Net   -640 ml   Blood pressure 127/72, pulse 87, temperature 99.3 F (37.4 C), temperature source Axillary, resp. rate 22, height 5\' 10"  (1.778 m), weight 70.7 kg (155 lb 13.8 oz), SpO2 99.00%.  Physical Exam: General: Alert, well developed and nourished, no acute distress  Lungs: clear to ascultation,  Cardiovascular: regular rate and rhythm, no regurgitation, no gallops, no murmurs.  Abdomen: soft, positive BS, non-tender, non-distended, no organomegaly Neuro: CN II - XII grossly intact, sensation intact  EXT: no clubbing, cyanosis or edema   Lab Results:  Basename 04/13/11 0350 04/12/11 0350 04/11/11 2355  NA 142 139 146*  K 3.8 4.0 3.8  CL 107 105 112  CO2 26 23 24   GLUCOSE 91 112* 135*  BUN 9 8 8   CREATININE 0.82 0.69 0.70  CALCIUM 9.0 9.2 9.2  MG -- -- --  PHOS -- -- --    Basename 04/11/11 0029  AST 10  ALT 10  ALKPHOS 64  BILITOT 0.4  PROT 6.7  ALBUMIN 3.4*    Basename 04/13/11 0350 04/12/11 0350 04/11/11 2355 04/11/11 0029    WBC 4.6 6.6 6.9 --  NEUTROABS -- -- -- 5.9  HGB 11.0* 11.9* 11.4* --  HCT 34.8* 37.6* 35.6* --  MCV 82.1 82.6 83.8 --  PLT 184 166 173 --   Studies/Results: All recent x-ray/radiology reports have been reviewed in detail.   Medications: I have reviewed the patient's complete medication list.  Assessment/Plan:  Seizure Changed Keppra to PO Having pseudo- seizures occasionally- he is known to do this in an attempt to manipulate staff. At times these are staring spell and at other times they are shaking or flailing of extremities. A sitter should be continued.   impacted left femoral neck fracture S/p ORIF on 4/10 No noted drop in hgb.   Multiple psychiatric/mood disorders Stable on PO meds  Mental handicap  HTN BP stable on norvasc and lisinopril.  PUD Is on daily prilosec as outpt On protonix here  Hypothyroid Cont replacement tx  COPD  Stable ; cont scheduled nebs   tobacco abuse  Disposition- searching for SNF bed  Calvert Cantor, MD [facility] Office  [phone] Pager [phone]  On-Call/Text Page:      [email or web address]      password [facility]

## 2011-04-13 NOTE — Progress Notes (Signed)
PATIENT ID: [name]  MRN: [id number]  DOB/AGE:  57-May-1957 / 57 y.o.  2 Days Post-Op Procedure(s) (LRB): CANNULATED HIP PINNING (Left)  Subjective: Pain is mild.  No c/o chest pain or SOB.   Tells me he had a seizure last night.  Eager to get out of bed.   Objective: Vital signs in last 24 hours: Temp:  [98.7 F (37.1 C)-100.3 F (37.9 C)] 99.3 F (37.4 C) (04/12 0737) Pulse Rate:  [87-113] 108  (04/12 0700) Resp:  [20-33] 25  (04/12 0700) BP: (107-154)/(71-102) 137/89 mmHg (04/12 0700) SpO2:  [85 %-99 %] 91 % (04/12 0737)  Intake/Output from previous day: 04/11 0701 - 04/12 0700 In: 2840 [P.O.:2040; I.V.:800] Out: 3800 [Urine:3800] Intake/Output this shift:     Basename 04/13/11 0350 04/12/11 0350 04/11/11 2355 04/11/11 0709 04/11/11 0029  HGB 11.0* 11.9* 11.4* 11.1* 11.6*    Basename 04/13/11 0350 04/12/11 0350  WBC 4.6 6.6  RBC 4.24 4.55  HCT 34.8* 37.6*  PLT 184 166    Basename 04/13/11 0350 04/12/11 0350  NA 142 139  K 3.8 4.0  CL 107 105  CO2 26 23  BUN 9 8  CREATININE 0.82 0.69  GLUCOSE 91 112*  CALCIUM 9.0 9.2    Basename 04/13/11 0350 04/12/11 0350  LABPT -- --  INR 1.04 1.03    Physical Exam: Sensation intact distally Intact pulses distally Dorsiflexion/Plantar flexion intact Incision: no drainage  Assessment/Plan: 2 Days Post-Op Procedure(s) (LRB): CANNULATED HIP PINNING (Left)   Up with therapy Weight Bearing as Tolerated (WBAT)  VTE prophylaxis: pharmacologic prophylaxis (with any of the following: warfarin adjusted-dose) Ok for d/c from ortho standpoint when medically stable and showing appropriate progress with PT. Will follow.   Mable Paris 04/13/2011, 7:41 AM

## 2011-04-13 NOTE — Progress Notes (Signed)
ANTICOAGULATION CONSULT NOTE - Follow Up Consult  Pharmacy Consult for Coumadin Indication: VTE prophylaxis s/p hip surgery  Allergies  Allergen Reactions  . Peanut-Containing Drug Products Anaphylaxis  . Prolixin (Fluphenazine Hcl) Other (See Comments)    "muscle cramps"  . Thorazine (Chlorpromazine Hcl) Other (See Comments)    "burns"  . Lactose Intolerance (Gi) Rash    Patient Measurements: Height: 5\' 10"  (177.8 cm) Weight: 155 lb 13.8 oz (70.7 kg) IBW/kg (Calculated) : 73   Vital Signs: Temp: 99.3 F (37.4 C) (04/12 0737) Temp src: Axillary (04/12 0737) BP: 127/72 mmHg (04/12 1200) Pulse Rate: 87  (04/12 1200)  Labs:  Basename 04/13/11 0350 04/12/11 0350 04/11/11 2355  HGB 11.0* 11.9* --  HCT 34.8* 37.6* 35.6*  PLT 184 166 173  APTT -- -- --  LABPROT 13.8 13.7 14.1  INR 1.04 1.03 1.07  HEPARINUNFRC -- -- --  CREATININE 0.82 0.69 0.70  CKTOTAL -- -- --  CKMB -- -- --  TROPONINI -- -- --   Estimated Creatinine Clearance: 99.4 ml/min (by C-G formula based on Cr of 0.82).   Assessment: 55 yo M s/p hip surgery to have Coumadin for VTE prophylaxis x 30 days with lower goal INR of 1.5-2 per MD .INR remains low after 2 doses of 5 mg.  No overt bleeding noted.  Goal of Therapy:  INR 1.5-2.5   Plan:  1.  Repeat Coumadin 5 mg po x 1 tonight 2.  F/U AM INR 3.  Coumadin education book/video ordered yesterday, book given but video not shown, charted "does not apply". DC plan is for SNF. 4. LMWH 40 mg qday until INR > 2  Herby Abraham, Pharm.D. [phone] 04/13/2011 2:05 PM

## 2011-04-13 NOTE — Progress Notes (Signed)
Clinical social worker reviewed patient chart, and patient is listed to have a legal guardian Doyce Para. CSW left message with Doyce Para regarding patient discharge plans. CSW also left message with group home manager Nonnie Done.   CSW spoke with pt nurse who stated patient was beginning to have chest pain after being told patient was transfering to the ortho floor.   Per chart review and discussion with medical team, pt is from a group home and will need short term rehab at a skilled nursing facility.  Pt currently has Recruitment consultant, which will need to be discontinued for atleast 24 hours before patient can dc to short term rehab.   .Clinical social worker continuing to follow pt to assist with pt dc plans and further csw needs.   Catha Gosselin, Theresia Majors  [phone] .04/13/2011 1556pm

## 2011-04-13 NOTE — Progress Notes (Signed)
Pt awoke from his sleep requesting a breathing treatment per the bedside sitter, he then began rolling around in the bed restlessly and is currently complaining of left sided generalized constant sharp pain radiating to left arm down into the fingertips. Pt very agitated and raising voice. No changes noted in v/s or 5lead ECG rhythm. Dr Butler Denmark notified, orders given. Will continue to monitor. [place], [place] M

## 2011-04-14 LAB — BASIC METABOLIC PANEL
Calcium: 8.8 mg/dL (ref 8.4–10.5)
GFR calc non Af Amer: 80 mL/min — ABNORMAL LOW (ref >90)
Sodium: 136 mEq/L (ref 135–145)

## 2011-04-14 LAB — CARDIAC PANEL(CRET KIN+CKTOT+MB+TROPI)
CK, MB: 0.8 ng/mL (ref 0.3–4.0)
Relative Index: INVALID (ref 0.0–2.5)
Total CK: 62 U/L (ref 7–232)
Troponin I: <0.3 ng/mL (ref <0.30)

## 2011-04-14 LAB — GLUCOSE, CAPILLARY: Glucose-Capillary: 94 mg/dL (ref 70–99)

## 2011-04-14 LAB — PROTIME-INR: INR: 1.11 (ref 0.00–1.49)

## 2011-04-14 MED ORDER — WARFARIN SODIUM 7.5 MG PO TABS
7.5000 mg | ORAL_TABLET | Freq: Once | ORAL | Status: AC
  Administered 2011-04-14: 7.5 mg via ORAL
  Filled 2011-04-14: qty 1

## 2011-04-14 NOTE — Progress Notes (Signed)
ANTICOAGULATION CONSULT NOTE - Follow Up Consult  Pharmacy Consult for Coumadin Indication: VTE prophylaxis s/p hip surgery  Allergies  Allergen Reactions  . Peanut-Containing Drug Products Anaphylaxis  . Prolixin (Fluphenazine Hcl) Other (See Comments)    "muscle cramps"  . Thorazine (Chlorpromazine Hcl) Other (See Comments)    "burns"  . Lactose Intolerance (Gi) Rash    Patient Measurements: Height: 5\' 10"  (177.8 cm) Weight: 155 lb 13.8 oz (70.7 kg) IBW/kg (Calculated) : 73   Vital Signs: Temp: 98.3 F (36.8 C) (04/13 0600) Temp src: Oral (04/13 0600) BP: 120/77 mmHg (04/13 0600) Pulse Rate: 87  (04/13 0600)  Labs:  Basename 04/14/11 0700 04/14/11 0610 04/13/11 2220 04/13/11 1515 04/13/11 0350 04/12/11 0350 04/11/11 2355  HGB -- -- -- -- 11.0* 11.9* --  HCT -- -- -- -- 34.8* 37.6* 35.6*  PLT -- -- -- -- 184 166 173  APTT -- -- -- -- -- -- --  LABPROT 14.5 -- -- -- 13.8 13.7 --  INR 1.11 -- -- -- 1.04 1.03 --  HEPARINUNFRC -- -- -- -- -- -- --  CREATININE -- -- -- -- 0.82 0.69 0.70  CKTOTAL -- 62 62 72 -- -- --  CKMB -- 0.8 1.1 1.1 -- -- --  TROPONINI -- <0.30 <0.30 <0.30 -- -- --   Estimated Creatinine Clearance: 99.4 ml/min (by C-G formula based on Cr of 0.82).   Assessment: 56 yo M s/p hip surgery to have Coumadin for VTE prophylaxis x 30 days with lower goal INR of 1.5-2 per MD . Today INR =1.11.  INR remains low after 3 doses of 5 mg.  No overt bleeding noted.  Goal of Therapy:  INR 1.5-2.5   Plan:  1.  Coumadin 7.5 mg po x 1 tonight 2.  F/U AM INR 3. LMWH 40 mg qday until INR > 2  Noah Delaine, RPh 04/14/2011 , 16:32

## 2011-04-14 NOTE — Progress Notes (Signed)
Subjective: Patient unresponsive initially then he gave me his had. He move his legs, change position in the bed.  rolles his eyes back, now staring spell. Now touching his face, looking around in the room.  Objective: Filed Vitals:   04/14/11 0759 04/14/11 0800 04/14/11 0813 04/14/11 1400  BP:      Pulse:      Temp:      TempSrc:      Resp:  20    Height:      Weight:      SpO2: 96% 95% 96% 96%   Weight change:   Intake/Output Summary (Last 24 hours) at 04/14/11 1433 Last data filed at 04/14/11 1200  Gross per 24 hour  Intake    960 ml  Output   3350 ml  Net  -2390 ml    General: Alert, awake, oriented x3, in no acute distress.  HEENT: No bruits, no goiter.  Heart: Regular rate and rhythm, without murmurs, rubs, gallops.  Lungs: Crackles left side, bilateral air movement.  Abdomen: Soft, nontender, nondistended, positive bowel sounds.  Neuro: PERRL, blink to threat positive bilaterally, face symmetric, Change his position in the bed.     Lab Results:  [facility] 04/13/11 0350 04/12/11 0350  NA 142 139  K 3.8 4.0  CL 107 105  CO2 26 23  GLUCOSE 91 112*  BUN 9 8  CREATININE 0.82 0.69  CALCIUM 9.0 9.2  MG -- --  PHOS -- --   Basename 04/13/11 0350 04/12/11 0350  WBC 4.6 6.6  NEUTROABS -- --  HGB 11.0* 11.9*  HCT 34.8* 37.6*  MCV 82.1 82.6  PLT 184 166    Basename 04/14/11 0610 04/13/11 2220 04/13/11 1515  CKTOTAL 62 62 72  CKMB 0.8 1.1 1.1  CKMBINDEX -- -- --  TROPONINI <0.30 <0.30 <0.30    Medications: I have reviewed the patient's current medications.  Assessment/Plan:   Seizure  Continue with  Keppra to PO  Having pseudo- seizures occasionally- he is known to do this in an attempt to manipulate staff. At times these are staring spell and at other times they are shaking or flailing of extremities. A sitter should be continued.  Another episode today, will check EEG, Bmet.  impacted left femoral neck fracture  S/p ORIF on 5/10  PT per  ortho. Coumadin for DVT prophylaxis. Multiple psychiatric/mood disorders  Stable on PO meds  Mental handicap  HTN  BP stable on norvasc and lisinopril.  PUD  Is on daily prilosec as outpt  On protonix here  Hypothyroid  Cont replacement tx  COPD  Stable ; cont scheduled nebs  tobacco abuse Chest pain: EKG, had chest pain last night , cardiac enzymes negative.  On Lovenox for DVT prophylaxis.      LOS: 4 days   Meggin Ola M.D.  [facility] 04/14/2011, 2:33 PM

## 2011-04-14 NOTE — Progress Notes (Signed)
Subjective: 3 Days Post-Op Procedure(s) (LRB): CANNULATED HIP PINNING (Left) Patient reports pain as 1 on 0-10 scale.    Objective: Vital signs in last 24 hours: Temp:  [97.8 F (36.6 C)-98.6 F (37 C)] 98.3 F (36.8 C) (04/13 0600) Pulse Rate:  [87-110] 87  (04/13 0600) Resp:  [18-30] 20  (04/13 0800) BP: (111-132)/(48-83) 120/77 mmHg (04/13 0600) SpO2:  [92 %-100 %] 96 % (04/13 0813)  Intake/Output from previous day: 04/12 0701 - 04/13 0700 In: 1120 [P.O.:1120] Out: 3450 [Urine:3450] Intake/Output this shift:     Basename 04/13/11 0350 04/12/11 0350 04/11/11 2355  HGB 11.0* 11.9* 11.4*    Basename 04/13/11 0350 04/12/11 0350  WBC 4.6 6.6  RBC 4.24 4.55  HCT 34.8* 37.6*  PLT 184 166    Basename 04/13/11 0350 04/12/11 0350  NA 142 139  K 3.8 4.0  CL 107 105  CO2 26 23  BUN 9 8  CREATININE 0.82 0.69  GLUCOSE 91 112*  CALCIUM 9.0 9.2    Basename 04/14/11 0700 04/13/11 0350  LABPT -- --  INR 1.11 1.04   Left hip: Neurovascular intact Sensation intact distally Intact pulses distally Dorsiflexion/Plantar flexion intact Incision: no drainage Compartment soft  Assessment/Plan: 3 Days Post-Op Procedure(s) (LRB): CANNULATED HIP PINNING (Left) Plan: Up with therapy WBAT on Left Will follow. Ok from ortho viewpoint for dc to SNF. Will need f/u with Dr Ave Filter in 2 weeks.  Marvetta Vohs G 04/14/2011, 9:13 AM

## 2011-04-14 NOTE — Progress Notes (Signed)
Pt. Called nurse for substernal pain . While raising HOB pt. appeared unresponsive - stared ahead, would not move or answer back. 127/72,78,20. O2 sat = 07 on 2L/Min [place]. Resp. unlabored,skin w/d. Rapid Response called to assess. Pt w/ hx seizures. Dr Butler Denmark notified - no orders. Dr Carmell Austria notified - to see pt.

## 2011-04-14 NOTE — Progress Notes (Signed)
Site check; attempt -unsuccessful. IV team notified.

## 2011-04-14 NOTE — Progress Notes (Signed)
Called to Patients room because patient appeared to be unresponsive.  Upon arrival to Patients room, RN at bedside, patient laying supine in bed and staring on 2lpm nasal cannula, CBG 94..  VSS, pt not responding to any painful stimuli. Spoke to Dr. Butler Denmark and notified her of findings, Dr. Butler Denmark is familiar with patient and stated patient has pseudo seizures when he does not get his way, no new orders given but was informed that Dr. Carmell Austria is seeing him today.  Primary RN paged Dr. Carmell Austria to inform about episode.  No RRT interventions at this time.  RN instrusted to call RRT if assistance is needed

## 2011-04-15 LAB — PROTIME-INR
INR: 1.07 (ref 0.00–1.49)
Prothrombin Time: 14.1 seconds (ref 11.6–15.2)

## 2011-04-15 MED ORDER — WARFARIN SODIUM 10 MG PO TABS
10.0000 mg | ORAL_TABLET | Freq: Once | ORAL | Status: AC
  Administered 2011-04-15: 10 mg via ORAL
  Filled 2011-04-15: qty 1

## 2011-04-15 MED ORDER — POLYETHYLENE GLYCOL 3350 17 G PO PACK
17.0000 g | PACK | Freq: Every day | ORAL | Status: DC
  Administered 2011-04-15 – 2011-04-18 (×4): 17 g via ORAL
  Filled 2011-04-15 (×5): qty 1

## 2011-04-15 NOTE — Progress Notes (Signed)
Subjective: Patient feeling well. No complaints, no chest pain. He was able to remember me from yesterday.  Objective: Filed Vitals:   04/14/11 2349 04/15/11 0540 04/15/11 0800 04/15/11 0926  BP: 93/58 137/83    Pulse: 83 95    Temp: 97.9 F (36.6 C) 99.1 F (37.3 C)    TempSrc: Oral Oral    Resp: 18 18 18    Height:      Weight:      SpO2: 90% 92% 95% 91%   Weight change:   Intake/Output Summary (Last 24 hours) at 04/15/11 1311 Last data filed at 04/15/11 1144  Gross per 24 hour  Intake   3120 ml  Output   2800 ml  Net    320 ml    General: Alert, awake, oriented x3, in no acute distress.  HEENT: No bruits, no goiter.  Heart: Regular rate and rhythm, without murmurs, rubs, gallops.  Lungs: CTA, bilateral air movement.  Abdomen: Soft, nontender, nondistended, positive bowel sounds.  Neuro; awake , following command.   Lab Results:  [facility] 04/14/11 1541 04/13/11 0350  NA 136 142  K 4.1 3.8  CL 102 107  CO2 27 26  GLUCOSE 110* 91  BUN 14 9  CREATININE 1.02 0.82  CALCIUM 8.8 9.0  MG -- --  PHOS -- --    Basename 04/13/11 0350  WBC 4.6  NEUTROABS --  HGB 11.0*  HCT 34.8*  MCV 82.1  PLT 184    Basename 04/14/11 0610 04/13/11 2220 04/13/11 1515  CKTOTAL 62 62 72  CKMB 0.8 1.1 1.1  CKMBINDEX -- -- --  TROPONINI <0.30 <0.30 <0.30     Medications: I have reviewed the patient's current medications.  Seizure // Pseudoseizure Continue with Keppra to PO  Having pseudo- seizures occasionally- he is known to do this in an attempt to manipulate staff. At times these are staring spell and at other times they are shaking or flailing of extremities. A sitter should be continued.  No more episodes, i will discontinue EEG.   impacted left femoral neck fracture  S/p ORIF on 4/10  PT per ortho.  Coumadin for DVT prophylaxis.   Multiple psychiatric/mood disorders  Stable on PO meds  Mental handicap  HTN  BP stable on norvasc and lisinopril.  PUD  Is on  daily prilosec as outpt  On protonix here  Hypothyroid  Cont replacement tx  COPD  Stable ; cont scheduled nebs  tobacco abuse  Chest pain: resolved.  On Lovenox for DVT prophylaxis.      LOS: 5 days   Gabriana Wilmott M.D.  [facility] 04/15/2011, 1:11 PM

## 2011-04-15 NOTE — Progress Notes (Addendum)
Subjective: 4 Days Post-Op Procedure(s) (LRB): CANNULATED HIP PINNING (Left) Patient reports pain as 1 on 0-10 scale.   "I had a seizure yesterday"  Objective: Vital signs in last 24 hours: Temp:  [97.9 F (36.6 C)-99.1 F (37.3 C)] 99.1 F (37.3 C) (04/14 0540) Pulse Rate:  [83-100] 95  (04/14 0540) Resp:  [18-20] 18  (04/14 0540) BP: (93-138)/(58-87) 137/83 mmHg (04/14 0540) SpO2:  [85 %-97 %] 92 % (04/14 0540)  Intake/Output from previous day: 04/13 0701 - 04/14 0700 In: 2640 [P.O.:2640] Out: 2750 [Urine:2750] Intake/Output this shift: Total I/O In: 720 [P.O.:720] Out: 550 [Urine:550]   Basename 04/13/11 0350  HGB 11.0*    Basename 04/13/11 0350  WBC 4.6  RBC 4.24  HCT 34.8*  PLT 184    Basename 04/14/11 1541 04/13/11 0350  NA 136 142  K 4.1 3.8  CL 102 107  CO2 27 26  BUN 14 9  CREATININE 1.02 0.82  GLUCOSE 110* 91  CALCIUM 8.8 9.0    Basename 04/15/11 0500 04/14/11 0700  LABPT -- --  INR 1.07 1.11   Left hip: Surgical wound benign. Neurovascular intact Sensation intact distally Intact pulses distally Dorsiflexion/Plantar flexion intact Compartment soft  Assessment/Plan: 4 Days Post-Op Procedure(s) (LRB): CANNULATED HIP PINNING (Left) Plan: Up with therapy Discharge to SNF when stable from med viewpoint. WBAT on Left.  Stephan Nelis G 04/15/2011, 9:26 AM

## 2011-04-15 NOTE — Progress Notes (Addendum)
Clinical Social Work Department CLINICAL SOCIAL WORK PLACEMENT NOTE 04/15/2011  Patient:  [name], [name]  Account Number:  [id number] Admit date:  04/10/2011  Clinical Social Worker:  Noel Journey, CLINICAL SOCIAL WORKER  Date/time:  04/15/2011 08:30 AM  Clinical Social Work is seeking post-discharge placement for this patient at the following level of care:   SKILLED NURSING   (*CSW will update this form in Epic as items are completed)   04/15/2011  Patient/family provided with [facility] Department of Clinical Social Work's list of facilities offering this level of care within the geographic area requested by the patient (or if unable, by the patient's family).  04/15/2011  Patient/family informed of their freedom to choose among providers that offer the needed level of care, that participate in Medicare, Medicaid or managed care program needed by the patient, have an available bed and are willing to accept the patient.  04/15/2011  Patient/family informed of [facility]' ownership interest in [facility], as well as of the fact that they are under no obligation to receive care at this facility.  PASARR submitted to EDS on 04/13/2011 PASARR number received from EDS on 04/16/2011  FL2 transmitted to all facilities in geographic area requested by pt/family on  04/15/2011 FL2 transmitted to all facilities within larger geographic area on   Patient informed that his/her managed care company has contracts with or will negotiate with  certain facilities, including the following:     Patient/family informed of bed offers received:  04/16/2011 and 04/17/2011 Patient chooses bed at [facility] Physician recommends and patient chooses bed at    Patient to be transferred to  on   Patient to be transferred to facility by   The following physician request were entered in Epic:   Additional Comments:   Jacklynn Lewis, MSW, LCSWA  Clinical Social  Work [phone]

## 2011-04-15 NOTE — ED Provider Notes (Signed)
Dx: femoral neck fx, copd  Medical screening examination/treatment/procedure(s) were performed by non-physician practitioner and as supervising physician I was immediately available for consultation/collaboration.  Luiz Blare MD   Luiz Blare, MD 04/15/11 [phone]

## 2011-04-15 NOTE — Progress Notes (Addendum)
ANTICOAGULATION CONSULT NOTE - Follow Up Consult  Pharmacy Consult for Coumadin Indication: VTE prophylaxis s/p hip surgery  Allergies  Allergen Reactions  . Peanut-Containing Drug Products Anaphylaxis  . Prolixin (Fluphenazine Hcl) Other (See Comments)    "muscle cramps"  . Thorazine (Chlorpromazine Hcl) Other (See Comments)    "burns"  . Lactose Intolerance (Gi) Rash    Patient Measurements: Height: 5\' 10"  (177.8 cm) Weight: 155 lb 13.8 oz (70.7 kg) IBW/kg (Calculated) : 73   Vital Signs: Temp: 99.1 F (37.3 C) (04/14 0540) Temp src: Oral (04/14 0540) BP: 137/83 mmHg (04/14 0540) Pulse Rate: 95  (04/14 0540)  Labs:  Basename 04/15/11 0500 04/14/11 1541 04/14/11 0700 04/14/11 0610 04/13/11 2220 04/13/11 1515 04/13/11 0350  HGB -- -- -- -- -- -- 11.0*  HCT -- -- -- -- -- -- 34.8*  PLT -- -- -- -- -- -- 184  APTT -- -- -- -- -- -- --  LABPROT 14.1 -- 14.5 -- -- -- 13.8  INR 1.07 -- 1.11 -- -- -- 1.04  HEPARINUNFRC -- -- -- -- -- -- --  CREATININE -- 1.02 -- -- -- -- 0.82  CKTOTAL -- -- -- 62 62 72 --  CKMB -- -- -- 0.8 1.1 1.1 --  TROPONINI -- -- -- <0.30 <0.30 <0.30 --   Estimated Creatinine Clearance: 79.9 ml/min (by C-G formula based on Cr of 1.02).   Assessment: 56 yo M POD#4 s/p left hip surgery. On Coumadin for VTE prophylaxis x 30 days with lower goal INR of 1.5-2 per MD . Today INR =1.07.  INR remains subtherapeutic/ not increasing after  5 mg, 5mg , 5mg , and 7.5mg  since 04/11/11. May be due to drug interaction with Trazodone which decreases hypothrombinemic effect of coumadin. Continues on Lovenox 40mg  sq q24h for VTE prophylaxis until INR >2  No bleeding noted.  Goal of Therapy:  INR 1.5-2.5   Plan:  1.  Coumadin 10 mg po x 1 tonight  2.  F/U AM INR 3. LMWH 40 mg qday until INR > 1.5 (INR goal per MD).  Noah Delaine, RPh 04/14/2011 , 16:32

## 2011-04-15 NOTE — Progress Notes (Signed)
CSW spoke with Guardian who was agreeable to fax out in [employer].  Guardian concerned Pt will have extended, unnecessary stay.  CSW encouraged guardian to remain involved in care planning. Milus Banister MSW,LCSW w/e Coverage [phone]

## 2011-04-16 ENCOUNTER — Encounter (HOSPITAL_COMMUNITY): Payer: Self-pay | Admitting: Radiology

## 2011-04-16 ENCOUNTER — Inpatient Hospital Stay (HOSPITAL_COMMUNITY): Payer: Medicare Other

## 2011-04-16 DIAGNOSIS — F6381 Intermittent explosive disorder: Secondary | ICD-10-CM

## 2011-04-16 DIAGNOSIS — F259 Schizoaffective disorder, unspecified: Secondary | ICD-10-CM

## 2011-04-16 DIAGNOSIS — F70 Mild intellectual disabilities: Secondary | ICD-10-CM

## 2011-04-16 LAB — CBC
Hemoglobin: 11.1 g/dL — ABNORMAL LOW (ref 13.0–17.0)
MCH: 26.1 pg (ref 26.0–34.0)
RBC: 4.25 MIL/uL (ref 4.22–5.81)
WBC: 3.8 10*3/uL — ABNORMAL LOW (ref 4.0–10.5)

## 2011-04-16 LAB — BASIC METABOLIC PANEL
CO2: 28 mEq/L (ref 19–32)
GFR calc non Af Amer: >90 mL/min (ref >90)
Glucose, Bld: 109 mg/dL — ABNORMAL HIGH (ref 70–99)
Potassium: 4.3 mEq/L (ref 3.5–5.1)
Sodium: 136 mEq/L (ref 135–145)

## 2011-04-16 LAB — CARDIAC PANEL(CRET KIN+CKTOT+MB+TROPI)
CK, MB: 1.4 ng/mL (ref 0.3–4.0)
Relative Index: INVALID (ref 0.0–2.5)

## 2011-04-16 MED ORDER — VALPROATE SODIUM 500 MG/5ML IV SOLN: 750.0000 mg | Freq: Once | INTRAVENOUS | Status: DC

## 2011-04-16 MED ORDER — IOHEXOL 350 MG/ML SOLN
100.0000 mL | Freq: Once | INTRAVENOUS | Status: AC | PRN
  Administered 2011-04-16: 100 mL via INTRAVENOUS

## 2011-04-16 MED ORDER — SODIUM CHLORIDE 0.9 % IV SOLN
INTRAVENOUS | Status: DC
  Administered 2011-04-16 – 2011-04-17 (×2): via INTRAVENOUS

## 2011-04-16 MED ORDER — LEVETIRACETAM 750 MG PO TABS
750.0000 mg | ORAL_TABLET | Freq: Two times a day (BID) | ORAL | Status: DC
  Administered 2011-04-16: 750 mg via ORAL
  Filled 2011-04-16 (×3): qty 1

## 2011-04-16 MED ORDER — VALPROATE SODIUM 500 MG/5ML IV SOLN
1000.0000 mg | Freq: Once | INTRAVENOUS | Status: AC
  Administered 2011-04-16: 1000 mg via INTRAVENOUS
  Filled 2011-04-16: qty 10

## 2011-04-16 MED ORDER — WARFARIN SODIUM 10 MG PO TABS
10.0000 mg | ORAL_TABLET | Freq: Once | ORAL | Status: AC
  Administered 2011-04-16: 10 mg via ORAL
  Filled 2011-04-16: qty 1

## 2011-04-16 MED ORDER — DEXTROSE 5 % IV SOLN
750.0000 mg | Freq: Three times a day (TID) | INTRAVENOUS | Status: DC
  Administered 2011-04-17 (×2): 750 mg via INTRAVENOUS
  Filled 2011-04-16 (×4): qty 7.5

## 2011-04-16 NOTE — Consult Note (Addendum)
Reason for Consult:Pseudoseizures, hx Schizoaffective Disorder r/o psudoseizures Referring Physician: Dr. Peggye Massey is an 56 y.o. male.  HPI: Pt with MR fell in group home. He walked for several days and pain in left hip and leg became worse. ED evaluation established L hip fracture now surgically repaired.  Marland Kitchen  AXIS I Schizoaffective Disorder, Pseudoseizures,  AXIS II Mental Retardation  AXIS III Past Medical History  Diagnosis Date  . Seizures   . Psychiatric disorder   . Schizoaffective disorder   . Organic mood disorder   . Intermittent explosive disorder   . Generalized anxiety disorder   . Mild mental retardation   . COPD (chronic obstructive pulmonary disease)   . Hypertension   . Peptic ulcer   . Thyroid disease   . Prostate hyperplasia with urinary obstruction   . Blind left eye   . Chronic respiratory failure     Past Surgical History  Procedure Date  . Hip pinning,cannulated 04/11/2011    Procedure: CANNULATED HIP PINNING;  Surgeon: Phillip Paris, MD;  Location: [facility] OR;  Service: Orthopedics;  Laterality: Left;  Perc. Screw Fixation/Left  AXIS IV Acute L hip fracture, Guardian, Loss of independent care, Developmental delay AXIS V  GAF  44  Family History  Problem Relation Age of Onset  . Hypertension      Social History:  reports that he has been smoking Cigarettes.  He has been smoking about .25 packs per day. He has never used smokeless tobacco. He reports that he does not drink alcohol or use illicit drugs.  Allergies:  Allergies  Allergen Reactions  . Peanut-Containing Drug Products Anaphylaxis  . Prolixin (Fluphenazine Hcl) Other (See Comments)    "muscle cramps"  . Thorazine (Chlorpromazine Hcl) Other (See Comments)    "burns"  . Lactose Intolerance (Gi) Rash    Medications: I have reviewed the patient's current medications.  Results for orders placed during the hospital encounter of 04/10/11 (from the past 48 hour(s))    GLUCOSE, CAPILLARY     Status: Normal   Collection Time   04/14/11  1:57 PM      Component Value Range Comment   Glucose-Capillary 94  70 - 99 (mg/dL)    Comment 1 Notify RN     BASIC METABOLIC PANEL     Status: Abnormal   Collection Time   04/14/11  3:41 PM      Component Value Range Comment   Sodium 136  135 - 145 (mEq/L)    Potassium 4.1  3.5 - 5.1 (mEq/L)    Chloride 102  96 - 112 (mEq/L)    CO2 27  19 - 32 (mEq/L)    Glucose, Bld 110 (*) 70 - 99 (mg/dL)    BUN 14  6 - 23 (mg/dL)    Creatinine, Ser 9.56  0.50 - 1.35 (mg/dL)    Calcium 8.8  8.4 - 10.5 (mg/dL)    GFR calc non Af Amer 80 (*) >90 (mL/min)    GFR calc Af Amer >90  >90 (mL/min)   PROTIME-INR     Status: Normal   Collection Time   04/15/11  5:00 AM      Component Value Range Comment   Prothrombin Time 14.1  11.6 - 15.2 (seconds)    INR 1.07  0.00 - 1.49    PROTIME-INR     Status: Abnormal   Collection Time   04/16/11  7:42 AM      Component Value Range Comment  Prothrombin Time 15.9 (*) 11.6 - 15.2 (seconds)    INR 1.24  0.00 - 1.49    CARDIAC PANEL(CRET KIN+CKTOT+MB+TROPI)     Status: Normal   Collection Time   04/16/11 10:15 AM      Component Value Range Comment   Total CK 71  7 - 232 (U/L)    CK, MB 1.4  0.3 - 4.0 (ng/mL)    Troponin I <0.30  <0.30 (ng/mL)    Relative Index RELATIVE INDEX IS INVALID  0.0 - 2.5      Dg Chest 2 View  04/16/2011  *RADIOLOGY REPORT*  Clinical Data: Chest pain, recent hip surgery, history hypertension, COPD  CHEST - 2 VIEW  Comparison: 04/11/2011  Findings: Normal heart size, mediastinal contours, and pulmonary vascularity. Emphysematous and bronchitic changes. Left upper lobe scarring. Elevation left diaphragm, chronic. Minimal left basilar atelectasis. No acute infiltrate, pleural effusion or pneumothorax. No acute osseous findings. Broad-based dextroconvex thoracic scoliosis.  IMPRESSION: Emphysematous and chronic bronchitic changes with left upper lobe scarring and minimal  left basilar atelectasis.  Original Report Authenticated By: [name], M.D.    Review of Systems  Unable to perform ROS: other   Blood pressure 111/74, pulse 80, temperature 98.8 F (37.1 C), temperature source Oral, resp. rate 16, height 5\' 10"  (1.778 m), weight 70.7 kg (155 lb 13.8 oz), SpO2 90.00%. Physical Exam  Assessment/Plan:  Chart reviewed  Notes from PA and Dr. Beverely Massey appreciated.  RECOMMENDATION 1.  Agree with suggestion to use Depakote in lieu of Keppra which as a anticonvulsant and mood stabilizer [to address some of his psychiatric issues] would serve both purposes if he has seizures.   2.  EEG pending. 3. Agree with Dr. Morton Massey Neurology referral. 4. MD Psychiatrist signs off   [name] 04/16/2011, 12:29 PM

## 2011-04-16 NOTE — Progress Notes (Signed)
Occupational Therapy Treatment Patient Details Name: MARKEL KURTENBACH MRN: [id number] DOB: [birth year] Today's Date: 04/16/2011  OT Assessment/Plan OT Assessment/Plan Comments on Treatment Session: Making excellent progress with mobility and ADL. OT Plan: Discharge plan remains appropriate OT Frequency: Min 2X/week Follow Up Recommendations: Skilled nursing facility Equipment Recommended: Defer to next venue OT Goals Acute Rehab OT Goals OT Goal Formulation: With patient Time For Goal Achievement: 2 weeks ADL Goals Pt Will Perform Grooming: with supervision;Standing at sink ADL Goal: Grooming - Progress: Met Pt Will Perform Lower Body Bathing: with supervision;Sit to stand from bed ADL Goal: Lower Body Bathing - Progress: Progressing toward goals Pt Will Transfer to Toilet: with supervision;with DME;Ambulation;3-in-1 ADL Goal: Toilet Transfer - Progress: Met  OT Treatment Precautions/Restrictions  Precautions Precautions: Fall Restrictions LLE Weight Bearing: Weight bearing as tolerated   ADL ADL Grooming: Performed;Wash/dry hands;Supervision/safety Where Assessed - Grooming: Standing at sink Upper Body Bathing: Performed;Set up Where Assessed - Upper Body Bathing: Sitting, bed Toilet Transfer: Performed;Supervision/safety Toilet Transfer Method: Proofreader: Comfort height toilet Toileting - Clothing Manipulation: Performed;Supervision/safety Where Assessed - Toileting Clothing Manipulation: Standing Toileting - Hygiene: Performed;Independent Ambulation Related to ADLs: supervision @ RW level ADL Comments: Making progress with ADL. Mobility  Bed Mobility Bed Mobility: Yes Supine to Sit: 5: Supervision;HOB elevated (Comment degrees) Transfers Transfers: Yes Sit to Stand: 5: Supervision;From bed;From chair/3-in-1 Stand to Sit: 5: Supervision;With upper extremity assist;To chair/3-in-1 Exercises    End of Session OT - End of  Session Equipment Utilized During Treatment: Gait belt Activity Tolerance: Patient tolerated treatment well Patient left: in chair;with call bell in reach Nurse Communication: Other (comment) (c/o chest pain) General Behavior During Session: [facility] for tasks performed Cognition: Impaired, at baseline  Yarielys Beed,HILLARY  04/16/2011, 10:31 AM [facility], OTR/L  [phone] 04/16/2011

## 2011-04-16 NOTE — Progress Notes (Signed)
ANTICOAGULATION CONSULT NOTE - Follow Up Consult  Pharmacy Consult for Coumadin Indication: VTE prophylaxis s/p hip surgery  Allergies  Allergen Reactions  . Peanut-Containing Drug Products Anaphylaxis  . Prolixin (Fluphenazine Hcl) Other (See Comments)    "muscle cramps"  . Thorazine (Chlorpromazine Hcl) Other (See Comments)    "burns"  . Lactose Intolerance (Gi) Rash    Patient Measurements: Height: 5\' 10"  (177.8 cm) Weight: 155 lb 13.8 oz (70.7 kg) IBW/kg (Calculated) : 73   Vital Signs: Temp: 98.8 F (37.1 C) (04/15 0132) BP: 111/74 mmHg (04/15 0900) Pulse Rate: 80  (04/15 0900)  Labs:  Basename 04/16/11 0742 04/15/11 0500 04/14/11 1541 04/14/11 0700 04/14/11 0610 04/13/11 2220 04/13/11 1515  HGB -- -- -- -- -- -- --  HCT -- -- -- -- -- -- --  PLT -- -- -- -- -- -- --  APTT -- -- -- -- -- -- --  LABPROT 15.9* 14.1 -- 14.5 -- -- --  INR 1.24 1.07 -- 1.11 -- -- --  HEPARINUNFRC -- -- -- -- -- -- --  CREATININE -- -- 1.02 -- -- -- --  CKTOTAL -- -- -- -- 62 62 72  CKMB -- -- -- -- 0.8 1.1 1.1  TROPONINI -- -- -- -- <0.30 <0.30 <0.30   Estimated Creatinine Clearance: 79.9 ml/min (by C-G formula based on Cr of 1.02).   Assessment: 56 yo M POD#5 s/p left hip surgery. On Coumadin for VTE prophylaxis x 30 days with lower goal INR of 1.5-2 per MD . Today INR =1.24.  INR remains subtherapeutic/ not increasing after  5 mg, 5mg , 5mg , 7.5mg , 10 mg since 04/11/11. May be due to drug interaction with Trazodone which decreases hypothrombinemic effect of coumadin. Continues on Lovenox 40mg  sq q24h for VTE prophylaxis until INR >1.5.  No bleeding noted.  Goal of Therapy:  INR 1.5-2.5   Plan:  1.  Coumadin 10 mg po x 1 tonight  2.  F/U AM INR 3. LMWH 40 mg qday until INR > 1.5 (INR goal per MD).  Herby Abraham, Pharm.D. [phone] 04/16/2011 10:13 AM

## 2011-04-16 NOTE — Progress Notes (Signed)
Clinical Social Worker continuing to follow for discharge planning. Clinical Social Worker contacted [name] [name]) via telephone and provided Phillip bed offers. Clinical Social Worker provided supportive counseling as [name] discussed that she is hopeful Phillip will not have a lengthy stay at rehab facility. [name] ask for this Clinical Social Worker to follow up with her in regard to any other bed offers and [name] plans to research the facilities that have been able to offer a bed. Clinical Social Worker to follow up with [name] and facilitate Phillip discharge needs when Phillip medically stable for discharge and bed available at SNF.    [name], MSW, LCSWA  Clinical Social Work [phone]

## 2011-04-16 NOTE — Consult Note (Addendum)
[facility] CONSULT [facility]     Reason for Consult: seizure   HPI:    [name] is an 56 y.o. male who was brought to the hospital after a fall in his bathroom resulting in Left hip fracture. While in the hospital patient has had multiple episodes where he will stare off and be non responsive.   He was recently admitted to the hospital 03/10/11 where he exhibited the same behavior.  At that time EEG was performed and showed presence of left frontal central slowing combined with sharp waves in the same region suggest an underlying possible source of seizures but No electrographic seizures, nor nonconvulsive status epilepticus were seen.   At that time he was on Keppra 1 gram BID and Dilantin 100 mg q 8 hours. On admission to hospital 04/10/11 patient was no longer on either medication. Currently he has been placed back on Keppra 500 BID.  Neurology was asked to evaluate again for seizure.   Past Medical History  Diagnosis Date  . Seizures   . Psychiatric disorder   . Schizoaffective disorder   . Organic mood disorder   . Intermittent explosive disorder   . Generalized anxiety disorder   . Mild mental retardation   . COPD (chronic obstructive pulmonary disease)   . Hypertension   . Peptic ulcer   . Thyroid disease   . Prostate hyperplasia with urinary obstruction   . Blind left eye   . Chronic respiratory failure     Past Surgical History  Procedure Date  . Hip pinning,cannulated 04/11/2011    Procedure: CANNULATED HIP PINNING;  Surgeon: Mable Paris, MD;  Location: [facility] OR;  Service: Orthopedics;  Laterality: Left;  Perc. Screw Fixation/Left    Family History  Problem Relation Age of Onset  . Hypertension      Social History:  reports that he has been smoking Cigarettes.  He has been smoking about .25 packs per day. He has never used smokeless tobacco. He reports that he does not drink alcohol or use illicit drugs.  Allergies  Allergen  Reactions  . Peanut-Containing Drug Products Anaphylaxis  . Prolixin (Fluphenazine Hcl) Other (See Comments)    "muscle cramps"  . Thorazine (Chlorpromazine Hcl) Other (See Comments)    "burns"  . Lactose Intolerance (Gi) Rash    Medications:    Scheduled:   . albuterol  2.5 mg Nebulization TID  . amLODipine  10 mg Oral Daily  . budesonide  0.5 mg Nebulization BID  . enoxaparin  40 mg Subcutaneous Q24H  . fluticasone  2 puff Inhalation BID  . ipratropium  0.5 mg Nebulization TID  . levETIRAcetam  500 mg Oral BID  . levothyroxine  75 mcg Oral Daily  . lisinopril  10 mg Oral Daily  . lithium carbonate  300 mg Oral TID WC  . mupirocin ointment  1 application Nasal BID  . nicotine  14 mg Transdermal Daily  . pantoprazole  40 mg Oral Q1200  . polyethylene glycol  17 g Oral Daily  . risperiDONE  1 mg Oral Daily  . risperiDONE  2 mg Oral Q2000  . Tamsulosin HCl  0.4 mg Oral Daily  . traZODone  100 mg Oral QHS  . warfarin  10 mg Oral ONCE-1800  . warfarin  10 mg Oral ONCE-1800  . warfarin   Does not apply Once  .  Warfarin - Pharmacist Dosing Inpatient   Does not apply q1800    Review of Systems - General ROS: negative for - chills, fatigue, fever or hot flashes Hematological and Lymphatic ROS: negative for - bruising, fatigue, jaundice or pallor Endocrine ROS: negative for - hair pattern changes, hot flashes, mood swings or skin changes Respiratory ROS: negative for - cough, hemoptysis, orthopnea or wheezing Cardiovascular ROS: negative for - dyspnea on exertion, orthopnea, palpitations or shortness of breath Gastrointestinal ROS: negative for - abdominal pain, appetite loss, blood in stools, diarrhea or hematemesis Musculoskeletal ROS: negative for - joint pain, joint stiffness, joint swelling or muscle pain Neurological ROS: See HPI Dermatological ROS: negative for dry skin, pruritus and rash   Blood pressure 111/74, pulse 80, temperature 98.8 F (37.1 C), temperature source  Oral, resp. rate 16, height 5\' 10"  (1.778 m), weight 70.7 kg (155 lb 13.8 oz), SpO2 90.00%.   Neurologic Examination:  Mental Status:  Alert, not oriented,. Speech dysarthric follows all my commands.  Cranial Nerves:  II-Visual fields intact on the right but shows a left hemianopsia.  III/IV/VI-Extraocular movements intact. Pupils reactive bilaterally with a ADP in the left  V/VII-Smile symmetric  VIII-grossly intact  IX/X-normal gag  XI-bilateral shoulder shrug  XII-midline tongue extension  Motor: 5/5 bilaterally with normal tone and bulk moving all extremities antigravity  Sensory: Pinprick and light touch intact throughout, bilaterally  Deep Tendon Reflexes: 2+ and symmetric throughout  Plantars: mute bilaterally    Lab Results  Component Value Date/Time   CHOL 150 02/01/2011 10:10 PM    Results for orders placed during the hospital encounter of 04/10/11 (from the past 48 hour(s))  GLUCOSE, CAPILLARY     Status: Normal   Collection Time   04/14/11  1:57 PM      Component Value Range Comment   Glucose-Capillary 94  70 - 99 (mg/dL)    Comment 1 Notify RN     BASIC METABOLIC PANEL     Status: Abnormal   Collection Time   04/14/11  3:41 PM      Component Value Range Comment   Sodium 136  135 - 145 (mEq/L)    Potassium 4.1  3.5 - 5.1 (mEq/L)    Chloride 102  96 - 112 (mEq/L)    CO2 27  19 - 32 (mEq/L)    Glucose, Bld 110 (*) 70 - 99 (mg/dL)    BUN 14  6 - 23 (mg/dL)    Creatinine, Ser 1.61  0.50 - 1.35 (mg/dL)    Calcium 8.8  8.4 - 10.5 (mg/dL)    GFR calc non Af Amer 80 (*) >90 (mL/min)    GFR calc Af Amer >90  >90 (mL/min)   PROTIME-INR     Status: Normal   Collection Time   04/15/11  5:00 AM      Component Value Range Comment   Prothrombin Time 14.1  11.6 - 15.2 (seconds)    INR 1.07  0.00 - 1.49    PROTIME-INR     Status: Abnormal   Collection Time   04/16/11  7:42 AM      Component Value Range Comment   Prothrombin Time 15.9 (*) 11.6 - 15.2 (seconds)    INR  1.24  0.00 - 1.49      No results found.   Assessment/Plan:   56 YO male with known schizoaffective disorder, organic mood disorder, intermittent explosive disorder, and question of seizure disorder.  Patient has been evaluated back in March  for similar staring episodes and at that time placed on Keppra and Dilantin.  On admission to hospital he was no longer on those medications.  While in the hospital he has had multiple episodes of staring spells, unresponsiveness with no postical state or urinary incontinence.    Recommend: 1) prolonged EEG 2) continue Keppra will discuss with Dr. Lyman Speller changing to Depakote as patient may benefit from the mood stabilization and antiepileptic effect of this drug.    Felicie Morn PA-C [facility] [phone]  04/16/2011, 10:29 AM

## 2011-04-16 NOTE — Progress Notes (Signed)
Subjective: Patient awake, following command. Per nurse patient cough small amount of blood today.  Objective: Filed Vitals:   04/16/11 0725 04/16/11 0900 04/16/11 1300 04/16/11 1700  BP:  111/74 108/76 120/65  Pulse:  80 91 84  Temp:   97.8 F (36.6 C)   TempSrc:      Resp:  16 16 20   Height:      Weight:      SpO2: 95% 90% 95% 94%   Weight change:   Intake/Output Summary (Last 24 hours) at 04/16/11 1718 Last data filed at 04/15/11 2210  Gross per 24 hour  Intake    240 ml  Output    850 ml  Net   -610 ml    General: Alert, awake, oriented x3, in no acute distress.  HEENT: No bruits, no goiter.  Heart: Regular rate and rhythm, without murmurs, rubs, gallops.  Lungs: Crackles left side, bilateral air movement.  Abdomen: Soft, nontender, nondistended, positive bowel sounds.  Neuro: Awake , following command.    Lab Results:  [facility] 04/16/11 1450 04/14/11 1541  NA 136 136  K 4.3 4.1  CL 100 102  CO2 28 27  GLUCOSE 109* 110*  BUN 18 14  CREATININE 0.89 1.02  CALCIUM 9.6 8.8  MG -- --  PHOS -- --    Basename 04/16/11 1450  WBC 3.8*  NEUTROABS --  HGB 11.1*  HCT 35.6*  MCV 83.8  PLT 238    Basename 04/16/11 1015 04/14/11 0610 04/13/11 2220  CKTOTAL 71 62 62  CKMB 1.4 0.8 1.1  CKMBINDEX -- -- --  TROPONINI <0.30 <0.30 <0.30    Micro Results: No results found for this or any previous visit (from the past 240 hour(s)).  Studies/Results: Dg Chest 2 View  04/16/2011  *RADIOLOGY REPORT*  Clinical Data: Chest pain, recent hip surgery, history hypertension, COPD  CHEST - 2 VIEW  Comparison: 04/11/2011  Findings: Normal heart size, mediastinal contours, and pulmonary vascularity. Emphysematous and bronchitic changes. Left upper lobe scarring. Elevation left diaphragm, chronic. Minimal left basilar atelectasis. No acute infiltrate, pleural effusion or pneumothorax. No acute osseous findings. Broad-based dextroconvex thoracic scoliosis.  IMPRESSION:  Emphysematous and chronic bronchitic changes with left upper lobe scarring and minimal left basilar atelectasis.  Original Report Authenticated By: Lollie Marrow, M.D.   Ct Angio Chest W/cm &/or Wo Cm  04/16/2011  *RADIOLOGY REPORT*  Clinical Data: Hemoptysis, chest pain, evaluate for PE  CT ANGIOGRAPHY CHEST  Technique:  Multidetector CT imaging of the chest using the standard protocol during bolus administration of intravenous contrast. Multiplanar reconstructed images including MIPs were obtained and reviewed to evaluate the vascular anatomy.  Contrast: OMNIPAQUE IOHEXOL 350 MG/ML SOLN  Comparison: Chest radiographs dated 04/16/2011.  [name] Long CT chest dated 03/18/2011.  Findings: No evidence of pulmonary embolism.  Again seen are scattered mild patchy opacities in the bilateral upper lobes and right lower lobe, likely reflecting scattered areas of scarring and/or atelectasis. Residual infection is possible but is considered less likely given lack of interval change.  Scarring at the bilateral lung bases.  Mild centrilobular and paraseptal emphysematous changes.  No pleural effusion or pneumothorax.  Visualized thyroid is unremarkable.  The heart is normal in size.  No pericardial effusion.  Coronary atherosclerosis.  Atherosclerotic calcifications of the aortic arch.  Stable mediastinal/right hilar lymphadenopathy, including: --1.6 cm prevascular node (series 4/image 49) --1.3 cm AP window node (series 4/image 37) --1.7 cm right hilar node (series 4/image 47)  Visualized  upper abdomen is unremarkable.  Degenerative changes of the visualized thoracolumbar spine.  IMPRESSION: No evidence of pulmonary embolism.  Stable scattered mild patchy opacities in the bilateral upper lobes and right lower lobe, likely reflecting scattered areas of scarring and/or atelectasis. Residual infection is possible but considered less likely given lack of interval change from recent CT.  Underlying mild centrilobular and  paraseptal emphysematous changes.  Stable mediastinal/right hilar lymphadenopathy, as described above.  Original Report Authenticated By: Charline Bills, M.D.    Medications: I have reviewed the patient's current medications.  Seizure // Pseudoseizure  Appreciate neuro and Psych. Psych agree with Depakote.  EEG ordered. Neuro will adjust his medications.  Having pseudo- seizures occasionally- he is known to do this in an attempt to manipulate staff. At times these are staring spell and at other times they are shaking or flailing of extremities. A sitter should be continued.   impacted left femoral neck fracture  S/p ORIF on 4/10  PT per ortho.  Coumadin for DVT prophylaxis.  Multiple psychiatric/mood disorders  Stable on PO meds  Mental handicap  HTN  Hold lisinopril, received contrast today.  PUD  Is on daily prilosec as outpt  On protonix here  Hypothyroid  Cont replacement tx  COPD  Stable ; cont scheduled nebs  tobacco abuse  Chest pain: Patient was complaining of chest pain, per nurse small amount of blood sputum. CT chest negative for PE. Will monitor closely for recurrent hemoptysis, if reocur will consider stop coumadin.  On Lovenox for DVT prophylaxis.      LOS: 6 days   Lilianne Delair M.D.  [facility] 04/16/2011, 5:18 PM

## 2011-04-16 NOTE — Progress Notes (Signed)
Clinical Social Work  CSW attempted to meet with patient but patient was out of the room for a procedure. CSW called patient's legal guardian Fredia Sorrow) and left a message. CSW spoke with patient's caregiver Alycia Rossetti). Patient lives in a home with just him and a caregiver since Jan 2013. Per caregiver, patient has no medical problems besides respiratory issues. Patient has never had seizures with caregiver. Caregiver reports that patient sees several doctors that prescribe Trazadone, Risperdal and Lithium. Caregiver reports that these medications work well with patient. Caregiver reports that patient tried to elope when he first moved in with him but it was because patient was upset about caregiver limiting his cigarettes. Patient has not tried to elope in several months. Per caregiver, patient's coffee should be limited to prevent agitation. CSW informed RN of this information. Patient does not have mental health treatment and gets prescriptions through PCP. CSW will follow up and will complete full assessment with patient at a later time.  [place], [place] [phone] (Coverage for [employer])

## 2011-04-16 NOTE — Progress Notes (Addendum)
Pt sitting upright in bed asking for coffee and to watch the news.  Decaf coffee given along with scheduled lithium and coumadin.  No seizure activity at this time.  Awaiting new order of IV Depacon from pharmacy.

## 2011-04-16 NOTE — Progress Notes (Signed)
At 0115 this morning 04/16/11, patient contacted me stating that he was feeling as if a seizure is coming on.  His heart rate initially was very high HR 210 but it came down to 86 in a matter of seconds.  Other vitals were T:97.4, R:22, BP 125/81 and his oxygen was off, his oxygen saturation was 89% on RA.  I placed 2L of O2 on him and repositioned him in the bed.  I asked for his pain rate, he was unable to tell me.  His vitals improved.  I monitored him and he continued to improve.  Vitals at 0132 T: 98.8. HR: 76, R: 18, BP: 120/80, O2: 96% on 2 liters.  He was able to tell me his pain rate at that time and I administered 2 Vicodins for pain.  I placed a continuous pulse ox on patient and he has remained stable and responsive.

## 2011-04-16 NOTE — Consult Note (Signed)
Reason for Consult:Pseudoseizures, hx Schizoaffective Disorder, r/o psueudoseizures Referring Physician: Dr. Eldridge Abrahams is an 56 y.o. male.  HPI: Pt with MR fell in group home.  He walked for several days and pain in left hip and leg became worse.  ED evaluation  established L hip fracture now repaired. Marland Kitchen    AXIS I Schizoaffective Disorder, Pseudoseizures,   AXIS II Mental Retardation AXIS  III Past Medical History  Diagnosis Date  . Seizures   . Psychiatric disorder   . Schizoaffective disorder   . Organic mood disorder   . Intermittent explosive disorder   . Generalized anxiety disorder   . Mild mental retardation   . COPD (chronic obstructive pulmonary disease)   . Hypertension   . Peptic ulcer   . Thyroid disease   . Prostate hyperplasia with urinary obstruction   . Blind left eye   . Chronic respiratory failure     Past Surgical History  Procedure Date  . Hip pinning,cannulated 04/11/2011    Procedure: CANNULATED HIP PINNING;  Surgeon: Mable Paris, MD;  Location: [facility] OR;  Service: Orthopedics;  Laterality: Left;  Perc. Screw Fixation/Left  AXIS IV Acute hip fracture, Guardian, Loss of independent care, Developmental delay AXIS V  GAF 45  Family History  Problem Relation Age of Onset  . Hypertension      Social History:  reports that he has been smoking Cigarettes.  He has been smoking about .25 packs per day. He has never used smokeless tobacco. He reports that he does not drink alcohol or use illicit drugs.  Allergies:  Allergies  Allergen Reactions  . Peanut-Containing Drug Products Anaphylaxis  . Prolixin (Fluphenazine Hcl) Other (See Comments)    "muscle cramps"  . Thorazine (Chlorpromazine Hcl) Other (See Comments)    "burns"  . Lactose Intolerance (Gi) Rash    Medications: I have reviewed the patient's current medications.  Results for orders placed during the hospital encounter of 04/10/11 (from the past 48 hour(s))  GLUCOSE,  CAPILLARY     Status: Normal   Collection Time   04/14/11  1:57 PM      Component Value Range Comment   Glucose-Capillary 94  70 - 99 (mg/dL)    Comment 1 Notify RN     BASIC METABOLIC PANEL     Status: Abnormal   Collection Time   04/14/11  3:41 PM      Component Value Range Comment   Sodium 136  135 - 145 (mEq/L)    Potassium 4.1  3.5 - 5.1 (mEq/L)    Chloride 102  96 - 112 (mEq/L)    CO2 27  19 - 32 (mEq/L)    Glucose, Bld 110 (*) 70 - 99 (mg/dL)    BUN 14  6 - 23 (mg/dL)    Creatinine, Ser 1.61  0.50 - 1.35 (mg/dL)    Calcium 8.8  8.4 - 10.5 (mg/dL)    GFR calc non Af Amer 80 (*) >90 (mL/min)    GFR calc Af Amer >90  >90 (mL/min)   PROTIME-INR     Status: Normal   Collection Time   04/15/11  5:00 AM      Component Value Range Comment   Prothrombin Time 14.1  11.6 - 15.2 (seconds)    INR 1.07  0.00 - 1.49    PROTIME-INR     Status: Abnormal   Collection Time   04/16/11  7:42 AM      Component Value Range  Comment   Prothrombin Time 15.9 (*) 11.6 - 15.2 (seconds)    INR 1.24  0.00 - 1.49      No results found.  Review of Systems  Unable to perform ROS: other   Blood pressure 111/74, pulse 80, temperature 98.8 F (37.1 C), temperature source Oral, resp. rate 16, height 5\' 10"  (1.778 m), weight 70.7 kg (155 lb 13.8 oz), SpO2 90.00%. Physical Exam  Assessment/Plan:  Chart Reviewed Discussed with Dr. Nada Libman, RN, Student Nurse Pt is sitting up in bed.  He is very vocal.  He has good eye contact and is slightly dysarthric in speech and stammers.  He announces "I had a seizure'  RN reports he stared into space, Student nurse says he stared ~ 2 2/1 min.  RN asked to take his pulse and he ;ifted his arm.  He says he felt the seizure coming on 'in his head'  VS were within normal limits PO2 dropped to 89% and corrected to 96% [unknown if the nasal cannula was on at time of seizure].  Pt says he had just returned from the bathroom  He announces that he is not able to return to the  group home.  'I have to go to a rest home to be watched all the time'.  He is focused on this thought and states it several times.  He is informed that he will need to go to a SNF to gain strength and mobility after his fracture.  This does not seem to register with him.  He repeats his request.   Pt does not know date and time.  He is able to explain what had happened to him.  There are no bizarre behaviors  Or thoughts expressed.  Question of seizures or pseudoseizures.  It is important to rule out actual seizures.  A 24 hour video EEG is effective in detecting [but not always] seizure activity, especially to correlate behavior with/or without signs of brain wave seizure documentation.  It is noted that this patient announces that he can tell his seizure is coming on.  In Absence, or [employer], i.e. Staring seizure.  The person becomes suddenly still, stares; does not speak or move.  The seizure is brief and there is no tonic clonic movement as in the [employer] T/C seizures.  The fact that he announces his seizure questions the validity of Absence seizures.  Another clinical test during seizure with pt lying in bed is to lift the had over the patient's head.  IF it is a pseudoseizure, the arm will be directed voluntarily away from the face.  It is noted that his announcement of seizures this morning is followed by his insistence that he not be returned to the group home.  He may be unable to say what is happening that makes him insist on avoiding this place.  He has a Guardian and collaborative information may be useful.  It will also be informative to know how long he has had the pseudoseizures.  These need not be medicated IF it can be demonstrated that there is no EEG correlation with his seizure-like behavior.  This represents a cost savings to medical care and reduction of risk:benefit when medicating patients.  RECOMMENDATION 1. Request Psych CSW, this week Jeanice Lim, to contact Guardian/Group home for  collateral information 2. Suggest EEG, understanding test time may not capture time of his pseudoseizure, to establish brain activity pattern 3. Evaluate need for Keppra medication.  4. QTc interval and age  is adequate for SGA Risperdal, risperdone, medication for Schizoaffective disorder and Intermittent Explosive Disorder, No EPS observed or reported 4. No further psychiatric needs identified  MD Psychiatrist signs off.  Ossiel Marchio 04/16/2011, 10:20 AM

## 2011-04-16 NOTE — Consult Note (Signed)
Will load patient with Depakote as per psychiatry and Dr. Christen Bame request. Will check level in am. Will attempt to arrange monitoring in am.  Carmell Austria, MD

## 2011-04-16 NOTE — Progress Notes (Signed)
PT Cancel Note:   PT session cancelled per RN request due to seizure activity.    Verdell Face, [place] [phone]. 04/16/2011

## 2011-04-16 NOTE — Consult Note (Signed)
A routine EEG will not add any further information in this case. He already had an EEG on 3/9 that proves he has a seizure focus. He needs to be on an anti-convulsant medication. The question for psychiatry is whether patient would benefit from being on Depakote given his psychiatric  history vs. staying on Keppra. We will attempt to do an extended video EEG tomorrow to try and capture some of these other non-organic events, but optimally he needs long-term video EEG monitoring that we do not have in this facility. Down the line patient can follow with Dr. Modesto Charon at [facility].   Carmell Austria, MD

## 2011-04-16 NOTE — Progress Notes (Addendum)
At 5pm, after returning from CT scan and eating dinner, pt reports "I think I feel a seizure coming on."  Pt assisted back to bed from chair.  Shortly afterwards, pt stared off to right, became unresponsive to verbal stimuli, moved arms slightly up and and down but no jerking movements.   All VS stable. Oxygen applied at 2L and continuous pulse ox.   Few minutes later, pt able to nod slightly to questions and squeezed fingers upon command.  Dr. [name] present shortly thereafter and notified of pt findings.  Orders received.  Will continue to monitor closely.

## 2011-04-17 ENCOUNTER — Inpatient Hospital Stay (HOSPITAL_COMMUNITY): Payer: Medicare Other

## 2011-04-17 LAB — BASIC METABOLIC PANEL
BUN: 19 mg/dL (ref 6–23)
Chloride: 105 mEq/L (ref 96–112)
GFR calc Af Amer: >90 mL/min (ref >90)
GFR calc non Af Amer: >90 mL/min (ref >90)
Potassium: 4.3 mEq/L (ref 3.5–5.1)
Sodium: 140 mEq/L (ref 135–145)

## 2011-04-17 LAB — CBC
Hemoglobin: 11 g/dL — ABNORMAL LOW (ref 13.0–17.0)
MCH: 26.1 pg (ref 26.0–34.0)
Platelets: 239 10*3/uL (ref 150–400)
RBC: 4.22 MIL/uL (ref 4.22–5.81)
WBC: 4.5 10*3/uL (ref 4.0–10.5)

## 2011-04-17 LAB — PROTIME-INR: Prothrombin Time: 20.9 seconds — ABNORMAL HIGH (ref 11.6–15.2)

## 2011-04-17 LAB — VALPROIC ACID LEVEL: Valproic Acid Lvl: 59 ug/mL (ref 50.0–100.0)

## 2011-04-17 MED ORDER — LEVETIRACETAM 500 MG PO TABS
500.0000 mg | ORAL_TABLET | Freq: Two times a day (BID) | ORAL | Status: DC
  Administered 2011-04-17 – 2011-04-19 (×4): 500 mg via ORAL
  Filled 2011-04-17 (×6): qty 1

## 2011-04-17 MED ORDER — DIVALPROEX SODIUM 125 MG PO CPSP
750.0000 mg | ORAL_CAPSULE | Freq: Three times a day (TID) | ORAL | Status: DC
  Administered 2011-04-17 – 2011-04-19 (×4): 750 mg via ORAL
  Filled 2011-04-17 (×9): qty 6

## 2011-04-17 MED ORDER — WARFARIN SODIUM 5 MG PO TABS
5.0000 mg | ORAL_TABLET | Freq: Once | ORAL | Status: DC
  Filled 2011-04-17: qty 1

## 2011-04-17 MED ORDER — WARFARIN SODIUM 7.5 MG PO TABS
7.5000 mg | ORAL_TABLET | Freq: Once | ORAL | Status: DC
  Filled 2011-04-17: qty 1

## 2011-04-17 NOTE — Progress Notes (Signed)
Subjective: Doing ok, he wonders if is going to rehab.  Objective: Filed Vitals:   04/16/11 1700 04/16/11 2030 04/16/11 2153 04/17/11 0600  BP: 120/65  117/75 113/71  Pulse: 84 92 86 78  Temp:   97.5 F (36.4 C) 98.6 F (37 C)  TempSrc:      Resp: 20 20 20 20   Height:      Weight:      SpO2: 94% 95% 90% 90%   Weight change:   Intake/Output Summary (Last 24 hours) at 04/17/11 1105 Last data filed at 04/17/11 0655  Gross per 24 hour  Intake 906.25 ml  Output   2250 ml  Net -1343.75 ml    General: Alert, awake, oriented x3, in no acute distress.  HEENT: No bruits, no goiter.  Heart: Regular rate and rhythm, without murmurs, rubs, gallops.  Lungs: CTA, bilateral air movement.  Abdomen: Soft, nontender, nondistended, positive bowel sounds.  Neuro: Grossly intact, nonfocal. Extremities no edema.  Lab Results:  [facility] 04/17/11 0521 04/16/11 1450  NA 140 136  K 4.3 4.3  CL 105 100  CO2 27 28  GLUCOSE 81 109*  BUN 19 18  CREATININE 0.86 0.89  CALCIUM 9.2 9.6  MG -- --  PHOS -- --    Basename 04/16/11 1450  WBC 3.8*  NEUTROABS --  HGB 11.1*  HCT 35.6*  MCV 83.8  PLT 238    Basename 04/16/11 1015  CKTOTAL 71  CKMB 1.4  CKMBINDEX --  TROPONINI <0.30    Micro Results: No results found for this or any previous visit (from the past 240 hour(s)).  Studies/Results: Dg Chest 2 View  04/16/2011  *RADIOLOGY REPORT*  Clinical Data: Chest pain, recent hip surgery, history hypertension, COPD  CHEST - 2 VIEW  Comparison: 04/11/2011  Findings: Normal heart size, mediastinal contours, and pulmonary vascularity. Emphysematous and bronchitic changes. Left upper lobe scarring. Elevation left diaphragm, chronic. Minimal left basilar atelectasis. No acute infiltrate, pleural effusion or pneumothorax. No acute osseous findings. Broad-based dextroconvex thoracic scoliosis.  IMPRESSION: Emphysematous and chronic bronchitic changes with left upper lobe scarring and minimal left  basilar atelectasis.  Original Report Authenticated By: Lollie Marrow, M.D.   Ct Angio Chest W/cm &/or Wo Cm  04/16/2011  *RADIOLOGY REPORT*  Clinical Data: Hemoptysis, chest pain, evaluate for PE  CT ANGIOGRAPHY CHEST  Technique:  Multidetector CT imaging of the chest using the standard protocol during bolus administration of intravenous contrast. Multiplanar reconstructed images including MIPs were obtained and reviewed to evaluate the vascular anatomy.  Contrast: OMNIPAQUE IOHEXOL 350 MG/ML SOLN  Comparison: Chest radiographs dated 04/16/2011.  [name] Long CT chest dated 03/18/2011.  Findings: No evidence of pulmonary embolism.  Again seen are scattered mild patchy opacities in the bilateral upper lobes and right lower lobe, likely reflecting scattered areas of scarring and/or atelectasis. Residual infection is possible but is considered less likely given lack of interval change.  Scarring at the bilateral lung bases.  Mild centrilobular and paraseptal emphysematous changes.  No pleural effusion or pneumothorax.  Visualized thyroid is unremarkable.  The heart is normal in size.  No pericardial effusion.  Coronary atherosclerosis.  Atherosclerotic calcifications of the aortic arch.  Stable mediastinal/right hilar lymphadenopathy, including: --1.6 cm prevascular node (series 4/image 49) --1.3 cm AP window node (series 4/image 37) --1.7 cm right hilar node (series 4/image 47)  Visualized upper abdomen is unremarkable.  Degenerative changes of the visualized thoracolumbar spine.  IMPRESSION: No evidence of pulmonary embolism.  Stable  scattered mild patchy opacities in the bilateral upper lobes and right lower lobe, likely reflecting scattered areas of scarring and/or atelectasis. Residual infection is possible but considered less likely given lack of interval change from recent CT.  Underlying mild centrilobular and paraseptal emphysematous changes.  Stable mediastinal/right hilar lymphadenopathy, as  described above.  Original Report Authenticated By: Charline Bills, M.D.    Medications: I have reviewed the patient's current medications.  56 year old with PMH MR, seizure, pseudoseizure, admitted after mechanical fall found to have left femoral neck fracture. Patient had surgery 04-11-2011. Patient has had episodes of ? Pseudoseizure while in the hospital. Neurology was consulted. Neuro think patient events are non organic. Please See Dr Lyman Speller note 4-16 regarding medications management. Psych was consulted recommend Depakote to help with mood. .    Seizure // Pseudoseizure  Appreciate neuro and Psych.  Psych agree with Depakote.  EEG ordered.  Having pseudo- seizures occasionally- he is known to do this in an attempt to manipulate staff. At times these are staring spell and at other times they are shaking or flailing of extremities. A sitter should be continued.  Keppra taper, started on Depakote. Follow Depakote level. See Dr Lyman Speller note. impacted left femoral neck fracture  S/p ORIF on 4/10  PT per ortho.  Coumadin for DVT prophylaxis.  Multiple psychiatric/mood disorders  Stable on PO meds  Mental handicap  HTN  Hold lisinopril, received contrast today.  PUD  Is on daily prilosec as outpt  On protonix here  Hypothyroid  Cont replacement tx  COPD  Stable ; cont scheduled nebs  tobacco abuse  Chest pain: Patient was complaining of chest pain, per nurse small amount of blood sputum. CT chest negative for PE. Will monitor closely for recurrent hemoptysis, if reocur will consider stop coumadin.  On Lovenox for DVT prophylaxis.  Patient need SNF.    LOS: 7 days   Shanisha Lech M.D.  [facility] 04/17/2011, 11:05 AM

## 2011-04-17 NOTE — Consult Note (Signed)
Subjective: No complaints.  Objective: Vital signs in last 24 hours: Temp:  [97.5 F (36.4 C)-98.6 F (37 C)] 98.6 F (37 C) (04/16 0600) Pulse Rate:  [78-92] 78  (04/16 0600) Resp:  [16-20] 20  (04/16 0600) BP: (108-120)/(65-76) 113/71 mmHg (04/16 0600) SpO2:  [90 %-95 %] 90 % (04/16 0600)  Intake/Output from previous day: 04/15 0701 - 04/16 0700 In: 906.3 [I.V.:788.8; IV Piggyback:117.5] Out: 2250 [Urine:2250]   Nutritional status: General  Neurological exam: AAO*3. No aphasia.  Was able to tell me months of the year forwards and backwards correctly, exhibiting good attention span. Recall was 3 of 3 after 5 minutes. Followed complex commands. Cranial nerves: EOMI, PERRL. Visual fields were full. Sensation to V1 through V3 areas of the face was intact and symmetric throughout. There was no facial asymmetry. Hearing to finger rub was equal and symmetrical bilaterally. Shoulder shrug was 5/5 and symmetric bilaterally. Head rotation was 5/5 bilaterally. There was no dysarthria or palatal deviation. Motor: strength was 5/5 and symmetric throughout. Sensory: was intact throughout to light touch, pinprick, vibration and proprioception. Coordination: finger-to-nose and heel-to-shin were intact and symmetric bilaterally. Reflexes: were 2+ in upper extremities and 1+ at the knees and 1+ at the ankles. Plantar response was downgoing bilaterally. Gait: deferred.  Lab Results:  Basename 04/17/11 0521 04/16/11 1450  WBC -- 3.8*  HGB -- 11.1*  HCT -- 35.6*  PLT -- 238  NA 140 136  K 4.3 4.3  CL 105 100  CO2 27 28  GLUCOSE 81 109*  BUN 19 18  CREATININE 0.86 0.89  CALCIUM 9.2 9.6  LABA1C -- --   Studies/Results: Dg Chest 2 View  04/16/2011  *RADIOLOGY REPORT*  Clinical Data: Chest pain, recent hip surgery, history hypertension, COPD  CHEST - 2 VIEW  Comparison: 04/11/2011  Findings: Normal heart size, mediastinal contours, and pulmonary vascularity. Emphysematous and bronchitic changes.  Left upper lobe scarring. Elevation left diaphragm, chronic. Minimal left basilar atelectasis. No acute infiltrate, pleural effusion or pneumothorax. No acute osseous findings. Broad-based dextroconvex thoracic scoliosis.  IMPRESSION: Emphysematous and chronic bronchitic changes with left upper lobe scarring and minimal left basilar atelectasis.  Original Report Authenticated By: Lollie Marrow, M.D.   Ct Angio Chest W/cm &/or Wo Cm  04/16/2011  *RADIOLOGY REPORT*  Clinical Data: Hemoptysis, chest pain, evaluate for PE  CT ANGIOGRAPHY CHEST  Technique:  Multidetector CT imaging of the chest using the standard protocol during bolus administration of intravenous contrast. Multiplanar reconstructed images including MIPs were obtained and reviewed to evaluate the vascular anatomy.  Contrast: OMNIPAQUE IOHEXOL 350 MG/ML SOLN  Comparison: Chest radiographs dated 04/16/2011.  [name] Long CT chest dated 03/18/2011.  Findings: No evidence of pulmonary embolism.  Again seen are scattered mild patchy opacities in the bilateral upper lobes and right lower lobe, likely reflecting scattered areas of scarring and/or atelectasis. Residual infection is possible but is considered less likely given lack of interval change.  Scarring at the bilateral lung bases.  Mild centrilobular and paraseptal emphysematous changes.  No pleural effusion or pneumothorax.  Visualized thyroid is unremarkable.  The heart is normal in size.  No pericardial effusion.  Coronary atherosclerosis.  Atherosclerotic calcifications of the aortic arch.  Stable mediastinal/right hilar lymphadenopathy, including: --1.6 cm prevascular node (series 4/image 49) --1.3 cm AP window node (series 4/image 37) --1.7 cm right hilar node (series 4/image 47)  Visualized upper abdomen is unremarkable.  Degenerative changes of the visualized thoracolumbar spine.  IMPRESSION: No evidence of  pulmonary embolism.  Stable scattered mild patchy opacities in the bilateral upper  lobes and right lower lobe, likely reflecting scattered areas of scarring and/or atelectasis. Residual infection is possible but considered less likely given lack of interval change from recent CT.  Underlying mild centrilobular and paraseptal emphysematous changes.  Stable mediastinal/right hilar lymphadenopathy, as described above.  Original Report Authenticated By: Charline Bills, M.D.   Medications: I have reviewed the patient's current medications.  Assessment/Plan: 56 years old man with questionable seizure-like episodes - I have evaluated the patient in the past and his events are clearly non-organic. I doubt that long term EEG will help resolve this matter as there is nobody in the room who can press the call button if an event occurs. I have reduced his keppra to 500 mg PO bid and it should be reduced to 250 mg PO bid in one week and then stopped in one more week. He should follow-up with Dr. Modesto Charon at [facility]. Close psychiatric follow-up is recommended.  If he is still here tomorrow, I recommend checking a Depakote level and calling out service for any further input should his level be subtherapeutic. Call with questions.    LOS: 7 days   [name]

## 2011-04-17 NOTE — Progress Notes (Signed)
Physical Therapy Treatment Patient Details Name: [name] MRN: [id number] DOB: March 17, 1955 Today's Date: 04/17/2011  PT Assessment/Plan  PT - Assessment/Plan Comments on Treatment Session: Pt continues to be impulsive & demonstrate decreased safety awareness but is easily redirected & follows safety cues well.  Pt progressing well with PT goals.  This clinician has observed Pt ambulating in halls with/without (A).  Encouraged pt to use RW at all times for increased safety & stability.   PT Plan: Discharge plan remains appropriate PT Frequency: Min 3X/week Follow Up Recommendations: Skilled nursing facility;Supervision - Intermittent Equipment Recommended: Defer to next venue PT Goals  Acute Rehab PT Goals PT Goal: Sit to Stand - Progress: Met PT Goal: Stand to Sit - Progress: Met PT Goal: Ambulate - Progress: Progressing toward goal (will ensure consistency with performance.  )  PT Treatment Precautions/Restrictions  Precautions Precautions: Fall Restrictions Weight Bearing Restrictions: Yes LLE Weight Bearing: Weight bearing as tolerated Mobility (including Balance) Bed Mobility Bed Mobility: No Transfers Transfers: Yes Sit to Stand: 5: Supervision;From chair/3-in-1;With upper extremity assist;With armrests Sit to Stand Details (indicate cue type and reason): Supervision for safety due to pt impulsive & standing without warning.   Stand to Sit: 6: Modified independent (Device/Increase time);To chair/3-in-1;With armrests;With upper extremity assist Ambulation/Gait Ambulation/Gait: Yes Ambulation/Gait Assistance: 5: Supervision Ambulation/Gait Assistance Details (indicate cue type and reason): Supervision for safety due to pt's impulsiveness & continues to demonstrate decreased safety awareness at times (picking RW up & carrying it).  Cues for safety, to slow down at times, & safe RW management.   Ambulation Distance (Feet): 200 Feet Assistive device: Rolling walker Gait  Pattern: Step-through pattern;Decreased hip/knee flexion - left Wheelchair Mobility Wheelchair Mobility: No  Posture/Postural Control Posture/Postural Control: No significant limitations Balance Balance Assessed: No Exercise  General Exercises - Lower Extremity Long Arc Quad: AROM;Strengthening;Both;10 reps;Seated Hip Flexion/Marching: AROM;Strengthening;Both;10 reps End of Session PT - End of Session Equipment Utilized During Treatment: Gait belt Activity Tolerance: Patient tolerated treatment well Patient left: in chair;with call bell in reach Nurse Communication: Mobility status for transfers;Mobility status for ambulation General Behavior During Session: [facility] for tasks performed Cognition: Impaired, at baseline  Lara Mulch 04/17/2011, 12:12 PM [phone]

## 2011-04-17 NOTE — Progress Notes (Signed)
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 Patient was unale to verbalize what medications he takes   Current Mental Health Hospitalizations/Previous Mental Health History:   None reported   Current provider:   PCP prescribes psychotropic medications per caregiver   Place and Date:   March 2013 was last appointment   Current Medications:         . albuterol  2.5 mg Nebulization TID  . amLODipine  10 mg Oral Daily  . budesonide  0.5 mg Nebulization BID  . divalproex  750 mg Oral Q8H  . fluticasone  2 puff Inhalation BID  . ipratropium  0.5 mg Nebulization TID  . levETIRAcetam  500 mg Oral BID  . levothyroxine  75 mcg Oral Daily  . lithium carbonate  300 mg Oral TID WC  . nicotine  14 mg Transdermal Daily  . pantoprazole  40 mg Oral Q1200  . polyethylene glycol  17 g Oral Daily  . risperiDONE  1 mg Oral Daily  . risperiDONE  2 mg Oral Q2000  . Tamsulosin HCl  0.4 mg Oral Daily  . traZODone  100 mg Oral QHS  . valproate sodium  1,000 mg Intravenous Once  . warfarin  10 mg Oral ONCE-1800  . warfarin  5 mg Oral ONCE-1800  . warfarin   Does not apply Once  . Warfarin - Pharmacist Dosing Inpatient   Does not apply q1800  . DISCONTD: enoxaparin  40 mg  Subcutaneous Q24H  . DISCONTD: levETIRAcetam  750 mg Oral BID  . DISCONTD: lisinopril  10 mg Oral Daily  . DISCONTD: valproate sodium  750 mg Intravenous Once  . DISCONTD: valproate sodium  750 mg Intravenous Q8H  . DISCONTD: warfarin  7.5 mg Oral ONCE-1800   Previous Impatient Admission/Date/Reason:   None reported by patient or caregiver   Emotional Health / Current Symptoms    Suicide/Self Harm  None reported   Suicide attempt in the past:   Other harmful behavior:   Psychotic/Dissociative Symptoms  None reported   Other Psychotic/Dissociative Symptoms:    Attention/Behavioral Symptoms  Inattentive  Restless   Other Attention / Behavioral Symptoms:    Cognitive Impairment  Mental Retardation - Mild   Other Cognitive Impairment:    Mood and Adjustment  Flat     Anxiety (frequency):   Phobia (specify):   Compulsive behavior (specify):   Obsessive behavior (specify):   Other:   Substance Abuse/Use  None   SBIRT completed (please refer for detailed history):  N  Self-reported substance use:   Patient reports no substance use   Urinary Drug Screen Completed:  N Alcohol level:    Environmental/Housing/Living Arrangement  Assisted Living / Group Home   Who is in the home:   Caregiver-Ryan   Emergency contact:  Ryan-caregiver and [employer]  Medicaid   Patient's Strengths and Goals (patient's own words):   Patient was pleasant and cooperative during assessment.   Clinical Social Worker's Interpretive Summary:   Patient was guarded during assessment and avoided eye contact. Patient answered questions but reported he did not know several of the answers that CSW asked. Patient wished to talk about his group home and rehab. Patient reported he likes his life and wants to go back to the group home. Patient reports he is not having any medical problems but reports that his leg is hurt from falling. Patient reports he has never had  any medical problems. 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 CSW called and left a message for guardian Fredia Sorrow) but still has not received a return phone call. CSW is signing off but is available if further needs arise. Per chart review, patient will go to SNF before returning home. Patient is aware of this plan and agreeable.   Disposition:  Psych Clinical Social Worker signing off

## 2011-04-17 NOTE — Progress Notes (Addendum)
At 1350, pt started too slowly roll his eyes, and began staring to the left.  He would not respond to questions.  Two minutes later, he began to talk.  Then about a minute later, he started to stare to the left again, and would sling both arms outward.  He would not respond to any questions.  He breathing became shallow, though his vitals were stable.  bp 125/82, hr 66, 96% on room air, respirations at 18 breaths/minute.  For 55 minutes he was staring to left and  nonpurposefully sling his arms outward.  His skin also became cold and his lips was becoming a bluish color during this 55 minute period.  Prior to and after this activity, he was spitting up large handfuls blood tinged mucous.  Nsg to continue to monitor.   Dr. Lyman Speller and Dr. [name] updated of patient's status, orders received.

## 2011-04-17 NOTE — Progress Notes (Signed)
ANTICOAGULATION CONSULT NOTE - Follow Up Consult  Pharmacy Consult for Coumadin Indication: VTE prophylaxis s/p hip surgery  Allergies  Allergen Reactions  . Peanut-Containing Drug Products Anaphylaxis  . Prolixin (Fluphenazine Hcl) Other (See Comments)    "muscle cramps"  . Thorazine (Chlorpromazine Hcl) Other (See Comments)    "burns"  . Lactose Intolerance (Gi) Rash    Patient Measurements: Height: 5\' 10"  (177.8 cm) Weight: 155 lb 13.8 oz (70.7 kg) IBW/kg (Calculated) : 73   Vital Signs: Temp: 98.6 F (37 C) (04/16 0600) BP: 113/71 mmHg (04/16 0600) Pulse Rate: 78  (04/16 0600)  Labs:  Basename 04/17/11 0521 04/16/11 1450 04/16/11 1015 04/16/11 0742 04/15/11 0500 04/14/11 1541  HGB -- 11.1* -- -- -- --  HCT -- 35.6* -- -- -- --  PLT -- 238 -- -- -- --  APTT -- -- -- -- -- --  LABPROT 20.9* -- -- 15.9* 14.1 --  INR 1.77* -- -- 1.24 1.07 --  HEPARINUNFRC -- -- -- -- -- --  CREATININE 0.86 0.89 -- -- -- 1.02  CKTOTAL -- -- 71 -- -- --  CKMB -- -- 1.4 -- -- --  TROPONINI -- -- <0.30 -- -- --   Estimated Creatinine Clearance: 94.8 ml/min (by C-G formula based on Cr of 0.86).   Assessment: 56 yo M POD#6 s/p left hip surgery. On Coumadin for VTE prophylaxis x 30 days with lower goal INR of 1.5-2 per MD . Today INR =1.77. Large 5 second jump in INR. Will stop lovenox 40mg  sq q24h now that INR >1.5.  No bleeding noted.  Goal of Therapy:  INR 1.5-2.5   Plan:  1.  Coumadin 5mg  po x 1 tonight  2.  F/U AM INR 3. Stop LMWH 40 mg qday since INR > 1.5  Herby Abraham, Pharm.D. [phone] 04/17/2011 10:21 AM

## 2011-04-17 NOTE — Progress Notes (Signed)
After pt's dinner tray arrived, pt stated that he could not swallow.  He said he could not swallow fluid or food.  He is refusing to take his po meds.  Dr. [name] notified, who said to try to give [name] his meds later on this evening.

## 2011-04-17 NOTE — Progress Notes (Signed)
PATIENT ID: MAXEMILIANO RIEL  MRN: [id number]  DOB/AGE:  Aug 05, 1955 / 56 y.o.  6 Days Post-Op Procedure(s) (LRB): CANNULATED HIP PINNING (Left)  Subjective: Pain is none.  No c/o chest pain or SOB.   Still with seizure/pseudoseizure activity.     Objective: Vital signs in last 24 hours: Temp:  [97.5 F (36.4 C)-98.6 F (37 C)] 98.6 F (37 C) (04/16 0600) Pulse Rate:  [78-92] 78  (04/16 0600) Resp:  [16-20] 20  (04/16 0600) BP: (108-120)/(65-76) 113/71 mmHg (04/16 0600) SpO2:  [90 %-95 %] 90 % (04/16 0600)  Intake/Output from previous day: 04/15 0701 - 04/16 0700 In: 906.3 [I.V.:788.8; IV Piggyback:117.5] Out: 2250 [Urine:2250] Intake/Output this shift:     Basename 04/16/11 1450  HGB 11.1*    Basename 04/16/11 1450  WBC 3.8*  RBC 4.25  HCT 35.6*  PLT 238    Basename 04/17/11 0521 04/16/11 1450  NA 140 136  K 4.3 4.3  CL 105 100  CO2 27 28  BUN 19 18  CREATININE 0.86 0.89  GLUCOSE 81 109*  CALCIUM 9.2 9.6    Basename 04/17/11 0521 04/16/11 0742  LABPT -- --  INR 1.77* 1.24    Physical Exam: Neurologically intact Intact pulses distally Dorsiflexion/Plantar flexion intact Incision: no drainage  Assessment/Plan: 6 Days Post-Op Procedure(s) (LRB): CANNULATED HIP PINNING (Left)   Up with therapy Weight Bearing as Tolerated (WBAT)  VTE prophylaxis: pharmacologic prophylaxis (with any of the following: warfarin adjusted-dose) Seizure/pych mgmnt per primary team.  Ok for d/c from ortho standpoint when medically ready.   Mable Paris 04/17/2011, 7:48 AM

## 2011-04-17 NOTE — Progress Notes (Addendum)
Clinical Social Worker received phone call from pt legal guardian [name]) in regard to pt SNF placement. Pt legal guardian discussed that she researched [facility] and Rehab and does not want pt to go to that facility. Clinical Social Worker discussed other bed offers and that this Clinical Social Worker had contacted [facility] and facility liaison is going to review information and let this Clinical Social Worker know decision from facility. Clinical Social Worker to follow up with facility and pt legal guardian. Clinical Child psychotherapist discussed with pt legal guardian about expanding the SNF search and pt legal guardian would like to wait for decision from [facility] before exploring SNF placement in other counties. Pt legal guardian prefers pt to be in [place] as pt legal guardian and pt caregiver are pt primary support system. Clinical Social Worker to continue to follow.  Addendum 12:15pm: Clinical Child psychotherapist received notification from [facility] that facility is able to offer a bed. Clinical Social Worker contacted pt legal guardian [name]) to inform of bed offer from [facility] and pt legal guardian accepted bed offer. Clinical Social Worker notified [employer]. Per MD, pt not yet medically ready for discharge. Clinical Social Worker to facilitate pt discharge needs when pt medically stable.   [name], MSW, LCSWA  Clinical Social Work [phone]

## 2011-04-18 ENCOUNTER — Institutional Professional Consult (permissible substitution): Payer: Self-pay | Admitting: Pulmonary Disease

## 2011-04-18 ENCOUNTER — Inpatient Hospital Stay (HOSPITAL_COMMUNITY): Payer: Medicare Other

## 2011-04-18 MED ORDER — LORAZEPAM 1 MG PO TABS
1.0000 mg | ORAL_TABLET | ORAL | Status: DC | PRN
  Administered 2011-04-18 (×2): 1 mg via ORAL
  Filled 2011-04-18 (×2): qty 1

## 2011-04-18 NOTE — Progress Notes (Signed)
Subjective: Patient refused to speak to me Told nurse that he wanted to hurt  No further episodes of hemoptysis Refused to eat breakfast but took pills and drank coffee- swallowing fine per nursing   Objective: Vital signs in last 24 hours: Filed Vitals:   04/17/11 0600 04/17/11 1337 04/17/11 2100 04/18/11 0637  BP: 113/71 132/77 129/76 125/71  Pulse: 78 81 90 110  Temp: 98.6 F (37 C) 97.3 F (36.3 C) 98.2 F (36.8 C) 97.5 F (36.4 C)  TempSrc:   Oral Oral  Resp: 20 20 20 18   Height:      Weight:      SpO2: 90% 96% 94% 91%   Weight change:   Intake/Output Summary (Last 24 hours) at 04/18/11 1036 Last data filed at 04/18/11 0659  Gross per 24 hour  Intake    480 ml  Output    550 ml  Net    -70 ml    Physical Exam: General: Awake, No acute distress, laying in bed HEENT: EOMI. Neck: Supple CV: S1 and S2, rrr Lungs: Clear to ascultation bilaterally, no wheezing Abdomen: Soft, Nontender, Nondistended, +bowel sounds. Ext: Good pulses. Trace edema.   Lab Results:  [facility] 04/17/11 0521 04/16/11 1450  NA 140 136  K 4.3 4.3  CL 105 100  CO2 27 28  GLUCOSE 81 109*  BUN 19 18  CREATININE 0.86 0.89  CALCIUM 9.2 9.6  MG -- --  PHOS -- --   No results found for this basename: AST:2,ALT:2,ALKPHOS:2,BILITOT:2,PROT:2,ALBUMIN:2 in the last 72 hours No results found for this basename: LIPASE:2,AMYLASE:2 in the last 72 hours  Basename 04/17/11 1701 04/16/11 1450  WBC 4.5 3.8*  NEUTROABS -- --  HGB 11.0* 11.1*  HCT 34.7* 35.6*  MCV 82.2 83.8  PLT 239 238    Basename 04/16/11 1015  CKTOTAL 71  CKMB 1.4  CKMBINDEX --  TROPONINI <0.30   No components found with this basename: POCBNP:3 No results found for this basename: DDIMER:2 in the last 72 hours No results found for this basename: HGBA1C:2 in the last 72 hours No results found for this basename: CHOL:2,HDL:2,LDLCALC:2,TRIG:2,CHOLHDL:2,LDLDIRECT:2 in the last 72 hours No results found for this basename:  TSH,T4TOTAL,FREET3,T3FREE,THYROIDAB in the last 72 hours No results found for this basename: VITAMINB12:2,FOLATE:2,FERRITIN:2,TIBC:2,IRON:2,RETICCTPCT:2 in the last 72 hours  Micro Results: No results found for this or any previous visit (from the past 240 hour(s)).  Studies/Results: Dg Chest 2 View  04/18/2011  *RADIOLOGY REPORT*  Clinical Data: Hemoptysis, confusion  CHEST - 2 VIEW  Comparison: Chest x-ray of 04/16/2011 and CT chest of 04/16/2011  Findings: The lungs are hyperaerated consistent with COPD.  No focal infiltrate or effusion is seen.  Mediastinal contours appear stable.  The heart is within normal limits in size.  No bony abnormality is seen.  IMPRESSION: COPD.  No active lung disease.  Original Report Authenticated By: Juline Patch, M.D.   Dg Chest 2 View  04/16/2011  *RADIOLOGY REPORT*  Clinical Data: Chest pain, recent hip surgery, history hypertension, COPD  CHEST - 2 VIEW  Comparison: 04/11/2011  Findings: Normal heart size, mediastinal contours, and pulmonary vascularity. Emphysematous and bronchitic changes. Left upper lobe scarring. Elevation left diaphragm, chronic. Minimal left basilar atelectasis. No acute infiltrate, pleural effusion or pneumothorax. No acute osseous findings. Broad-based dextroconvex thoracic scoliosis.  IMPRESSION: Emphysematous and chronic bronchitic changes with left upper lobe scarring and minimal left basilar atelectasis.  Original Report Authenticated By: Lollie Marrow, M.D.   Ct Angio Chest  W/cm &/or Wo Cm  04/16/2011  *RADIOLOGY REPORT*  Clinical Data: Hemoptysis, chest pain, evaluate for PE  CT ANGIOGRAPHY CHEST  Technique:  Multidetector CT imaging of the chest using the standard protocol during bolus administration of intravenous contrast. Multiplanar reconstructed images including MIPs were obtained and reviewed to evaluate the vascular anatomy.  Contrast: OMNIPAQUE IOHEXOL 350 MG/ML SOLN  Comparison: Chest radiographs dated 04/16/2011.   [name] Long CT chest dated 03/18/2011.  Findings: No evidence of pulmonary embolism.  Again seen are scattered mild patchy opacities in the bilateral upper lobes and right lower lobe, likely reflecting scattered areas of scarring and/or atelectasis. Residual infection is possible but is considered less likely given lack of interval change.  Scarring at the bilateral lung bases.  Mild centrilobular and paraseptal emphysematous changes.  No pleural effusion or pneumothorax.  Visualized thyroid is unremarkable.  The heart is normal in size.  No pericardial effusion.  Coronary atherosclerosis.  Atherosclerotic calcifications of the aortic arch.  Stable mediastinal/right hilar lymphadenopathy, including: --1.6 cm prevascular node (series 4/image 49) --1.3 cm AP window node (series 4/image 37) --1.7 cm right hilar node (series 4/image 47)  Visualized upper abdomen is unremarkable.  Degenerative changes of the visualized thoracolumbar spine.  IMPRESSION: No evidence of pulmonary embolism.  Stable scattered mild patchy opacities in the bilateral upper lobes and right lower lobe, likely reflecting scattered areas of scarring and/or atelectasis. Residual infection is possible but considered less likely given lack of interval change from recent CT.  Underlying mild centrilobular and paraseptal emphysematous changes.  Stable mediastinal/right hilar lymphadenopathy, as described above.  Original Report Authenticated By: Charline Bills, M.D.    Medications: I have reviewed the patient's current medications. Scheduled Meds:   . albuterol  2.5 mg Nebulization TID  . amLODipine  10 mg Oral Daily  . budesonide  0.5 mg Nebulization BID  . divalproex  750 mg Oral Q8H  . fluticasone  2 puff Inhalation BID  . ipratropium  0.5 mg Nebulization TID  . levETIRAcetam  500 mg Oral BID  . levothyroxine  75 mcg Oral Daily  . lithium carbonate  300 mg Oral TID WC  . nicotine  14 mg Transdermal Daily  . pantoprazole  40 mg Oral  Q1200  . polyethylene glycol  17 g Oral Daily  . risperiDONE  1 mg Oral Daily  . risperiDONE  2 mg Oral Q2000  . Tamsulosin HCl  0.4 mg Oral Daily  . traZODone  100 mg Oral QHS  . Warfarin - Pharmacist Dosing Inpatient   Does not apply q1800  . DISCONTD: warfarin  5 mg Oral ONCE-1800  . DISCONTD: warfarin   Does not apply Once   Continuous Infusions:   . DISCONTD: sodium chloride 75 mL/hr at 04/17/11 0838   PRN Meds:.acetaminophen, acetaminophen, albuterol-ipratropium, alum & mag hydroxide-simeth, diphenhydrAMINE, haloperidol lactate, HYDROcodone-acetaminophen, LORazepam, morphine, ondansetron (ZOFRAN) IV, ondansetron, oxyCODONE, phenol, DISCONTD: LORazepam  Assessment/Plan: 56 year old with PMH MR, seizure, pseudoseizure, admitted after mechanical fall found to have left femoral neck fracture. Patient had surgery 04-11-2011. Patient has had episodes of ? Pseudoseizure while in the hospital. Neurology was consulted. Neuro think patient events are non organic. Please See Dr Lyman Speller note 4-16 regarding medications management. Psych was consulted recommend Depakote to help with mood. .  Suicidal ideations: Has sitter Re-notify psych of threat   Seizure // Pseudoseizure  Appreciate neuro and Psych.  Psych agree with Depakote.  EEG ordered.  Having pseudo- seizures occasionally- he is known  to do this in an attempt to manipulate staff. At times these are staring spell and at other times they are shaking or flailing of extremities. A sitter should be continued.  Keppra taper, started on Depakote. Follow Depakote level. See Dr Lyman Speller note.   impacted left femoral neck fracture  S/p ORIF on 4/10  PT per ortho.  Coumadin for DVT prophylaxis- plan to restart today as  Multiple psychiatric/mood disorders  Stable on PO meds   Mental handicap   HTN  Hold lisinopril, received contrast today.   PUD  Is on daily prilosec as outpt  On protonix here   Hypothyroid  Cont replacement tx    COPD  Stable ; cont scheduled nebs   tobacco abuse   Chest pain: Patient was complaining of chest pain, per nurse small amount of blood sputum. CT chest negative for PE.  Reported hemoptysis no further episodes, resume coumadin, PE negative    Patient need SNF- [facility] for D/C to[facility]     LOS: 8 days  Alaria Oconnor, DO 04/18/2011, 10:36 AM

## 2011-04-18 NOTE — Discharge Summary (Addendum)
Discharge Summary  [name] MR#: [id number]  DOB:September 07, 1955  Date of Admission: 04/10/2011 Date of Discharge: 04/19/2011  Patient's PCP: Henri Medal, MD, MD  Attending Physician:Kersten Salmons  Consults: Treatment Team:  Mable Paris, MD   Discharge Diagnoses: Principal Problem:  *Hip fracture, left Active Problems:  COPD with acute exacerbation  Schizoaffective disorder  Mental retardation, mild (I.Q. 50-70)  Hypothyroidism  BPH (benign prostatic hyperplasia)  Seizure-like activity  HTN (hypertension)  Tobacco use   Brief Admitting History and Physical This is a 56 year old male with known history of seizures, schizoaffective disorder, organic mood disorder, intermittent explosive disorder, seizure disorder and mild mental retardation.he states he was coming from the bathroom when he stumbled and fell last Wednesday approximately 6 days ago. He states he's been walking since but very badly. He's had persistent and worsening pain in the left hip. For this reason he finally came to the ER today. Patient states he does not use a walker and he seldom falls. History provider the patient is alert and oriented.   Discharge Medications Medication List  As of 04/19/2011  8:22 AM   STOP taking these medications         lisinopril 5 MG tablet      potassium chloride 20 MEQ packet         TAKE these medications         albuterol-ipratropium 18-103 MCG/ACT inhaler   Commonly known as: COMBIVENT   Inhale 2 puffs into the lungs every 6 (six) hours as needed. Shortness of breath.      amLODipine 10 MG tablet   Commonly known as: NORVASC   Take 10 mg by mouth daily.      diphenhydrAMINE 50 MG capsule   Commonly known as: BENADRYL   Take 50 mg by mouth at bedtime as needed. For insomnia      divalproex 125 MG capsule   Commonly known as: DEPAKOTE SPRINKLE   Take 6 capsules (750 mg total) by mouth every 8 (eight) hours.      docusate sodium 100 MG capsule    Commonly known as: COLACE   Take 100 mg by mouth 2 (two) times daily.      fluticasone 44 MCG/ACT inhaler   Commonly known as: FLOVENT HFA   Inhale 2 puffs into the lungs 2 (two) times daily.      haloperidol 2 MG tablet   Commonly known as: HALDOL   Take 1 tablet (2 mg total) by mouth every 6 (six) hours as needed (aggitation). agitation      HYDROcodone-acetaminophen 5-325 MG per tablet   Commonly known as: NORCO   Take 1-2 tablets by mouth every 4 (four) hours as needed.      levETIRAcetam 500 MG tablet   Commonly known as: KEPPRA   Take 1 tablet (500 mg total) by mouth 2 (two) times daily.      levothyroxine 75 MCG tablet   Commonly known as: SYNTHROID, LEVOTHROID   Take 75 mcg by mouth daily.      lithium carbonate 300 MG capsule   Take 300 mg by mouth 3 (three) times daily with meals.      omeprazole 20 MG capsule   Commonly known as: PRILOSEC   Take 20 mg by mouth 2 (two) times daily.      risperiDONE 2 MG tablet   Commonly known as: RISPERDAL   Take 2 mg by mouth daily at 8 pm.      risperiDONE 1  MG tablet   Commonly known as: RISPERDAL   Take 1 mg by mouth daily.      Tamsulosin HCl 0.4 MG Caps   Commonly known as: FLOMAX   Take by mouth.      traZODone 100 MG tablet   Commonly known as: DESYREL   Take 100 mg by mouth at bedtime.            Hospital Course: Seizure // Pseudoseizure  Appreciate neuro and Psych.  Psych agree with Depakote.  EEG ordered.  Having pseudo- seizures occasionally- he is known to do this in an attempt to manipulate staff. At times these are staring spell and at other times they are shaking or flailing of extremities. A sitter should be continued.  Keppra taper, started on Depakote.    impacted left femoral neck fracture  S/p ORIF on 4/10  PT per ortho.  Coumadin for DVT prophylaxis  Multiple psychiatric/mood disorders  Stable on PO meds   Mental handicap   HTN  Continue home medications  PUD  Is on daily  prilosec as outpt  On protonix here   Hypothyroid  Cont replacement tx   COPD  Stable ; cont scheduled nebs   tobacco abuse   Chest pain: Patient was complaining of chest pain, per nurse small amount of blood sputum. CT chest negative for PE.  Reported hemoptysis no further episodes, resume coumadin, PE negative  Day of Discharge BP 114/68  Pulse 99  Temp(Src) 97.8 F (36.6 C) (Oral)  Resp 16  Ht 5\' 10"  (1.778 m)  Wt 70.7 kg (155 lb 13.8 oz)  BMI 22.36 kg/m2  SpO2 91% Clear ant RRR +BS, soft NT  Results for orders placed during the hospital encounter of 04/10/11 (from the past 48 hour(s))  CBC     Status: Abnormal   Collection Time   04/17/11  5:01 PM      Component Value Range Comment   WBC 4.5  4.0 - 10.5 (K/uL)    RBC 4.22  4.22 - 5.81 (MIL/uL)    Hemoglobin 11.0 (*) 13.0 - 17.0 (g/dL)    HCT 16.1 (*) 09.6 - 52.0 (%)    MCV 82.2  78.0 - 100.0 (fL)    MCH 26.1  26.0 - 34.0 (pg)    MCHC 31.7  30.0 - 36.0 (g/dL)    RDW 04.5  40.9 - 81.1 (%)    Platelets 239  150 - 400 (K/uL)   PROTIME-INR     Status: Abnormal   Collection Time   04/18/11  5:40 AM      Component Value Range Comment   Prothrombin Time 25.2 (*) 11.6 - 15.2 (seconds)    INR 2.24 (*) 0.00 - 1.49    VALPROIC ACID LEVEL     Status: Abnormal   Collection Time   04/18/11  5:40 AM      Component Value Range Comment   Valproic Acid Lvl 31.0 (*) 50.0 - 100.0 (ug/mL)     Dg Chest 2 View  04/18/2011  *RADIOLOGY REPORT*  Clinical Data: Hemoptysis, confusion  CHEST - 2 VIEW  Comparison: Chest x-ray of 04/16/2011 and CT chest of 04/16/2011  Findings: The lungs are hyperaerated consistent with COPD.  No focal infiltrate or effusion is seen.  Mediastinal contours appear stable.  The heart is within normal limits in size.  No bony abnormality is seen.  IMPRESSION: COPD.  No active lung disease.  Original Report Authenticated By: Juline Patch, M.D.  Dg Chest 2 View  04/16/2011  *RADIOLOGY REPORT*  Clinical Data:  Chest pain, recent hip surgery, history hypertension, COPD  CHEST - 2 VIEW  Comparison: 04/11/2011  Findings: Normal heart size, mediastinal contours, and pulmonary vascularity. Emphysematous and bronchitic changes. Left upper lobe scarring. Elevation left diaphragm, chronic. Minimal left basilar atelectasis. No acute infiltrate, pleural effusion or pneumothorax. No acute osseous findings. Broad-based dextroconvex thoracic scoliosis.  IMPRESSION: Emphysematous and chronic bronchitic changes with left upper lobe scarring and minimal left basilar atelectasis.  Original Report Authenticated By: Lollie Marrow, M.D.   Dg Chest 2 View  04/11/2011  *RADIOLOGY REPORT*  Clinical Data: Left-sided chest pain and cough.  CHEST - 2 VIEW  Comparison: Chest radiograph and CTA of the chest performed 03/18/2011  Findings: The lungs are hyperexpanded, with flattening of the hemidiaphragms, compatible with COPD.  Chronic peribronchial thickening is noted.  The distribution of scarring and atelectasis is grossly unchanged.  There is no evidence of focal opacification, pleural effusion or pneumothorax.  The heart is normal in size; the mediastinal contour is within normal limits. The known enlarged right hilar node is not well characterized on radiograph.  No acute osseous abnormalities are seen.  IMPRESSION:  1.  No acute cardiopulmonary process seen. 2.  Findings of COPD; chronic lung changes seen.  Original Report Authenticated By: Tonia Ghent, M.D.   Dg Hip Complete Left  04/12/2011  *RADIOLOGY REPORT*  Clinical Data: Left subcapital fracture.  LEFT HIP - COMPLETE 2+ VIEW  Comparison: For.  2013 CT.  Findings:  Two intraoperative C-arm views submitted for review after surgery. This reveals three screws transfixing left subcapital fracture. Screws are contained within the femoral head.  Impression: Open reduction and internal fixation left subcapital fracture.  Original Report Authenticated By: Fuller Canada, M.D.   Dg  Hip Complete Left  04/10/2011  *RADIOLOGY REPORT*  Clinical Data: Fall and left groin pain.  LEFT HIP - COMPLETE 2+ VIEW  Comparison: None.  Findings: AP view of the pelvis and two views of the left hip were obtained.  There appears to be foreshortening of the left femoral neck and concern for a subcapital fracture.  There is sclerosis and irregularity of the left femoral neck.  Left hip is located.  The pelvic bony ring is intact.  No gross abnormality to the right hip. Large amount of stool in the right abdomen and pelvis.  IMPRESSION: Concern for a left femoral neck subcapital fracture. This finding could be confirmed with CT.  Original Report Authenticated By: Richarda Overlie, M.D.   Ct Angio Chest W/cm &/or Wo Cm  04/16/2011  *RADIOLOGY REPORT*  Clinical Data: Hemoptysis, chest pain, evaluate for PE  CT ANGIOGRAPHY CHEST  Technique:  Multidetector CT imaging of the chest using the standard protocol during bolus administration of intravenous contrast. Multiplanar reconstructed images including MIPs were obtained and reviewed to evaluate the vascular anatomy.  Contrast: OMNIPAQUE IOHEXOL 350 MG/ML SOLN  Comparison: Chest radiographs dated 04/16/2011.  [name] Long CT chest dated 03/18/2011.  Findings: No evidence of pulmonary embolism.  Again seen are scattered mild patchy opacities in the bilateral upper lobes and right lower lobe, likely reflecting scattered areas of scarring and/or atelectasis. Residual infection is possible but is considered less likely given lack of interval change.  Scarring at the bilateral lung bases.  Mild centrilobular and paraseptal emphysematous changes.  No pleural effusion or pneumothorax.  Visualized thyroid is unremarkable.  The heart is normal in size.  No pericardial  effusion.  Coronary atherosclerosis.  Atherosclerotic calcifications of the aortic arch.  Stable mediastinal/right hilar lymphadenopathy, including: --1.6 cm prevascular node (series 4/image 49) --1.3 cm AP window  node (series 4/image 37) --1.7 cm right hilar node (series 4/image 47)  Visualized upper abdomen is unremarkable.  Degenerative changes of the visualized thoracolumbar spine.  IMPRESSION: No evidence of pulmonary embolism.  Stable scattered mild patchy opacities in the bilateral upper lobes and right lower lobe, likely reflecting scattered areas of scarring and/or atelectasis. Residual infection is possible but considered less likely given lack of interval change from recent CT.  Underlying mild centrilobular and paraseptal emphysematous changes.  Stable mediastinal/right hilar lymphadenopathy, as described above.  Original Report Authenticated By: Charline Bills, M.D.   Ct Hip Left Wo Contrast  04/11/2011  *RADIOLOGY REPORT*  Clinical Data: Status post fall; evaluate left hip fracture.  CT OF THE LEFT HIP WITHOUT CONTRAST  Technique:  Multidetector CT imaging was performed according to the standard protocol. Multiplanar CT image reconstructions were also generated.  Comparison: Left hip radiographs performed 04/10/2011  Findings: There is a slightly comminuted subcapital fracture through the left femoral neck, with mild associated impaction.  The left femoral head remains seated at the acetabulum.  No additional fractures are identified.  An associated small left hip joint effusion is seen.  The left superior and inferior pubic rami remain intact.  The left acetabulum is within normal limits.  A 2.0 cm sclerotic focus within the left ilium adjacent to the sacroiliac joint likely reflects a large bone island, as does a 1.4 cm sclerotic focus at the right sacrum.  The left sacroiliac joint is unremarkable in appearance.  Mild scattered vascular calcifications are seen.  The visualized small and large bowel loops are grossly unremarkable.  The prostate appears mildly enlarged, though not well assessed.  No significant abnormalities are seen with respect to the visualized musculature.  IMPRESSION:  1.  Slightly  comminuted subcapital fracture through the left femoral neck, with mild associated impaction.  No evidence of dislocation. 2.  Associated small left hip joint effusion seen. 3.  Sclerotic foci within the left ilium and right sacrum, measuring up to 2.0 cm, likely reflect bone islands. 4.  Mild scattered vascular calcifications seen. 5.  Mildly enlarged prostate.  Original Report Authenticated By: Tonia Ghent, M.D.     Disposition: home with 24 hour care and home health  Diet: regular  Activity: as tolerated   Follow-up Appts: Discharge Orders    Future Orders Please Complete By Expires   Diet - low sodium heart healthy      Increase activity slowly      Discharge instructions      Comments:   D/c with caregiver and home health [facility] [address] Filter coumadin protocol   Discharge instructions      Comments:   Pt/INR on Friday AM- the per Dr. Veda Canning protocol      TESTS THAT NEED FOLLOW-UP PT/INR Friday then coumadin per advanced home care protocol- day #8/30  Time spent on discharge, talking to the patient, and coordinating care: 45 mins.   SignedMarlin Canary, DO 04/19/2011, 8:22 AM

## 2011-04-18 NOTE — Discharge Instructions (Signed)
WBAT on Left with walker. Needs daily PT.  Use Dr. Veda Canning protocol for PT/INR/coumdain

## 2011-04-18 NOTE — Progress Notes (Addendum)
Pt reported "I'm gonna commit suicide" to this RN.  When asked how, pt states and demonstrates with his hand that he will cut his wrists.  Dr. Benjamine Mola and Dr. Ferol Luz notified.  Flatware removed from room.  1:1 sitter at bedside and video monitoring in place.   Await Dr. Ferol Luz for assessment.   Continue to monitor closely.

## 2011-04-18 NOTE — Progress Notes (Signed)
Clinical Social Work Progress Note PSYCHIATRY SERVICE LINE 04/18/2011  Patient:  [name]  Account:  [id number]  Admit Date:  04/10/2011  Clinical Social Worker:  Unk Lightning, LCSW  Date/Time:  04/18/2011 01:00 PM  Review of Patient  Overall Medical Condition:   Patient reported to RN that he wanted to "commit suicide" and when she asked him why he made the motion of cutting his wrists.   Participation Level:  Minimal  Participation Quality  Other - See comment   Other Participation Quality:   Impulsive   Affect  Excited   Cognitive  Alert   Reaction to Medications/Concerns:   Patient refused all medications last night. Patient took medications this AM   Modes of Intervention  Support   Summary of Progress/Plan at Discharge   CSW received call that patient was suicidal and placed on SI precautions. CSW called caregiver Alycia Rossetti) who reported that patient has never had SI in the past, has never had any attempts and this behavior has not occurred since Jan. 2013. CSW spoke with guardian who reported that this is typical behavior for patient. Guardian reported that even when patient was younger he would make suicidal statements when he was upset. Guardian believes that patient is upset about going to a SNF. Per RN, behaviors started after RN told patient he would go to SNF. Guardian stated "so typical of everytime he is in the hospital. He loves the attention there. But he has never, ever tried to hurt anyone or himself." Per guardian, patient goes to [facility] once every 2 weeks and sees Dr. Grant Fontana. Guardian prefers that patient return home with caregiver with [facility]. Guardian, patient, caregiver, PT and psychiatrist are all agreeable to this plan. CSW met with patient with psychiatrist. After psychiatrist told patient he could go home, patient reported no SI. CSW will continue to follow patient.

## 2011-04-18 NOTE — Progress Notes (Signed)
04/18/2011 Mairely Foxworth [facility] Case Management Note [phone]  Utilization review completed.  

## 2011-04-18 NOTE — Progress Notes (Signed)
Clinical Social Worker continuing to follow for discharge planning. Clinical Social Worker received notification from covering psych Clinical Social Worker, Unk Lightning that pt disposition plan has changed to pt returning to back to group home with HHPT and OT. Per Clinical Social Worker Unk Lightning, pt legal guardian and caregiver agreeable. Case Manager notified. Clinical Social Worker spoke with pt caregiver in regard to pt return and pt caregiver can accept pt back on 04/19/2011 and can provide pt transportation. Clinical Social Worker to contact pt caregiver in the morning to facilitate pt discharge back to group home.   Jacklynn Lewis, MSW, LCSWA  Clinical Social Work [phone]

## 2011-04-18 NOTE — Progress Notes (Signed)
   CARE MANAGEMENT NOTE 04/18/2011  Patient:  [name], [name]   Account Number:  [id number]  Date Initiated:  04/18/2011  Documentation initiated by:  [facility]  Subjective/Objective Assessment:   Admitted with fractured left hip. From group home.     Action/Plan:   Anticipated DC Date:  04/19/2011   Anticipated DC Plan:  HOME W HOME HEALTH SERVICES  In-house referral  Clinical Social Worker      DC Planning Services  CM consult      Choice offered to / List presented to:  C-2 HC POA / Guardian        HH arranged  HH-3 OT  HH-2 PT  HH-1 RN      [facility] agency  [facility]   Status of service:  In process, will continue to follow Medicare Important Message given?   (If response is "NO", the following Medicare IM given date fields will be blank) Date Medicare IM given:   Date Additional Medicare IM given:    Discharge Disposition:    Per UR Regulation:    If discussed at Long Length of Stay Meetings, dates discussed:   04/18/2011    Comments:  PCP Jackalyn Lombard  Legal Ancil Linsey [phone] Caregiver-Ryan Benton [phone]  04/18/11 Patient not responding well to plan for d/c to SNF.D/c plan changed to return to group home with care giver and HHPT and OT. Contacted patient's legal guardian Doyce Para to discuss d/c plans. Ms Elwyn Reach had spoken with CSW Jacklynn Lewis and CSW Unk Lightning and is in agreement with patient returning to the group home with his care giver and HHPT and HHOT. Ms Elwyn Reach chose [facility] from the [facility] list of agencies.Contacted Vignesh Willert at [facility] and requested HHPT,HHOT and [facility], awaiting orders. Mickel Baas contact numbers for Ms Elwyn Reach and Mr Eliseo Gum.CM will continue to follow for d/c needs. Jacquelynn Cree RN, BSN, CCM

## 2011-04-18 NOTE — Consult Note (Signed)
Reason for Consult Acute suicidal ideation, Schizoaffective Disorder, Nicotine Dependence Referring Physician: Dr. Rae Mar is an 56 y.o. male.  HPI: Pt with MR fell in group home. He walked for several days and pain in left hip and leg became worse. ED evaluation established L hip fracture now surgically repaired.  Marland Kitchen  AXIS I Schizoaffective Disorder, Pseudoseizures,  AXIS II Mental Retardation  AXIS III Past Medical History  Diagnosis Date  . Seizures   . Psychiatric disorder   . Schizoaffective disorder   . Organic mood disorder   . Intermittent explosive disorder   . Generalized anxiety disorder   . Mild mental retardation   . COPD (chronic obstructive pulmonary disease)   . Hypertension   . Peptic ulcer   . Thyroid disease   . Prostate hyperplasia with urinary obstruction   . Blind left eye   . Chronic respiratory failure   . Cancer     Past Surgical History  Procedure Date  . Hip pinning,cannulated 04/11/2011    Procedure: CANNULATED HIP PINNING;  Surgeon: Mable Paris, MD;  Location: [facility] OR;  Service: Orthopedics;  Laterality: Left;  Perc. Screw Fixation/Left  AXIS IV Loss of independent care, has guardian, medical problems AXIS IV  GAF 50  Family History  Problem Relation Age of Onset  . Hypertension      Social History:  reports that he has been smoking Cigarettes.  He has been smoking about .25 packs per day. He has never used smokeless tobacco. He reports that he does not drink alcohol or use illicit drugs.  Allergies:  Allergies  Allergen Reactions  . Peanut-Containing Drug Products Anaphylaxis  . Prolixin (Fluphenazine Hcl) Other (See Comments)    "muscle cramps"  . Thorazine (Chlorpromazine Hcl) Other (See Comments)    "burns"  . Lactose Intolerance (Gi) Rash    Medications: I have reviewed the patient's current medications.  Results for orders placed during the hospital encounter of 04/10/11 (from the past 48 hour(s))    PROTIME-INR     Status: Abnormal   Collection Time   04/17/11  5:21 AM      Component Value Range Comment   Prothrombin Time 20.9 (*) 11.6 - 15.2 (seconds)    INR 1.77 (*) 0.00 - 1.49    BASIC METABOLIC PANEL     Status: Normal   Collection Time   04/17/11  5:21 AM      Component Value Range Comment   Sodium 140  135 - 145 (mEq/L)    Potassium 4.3  3.5 - 5.1 (mEq/L)    Chloride 105  96 - 112 (mEq/L)    CO2 27  19 - 32 (mEq/L)    Glucose, Bld 81  70 - 99 (mg/dL)    BUN 19  6 - 23 (mg/dL)    Creatinine, Ser 6.57  0.50 - 1.35 (mg/dL)    Calcium 9.2  8.4 - 10.5 (mg/dL)    GFR calc non Af Amer >90  >90 (mL/min)    GFR calc Af Amer >90  >90 (mL/min)   VALPROIC ACID LEVEL     Status: Normal   Collection Time   04/17/11  5:21 AM      Component Value Range Comment   Valproic Acid Lvl 59.0  50.0 - 100.0 (ug/mL)   CBC     Status: Abnormal   Collection Time   04/17/11  5:01 PM      Component Value Range Comment   WBC 4.5  4.0 - 10.5 (K/uL)    RBC 4.22  4.22 - 5.81 (MIL/uL)    Hemoglobin 11.0 (*) 13.0 - 17.0 (g/dL)    HCT 64.4 (*) 03.4 - 52.0 (%)    MCV 82.2  78.0 - 100.0 (fL)    MCH 26.1  26.0 - 34.0 (pg)    MCHC 31.7  30.0 - 36.0 (g/dL)    RDW 74.2  59.5 - 63.8 (%)    Platelets 239  150 - 400 (K/uL)   PROTIME-INR     Status: Abnormal   Collection Time   04/18/11  5:40 AM      Component Value Range Comment   Prothrombin Time 25.2 (*) 11.6 - 15.2 (seconds)    INR 2.24 (*) 0.00 - 1.49    VALPROIC ACID LEVEL     Status: Abnormal   Collection Time   04/18/11  5:40 AM      Component Value Range Comment   Valproic Acid Lvl 31.0 (*) 50.0 - 100.0 (ug/mL)     Dg Chest 2 View  04/18/2011  *RADIOLOGY REPORT*  Clinical Data: Hemoptysis, confusion  CHEST - 2 VIEW  Comparison: Chest x-ray of 04/16/2011 and CT chest of 04/16/2011  Findings: The lungs are hyperaerated consistent with COPD.  No focal infiltrate or effusion is seen.  Mediastinal contours appear stable.  The heart is within normal  limits in size.  No bony abnormality is seen.  IMPRESSION: COPD.  No active lung disease.  Original Report Authenticated By: Juline Patch, M.D.   Ct Angio Chest W/cm &/or Wo Cm  04/16/2011  *RADIOLOGY REPORT*  Clinical Data: Hemoptysis, chest pain, evaluate for PE  CT ANGIOGRAPHY CHEST  Technique:  Multidetector CT imaging of the chest using the standard protocol during bolus administration of intravenous contrast. Multiplanar reconstructed images including MIPs were obtained and reviewed to evaluate the vascular anatomy.  Contrast: OMNIPAQUE IOHEXOL 350 MG/ML SOLN  Comparison: Chest radiographs dated 04/16/2011.  [name] Long CT chest dated 03/18/2011.  Findings: No evidence of pulmonary embolism.  Again seen are scattered mild patchy opacities in the bilateral upper lobes and right lower lobe, likely reflecting scattered areas of scarring and/or atelectasis. Residual infection is possible but is considered less likely given lack of interval change.  Scarring at the bilateral lung bases.  Mild centrilobular and paraseptal emphysematous changes.  No pleural effusion or pneumothorax.  Visualized thyroid is unremarkable.  The heart is normal in size.  No pericardial effusion.  Coronary atherosclerosis.  Atherosclerotic calcifications of the aortic arch.  Stable mediastinal/right hilar lymphadenopathy, including: --1.6 cm prevascular node (series 4/image 49) --1.3 cm AP window node (series 4/image 37) --1.7 cm right hilar node (series 4/image 47)  Visualized upper abdomen is unremarkable.  Degenerative changes of the visualized thoracolumbar spine.  IMPRESSION: No evidence of pulmonary embolism.  Stable scattered mild patchy opacities in the bilateral upper lobes and right lower lobe, likely reflecting scattered areas of scarring and/or atelectasis. Residual infection is possible but considered less likely given lack of interval change from recent CT.  Underlying mild centrilobular and paraseptal emphysematous  changes.  Stable mediastinal/right hilar lymphadenopathy, as described above.  Original Report Authenticated By: Charline Bills, M.D.    Review of Systems  Unable to perform ROS: other  Neurological: Positive for weakness.   Blood pressure 114/68, pulse 99, temperature 97.8 F (36.6 C), temperature source Oral, resp. rate 16, height 5\' 10"  (1.778 m), weight 70.7 kg (155 lb 13.8 oz), SpO2 91.00%. Physical Exam  Assessment/Plan:  Chart reviewed; Called to evaluate spontaneous claim of suicidal ideation Pt was told this am that he will be going to a SNF to receive PT for strength, ROM and stability  He responded by claiming he was going to commit suicide.  He asked the sitter [RN] r if she would help kill himself.  He implied he would cut his wrist. Psych CSW called guardian who told her that is how he always reacts to a change in his routine.  Reassurance is offered and affirmed that he has 1:1 supervision and that it is best to return to former residence where home PT can visit and provide PT therapy.  Pt is in room.  He arouses quickly and responds with a loud voice.  He requests coffee, has to go to bathroom with walker.  He maneuvers very well, gait is steady.  He then heads out of the room, "I want coffee!"   Prior to his abrupt departure, he says he agrees to returning to his residence and says he will not try to kill himself.  The concern is, he is so impulsive that it is problematic to take him at this word.  The sitter is necessary until he is able to be discharged.  It is understood that he has significant resistance to anything representing change in his usual routine.  RECOMMENDATION.  1. Sitter for safety needed 2. Transfer to former residence with 1:1 supervision when medically stable.[VPA level remains low] 3. Consider home PT to evaluate and promote ambulatory safety 4. Will follow pt.   Kaushik Maul 04/18/2011, 3:26 PM

## 2011-04-18 NOTE — Procedures (Signed)
EEG NUMBER:  [id number].  HISTORY:  A 56 years old man with schizoaffective disorder.  MEDICATIONS:  Keppra.  CONDITION OF RECORDING:  This 16-lead EEG was recorded with the patient in awake and drowsy states.  Background rhythm: background patterns in wakefulness were well organized with a well-sustained posterior dominant rhythm of 8 Hz, symmetrical and reactive to eye opening and closing. Drowsiness was associated with mild attenuation of voltage and slowing of frequencies.  Abnormal potentials: no epileptiform activity or focal slowing was noted.  ACTIVATION PROCEDURES:  Hyperventilation was not performed.  Photic stimulation did not activate tracing.  EKG:  Single-channel of EKG monitoring detected an irregular rhythm.  IMPRESSION:  This was a normal awake and drowsy EEG.  A normal EEG does not rule out the clinical diagnosis of epilepsy. If clinically warranted, a repeat extended EEG or ambulatory recording may be obtained for prolonged recording times and sleep capture, which may increase The diagnostic yield.  Clinical correlation is suggested.          ______________________________ Carmell Austria, MD    [id number] D:  04/17/2011 14:16:25  T:  04/17/2011 22:10:19  Job #:  [id number]

## 2011-04-18 NOTE — Progress Notes (Addendum)
ANTICOAGULATION CONSULT NOTE - Follow Up Consult  Pharmacy Consult for Coumadin - held yesterday 2nd hemoptysis, to continue on 4/18 per IM request Indication: VTE prophylaxis s/p hip surgery  Allergies  Allergen Reactions  . Peanut-Containing Drug Products Anaphylaxis  . Prolixin (Fluphenazine Hcl) Other (See Comments)    "muscle cramps"  . Thorazine (Chlorpromazine Hcl) Other (See Comments)    "burns"  . Lactose Intolerance (Gi) Rash    Patient Measurements: Height: 5\' 10"  (177.8 cm) Weight: 155 lb 13.8 oz (70.7 kg) IBW/kg (Calculated) : 73   Vital Signs: Temp: 97.5 F (36.4 C) (04/17 0637) Temp src: Oral (04/17 0637) BP: 125/71 mmHg (04/17 0637) Pulse Rate: 110  (04/17 0637)  Labs:  Basename 04/18/11 0540 04/17/11 1701 04/17/11 0521 04/16/11 1450 04/16/11 1015 04/16/11 0742  HGB -- 11.0* -- 11.1* -- --  HCT -- 34.7* -- 35.6* -- --  PLT -- 239 -- 238 -- --  APTT -- -- -- -- -- --  LABPROT 25.2* -- 20.9* -- -- 15.9*  INR 2.24* -- 1.77* -- -- 1.24  HEPARINUNFRC -- -- -- -- -- --  CREATININE -- -- 0.86 0.89 -- --  CKTOTAL -- -- -- -- 71 --  CKMB -- -- -- -- 1.4 --  TROPONINI -- -- -- -- <0.30 --   Estimated Creatinine Clearance: 94.8 ml/min (by C-G formula based on Cr of 0.86).   Assessment: 56 yo M POD#7 s/p left hip surgery. He was on Coumadin for VTE prophylaxis x 30 days with lower goal INR of 1.5-2 per MD . Coumadin held yesterday 2nd continued hemoptysis and CT negative for PE on 04/16/11.   Lovenox was dc'd yesterday because INR had reached 1.5 - 2 goal.  Today INR =2.24.  No more hemoptysis - perhaps pt bit his tongue. Discussed w/ Dr. Benjamine Mola of IM. Will resume coumadin 04/19/11  Goal of Therapy:  INR 1.5-2.5 per ortho MD   Plan:  1.  Coumadin held 4/16 by MD  2nd hemoptysis, now to resume per primary team on Thursday 4/18 2. No coumadin today 3.  LMWH 40 mg qday stopped 4/16, last dose 4/17 4.  Valproic acid level low this am 2nd to pt refused 2 doses of VPA  yest 2nd new swallowing problems, now swallowing OK Herby Abraham, Pharm.D. [phone] 04/18/2011 8:43 AM

## 2011-04-19 LAB — PROTIME-INR
INR: 1.78 — ABNORMAL HIGH (ref 0.00–1.49)
Prothrombin Time: 21 seconds — ABNORMAL HIGH (ref 11.6–15.2)

## 2011-04-19 MED ORDER — WARFARIN SODIUM 5 MG PO TABS: 5.0000 mg | ORAL_TABLET | Freq: Every day | ORAL | Status: DC

## 2011-04-19 MED ORDER — HALOPERIDOL 2 MG PO TABS: 2.0000 mg | ORAL_TABLET | Freq: Four times a day (QID) | ORAL | Status: DC | PRN

## 2011-04-19 MED ORDER — WARFARIN SODIUM 5 MG PO TABS
5.0000 mg | ORAL_TABLET | Freq: Every day | ORAL | Status: DC
  Filled 2011-04-19: qty 1

## 2011-04-19 MED ORDER — HYDROCODONE-ACETAMINOPHEN 5-325 MG PO TABS: 1.0000 | ORAL_TABLET | ORAL | Status: AC | PRN

## 2011-04-19 MED ORDER — DIVALPROEX SODIUM 125 MG PO CPSP: 750.0000 mg | ORAL_CAPSULE | Freq: Three times a day (TID) | ORAL | Status: DC

## 2011-04-19 MED ORDER — LEVETIRACETAM 500 MG PO TABS: 500.0000 mg | ORAL_TABLET | Freq: Two times a day (BID) | ORAL | Status: DC

## 2011-04-19 NOTE — Progress Notes (Signed)
Physical Therapy Treatment Patient Details Name: [name] MRN: [id number] DOB: [birth year] Today's Date: 04/19/2011  PT Assessment/Plan  PT - Assessment/Plan Comments on Treatment Session: Pt moves well but requires supervision for mobility due to impulsive behavior.    discussed with CSW on 04/18/11 Re: d/c plans of Home with HHPT vs ST-SNF.  Due to pt's progression with mobility & CSW has confirmed pt will have 1:1, 24 hr care, pt would be appropriate to d/c home with HHPT PT Plan: Discharge plan needs to be updated PT Frequency: Min 3X/week Follow Up Recommendations: Home health PT;Supervision for mobility/OOB Equipment Recommended: Rolling walker with 5" wheels PT Goals  Acute Rehab PT Goals PT Goal: Supine/Side to Sit - Progress: Progressing toward goal PT Goal: Sit to Stand - Progress: Progressing toward goal PT Goal: Stand to Sit - Progress: Progressing toward goal PT Goal: Ambulate - Progress: Met  PT Treatment Precautions/Restrictions  Precautions Precautions: Fall Restrictions Weight Bearing Restrictions: Yes LLE Weight Bearing: Weight bearing as tolerated Mobility (including Balance) Bed Mobility Bed Mobility: Yes Supine to Sit: 5: Supervision;HOB flat Supine to Sit Details (indicate cue type and reason): supervision for safety due to pt's impulsiveness but no physical (A) needed.  Min facial grimmacing with transitioning.   Sitting - Scoot to Edge of Bed: 5: Supervision Sitting - Scoot to [place] of Bed Details (indicate cue type and reason): Supervision for safety due to pt's impulsiveness.  Transfers Transfers: Yes Sit to Stand: 5: Supervision;From bed;With upper extremity assist Sit to Stand Details (indicate cue type and reason): Supervision for safety due to pt's impulsiveness & standing before RW in place for safety.   Stand to Sit: 5: Supervision;To chair/3-in-1;With armrests;With upper extremity assist Stand to Sit Details: Cues for body positioning before  sitting, & safe hand placement for controlled descent.   Ambulation/Gait Ambulation/Gait: Yes Ambulation/Gait Assistance: 5: Supervision Ambulation/Gait Assistance Details (indicate cue type and reason): Cues for safe use of RW, & attention to task.  Pt easiliy distracted & impulsive behaviors with RW., however pt is easily redirected & follows safety cues well but requires them often.   Ambulation Distance (Feet): 200 Feet Assistive device: Rolling walker Gait Pattern: Step-through pattern Stairs: No Wheelchair Mobility Wheelchair Mobility: No  Posture/Postural Control Posture/Postural Control: No significant limitations Balance Balance Assessed: No Exercise  General Exercises - Lower Extremity Long Arc Quad: Left;AROM;Strengthening;15 reps;Seated Hip ABduction/ADduction: AAROM;Strengthening;Left;15 reps;Seated Straight Leg Raises: AROM;Strengthening;Left;15 reps;Supine Hip Flexion/Marching: AROM;Strengthening;Left;Other reps (comment);Seated (15 reps) End of Session PT - End of Session Equipment Utilized During Treatment: Gait belt Activity Tolerance: Patient tolerated treatment well Patient left: in chair;with call bell in reach;with family/visitor present (pt has sitter) Nurse Communication: Mobility status for transfers;Mobility status for ambulation General Behavior During Session: [facility] for tasks performed Cognition: Impaired, at baseline  Lara Mulch 04/19/2011, 12:13 PM [phone]

## 2011-04-19 NOTE — Progress Notes (Signed)
Clinical Social Worker facilitated pt discharge needs including contacting pt caregiver Nonnie Done and pt legal guardian, Doyce Para. Clinical Child psychotherapist provided pt discharge packet to RN and RN expressed understanding that discharge packet needs to be provided to pt caregiver at discharge.  No further social work need identified at this time.   Jacklynn Lewis, MSW, LCSWA  Clinical Social Work [phone]

## 2011-04-19 NOTE — Progress Notes (Signed)
PATIENT ID: [name]  MRN: [id number]  DOB/AGE:  04/25/1955 / 56 y.o.  8 Days Post-Op Procedure(s) (LRB): CANNULATED HIP PINNING (Left)  Subjective: Pain is none.  No c/o chest pain or SOB.   Ready for d/c   Objective: Vital signs in last 24 hours: Temp:  [97.8 F (36.6 C)] 97.8 F (36.6 C) (04/17 1340) Pulse Rate:  [99] 99  (04/17 1340) Resp:  [16] 16  (04/17 1340) BP: (114)/(68) 114/68 mmHg (04/17 1340)  Intake/Output from previous day: 04/17 0701 - 04/18 0700 In: 1400 [P.O.:1400] Out: -  Intake/Output this shift:     Basename 04/17/11 1701 04/16/11 1450  HGB 11.0* 11.1*    Basename 04/17/11 1701 04/16/11 1450  WBC 4.5 3.8*  RBC 4.22 4.25  HCT 34.7* 35.6*  PLT 239 238    Basename 04/17/11 0521 04/16/11 1450  NA 140 136  K 4.3 4.3  CL 105 100  CO2 27 28  BUN 19 18  CREATININE 0.86 0.89  GLUCOSE 81 109*  CALCIUM 9.2 9.6    Basename 04/18/11 0540 04/17/11 0521  LABPT -- --  INR 2.24* 1.77*    Physical Exam: Dorsiflexion/Plantar flexion intact Incision: no drainage Compartment soft  Assessment/Plan: 8 Days Post-Op Procedure(s) (LRB): CANNULATED HIP PINNING (Left)   Up with therapy Weight Bearing as Tolerated (WBAT)  VTE prophylaxis: pharmacologic prophylaxis (with any of the following: warfarin adjusted-dose) F/u in 1 wk for staple removal.   Mable Paris 04/19/2011, 7:07 AM

## 2011-04-19 NOTE — Progress Notes (Signed)
Attempted to see pt x 2.  Pt working with PT, then refusing to talk, interact.  Jeani Hawking, OTR/L [phone]

## 2011-04-19 NOTE — Progress Notes (Signed)
04/19/2011 [facility], Bosie Clos [facility] Case Management Note [phone]    CARE MANAGEMENT NOTE 04/19/2011  Patient:  [name], [name]   Account Number:  [id number]  Date Initiated:  04/18/2011  Documentation initiated by:  [facility]  Subjective/Objective Assessment:   Admitted with fractured left hip. From group home.     Action/Plan:   Anticipated DC Date:  04/19/2011   Anticipated DC Plan:  HOME W HOME HEALTH SERVICES  In-house referral  Clinical Social Worker      DC Planning Services  CM consult      Choice offered to / List presented to:  C-2 HC POA / Guardian   DME arranged  WALKER - ROLLING      DME agency  [facility]     HH arranged  HH-3 OT  HH-2 PT  HH-1 RN      [facility] agency  [facility]   Status of service:  In process, will continue to follow Medicare Important Message given?   (If response is "NO", the following Medicare IM given date fields will be blank) Date Medicare IM given:   Date Additional Medicare IM given:    Discharge Disposition:  HOME W HOME HEALTH SERVICES  Per UR Regulation:  Reviewed for med. necessity/level of care/duration of stay  If discussed at Long Length of Stay Meetings, dates discussed:   04/18/2011    Comments:  PCP Jackalyn Lombard  Legal Ancil Linsey [phone] Caregiver-Ryan Benton [phone]  04/19/11-1007-J.Vermon Grays,RN,BSN [phone]      Noted order for rolling walker. Jill Alexanders with [facility] notified of need. No further discharge needs or home needs identified.  04/18/11 Patient not responding well to plan for d/c to SNF.D/c plan changed to return to group home with care giver and HHPT and OT. Contacted patient's legal guardian Doyce Para to discuss d/c plans. Ms Elwyn Reach had spoken with CSW Jacklynn Lewis and CSW Unk Lightning and is in agreement with patient returning to the group home with his care giver and HHPT and HHOT. Ms Elwyn Reach chose [facility] from the [facility] list of  agencies.Contacted Mary at [facility] and requested HHPT,HHOT and [facility], awaiting orders. Mickel Baas contact numbers for Ms Elwyn Reach and Mr Eliseo Gum.CM will continue to follow for d/c needs. Jacquelynn Cree RN, BSN, CCM

## 2011-04-19 NOTE — Progress Notes (Signed)
ANTICOAGULATION CONSULT NOTE - Follow Up Consult  Pharmacy Consult for Coumadin  Indication: VTE prophylaxis s/p hip surgery  Allergies  Allergen Reactions  . Peanut-Containing Drug Products Anaphylaxis  . Prolixin (Fluphenazine Hcl) Other (See Comments)    "muscle cramps"  . Thorazine (Chlorpromazine Hcl) Other (See Comments)    "burns"  . Lactose Intolerance (Gi) Rash    Patient Measurements: Height: 5\' 10"  (177.8 cm) Weight: 155 lb 13.8 oz (70.7 kg) IBW/kg (Calculated) : 73   Vital Signs:    Labs:  Basename 04/19/11 0934 04/18/11 0540 04/17/11 1701 04/17/11 0521 04/16/11 1450  HGB -- -- 11.0* -- 11.1*  HCT -- -- 34.7* -- 35.6*  PLT -- -- 239 -- 238  APTT -- -- -- -- --  LABPROT 21.0* 25.2* -- 20.9* --  INR 1.78* 2.24* -- 1.77* --  HEPARINUNFRC -- -- -- -- --  CREATININE -- -- -- 0.86 0.89  CKTOTAL -- -- -- -- --  CKMB -- -- -- -- --  TROPONINI -- -- -- -- --   Estimated Creatinine Clearance: 94.8 ml/min (by C-G formula based on Cr of 0.86).   Assessment: 56 yo M POD#8 s/p left hip surgery. He is on Coumadin for VTE prophylaxis x 30 days with lower goal INR of 1.5-2 per MD .  Today is day # 8/30 of coumadin. Coumadin held yesterday 4/16 2nd hemoptysis.  CT negative for PE on 04/16/11.  Today INR =1.78. INR within MD goal of 1.5-2.   No more hemoptysis - perhaps pt bit his tongue.  Unclear who will f/u his coumadin as outpt. Goal of Therapy:  INR 1.5-2.5 per ortho MD   Plan:  1.  Coumadin 5 mg po daily 2. Per MD notes, INR on Friday AM as outpt. Herby Abraham, Pharm.D. [phone] 04/19/2011 10:32 AM

## 2011-04-19 NOTE — Progress Notes (Signed)
Agree with PTA discharge update.  Braxtyn Bojarski, PT DPT [phone]  

## 2011-04-19 NOTE — Plan of Care (Signed)
Problem: Discharge Progression Outcomes Goal: Ambulates safely using assistive device Outcome: Completed/Met Date Met:  04/19/11 With supervision

## 2011-05-05 ENCOUNTER — Emergency Department (HOSPITAL_BASED_OUTPATIENT_CLINIC_OR_DEPARTMENT_OTHER)
Admission: EM | Admit: 2011-05-05 | Discharge: 2011-05-05 | Disposition: A | Discharge status: Home / Self Care | Payer: Medicare Other | Attending: Emergency Medicine | Admitting: Emergency Medicine

## 2011-05-05 ENCOUNTER — Encounter (HOSPITAL_BASED_OUTPATIENT_CLINIC_OR_DEPARTMENT_OTHER): Payer: Self-pay | Admitting: *Deleted

## 2011-05-05 DIAGNOSIS — J449 Chronic obstructive pulmonary disease, unspecified: Secondary | ICD-10-CM | POA: Insufficient documentation

## 2011-05-05 DIAGNOSIS — I10 Essential (primary) hypertension: Secondary | ICD-10-CM | POA: Insufficient documentation

## 2011-05-05 DIAGNOSIS — F79 Unspecified intellectual disabilities: Secondary | ICD-10-CM | POA: Insufficient documentation

## 2011-05-05 DIAGNOSIS — H5440 Blindness, one eye, unspecified eye: Secondary | ICD-10-CM | POA: Insufficient documentation

## 2011-05-05 DIAGNOSIS — F259 Schizoaffective disorder, unspecified: Secondary | ICD-10-CM | POA: Insufficient documentation

## 2011-05-05 DIAGNOSIS — E079 Disorder of thyroid, unspecified: Secondary | ICD-10-CM | POA: Insufficient documentation

## 2011-05-05 DIAGNOSIS — J4489 Other specified chronic obstructive pulmonary disease: Secondary | ICD-10-CM | POA: Insufficient documentation

## 2011-05-05 DIAGNOSIS — J9610 Chronic respiratory failure, unspecified whether with hypoxia or hypercapnia: Secondary | ICD-10-CM | POA: Insufficient documentation

## 2011-05-05 DIAGNOSIS — R569 Unspecified convulsions: Secondary | ICD-10-CM | POA: Insufficient documentation

## 2011-05-05 DIAGNOSIS — Z4802 Encounter for removal of sutures: Secondary | ICD-10-CM | POA: Insufficient documentation

## 2011-05-05 NOTE — ED Provider Notes (Signed)
Medical screening examination/treatment/procedure(s) were performed by non-physician practitioner and as supervising physician I was immediately available for consultation/collaboration.   Frederick Marro, MD 05/05/11 1450 

## 2011-05-05 NOTE — ED Notes (Signed)
Pt here for staple removal  

## 2011-05-05 NOTE — ED Provider Notes (Signed)
History     CSN: [id number]  Arrival date & time 05/05/11  1329   First MD Initiated Contact with Patient 05/05/11 1334      Chief Complaint  Patient presents with  . Suture / Staple Removal    (Consider location/radiation/quality/duration/timing/severity/associated sxs/prior treatment) HPI History provided by pt and prior chart.  Per prior chart, pt admitted for left hip fx 4/9-4/18.  Hip pinned on 4/10 and on 4/18, Dr. Veda Canning plan was for f/u and staple removal in 7 days.  Pt presents to ED for staple removal.  He reports mild pain of left hip.  No associated fever or drainage of wound.  No other complaints at this time.    Past Medical History  Diagnosis Date  . Seizures   . Psychiatric disorder   . Schizoaffective disorder   . Organic mood disorder   . Intermittent explosive disorder   . Generalized anxiety disorder   . Mild mental retardation   . COPD (chronic obstructive pulmonary disease)   . Hypertension   . Peptic ulcer   . Thyroid disease   . Prostate hyperplasia with urinary obstruction   . Blind left eye   . Chronic respiratory failure   . Cancer     Past Surgical History  Procedure Date  . Hip pinning,cannulated 04/11/2011    Procedure: CANNULATED HIP PINNING;  Surgeon: Mable Paris, MD;  Location: [facility] OR;  Service: Orthopedics;  Laterality: Left;  Perc. Screw Fixation/Left    Family History  Problem Relation Age of Onset  . Hypertension      History  Substance Use Topics  . Smoking status: Current Everyday Smoker -- 0.2 packs/day    Types: Cigarettes  . Smokeless tobacco: Never Used  . Alcohol Use: No      Review of Systems  All other systems reviewed and are negative.    Allergies  Peanut-containing drug products; Prolixin; Thorazine; and Lactose intolerance (gi)  Home Medications   Current Outpatient Rx  Name Route Sig Dispense Refill  . IPRATROPIUM-ALBUTEROL 18-103 MCG/ACT IN AERO Inhalation Inhale 2 puffs into the  lungs every 6 (six) hours as needed. Shortness of breath.    . AMLODIPINE BESYLATE 10 MG PO TABS Oral Take 10 mg by mouth daily.      Marland Kitchen DIPHENHYDRAMINE HCL 50 MG PO CAPS Oral Take 50 mg by mouth at bedtime as needed. For insomnia    . DIVALPROEX SODIUM 125 MG PO CPSP Oral Take 6 capsules (750 mg total) by mouth every 8 (eight) hours. 90 capsule 0  . DOCUSATE SODIUM 100 MG PO CAPS Oral Take 100 mg by mouth 2 (two) times daily.    Marland Kitchen FLUTICASONE PROPIONATE  HFA 44 MCG/ACT IN AERO Inhalation Inhale 2 puffs into the lungs 2 (two) times daily.    Marland Kitchen HALOPERIDOL 2 MG PO TABS Oral Take 1 tablet (2 mg total) by mouth every 6 (six) hours as needed (aggitation). agitation 20 tablet 0  . LEVETIRACETAM 500 MG PO TABS Oral Take 1 tablet (500 mg total) by mouth 2 (two) times daily. 60 tablet 0  . LEVOTHYROXINE SODIUM 75 MCG PO TABS Oral Take 75 mcg by mouth daily.      Marland Kitchen LITHIUM CARBONATE 300 MG PO CAPS Oral Take 300 mg by mouth 3 (three) times daily with meals.    . OMEPRAZOLE 20 MG PO CPDR Oral Take 20 mg by mouth 2 (two) times daily.      Marland Kitchen RISPERIDONE 1 MG PO TABS  Oral Take 1 mg by mouth daily.    Marland Kitchen RISPERIDONE 2 MG PO TABS Oral Take 2 mg by mouth daily at 8 pm.     . TAMSULOSIN HCL 0.4 MG PO CAPS Oral Take by mouth.    . TRAZODONE HCL 100 MG PO TABS Oral Take 100 mg by mouth at bedtime.      . WARFARIN SODIUM 5 MG PO TABS Oral Take 1 tablet (5 mg total) by mouth daily at 6 PM. 15 tablet 0    BP 162/102  Pulse 116  Temp(Src) 98.2 F (36.8 C) (Oral)  Resp 20  SpO2 96%  Physical Exam  Nursing note and vitals reviewed. Constitutional: He is oriented to person, place, and time. He appears well-developed and well-nourished. No distress.  HENT:  Head: Normocephalic and atraumatic.  Eyes:       Normal appearance  Neck: Normal range of motion.  Cardiovascular: Normal rate and regular rhythm.   Pulmonary/Chest: Effort normal and breath sounds normal. No respiratory distress.  Musculoskeletal: Normal  range of motion.       Pt ambulated well w/ walker and does not appear uncomfortable.   Neurological: He is alert and oriented to person, place, and time.  Skin: Skin is warm and dry. No rash noted.       Left lateral proximal thigh w/ 3cm vertical wound.  4 staples in place.  Healing well.  No drainage, induration, surrounding erythema.  Mildly ttp.    Psychiatric: He has a normal mood and affect. His behavior is normal.    ED Course  SUTURE REMOVAL Date/Time: 05/05/2011 2:12 PM Performed by: Otilio Miu Authorized by: Ruby Cola E Consent: Verbal consent obtained. Consent given by: patient Patient identity confirmed: verbally with patient Body area: lower extremity Location details: left hip Wound Appearance: clean Staples Removed: 4 Post-removal: dressing applied Facility: sutures placed in this facility Patient tolerance: Patient tolerated the procedure well with no immediate complications.   (including critical care time)  Labs Reviewed - No data to display No results found.   1. Removal of staples       MDM  Pt presents for staple removal of left proximal lateral thigh.  Wound healing well and no signs of infection.  Staples removed.  Pt d/c'd home.  Return precautions discussed.         Arie Sabina [place], [place] 05/05/11 1413

## 2011-05-05 NOTE — Discharge Instructions (Signed)
You should return to the ER if you develop fever, worsening pain, drainage of pus or spreading redness at site of wound.  Staple Removal Care After The staples used to close your skin have been removed. The wound needs continued care so it can heal completely and without problems. The care described here will need to be done for another 5-10 days unless your caregiver advises otherwise.  HOME CARE INSTRUCTIONS   Keep wound site dry and clean.   If skin adhesive strips were applied after the staples were removed, they will begin to peel off in a few days. If they remain after fourteen days, they may be peeled off and discarded.   If you still have a dressing, change it at least once a day or as instructed by your caregiver. If the bandage sticks, soak it off with warm water. Pat dry with a clean towel. Look for signs of infection (see below).   Reapply cream or ointment according to your caregiver's instruction. This will help prevent infection and keep the bandage from sticking. Use of a non-stick material over the wound and under the dressing or wrap will also help keep the bandage from sticking.   If the bandage becomes wet, dirty or develops a foul smell, change it as soon as possible.   New scars become sunburned easily. Use sunscreens with protection factor (SPF) of at least 15 when out in the sun.   Only take over-the-counter or prescription medicines for pain, discomfort or fever as directed by your caregiver.  SEEK IMMEDIATE MEDICAL CARE IF:   There is redness, swelling or increasing pain in the wound.   Pus is coming from the wound.   An unexplained oral temperature above 102 F (38.9 C) develops.   You notice a foul smell coming from the wound or dressing.   There is a breaking open of the suture line (edges not staying together) of the wound edges after staples have been removed.  Document Released: 12/01/2007 Document Revised: 12/07/2010 Document Reviewed:  12/01/2007 [facility] Patient Information 2012 [place], [place].

## 2011-05-06 ENCOUNTER — Emergency Department (HOSPITAL_COMMUNITY)
Admission: EM | Admit: 2011-05-06 | Discharge: 2011-05-06 | Disposition: A | Discharge status: Home / Self Care | Payer: Medicare Other | Attending: Emergency Medicine | Admitting: Emergency Medicine

## 2011-05-06 DIAGNOSIS — H5440 Blindness, one eye, unspecified eye: Secondary | ICD-10-CM | POA: Insufficient documentation

## 2011-05-06 DIAGNOSIS — E079 Disorder of thyroid, unspecified: Secondary | ICD-10-CM | POA: Insufficient documentation

## 2011-05-06 DIAGNOSIS — M25559 Pain in unspecified hip: Secondary | ICD-10-CM | POA: Insufficient documentation

## 2011-05-06 DIAGNOSIS — I10 Essential (primary) hypertension: Secondary | ICD-10-CM | POA: Insufficient documentation

## 2011-05-06 DIAGNOSIS — F259 Schizoaffective disorder, unspecified: Secondary | ICD-10-CM | POA: Insufficient documentation

## 2011-05-06 DIAGNOSIS — J4489 Other specified chronic obstructive pulmonary disease: Secondary | ICD-10-CM | POA: Insufficient documentation

## 2011-05-06 DIAGNOSIS — M25552 Pain in left hip: Secondary | ICD-10-CM

## 2011-05-06 DIAGNOSIS — R569 Unspecified convulsions: Secondary | ICD-10-CM | POA: Insufficient documentation

## 2011-05-06 DIAGNOSIS — J9610 Chronic respiratory failure, unspecified whether with hypoxia or hypercapnia: Secondary | ICD-10-CM | POA: Insufficient documentation

## 2011-05-06 DIAGNOSIS — F17200 Nicotine dependence, unspecified, uncomplicated: Secondary | ICD-10-CM | POA: Insufficient documentation

## 2011-05-06 DIAGNOSIS — J449 Chronic obstructive pulmonary disease, unspecified: Secondary | ICD-10-CM | POA: Insufficient documentation

## 2011-05-06 LAB — PROTIME-INR: Prothrombin Time: 15.2 seconds (ref 11.6–15.2)

## 2011-05-06 MED ORDER — ACETAMINOPHEN 325 MG PO TABS
650.0000 mg | ORAL_TABLET | Freq: Once | ORAL | Status: AC
  Administered 2011-05-06: 650 mg via ORAL
  Filled 2011-05-06: qty 2

## 2011-05-06 NOTE — ED Notes (Signed)
Called [name] at the group home [phone] and told him he was ready to be discharged and he said he would be here at 9 o'clock.  Instructed that they need to come now and he said to call the ambulance.  Told him they would have to pay and he said he would be here at 9 o'clock.

## 2011-05-06 NOTE — ED Notes (Signed)
C/o left hip pain, amb without difficulty

## 2011-05-06 NOTE — ED Notes (Signed)
NAD noted at his time resting quietly with eyes closed.

## 2011-05-06 NOTE — ED Notes (Signed)
Patient up and walking around to go to the bathroom and looking for more coffee.  Told him we ordered a breakfast tray and it would have coffee on it.

## 2011-05-06 NOTE — ED Notes (Signed)
Contacted Ryan from group home and he advised that he was in route to pick patient up. Patient remains alert and oriented to normal status. Voided 400 cc of clear yellow urine. Drinking coffee and states "I feel better."

## 2011-05-06 NOTE — ED Notes (Signed)
Patient c/o left hip pain.  EMS picked patient up on someone's front porch. Moves all ext well.

## 2011-05-06 NOTE — ED Provider Notes (Signed)
History     CSN: [id number]  Arrival date & time 05/06/11  4259   First MD Initiated Contact with Patient 05/06/11 0422      Chief Complaint  Patient presents with  . Hip Pain    (Consider location/radiation/quality/duration/timing/severity/associated sxs/prior treatment) HPI Comments: Patient had pinning of a femoral neck fracture on April 9.  He was in the emergency room on May 4 and had 3 staples removed.  He returns today with pain at the site.  Per EMS.  He walked numerous miles and was found sitting on a strangers or itch.  Sitting in a rocking chair.  On arrival in the emergency room he is requesting food, coffee.   The history is provided by the patient.    Past Medical History  Diagnosis Date  . Seizures   . Psychiatric disorder   . Schizoaffective disorder   . Organic mood disorder   . Intermittent explosive disorder   . Generalized anxiety disorder   . Mild mental retardation   . COPD (chronic obstructive pulmonary disease)   . Hypertension   . Peptic ulcer   . Thyroid disease   . Prostate hyperplasia with urinary obstruction   . Blind left eye   . Chronic respiratory failure   . Cancer     Past Surgical History  Procedure Date  . Hip pinning,cannulated 04/11/2011    Procedure: CANNULATED HIP PINNING;  Surgeon: Mable Paris, MD;  Location: [facility] OR;  Service: Orthopedics;  Laterality: Left;  Perc. Screw Fixation/Left    Family History  Problem Relation Age of Onset  . Hypertension      History  Substance Use Topics  . Smoking status: Current Everyday Smoker -- 0.2 packs/day    Types: Cigarettes  . Smokeless tobacco: Never Used  . Alcohol Use: No      Review of Systems  Constitutional: Negative for fever.  Respiratory: Negative for shortness of breath.   Cardiovascular: Negative for leg swelling.  Neurological: Negative for weakness.    Allergies  Peanut-containing drug products; Prolixin; Thorazine; and Lactose intolerance  (gi)  Home Medications   Current Outpatient Rx  Name Route Sig Dispense Refill  . IPRATROPIUM-ALBUTEROL 18-103 MCG/ACT IN AERO Inhalation Inhale 2 puffs into the lungs every 6 (six) hours as needed. Shortness of breath.    . AMLODIPINE BESYLATE 10 MG PO TABS Oral Take 10 mg by mouth daily.      Marland Kitchen DIPHENHYDRAMINE HCL 50 MG PO CAPS Oral Take 50 mg by mouth at bedtime as needed. For insomnia    . DIVALPROEX SODIUM 125 MG PO CPSP Oral Take 6 capsules (750 mg total) by mouth every 8 (eight) hours. 90 capsule 0  . DOCUSATE SODIUM 100 MG PO CAPS Oral Take 100 mg by mouth 2 (two) times daily.    Marland Kitchen FLUTICASONE PROPIONATE  HFA 44 MCG/ACT IN AERO Inhalation Inhale 2 puffs into the lungs 2 (two) times daily.    Marland Kitchen HALOPERIDOL 2 MG PO TABS Oral Take 1 tablet (2 mg total) by mouth every 6 (six) hours as needed (aggitation). agitation 20 tablet 0  . LEVETIRACETAM 500 MG PO TABS Oral Take 1 tablet (500 mg total) by mouth 2 (two) times daily. 60 tablet 0  . LEVOTHYROXINE SODIUM 75 MCG PO TABS Oral Take 75 mcg by mouth daily.      Marland Kitchen LITHIUM CARBONATE 300 MG PO CAPS Oral Take 300 mg by mouth 3 (three) times daily with meals.    . OMEPRAZOLE  20 MG PO CPDR Oral Take 20 mg by mouth 2 (two) times daily.      Marland Kitchen RISPERIDONE 1 MG PO TABS Oral Take 1 mg by mouth daily.    Marland Kitchen RISPERIDONE 2 MG PO TABS Oral Take 2 mg by mouth daily at 8 pm.     . TAMSULOSIN HCL 0.4 MG PO CAPS Oral Take by mouth.    . TRAZODONE HCL 100 MG PO TABS Oral Take 100 mg by mouth at bedtime.      . WARFARIN SODIUM 5 MG PO TABS Oral Take 1 tablet (5 mg total) by mouth daily at 6 PM. 15 tablet 0    BP 145/102  Pulse 93  Temp(Src) 97 F (36.1 C) (Oral)  Resp 18  SpO2 98%  Physical Exam  Constitutional: He appears well-developed and well-nourished.  Neck: Normal range of motion.  Cardiovascular: Normal rate.   Pulmonary/Chest: Effort normal.  Abdominal: Soft.  Musculoskeletal: Normal range of motion.  Neurological: He is alert.  Skin:        The small suture line on the lateral left hip well approximated slightly red, not swollen.  No exudate.  No surrounding erythema    ED Course  Procedures (including critical care time)   Labs Reviewed  PROTIME-INR   No results found.   No diagnosis found.    MDM  Will check patient's INR and administer Tylenol for discomfort Patient chart will not be able to pick them up until 9:00 breakfast or has been ordered        Arman Filter, NP 05/06/11 1610

## 2011-05-06 NOTE — ED Notes (Signed)
Patient was discharged via wheelchair to the extended stay doors. Group Home staff refused to come into the hospital to sign for patient to be discharged.

## 2011-05-06 NOTE — Discharge Instructions (Signed)
Arthralgia Arthralgia is joint pain. A joint is a place where two bones meet. Joint pain can happen for many reasons. The joint can be bruised, stiff, infected, or weak from aging. Pain usually goes away after resting and taking medicine for soreness.  HOME CARE  Rest the joint as told by your doctor.   Keep the sore joint raised (elevated) for the first 24 hours.   Put ice on the joint area.   Put ice in a plastic bag.   Place a towel between your skin and the bag.   Leave the ice on for 15 to 20 minutes, 3 to 4 times a day.   Wear your splint, casting, elastic bandage, or sling as told by your doctor.   Only take medicine as told by your doctor. Do not take aspirin.   Use crutches as told by your doctor. Do not put weight on the joint until told to by your doctor.  GET HELP RIGHT AWAY IF:   You have bruising, puffiness (swelling), or more pain.   Your fingers or toes turn blue or start to lose feeling (numb).   Your medicine does not lessen the pain.   Your pain becomes severe.   You have a temperature by mouth above 102 F (38.9 C), not controlled by medicine.   You cannot move or use the joint.  MAKE SURE YOU:   Understand these instructions.   Will watch your condition.   Will get help right away if you are not doing well or get worse.  Document Released: 12/06/2008 Document Revised: 12/07/2010 Document Reviewed: 12/06/2008 [facility] Patient Information 2012 [place], [place]. He can safely take Tylenol, ibuprofen for your discomfort

## 2011-05-06 NOTE — ED Provider Notes (Signed)
Medical screening examination/treatment/procedure(s) were performed by non-physician practitioner and as supervising physician I was immediately available for consultation/collaboration.  Olivia Mackie, MD 05/06/11 [phone]

## 2011-05-07 ENCOUNTER — Encounter (HOSPITAL_COMMUNITY): Payer: Self-pay | Admitting: *Deleted

## 2011-05-30 ENCOUNTER — Encounter (HOSPITAL_COMMUNITY): Payer: Self-pay | Admitting: *Deleted

## 2011-05-30 ENCOUNTER — Encounter (HOSPITAL_COMMUNITY): Payer: Self-pay | Admitting: Emergency Medicine

## 2011-05-30 ENCOUNTER — Emergency Department (HOSPITAL_COMMUNITY)
Admission: EM | Admit: 2011-05-30 | Discharge: 2011-05-30 | Disposition: A | Discharge status: Home / Self Care | Payer: Medicare Other | Attending: Emergency Medicine | Admitting: Emergency Medicine

## 2011-05-30 ENCOUNTER — Emergency Department (HOSPITAL_COMMUNITY): Payer: Medicare Other

## 2011-05-30 DIAGNOSIS — R079 Chest pain, unspecified: Secondary | ICD-10-CM | POA: Insufficient documentation

## 2011-05-30 DIAGNOSIS — R062 Wheezing: Secondary | ICD-10-CM | POA: Insufficient documentation

## 2011-05-30 DIAGNOSIS — E119 Type 2 diabetes mellitus without complications: Secondary | ICD-10-CM | POA: Insufficient documentation

## 2011-05-30 DIAGNOSIS — F17200 Nicotine dependence, unspecified, uncomplicated: Secondary | ICD-10-CM | POA: Insufficient documentation

## 2011-05-30 DIAGNOSIS — F79 Unspecified intellectual disabilities: Secondary | ICD-10-CM | POA: Insufficient documentation

## 2011-05-30 DIAGNOSIS — I10 Essential (primary) hypertension: Secondary | ICD-10-CM | POA: Insufficient documentation

## 2011-05-30 DIAGNOSIS — I252 Old myocardial infarction: Secondary | ICD-10-CM | POA: Insufficient documentation

## 2011-05-30 DIAGNOSIS — R059 Cough, unspecified: Secondary | ICD-10-CM | POA: Insufficient documentation

## 2011-05-30 DIAGNOSIS — R05 Cough: Secondary | ICD-10-CM

## 2011-05-30 DIAGNOSIS — Z8659 Personal history of other mental and behavioral disorders: Secondary | ICD-10-CM | POA: Insufficient documentation

## 2011-05-30 DIAGNOSIS — I2510 Atherosclerotic heart disease of native coronary artery without angina pectoris: Secondary | ICD-10-CM | POA: Insufficient documentation

## 2011-05-30 HISTORY — DX: Acute myocardial infarction, unspecified: I21.9

## 2011-05-30 HISTORY — DX: Atherosclerotic heart disease of native coronary artery without angina pectoris: I25.10

## 2011-05-30 MED ORDER — ALBUTEROL SULFATE (5 MG/ML) 0.5% IN NEBU
5.0000 mg | INHALATION_SOLUTION | Freq: Once | RESPIRATORY_TRACT | Status: AC
  Administered 2011-05-30: 5 mg via RESPIRATORY_TRACT
  Filled 2011-05-30: qty 1

## 2011-05-30 NOTE — ED Notes (Signed)
Chest pain onset since 1 day ago pt is from unknown group home which he has wondered away from. Found at front door of [address] [place]. Elope from group.

## 2011-05-30 NOTE — ED Notes (Signed)
Spoke with Nonnie Done who is now on his way to pick up patient

## 2011-05-30 NOTE — ED Provider Notes (Signed)
History     CSN: [id number]  Arrival date & time 05/30/11  0865   First MD Initiated Contact with Patient 05/30/11 [phone]      Chief Complaint  Patient presents with  . Pleurisy  . Chest Pain    Patient is a 56 y.o. male presenting with chest pain. The history is provided by the patient. The history is limited by a developmental delay.  Chest Pain Episode onset: an unknown time ago. Chest pain occurs constantly. The chest pain is unchanged. The pain is associated with breathing. The severity of the pain is mild. The quality of the pain is described as aching. Chest pain is worsened by deep breathing. Primary symptoms include cough. He tried nothing for the symptoms.   Pt presents with cough, shortness of breath, and he reports "it hurts to breathe" Pt has h/o MR, schizophrenia He lives in group home I spoke to his caregiver, Alycia Rossetti, and he reports the patient "ran away" from group home He reports he will frequently leave and try to go to hospital  Past Medical History  Diagnosis Date  . Hypertension   . Diabetes mellitus   . Coronary artery disease   . Heart attack     History reviewed. No pertinent past surgical history.  History reviewed. No pertinent family history.  History  Substance Use Topics  . Smoking status: Current Everyday Smoker  . Smokeless tobacco: Not on file  . Alcohol Use: No      Review of Systems  Unable to perform ROS: Psychiatric disorder  Respiratory: Positive for cough.   Cardiovascular: Positive for chest pain.    Allergies  Review of patient's allergies indicates no known allergies.  Home Medications  No current outpatient prescriptions on file. BP 148/88  Pulse 94  Temp(Src) 97.1 F (36.2 C) (Oral)  Resp 18   Physical Exam CONSTITUTIONAL: Well developed/well nourished HEAD AND FACE: Normocephalic/atraumatic EYES: EOMI/PERRL ENMT: Mucous membranes moist NECK: supple no meningeal signs SPINE:entire spine nontender CV: S1/S2  noted, no murmurs/rubs/gallops noted LUNGS:wheezing noted bilaterally but no distress noted ABDOMEN: soft, nontender, no rebound or guarding GU:no cva tenderness NEURO: Pt is awake/alert, moves all extremitiesx4 EXTREMITIES: pulses normal, full ROM SKIN: warm, color normal PSYCH: flat affect  ED Course  Procedures  6:17 AM Pt with h/o MR, schizophrenia, he left his group home which his care provider reports he does frequently Initial EKG unrevealing Will obtain CXR He is in no distress 6:56 AM Pt in no distress EKG/CXR negative Reviewed previous chart, has h/o ER visits for chest pain I spoke to his caregiver who will come and pick him up I have ordered one albuterol neb prior to his discharge    MDM  Nursing notes reviewed and considered in documentation Previous records reviewed and considered    Date: 05/30/2011  Rate: 69  Rhythm: normal sinus rhythm  QRS Axis: normal  Intervals: normal  ST/T Wave abnormalities: nonspecific ST changes  Conduction Disutrbances:none  Narrative Interpretation:   Old EKG Reviewed: unchanged          Joya Gaskins, MD 05/30/11 [phone]

## 2011-05-30 NOTE — Discharge Instructions (Signed)

## 2011-06-03 ENCOUNTER — Emergency Department (HOSPITAL_COMMUNITY): Payer: Medicare Other

## 2011-06-03 ENCOUNTER — Emergency Department (HOSPITAL_COMMUNITY)
Admission: EM | Admit: 2011-06-03 | Discharge: 2011-06-03 | Disposition: A | Discharge status: Home / Self Care | Payer: Medicare Other | Attending: Emergency Medicine | Admitting: Emergency Medicine

## 2011-06-03 ENCOUNTER — Encounter (HOSPITAL_COMMUNITY): Payer: Self-pay | Admitting: Emergency Medicine

## 2011-06-03 DIAGNOSIS — J449 Chronic obstructive pulmonary disease, unspecified: Secondary | ICD-10-CM | POA: Insufficient documentation

## 2011-06-03 DIAGNOSIS — Z79899 Other long term (current) drug therapy: Secondary | ICD-10-CM | POA: Insufficient documentation

## 2011-06-03 DIAGNOSIS — J4489 Other specified chronic obstructive pulmonary disease: Secondary | ICD-10-CM | POA: Insufficient documentation

## 2011-06-03 DIAGNOSIS — E079 Disorder of thyroid, unspecified: Secondary | ICD-10-CM | POA: Insufficient documentation

## 2011-06-03 DIAGNOSIS — Z8659 Personal history of other mental and behavioral disorders: Secondary | ICD-10-CM | POA: Insufficient documentation

## 2011-06-03 DIAGNOSIS — F79 Unspecified intellectual disabilities: Secondary | ICD-10-CM | POA: Insufficient documentation

## 2011-06-03 DIAGNOSIS — R079 Chest pain, unspecified: Secondary | ICD-10-CM

## 2011-06-03 DIAGNOSIS — I2510 Atherosclerotic heart disease of native coronary artery without angina pectoris: Secondary | ICD-10-CM | POA: Insufficient documentation

## 2011-06-03 NOTE — ED Notes (Signed)
Called group home to pick up pt

## 2011-06-03 NOTE — ED Provider Notes (Signed)
Medical screening examination/treatment/procedure(s) were conducted as a shared visit with non-physician practitioner(s) and myself.  I personally evaluated the patient during the encounter  Please see my separate respective documentation pertaining to this patient encounter   Vida Roller, MD 06/03/11 [phone]

## 2011-06-03 NOTE — ED Notes (Signed)
PER EMS- Pt states he has been having chest pain for a few days. Also c/o SOB, reports vomiting x2, radiating down left arm. Alertx4, NAD.

## 2011-06-03 NOTE — ED Notes (Signed)
X-ray at bedside

## 2011-06-03 NOTE — ED Provider Notes (Signed)
History     CSN: [id number]  Arrival date & time 06/03/11  0544   First MD Initiated Contact with Patient 06/03/11 [phone]      Chief Complaint  Patient presents with  . Chest Pain    (Consider location/radiation/quality/duration/timing/severity/associated sxs/prior treatment) Patient is a 56 y.o. male presenting with chest pain. The history is provided by the patient.  Chest Pain The chest pain began 1 - 2 hours ago. The chest pain is resolved. Pertinent negatives for primary symptoms include no fever, no shortness of breath and no abdominal pain. Associated symptoms comments: He had pain in his left hand that traveled up his arm into his chest and is now gone. No SOB, nausea, cough over his usual cough, fever. At the time he started having pain, he was being questioned by police for attempted break in of a house. No injury..     Past Medical History  Diagnosis Date  . Seizures   . Psychiatric disorder   . Schizoaffective disorder   . Organic mood disorder   . Intermittent explosive disorder   . Generalized anxiety disorder   . Mild mental retardation   . COPD (chronic obstructive pulmonary disease)   . Peptic ulcer   . Thyroid disease   . Prostate hyperplasia with urinary obstruction   . Blind left eye   . Chronic respiratory failure   . Cancer   . Hypertension   . Diabetes mellitus   . Coronary artery disease   . Heart attack     Past Surgical History  Procedure Date  . Hip pinning,cannulated 04/11/2011    Procedure: CANNULATED HIP PINNING;  Surgeon: Mable Paris, MD;  Location: [facility] OR;  Service: Orthopedics;  Laterality: Left;  Perc. Screw Fixation/Left    Family History  Problem Relation Age of Onset  . Hypertension      History  Substance Use Topics  . Smoking status: Current Everyday Smoker -- 0.2 packs/day    Types: Cigarettes  . Smokeless tobacco: Not on file  . Alcohol Use: No      Review of Systems  Constitutional: Negative for fever.    Respiratory: Negative for shortness of breath.   Cardiovascular: Positive for chest pain.  Gastrointestinal: Negative for abdominal pain.    Allergies  Peanut-containing drug products; Prolixin; Thorazine; and Lactose intolerance (gi)  Home Medications   Current Outpatient Rx  Name Route Sig Dispense Refill  . IPRATROPIUM-ALBUTEROL 18-103 MCG/ACT IN AERO Inhalation Inhale 2 puffs into the lungs every 6 (six) hours as needed. Shortness of breath.    . AMLODIPINE BESYLATE 10 MG PO TABS Oral Take 10 mg by mouth daily.      Marland Kitchen DIPHENHYDRAMINE HCL 50 MG PO CAPS Oral Take 50 mg by mouth at bedtime as needed. For insomnia    . DIVALPROEX SODIUM 125 MG PO CPSP Oral Take 6 capsules (750 mg total) by mouth every 8 (eight) hours. 90 capsule 0  . DOCUSATE SODIUM 100 MG PO CAPS Oral Take 100 mg by mouth 2 (two) times daily.    Marland Kitchen FLUTICASONE PROPIONATE  HFA 44 MCG/ACT IN AERO Inhalation Inhale 2 puffs into the lungs 2 (two) times daily.    Marland Kitchen HALOPERIDOL 2 MG PO TABS Oral Take 1 tablet (2 mg total) by mouth every 6 (six) hours as needed (aggitation). agitation 20 tablet 0  . LEVETIRACETAM 500 MG PO TABS Oral Take 1 tablet (500 mg total) by mouth 2 (two) times daily. 60 tablet 0  .  LEVOTHYROXINE SODIUM 75 MCG PO TABS Oral Take 75 mcg by mouth daily.      Marland Kitchen LITHIUM CARBONATE 300 MG PO CAPS Oral Take 300 mg by mouth 3 (three) times daily with meals.    . OMEPRAZOLE 20 MG PO CPDR Oral Take 20 mg by mouth 2 (two) times daily.      Marland Kitchen RISPERIDONE 1 MG PO TABS Oral Take 1 mg by mouth daily.    Marland Kitchen RISPERIDONE 2 MG PO TABS Oral Take 2 mg by mouth daily at 8 pm.     . TAMSULOSIN HCL 0.4 MG PO CAPS Oral Take by mouth.    . TRAZODONE HCL 100 MG PO TABS Oral Take 100 mg by mouth at bedtime.      . WARFARIN SODIUM 5 MG PO TABS Oral Take 1 tablet (5 mg total) by mouth daily at 6 PM. 15 tablet 0    BP 139/98  Pulse 58  Temp(Src) 98.5 F (36.9 C) (Oral)  Resp 15  SpO2 96%  Physical Exam  Constitutional: He is  oriented to person, place, and time. He appears well-developed and well-nourished. No distress.  Neck: Normal range of motion.  Cardiovascular: Normal rate.   No murmur heard. Pulmonary/Chest: Effort normal.       Diffuse rhonchi, mild expiratory wheezing.  Abdominal: Soft. There is no tenderness.  Musculoskeletal: Normal range of motion. He exhibits no edema.  Neurological: He is alert and oriented to person, place, and time.  Skin: Skin is warm and dry.    ED Course  Procedures (including critical care time)  Labs Reviewed - No data to display Dg Chest Portable 1 View  06/03/2011  *RADIOLOGY REPORT*  Clinical Data: Chest pain  PORTABLE CHEST - 1 VIEW  Comparison: 04/18/2011  Findings: Normal heart size and pulmonary vascularity. Emphysematous changes and fibrosis throughout the lungs.  Elevation of the left hemidiaphragm.  Tortuous aorta.  No focal airspace consolidation in the lungs.  No blunting of costophrenic angles. No pneumothorax.  Old right rib fracture.  No significant changes since the previous study.  IMPRESSION: Emphysematous changes and fibrosis in the lungs.  Original Report Authenticated By: Marlon Pel, M.D.     No diagnosis found.  1. Chest pain  MDM  The patient's x-ray is c/w history of emphysema. VSS. Patient refuses to have blood drawn or have other treatment. He has no history of heart disease. Records reviewed - all previous cardiac labs negative. EKG without acute or ischemic changes. Feel he is safe for discharge.        Rodena Medin, PA-C 06/03/11 0730

## 2011-06-03 NOTE — ED Notes (Signed)
Phlebotomy called to inform them that troponin has been changed to i-stat troponin.

## 2011-06-03 NOTE — Discharge Instructions (Signed)
YOU CAN BE DISCHARGED HOME AND SHOULD FOLLOW UP WITH YOUR DOCTOR IN 1-2 DAYS FOR RECHECK.

## 2011-06-03 NOTE — ED Notes (Signed)
Patient currently resting quietly in bed; no respiratory or acute distress noted.  Patient updated on plan of care; informed patient that we are currently waiting on lab results to come back.  Patient wanting coffee and something to eat; informed patient that we have to wait for lab results to come back first.

## 2011-06-03 NOTE — ED Notes (Signed)
Pt refuses to have labs drawn at this time, md aware

## 2011-06-03 NOTE — ED Notes (Signed)
56 year old male with a history of Past Medical History  Diagnosis Date  . Seizures   . Psychiatric disorder   . Schizoaffective disorder   . Organic mood disorder   . Intermittent explosive disorder   . Generalized anxiety disorder   . Mild mental retardation   . COPD (chronic obstructive pulmonary disease)   . Peptic ulcer   . Thyroid disease   . Prostate hyperplasia with urinary obstruction   . Blind left eye   . Chronic respiratory failure   . Cancer   . Hypertension   . Diabetes mellitus   . Coronary artery disease   . Heart attack    Who presents after he was caught breaking into somebody's house with a complaint of chest pain. He states that the pain started in his fingers and moved to his hand to his forearm to his upper arm and shoulder that his chest. It is a sharp and stabbing pain in the left side of the chest which is worse with deep breathing. He admits to smoking daily and has audible wheezing. On physical exam he has bilateral expiratory wheezing in all lung fields, speaks in full sentences, no peripheral edema. He denies any exertional symptoms.  Assessment chest pain of unknown origin, chest x-ray, nebulizer, initial labs. Doubt cardiac source but we'll get labs to ensure not coronary syndrome. The pain is described as completely atypical for an acute coronary syndrome.  Medical screening examination/treatment/procedure(s) were conducted as a shared visit with non-physician practitioner(s) and myself.  I personally evaluated the patient during the encounter    Vida Roller, MD 06/03/11 [phone]

## 2011-06-03 NOTE — ED Notes (Signed)
Patient brought into ED by EMS; patient was found breaking in someone's house -- police called.  Police called EMS because patient complaining of mid-sternal chest pain that radiates down left arm.  Patient rates pain 6/10 on the numerical pain scale; describes pain as "dull" and "pressure".  Patient given 1 nitro sublingual tablet and given 324 ASA by EMS.  EKG unremarkable.  IV started in left hand.  Patient alert and oriented x4; PERRL present.  Upon arrival to room, patient changed into gown and connected to continuous cardiac, pulse ox, and blood pressure monitor.  Will continue to monitor.

## 2011-06-07 ENCOUNTER — Emergency Department (HOSPITAL_BASED_OUTPATIENT_CLINIC_OR_DEPARTMENT_OTHER)
Admission: EM | Admit: 2011-06-07 | Discharge: 2011-06-07 | Disposition: A | Discharge status: Home / Self Care | Payer: Medicare Other | Attending: Emergency Medicine | Admitting: Emergency Medicine

## 2011-06-07 ENCOUNTER — Encounter (HOSPITAL_BASED_OUTPATIENT_CLINIC_OR_DEPARTMENT_OTHER): Payer: Self-pay | Admitting: *Deleted

## 2011-06-07 DIAGNOSIS — I2510 Atherosclerotic heart disease of native coronary artery without angina pectoris: Secondary | ICD-10-CM | POA: Insufficient documentation

## 2011-06-07 DIAGNOSIS — F17200 Nicotine dependence, unspecified, uncomplicated: Secondary | ICD-10-CM | POA: Insufficient documentation

## 2011-06-07 DIAGNOSIS — I10 Essential (primary) hypertension: Secondary | ICD-10-CM | POA: Insufficient documentation

## 2011-06-07 DIAGNOSIS — I252 Old myocardial infarction: Secondary | ICD-10-CM | POA: Insufficient documentation

## 2011-06-07 DIAGNOSIS — F70 Mild intellectual disabilities: Secondary | ICD-10-CM | POA: Insufficient documentation

## 2011-06-07 DIAGNOSIS — R200 Anesthesia of skin: Secondary | ICD-10-CM

## 2011-06-07 DIAGNOSIS — E119 Type 2 diabetes mellitus without complications: Secondary | ICD-10-CM | POA: Insufficient documentation

## 2011-06-07 DIAGNOSIS — R209 Unspecified disturbances of skin sensation: Secondary | ICD-10-CM | POA: Insufficient documentation

## 2011-06-07 DIAGNOSIS — Z79899 Other long term (current) drug therapy: Secondary | ICD-10-CM | POA: Insufficient documentation

## 2011-06-07 DIAGNOSIS — J4489 Other specified chronic obstructive pulmonary disease: Secondary | ICD-10-CM | POA: Insufficient documentation

## 2011-06-07 DIAGNOSIS — R569 Unspecified convulsions: Secondary | ICD-10-CM

## 2011-06-07 DIAGNOSIS — J449 Chronic obstructive pulmonary disease, unspecified: Secondary | ICD-10-CM | POA: Insufficient documentation

## 2011-06-07 DIAGNOSIS — G40909 Epilepsy, unspecified, not intractable, without status epilepticus: Secondary | ICD-10-CM | POA: Insufficient documentation

## 2011-06-07 DIAGNOSIS — F259 Schizoaffective disorder, unspecified: Secondary | ICD-10-CM | POA: Insufficient documentation

## 2011-06-07 LAB — BASIC METABOLIC PANEL
BUN: 7 mg/dL (ref 6–23)
CO2: 31 mEq/L (ref 19–32)
Calcium: 9.6 mg/dL (ref 8.4–10.5)
Chloride: 104 mEq/L (ref 96–112)
Creatinine, Ser: 0.8 mg/dL (ref 0.50–1.35)
GFR calc Af Amer: >90 mL/min (ref >90)
GFR calc non Af Amer: >90 mL/min (ref >90)
Glucose, Bld: 90 mg/dL (ref 70–99)
Potassium: 3.9 mEq/L (ref 3.5–5.1)
Sodium: 141 mEq/L (ref 135–145)

## 2011-06-07 LAB — CBC
HCT: 37.8 % — ABNORMAL LOW (ref 39.0–52.0)
Hemoglobin: 12.4 g/dL — ABNORMAL LOW (ref 13.0–17.0)
MCH: 26.2 pg (ref 26.0–34.0)
MCHC: 32.8 g/dL (ref 30.0–36.0)
MCV: 79.7 fL (ref 78.0–100.0)
Platelets: 320 10*3/uL (ref 150–400)
RBC: 4.74 MIL/uL (ref 4.22–5.81)
RDW: 14.1 % (ref 11.5–15.5)
WBC: 6 10*3/uL (ref 4.0–10.5)

## 2011-06-07 LAB — URINALYSIS, ROUTINE W REFLEX MICROSCOPIC
Bilirubin Urine: NEGATIVE
Glucose, UA: NEGATIVE mg/dL
Hgb urine dipstick: NEGATIVE
Ketones, ur: NEGATIVE mg/dL
Leukocytes, UA: NEGATIVE
Nitrite: NEGATIVE
Protein, ur: NEGATIVE mg/dL
Specific Gravity, Urine: 1.004 — ABNORMAL LOW (ref 1.005–1.030)
Urobilinogen, UA: 0.2 mg/dL (ref 0.0–1.0)
pH: 7 (ref 5.0–8.0)

## 2011-06-07 LAB — TROPONIN I: Troponin I: <0.3 ng/mL (ref <0.30)

## 2011-06-07 LAB — PROTIME-INR
INR: 1.05 (ref 0.00–1.49)
Prothrombin Time: 13.9 seconds (ref 11.6–15.2)

## 2011-06-07 LAB — LITHIUM LEVEL: Lithium Lvl: 0.59 mEq/L — ABNORMAL LOW (ref 0.80–1.40)

## 2011-06-07 MED ORDER — LORAZEPAM 2 MG/ML IJ SOLN
2.0000 mg | Freq: Once | INTRAMUSCULAR | Status: AC
  Administered 2011-06-07: 2 mg via INTRAMUSCULAR

## 2011-06-07 NOTE — ED Notes (Signed)
Pt had focal seizure-like activity lasting approx 3-4 mins, MD at bs during episode, O2 applied via NRB. Pt placed on cardiac monitor following activity, SR on monitor, pt able to respond to questions shortly after activity and meds given. IV placed and labs drawn. Pt asking for food and drink, able to follow commands.

## 2011-06-07 NOTE — ED Notes (Signed)
Report recd from [facility]- pt awake , alert-

## 2011-06-07 NOTE — ED Notes (Signed)
Food tray offered.

## 2011-06-07 NOTE — ED Provider Notes (Signed)
Care assumed from Dr. Juleen China at shift change.  I agree with his note, assessment, and plan.  The patient is resting comfortably and is in no distress.  The lithium level returned slightly subtherapeutic, but I believe is stable for discharge with no change in medications.  Geoffery Lyons, MD 06/07/11 [phone]

## 2011-06-07 NOTE — ED Provider Notes (Signed)
History    56ym brought in by EMS. Apparently found wandering and picked up when complained of L sided pain/numbness. Denies acute trauma but recent admit for L hip fracture. No fever or chills. No focal weakness. Says caretakers gives meds and he just takes what is given to him. Further history limited given MR.  CSN: [id number]  Arrival date & time 06/07/11  0507   None     Chief Complaint  Patient presents with  . Leg Pain    (Consider location/radiation/quality/duration/timing/severity/associated sxs/prior treatment) HPI  Past Medical History  Diagnosis Date  . Seizures   . Psychiatric disorder   . Schizoaffective disorder   . Organic mood disorder   . Intermittent explosive disorder   . Generalized anxiety disorder   . Mild mental retardation   . COPD (chronic obstructive pulmonary disease)   . Peptic ulcer   . Thyroid disease   . Prostate hyperplasia with urinary obstruction   . Blind left eye   . Chronic respiratory failure   . Cancer   . Hypertension   . Diabetes mellitus   . Coronary artery disease   . Heart attack     Past Surgical History  Procedure Date  . Hip pinning,cannulated 04/11/2011    Procedure: CANNULATED HIP PINNING;  Surgeon: Mable Paris, MD;  Location: [facility] OR;  Service: Orthopedics;  Laterality: Left;  Perc. Screw Fixation/Left    Family History  Problem Relation Age of Onset  . Hypertension      History  Substance Use Topics  . Smoking status: Current Everyday Smoker -- 0.2 packs/day    Types: Cigarettes  . Smokeless tobacco: Not on file  . Alcohol Use: No      Review of Systems   Review of symptoms negative unless otherwise noted in HPI.   Allergies  Peanut-containing drug products; Prolixin; Thorazine; and Lactose intolerance (gi)  Home Medications   Current Outpatient Rx  Name Route Sig Dispense Refill  . IPRATROPIUM-ALBUTEROL 18-103 MCG/ACT IN AERO Inhalation Inhale 2 puffs into the lungs every 6 (six)  hours as needed. Shortness of breath.    . AMLODIPINE BESYLATE 10 MG PO TABS Oral Take 10 mg by mouth daily.      Marland Kitchen DIPHENHYDRAMINE HCL 50 MG PO CAPS Oral Take 50 mg by mouth at bedtime as needed. For insomnia    . DIVALPROEX SODIUM 125 MG PO CPSP Oral Take 6 capsules (750 mg total) by mouth every 8 (eight) hours. 90 capsule 0  . DOCUSATE SODIUM 100 MG PO CAPS Oral Take 100 mg by mouth 2 (two) times daily.    Marland Kitchen FLUTICASONE PROPIONATE  HFA 44 MCG/ACT IN AERO Inhalation Inhale 2 puffs into the lungs 2 (two) times daily.    Marland Kitchen HALOPERIDOL 2 MG PO TABS Oral Take 1 tablet (2 mg total) by mouth every 6 (six) hours as needed (aggitation). agitation 20 tablet 0  . LEVETIRACETAM 500 MG PO TABS Oral Take 1 tablet (500 mg total) by mouth 2 (two) times daily. 60 tablet 0  . LEVOTHYROXINE SODIUM 75 MCG PO TABS Oral Take 75 mcg by mouth daily.      Marland Kitchen LITHIUM CARBONATE 300 MG PO CAPS Oral Take 300 mg by mouth 3 (three) times daily with meals.    . OMEPRAZOLE 20 MG PO CPDR Oral Take 20 mg by mouth 2 (two) times daily.      Marland Kitchen RISPERIDONE 1 MG PO TABS Oral Take 1 mg by mouth daily.    Marland Kitchen  RISPERIDONE 2 MG PO TABS Oral Take 2 mg by mouth daily at 8 pm.     . TAMSULOSIN HCL 0.4 MG PO CAPS Oral Take by mouth.    . TRAZODONE HCL 100 MG PO TABS Oral Take 100 mg by mouth at bedtime.      . WARFARIN SODIUM 5 MG PO TABS Oral Take 1 tablet (5 mg total) by mouth daily at 6 PM. 15 tablet 0    BP 142/97  Pulse 83  Temp(Src) 97.6 F (36.4 C) (Oral)  Resp 18  SpO2 97%  Physical Exam  Nursing note and vitals reviewed. Constitutional: No distress.  HENT:  Head: Normocephalic and atraumatic.       edentulous  Eyes: EOM are normal. Right eye exhibits no discharge. Left eye exhibits no discharge. No scleral icterus.       L pupil does not react to light direct but does consensually.   Neck: Neck supple.  Cardiovascular: Normal rate and regular rhythm.   Pulmonary/Chest: Effort normal.       Coarse breath sounds b/l    Abdominal: Soft. He exhibits no distension. There is no tenderness.  Musculoskeletal: Normal range of motion. He exhibits no edema and no tenderness.       No external signs of acute trauma. No midline tenderness, chest wall or extremities. No significant pain with ROM of large joints.  Lymphadenopathy:    He has no cervical adenopathy.  Neurological: He is alert. He exhibits normal muscle tone. Coordination normal.  Skin: Skin is warm and dry. He is not diaphoretic.    ED Course  Procedures (including critical care time)  Labs Reviewed - No data to display No results found.  EKG:  Rhythm: normal sinus Rate: 68 Axis: borderline right Intervals/Conduction: septal q waves ST segments:  1. Numbness   2. Seizure-like activity       MDM  56yM L arm numbness and questionable seizure in ED. No incontinence and quick return to baseline. Per review of records, pt with previous neurology evaluations with most recent eval stating that "his events are clearly non-organic." Aside from eye findings, neuro exam nonfocal. Pt with hx of L eye blindness though. W/u reassuring. INR normal. Med list with warfarin. This appears to have been prophylactic after hip surgery. Given almost two months out and ambulatory status should not need continued VTE prophylaxis. Caretaker would like pt to be discharged. At this time I think this is reasonable, but lithium level pending.          Raeford Razor, MD 06/08/11 [phone]

## 2011-06-07 NOTE — ED Notes (Signed)
Spoke with pt's caretaker, Nonnie Done, who states that past was supposed to be taken to [facility] by EMS for evaluation. Caretaker also stated that pt has history of faking seizures and has been prescribed two different seizure meds but pt has not been given them to take d/t no true seizure history. Caretaker asking to be informed when pt was ready for discharge and for pt not to be admitted to inpt setting. MD made aware.

## 2011-06-07 NOTE — ED Notes (Signed)
Pt brought here by EMS for leg numbness. Pt was ambulatory in dept. Pt lives in a home with a caretaker.

## 2011-06-07 NOTE — ED Notes (Signed)
PO down to 88% on room air, pt placed on O2@2L  via n/c, PO up to 94%, resps even and unlabored.

## 2011-06-08 LAB — GLUCOSE, CAPILLARY: Glucose-Capillary: 89 mg/dL (ref 70–99)

## 2011-06-09 ENCOUNTER — Emergency Department (HOSPITAL_COMMUNITY)
Admission: EM | Admit: 2011-06-09 | Discharge: 2011-06-10 | Disposition: A | Discharge status: Home / Self Care | Payer: Medicare Other | Attending: Emergency Medicine | Admitting: Emergency Medicine

## 2011-06-09 ENCOUNTER — Encounter (HOSPITAL_COMMUNITY): Payer: Self-pay | Admitting: Emergency Medicine

## 2011-06-09 DIAGNOSIS — J4489 Other specified chronic obstructive pulmonary disease: Secondary | ICD-10-CM | POA: Insufficient documentation

## 2011-06-09 DIAGNOSIS — R0602 Shortness of breath: Secondary | ICD-10-CM | POA: Insufficient documentation

## 2011-06-09 DIAGNOSIS — R569 Unspecified convulsions: Secondary | ICD-10-CM

## 2011-06-09 DIAGNOSIS — E079 Disorder of thyroid, unspecified: Secondary | ICD-10-CM | POA: Insufficient documentation

## 2011-06-09 DIAGNOSIS — I2510 Atherosclerotic heart disease of native coronary artery without angina pectoris: Secondary | ICD-10-CM | POA: Insufficient documentation

## 2011-06-09 DIAGNOSIS — Z79899 Other long term (current) drug therapy: Secondary | ICD-10-CM | POA: Insufficient documentation

## 2011-06-09 DIAGNOSIS — E119 Type 2 diabetes mellitus without complications: Secondary | ICD-10-CM | POA: Insufficient documentation

## 2011-06-09 DIAGNOSIS — Z8659 Personal history of other mental and behavioral disorders: Secondary | ICD-10-CM | POA: Insufficient documentation

## 2011-06-09 DIAGNOSIS — J449 Chronic obstructive pulmonary disease, unspecified: Secondary | ICD-10-CM

## 2011-06-09 DIAGNOSIS — R0789 Other chest pain: Secondary | ICD-10-CM | POA: Insufficient documentation

## 2011-06-09 DIAGNOSIS — T7840XA Allergy, unspecified, initial encounter: Secondary | ICD-10-CM

## 2011-06-09 MED ORDER — ALBUTEROL SULFATE (5 MG/ML) 0.5% IN NEBU
2.5000 mg | INHALATION_SOLUTION | Freq: Once | RESPIRATORY_TRACT | Status: AC
Start: 2011-06-10 — End: 2011-06-09
  Administered 2011-06-09: 2.5 mg via RESPIRATORY_TRACT
  Filled 2011-06-09: qty 0.5

## 2011-06-09 MED ORDER — PREDNISONE 20 MG PO TABS
60.0000 mg | ORAL_TABLET | Freq: Once | ORAL | Status: AC
  Administered 2011-06-09: 60 mg via ORAL
  Filled 2011-06-09: qty 3

## 2011-06-09 NOTE — ED Notes (Signed)
Per EMS, pt started having CP today around 8 AM. CP is a pressure radiating to left arm. No n/v or diaphoresis. Pain is 4 out of 10. Comes from group home. Last seen in the ED on Wednesday for same complaint. 12lead NSR. Meds: 324 ASA. Vitals:  152/90, 82, 98% on 2L N/C, 18. 18 g RAC.

## 2011-06-09 NOTE — ED Notes (Signed)
EDP at bedside  

## 2011-06-10 ENCOUNTER — Emergency Department (HOSPITAL_COMMUNITY): Payer: Medicare Other

## 2011-06-10 ENCOUNTER — Encounter (HOSPITAL_COMMUNITY): Payer: Self-pay | Admitting: *Deleted

## 2011-06-10 ENCOUNTER — Emergency Department (HOSPITAL_COMMUNITY)
Admission: EM | Admit: 2011-06-10 | Discharge: 2011-06-12 | Disposition: A | Discharge status: Skilled Nursing Facility | Payer: Medicare Other | Attending: Emergency Medicine | Admitting: Emergency Medicine

## 2011-06-10 DIAGNOSIS — J449 Chronic obstructive pulmonary disease, unspecified: Secondary | ICD-10-CM | POA: Insufficient documentation

## 2011-06-10 DIAGNOSIS — R45851 Suicidal ideations: Secondary | ICD-10-CM | POA: Insufficient documentation

## 2011-06-10 DIAGNOSIS — Z8659 Personal history of other mental and behavioral disorders: Secondary | ICD-10-CM | POA: Insufficient documentation

## 2011-06-10 DIAGNOSIS — R4689 Other symptoms and signs involving appearance and behavior: Secondary | ICD-10-CM

## 2011-06-10 DIAGNOSIS — E079 Disorder of thyroid, unspecified: Secondary | ICD-10-CM | POA: Insufficient documentation

## 2011-06-10 DIAGNOSIS — F603 Borderline personality disorder: Secondary | ICD-10-CM | POA: Insufficient documentation

## 2011-06-10 DIAGNOSIS — I10 Essential (primary) hypertension: Secondary | ICD-10-CM | POA: Insufficient documentation

## 2011-06-10 DIAGNOSIS — J4489 Other specified chronic obstructive pulmonary disease: Secondary | ICD-10-CM | POA: Insufficient documentation

## 2011-06-10 DIAGNOSIS — R079 Chest pain, unspecified: Secondary | ICD-10-CM | POA: Insufficient documentation

## 2011-06-10 DIAGNOSIS — Z79899 Other long term (current) drug therapy: Secondary | ICD-10-CM | POA: Insufficient documentation

## 2011-06-10 DIAGNOSIS — E119 Type 2 diabetes mellitus without complications: Secondary | ICD-10-CM | POA: Insufficient documentation

## 2011-06-10 LAB — CARDIAC PANEL(CRET KIN+CKTOT+MB+TROPI)
CK, MB: 2.8 ng/mL (ref 0.3–4.0)
Relative Index: INVALID (ref 0.0–2.5)
Total CK: 82 U/L (ref 7–232)
Troponin I: <0.3 ng/mL (ref <0.30)

## 2011-06-10 LAB — RAPID URINE DRUG SCREEN, HOSP PERFORMED
Amphetamines: NOT DETECTED
Cocaine: NOT DETECTED
Opiates: NOT DETECTED
Tetrahydrocannabinol: NOT DETECTED

## 2011-06-10 LAB — DIFFERENTIAL
Basophils Absolute: 0 10*3/uL (ref 0.0–0.1)
Basophils Relative: 1 % (ref 0–1)
Eosinophils Absolute: 0.7 10*3/uL (ref 0.0–0.7)
Neutro Abs: 4.2 10*3/uL (ref 1.7–7.7)
Neutrophils Relative %: 57 % (ref 43–77)

## 2011-06-10 LAB — CBC
MCH: 25.3 pg — ABNORMAL LOW (ref 26.0–34.0)
MCHC: 31.7 g/dL (ref 30.0–36.0)
RDW: 14 % (ref 11.5–15.5)

## 2011-06-10 LAB — BASIC METABOLIC PANEL
BUN: 7 mg/dL (ref 6–23)
Chloride: 104 mEq/L (ref 96–112)
Creatinine, Ser: 0.78 mg/dL (ref 0.50–1.35)
GFR calc Af Amer: >90 mL/min (ref >90)
GFR calc non Af Amer: >90 mL/min (ref >90)
Potassium: 3.6 mEq/L (ref 3.5–5.1)

## 2011-06-10 MED ORDER — LITHIUM CARBONATE 300 MG PO CAPS
300.0000 mg | ORAL_CAPSULE | Freq: Three times a day (TID) | ORAL | Status: DC
  Administered 2011-06-10 – 2011-06-12 (×6): 300 mg via ORAL
  Filled 2011-06-10 (×7): qty 1

## 2011-06-10 MED ORDER — TAMSULOSIN HCL 0.4 MG PO CAPS
0.4000 mg | ORAL_CAPSULE | Freq: Every day | ORAL | Status: DC
  Administered 2011-06-10 – 2011-06-11 (×2): 0.4 mg via ORAL
  Filled 2011-06-10 (×2): qty 1

## 2011-06-10 MED ORDER — ALBUTEROL SULFATE HFA 108 (90 BASE) MCG/ACT IN AERS: 2.0000 | INHALATION_SPRAY | Freq: Four times a day (QID) | RESPIRATORY_TRACT | Status: DC | PRN

## 2011-06-10 MED ORDER — ALBUTEROL SULFATE (5 MG/ML) 0.5% IN NEBU
2.5000 mg | INHALATION_SOLUTION | Freq: Once | RESPIRATORY_TRACT | Status: AC
  Administered 2011-06-10: 2.5 mg via RESPIRATORY_TRACT

## 2011-06-10 MED ORDER — FAMOTIDINE IN NACL 20-0.9 MG/50ML-% IV SOLN
INTRAVENOUS | Status: AC
  Filled 2011-06-10: qty 50

## 2011-06-10 MED ORDER — LEVOTHYROXINE SODIUM 75 MCG PO TABS
75.0000 ug | ORAL_TABLET | Freq: Every day | ORAL | Status: DC
  Administered 2011-06-10 – 2011-06-12 (×3): 75 ug via ORAL
  Filled 2011-06-10 (×3): qty 1

## 2011-06-10 MED ORDER — ALBUTEROL SULFATE HFA 108 (90 BASE) MCG/ACT IN AERS
2.0000 | INHALATION_SPRAY | Freq: Four times a day (QID) | RESPIRATORY_TRACT | Status: DC | PRN
  Administered 2011-06-10 (×2): 2 via RESPIRATORY_TRACT
  Filled 2011-06-10: qty 6.7

## 2011-06-10 MED ORDER — ALUM & MAG HYDROXIDE-SIMETH 200-200-20 MG/5ML PO SUSP: 30.0000 mL | ORAL | Status: DC | PRN

## 2011-06-10 MED ORDER — FAMOTIDINE 20 MG PO TABS: 20.0000 mg | ORAL_TABLET | Freq: Two times a day (BID) | ORAL | Status: DC

## 2011-06-10 MED ORDER — EPINEPHRINE 0.3 MG/0.3ML IJ DEVI
0.3000 mg | Freq: Once | INTRAMUSCULAR | Status: AC
  Administered 2011-06-10: 0.3 mg via INTRAMUSCULAR
  Filled 2011-06-10 (×2): qty 0.3

## 2011-06-10 MED ORDER — ONDANSETRON HCL 8 MG PO TABS: 4.0000 mg | ORAL_TABLET | Freq: Three times a day (TID) | ORAL | Status: DC | PRN

## 2011-06-10 MED ORDER — TRAZODONE HCL 100 MG PO TABS
100.0000 mg | ORAL_TABLET | Freq: Every day | ORAL | Status: DC
  Administered 2011-06-10 – 2011-06-11 (×2): 100 mg via ORAL
  Filled 2011-06-10 (×2): qty 1

## 2011-06-10 MED ORDER — ALBUTEROL SULFATE (5 MG/ML) 0.5% IN NEBU
INHALATION_SOLUTION | RESPIRATORY_TRACT | Status: AC
  Administered 2011-06-10: 2.5 mg
  Filled 2011-06-10: qty 1

## 2011-06-10 MED ORDER — DIPHENHYDRAMINE HCL 50 MG/ML IJ SOLN
25.0000 mg | Freq: Once | INTRAMUSCULAR | Status: AC
  Administered 2011-06-10: 25 mg via INTRAVENOUS

## 2011-06-10 MED ORDER — NICOTINE 21 MG/24HR TD PT24
21.0000 mg | MEDICATED_PATCH | Freq: Every day | TRANSDERMAL | Status: DC
  Administered 2011-06-10 – 2011-06-12 (×3): 21 mg via TRANSDERMAL
  Filled 2011-06-10 (×3): qty 1

## 2011-06-10 MED ORDER — ACETAMINOPHEN 325 MG PO TABS: 650.0000 mg | ORAL_TABLET | ORAL | Status: DC | PRN

## 2011-06-10 MED ORDER — FLUTICASONE PROPIONATE HFA 44 MCG/ACT IN AERO
2.0000 | INHALATION_SPRAY | Freq: Two times a day (BID) | RESPIRATORY_TRACT | Status: DC
Start: 2011-06-10 — End: 2011-06-12
  Administered 2011-06-10 – 2011-06-12 (×4): 2 via RESPIRATORY_TRACT
  Filled 2011-06-10 (×2): qty 10.6

## 2011-06-10 MED ORDER — FAMOTIDINE IN NACL 20-0.9 MG/50ML-% IV SOLN
20.0000 mg | Freq: Once | INTRAVENOUS | Status: AC
  Administered 2011-06-10: 20 mg via INTRAVENOUS

## 2011-06-10 MED ORDER — RISPERIDONE 1 MG PO TABS
1.0000 mg | ORAL_TABLET | Freq: Every day | ORAL | Status: DC
Start: 2011-06-10 — End: 2011-06-12
  Administered 2011-06-10 – 2011-06-12 (×3): 1 mg via ORAL
  Filled 2011-06-10 (×3): qty 1

## 2011-06-10 MED ORDER — PREDNISONE 20 MG PO TABS: 60.0000 mg | ORAL_TABLET | Freq: Every day | ORAL | Status: DC

## 2011-06-10 MED ORDER — IBUPROFEN 200 MG PO TABS
600.0000 mg | ORAL_TABLET | Freq: Three times a day (TID) | ORAL | Status: DC | PRN
  Administered 2011-06-11: 600 mg via ORAL
  Filled 2011-06-10: qty 3

## 2011-06-10 MED ORDER — DIPHENHYDRAMINE HCL 25 MG PO TABS: 25.0000 mg | ORAL_TABLET | Freq: Four times a day (QID) | ORAL | Status: DC

## 2011-06-10 MED ORDER — ZOLPIDEM TARTRATE 5 MG PO TABS: 5.0000 mg | ORAL_TABLET | Freq: Every evening | ORAL | Status: DC | PRN

## 2011-06-10 MED ORDER — DIPHENHYDRAMINE HCL 50 MG/ML IJ SOLN
INTRAMUSCULAR | Status: AC
  Filled 2011-06-10: qty 1

## 2011-06-10 MED ORDER — TEMAZEPAM 15 MG PO CAPS
15.0000 mg | ORAL_CAPSULE | Freq: Every day | ORAL | Status: DC
  Administered 2011-06-10 – 2011-06-11 (×2): 15 mg via ORAL
  Filled 2011-06-10 (×2): qty 1

## 2011-06-10 NOTE — ED Notes (Signed)
[employer] and [employer] at bedside. House Coverage and myself have both spoken with pt regarding his behavior and what is expected of him while he is here in the hospital. Pt verbalized understanding and agreed to be cooperative and nonviolent towards staff. There is a sitter at the bedside at this time. Pt remains tearful.

## 2011-06-10 NOTE — ED Notes (Signed)
Called into pt. Room by sitter. Pt. Right arm shaking, hitting left arm against side rail, staring off to side. Pt. Not responding to verbal stimulation. Withdrawal slightly to pain, eyes moving, resist arm extension.  After approx.1 minute pt. Alert and talking. Stated "I was watching basketball and I felt it coming on". Stated he feels a lot better now. Physician notified.

## 2011-06-10 NOTE — ED Notes (Signed)
Respiratory called

## 2011-06-10 NOTE — Discharge Instructions (Signed)
Please discuss with your doctor if you are supposed to be taking Coumadin at this time. Use your inhalers as prescribed. Take steroids for the next 5 days along with pepcid and benadryl. Return to the emergency department for worsening condition or new concerning symptoms.  Chest Pain (Nonspecific) It is often hard to give a specific diagnosis for the cause of chest pain. There is always a chance that your pain could be related to something serious, such as a heart attack or a blood clot in the lungs. You need to follow up with your caregiver for further evaluation. CAUSES   Heartburn.   Pneumonia or bronchitis.   Anxiety or stress.   Inflammation around your heart (pericarditis) or lung (pleuritis or pleurisy).   A blood clot in the lung.   A collapsed lung (pneumothorax). It can develop suddenly on its own (spontaneous pneumothorax) or from injury (trauma) to the chest.   Shingles infection (herpes zoster virus).  The chest wall is composed of bones, muscles, and cartilage. Any of these can be the source of the pain.  The bones can be bruised by injury.   The muscles or cartilage can be strained by coughing or overwork.   The cartilage can be affected by inflammation and become sore (costochondritis).  DIAGNOSIS  Lab tests or other studies, such as X-rays, electrocardiography, stress testing, or cardiac imaging, may be needed to find the cause of your pain.  TREATMENT   Treatment depends on what may be causing your chest pain. Treatment may include:   Acid blockers for heartburn.   Anti-inflammatory medicine.   Pain medicine for inflammatory conditions.   Antibiotics if an infection is present.   You may be advised to change lifestyle habits. This includes stopping smoking and avoiding alcohol, caffeine, and chocolate.   You may be advised to keep your head raised (elevated) when sleeping. This reduces the chance of acid going backward from your stomach into your  esophagus.   Most of the time, nonspecific chest pain will improve within 2 to 3 days with rest and mild pain medicine.  HOME CARE INSTRUCTIONS   If antibiotics were prescribed, take your antibiotics as directed. Finish them even if you start to feel better.   For the next few days, avoid physical activities that bring on chest pain. Continue physical activities as directed.   Do not smoke.   Avoid drinking alcohol.   Only take over-the-counter or prescription medicine for pain, discomfort, or fever as directed by your caregiver.   Follow your caregiver's suggestions for further testing if your chest pain does not go away.   Keep any follow-up appointments you made. If you do not go to an appointment, you could develop lasting (chronic) problems with pain. If there is any problem keeping an appointment, you must call to reschedule.  SEEK MEDICAL CARE IF:   You think you are having problems from the medicine you are taking. Read your medicine instructions carefully.   Your chest pain does not go away, even after treatment.   You develop a rash with blisters on your chest.  SEEK IMMEDIATE MEDICAL CARE IF:   You have increased chest pain or pain that spreads to your arm, neck, jaw, back, or abdomen.   You develop shortness of breath, an increasing cough, or you are coughing up blood.   You have severe back or abdominal pain, feel nauseous, or vomit.   You develop severe weakness, fainting, or chills.   You have a  fever.  THIS IS AN EMERGENCY. Do not wait to see if the pain will go away. Get medical help at once. Call your local emergency services (911 in [place] Do not drive yourself to the hospital. MAKE SURE YOU:   Understand these instructions.   Will watch your condition.   Will get help right away if you are not doing well or get worse.  Document Released: 09/27/2004 Document Revised: 12/07/2010 Document Reviewed: 07/24/2007 [facility] Patient Information 2012 [place],  [place].  Chronic Obstructive Pulmonary Disease Chronic obstructive pulmonary disease (COPD) is a lung disease. The lungs become damaged, making it hard to get air in and out of your lungs. The damage to your lungs cannot be changed.  HOME CARE  Stop smoking if you smoke. Avoid secondhand smoke.   Only take medicine as told by your doctor.   Talk to your doctor about using cough syrup or over-the-counter medicines.   Drink enough fluids to keep your pee (urine) clear or pale yellow.   Use a humidifier or vaporizer. This may help loosen the thick spit (mucus).   Talk to your doctor about vaccines that help prevent other lung problems (pneumonia and flu vaccines).   Use home oxygen as told by your doctor.   Stay active and exercise.   Eat healthy foods.  GET HELP RIGHT AWAY IF:   Your heart is beating fast.   You become disturbed, confused, shake, or are dazed.   You have trouble breathing.   You have chest pain.   You have a fever.   You cough up thick spit that is yellowish-white or green.   Your breathing becomes worse when you exercise.   You are running out of the medicine you take for your breathing.  MAKE SURE YOU:   Understand these instructions.   Will watch your condition.   Will get help right away if you are not doing well or get worse.  Document Released: 06/06/2007 Document Revised: 12/07/2010 Document Reviewed: 02/17/2010 [facility] Patient Information 2012 [place], [place].  Allergic Reaction Allergic reactions can be caused by anything your body is sensitive to. Your body may be sensitive to food, medicines, molds, pollens, cockroaches, dust mites, pets, insect stings, and other things around you. An allergic reaction may cause puffiness (swelling), itching, sneezing, coughing, or problems breathing.  Allergies cannot be cured, but they can be controlled with medicine. Some allergies happen only at certain times of the year. Try to stay away from what  causes your reaction if possible. Sometimes, it is hard to tell what causes your reaction. HOME CARE If you have a rash or red patches (hives) on your skin:  Take medicines as told by your doctor.   Do not drive or drink alcohol after taking medicines. They can make you sleepy.   Put cold cloths on your skin. Take baths in cool water. This will help your itching. Do not take hot baths or showers. Heat will make the itching worse.   If your allergies get worse, your doctor might give you other medicines. Talk to your doctor if problems continue.  GET HELP RIGHT AWAY IF:   You have trouble breathing.   You have a tight feeling in your chest or throat.   Your mouth gets puffy (swollen).   You have red, itchy patches on your skin (hives) that get worse.   You have itching all over your body.  MAKE SURE YOU:   Understand these instructions.   Will watch your condition.  Will get help right away if you are not doing well or get worse.  Document Released: 12/06/2008 Document Revised: 12/07/2010 Document Reviewed: 12/06/2008 [facility] Patient Information 2012 [place], [place].

## 2011-06-10 NOTE — ED Notes (Signed)
Telepysch consult recommendation includes inpatient psych care.

## 2011-06-10 NOTE — ED Notes (Signed)
Pt. C/o SOB and left arm/chest pain. Pt. Wheezing. Pt. Put on 2 liters O2, EKG done, physician notified. Albuteral 2 puffs given.

## 2011-06-10 NOTE — ED Notes (Signed)
Pt requested water. Asked Dr. Norlene Campbell and she stated he cannot have any at this time. Informed Pt. Will continue to monitor,

## 2011-06-10 NOTE — ED Notes (Signed)
Pt was given a meal. Okayed by Dr. Norlene Campbell. However, pt is now complaining that his throat and tongue are swelling. Placed on monitor and saturations are 100%. Pt able to talk with [place]. Tongue slightly swollen. No swelling in throat noted. Dr. Norlene Campbell made aware.
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
## 2011-06-10 NOTE — ED Notes (Signed)
Pt reports 2 week hx of "evil thoughts." states his mind is not working right. States he wants to hurt people and himself. Pt reports suicidal ideation with no plan. Pt states he wants help with his mind, it just isn't right. Pt reports agitation and being very sad.

## 2011-06-10 NOTE — ED Notes (Signed)
Information sent to telepsych
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
## 2011-06-10 NOTE — ED Notes (Signed)
Spoke with Service Response (Jonathan) and ordered regular no sharps lunch tray 

## 2011-06-10 NOTE — ED Provider Notes (Signed)
Medical screening examination/treatment/procedure(s) were performed by non-physician practitioner and as supervising physician I was immediately available for consultation/collaboration.   Dione Booze, MD 06/10/11 [phone]

## 2011-06-10 NOTE — ED Notes (Signed)
Pt is resting. No cardiac or respiratory distress. Will continue to monitor.

## 2011-06-10 NOTE — ED Notes (Signed)
Dr. Norlene Campbell made aware. Pt placed on 2L N/C

## 2011-06-10 NOTE — ED Notes (Signed)
PA at bedside.

## 2011-06-10 NOTE — ED Notes (Signed)
Dr. Norlene Campbell made aware that pt is still having CP. Will continue to monitor.

## 2011-06-10 NOTE — ED Notes (Signed)
Meal tray ordered 

## 2011-06-10 NOTE — ED Notes (Signed)
Pt resting in stretcher in NAD, respirations even and unlabored.  Sitter at bedside.

## 2011-06-10 NOTE — ED Notes (Signed)
Spoke with Marcelino Duster for [employer], telephone call made and paperwork faxed

## 2011-06-10 NOTE — BHH Counselor (Signed)
Obtained guardian phone number from SW.  Called guardian and left a message in regards to the pt. Being in the ED for SI.

## 2011-06-10 NOTE — BHH Counselor (Signed)
Pt was denied at [facility] due to MR and acuity; in order to explore [facility] and [facility] will need verification of MR

## 2011-06-10 NOTE — BHH Counselor (Signed)
tele psych results were received indicating a referral for inpatient psych treatment for pt; referred pt to [facility] for review

## 2011-06-10 NOTE — ED Provider Notes (Addendum)
History     CSN: [id number]  Arrival date & time 06/09/11  2324   First MD Initiated Contact with Patient 06/09/11 2332      Chief Complaint  Patient presents with  . Chest Pain    (Consider location/radiation/quality/duration/timing/severity/associated sxs/prior treatment) HPI 56 year old male presents to the emergency department complaining of left-sided chest pain radiating down his left arm. Patient reports increased cough and shortness of breath. Patient reports symptoms feel like his prior pneumonias. Patient reports symptoms of going on for last 3 days. He denies fevers. Of note, patient seen in the emergency department 2 days ago complaining of left-sided pain and numbness. Patient with history of MR, is a difficult historian. Patient demanding food and coffee, reports he has not eaten since breakfast because he hasn't felt hungry all day. Patient reports pain worse with movement and palpation of the left side of his arm and chest. He denies nausea, diaphoresis.  Past Medical History  Diagnosis Date  . Seizures   . Psychiatric disorder   . Schizoaffective disorder   . Organic mood disorder   . Intermittent explosive disorder   . Generalized anxiety disorder   . Mild mental retardation   . COPD (chronic obstructive pulmonary disease)   . Peptic ulcer   . Thyroid disease   . Prostate hyperplasia with urinary obstruction   . Blind left eye   . Chronic respiratory failure   . Cancer   . Hypertension   . Diabetes mellitus   . Coronary artery disease   . Heart attack     Past Surgical History  Procedure Date  . Hip pinning,cannulated 04/11/2011    Procedure: CANNULATED HIP PINNING;  Surgeon: Mable Paris, MD;  Location: [facility] OR;  Service: Orthopedics;  Laterality: Left;  Perc. Screw Fixation/Left    Family History  Problem Relation Age of Onset  . Hypertension      History  Substance Use Topics  . Smoking status: Current Everyday Smoker -- 0.2 packs/day      Types: Cigarettes  . Smokeless tobacco: Not on file  . Alcohol Use: No      Review of Systems  Unable to perform ROS: Psychiatric disorder    Allergies  Peanut-containing drug products; Prolixin; Thorazine; and Lactose intolerance (gi)  Home Medications   Current Outpatient Rx  Name Route Sig Dispense Refill  . IPRATROPIUM-ALBUTEROL 18-103 MCG/ACT IN AERO Inhalation Inhale 2 puffs into the lungs every 6 (six) hours as needed. Shortness of breath.    . AMLODIPINE BESYLATE 10 MG PO TABS Oral Take 10 mg by mouth daily.      Marland Kitchen DIPHENHYDRAMINE HCL 50 MG PO CAPS Oral Take 50 mg by mouth at bedtime as needed. For insomnia    . DIVALPROEX SODIUM 125 MG PO CPSP Oral Take 6 capsules (750 mg total) by mouth every 8 (eight) hours. 90 capsule 0  . DOCUSATE SODIUM 100 MG PO CAPS Oral Take 100 mg by mouth 2 (two) times daily.    Marland Kitchen FLUTICASONE PROPIONATE  HFA 44 MCG/ACT IN AERO Inhalation Inhale 2 puffs into the lungs 2 (two) times daily.    Marland Kitchen HALOPERIDOL 2 MG PO TABS Oral Take 1 tablet (2 mg total) by mouth every 6 (six) hours as needed (aggitation). agitation 20 tablet 0  . LEVETIRACETAM 500 MG PO TABS Oral Take 1 tablet (500 mg total) by mouth 2 (two) times daily. 60 tablet 0  . LEVOTHYROXINE SODIUM 75 MCG PO TABS Oral Take 75 mcg by  mouth daily.      Marland Kitchen LITHIUM CARBONATE 300 MG PO CAPS Oral Take 300 mg by mouth 3 (three) times daily with meals.    . OMEPRAZOLE 20 MG PO CPDR Oral Take 20 mg by mouth 2 (two) times daily.      Marland Kitchen RISPERIDONE 1 MG PO TABS Oral Take 1 mg by mouth daily.    Marland Kitchen RISPERIDONE 2 MG PO TABS Oral Take 2 mg by mouth daily at 8 pm.     . TAMSULOSIN HCL 0.4 MG PO CAPS Oral Take by mouth.    . TRAZODONE HCL 100 MG PO TABS Oral Take 100 mg by mouth at bedtime.      . WARFARIN SODIUM 5 MG PO TABS Oral Take 1 tablet (5 mg total) by mouth daily at 6 PM. 15 tablet 0    BP 148/76  Pulse 84  Temp(Src) 97.6 F (36.4 C) (Oral)  Resp 20  SpO2 100%  Physical Exam  Nursing note  and vitals reviewed. Constitutional: He is oriented to person, place, and time.       Chronically ill appearing  HENT:  Right Ear: External ear normal.  Left Ear: External ear normal.  Mouth/Throat: No oropharyngeal exudate.       Edentulous  Neck: Normal range of motion. Neck supple. No JVD present. No tracheal deviation present. No thyromegaly present.  Cardiovascular: Normal rate, regular rhythm, normal heart sounds and intact distal pulses.  Exam reveals no gallop and no friction rub.   No murmur heard. Pulmonary/Chest: No stridor. No respiratory distress. He has wheezes. He has rales. He exhibits tenderness.  Abdominal: Soft. Bowel sounds are normal. He exhibits no distension and no mass. There is no tenderness. There is no rebound and no guarding.  Musculoskeletal: He exhibits tenderness (with palpation along entire left arm and with ROM of arm). He exhibits no edema.  Lymphadenopathy:    He has no cervical adenopathy.  Neurological: He is alert and oriented to person, place, and time. He exhibits normal muscle tone. Coordination normal.  Skin: Skin is warm and dry. No rash noted. No erythema. No pallor.    ED Course  Procedures (including critical care time)   Date: 06/09/2011  Rate: 93  Rhythm: normal sinus rhythm  QRS Axis: normal  Intervals: normal  ST/T Wave abnormalities: normal  Conduction Disutrbances:none  Narrative Interpretation: q waves in anterior leads   Old EKG Reviewed: unchanged  Labs Reviewed  CBC - Abnormal; Notable for the following:    Hemoglobin 11.6 (*)    HCT 36.6 (*)    MCH 25.3 (*)    All other components within normal limits  DIFFERENTIAL - Abnormal; Notable for the following:    Eosinophils Relative 10 (*)    All other components within normal limits  CARDIAC PANEL(CRET KIN+CKTOT+MB+TROPI)  BASIC METABOLIC PANEL   Dg Chest Portable 1 View  06/10/2011  *RADIOLOGY REPORT*  Clinical Data: Chest pain  PORTABLE CHEST - 1 VIEW  Comparison:  06/03/2011  Findings: Coarse interstitial markings and hyperinflation is similar to prior.  Stable cardiomediastinal contours.  No pneumothorax. Mild left lung base opacity.  No new consolidation. Lung bases are partially obscured, including by hemidiaphragm elevation on the left, limiting evaluation.  No acute osseous finding.  IMPRESSION: COPD without definite evidence for acute cardiopulmonary process.  Original Report Authenticated By: Waneta Martins, M.D.     1. Atypical chest pain   2. Seizure-like activity   3. COPD (chronic obstructive pulmonary  disease)       MDM  56 year old male who presents with cough, left-sided chest pain with radiation down left arm. Patient does have history of coronary artery disease, COPD. Patient frequent visitor to the emergency department no changes seen on EKG. Patient did have seizure-like episode while he was in the emergency department, responded well to ammonia capsule. Will get cardiac markers, chest x-ray and treat with albuterol       2:44 AM  No acute findings on exam aside from COPD exacerbation.  Pt med list includes coumadin, but this appears to be hold over from prophylaxis post surgery in April.  No found h/o PE or DVT.  Unclear if patient is taking this. Will allow patient to eat as he has been requesting since arriving in the ER and will d/c back to his group home with steroid burst.  Olivia Mackie, MD 06/10/11 0245  Olivia Mackie, MD 06/10/11 0246  Just prior to d/c, pt c/o tongue and throat swelling after eating peanut butter.  Pt reports allergy to peanuts.  Pt given epi im, benadryl, pepcid, had already had steroids.  Pt with slight swelling to tongue, gagging on saliva, no stridor.  Pt now back to baseline, requesting breakfast and water.  Will d/c back to group home.  Olivia Mackie, MD 06/10/11 [phone]

## 2011-06-10 NOTE — ED Notes (Signed)
Saw pt. Ambulate to bathroom with tech.
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
## 2011-06-10 NOTE — ED Notes (Signed)
ACT team at bedside for assessment. 

## 2011-06-10 NOTE — ED Notes (Signed)
Assisted pt with turning and getting comfortable in bed. He is alert and oriented. No cardiac or respiratory distress. Will continue to monitor

## 2011-06-10 NOTE — ED Notes (Signed)
Dr Leta Speller pt to be discharged. No cardiac or respiratory distress.  However, Dr. Norlene Campbell stated that he still cannot have anything to drink. Informed pt. Will continue to monitor.
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
## 2011-06-10 NOTE — ED Notes (Signed)
Pt having intermittent seizure like activity. Dr. Norlene Campbell aware and witnessed one
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
## 2011-06-10 NOTE — ED Notes (Signed)
telepysch completed  
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
## 2011-06-10 NOTE — ED Notes (Signed)
Handcuffs removed. security remains at bedside. Will undress dressing to right hand.

## 2011-06-11 NOTE — ED Notes (Signed)
Spoke with Social worker states the Social worker tonight unavailable and will see patient 0800 in the morning and have EDP place an order for consult to Child psychotherapist and find group home placement

## 2011-06-11 NOTE — ED Provider Notes (Signed)
Pt today is not answering questions and has peed in his bed.  He had no voiced complaints. Telepsych recommended inpatient however patient has currently been denied at behavioral health and a more regional. Still looking for placement.  Gwyneth Sprout, MD 06/11/11 [phone]

## 2011-06-11 NOTE — ED Notes (Signed)
Pt requested and was provided clean paper scrubs.

## 2011-06-11 NOTE — ED Notes (Signed)
Pt had been drinking coffee most of the night and morning.7:57am JG.

## 2011-06-11 NOTE — ED Notes (Signed)
Pt given decaf coffee to drink. No further needs. Sitter at bedside.
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
## 2011-06-11 NOTE — ED Notes (Signed)
Received Tele-Psych fax recommendations given to EDP.

## 2011-06-11 NOTE — ED Notes (Signed)
Pt urinated in bed- pt and bed being cleaned by sitter. No further needs.

## 2011-06-11 NOTE — ED Notes (Signed)
Patient currently having Tele Psych.

## 2011-06-11 NOTE — ED Notes (Signed)
Pt given lunch tray.

## 2011-06-11 NOTE — ED Notes (Signed)
Pt's caregiver to arrive around 1400. Telepsych ordered and MD Plunkett wants caregiver present during telepsych.
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
## 2011-06-11 NOTE — ED Notes (Signed)
Prior ED note made by myself not Velora Mediate, RN

## 2011-06-11 NOTE — ED Notes (Addendum)
Checked on pt, he had soiled the bed and his paper scrubs. I gave towels, wash clothes and paper scrubs and sheet to get him all clean.9:40am JG

## 2011-06-11 NOTE — ED Notes (Signed)
Caretaker also speaking with Tele Psych

## 2011-06-12 ENCOUNTER — Emergency Department (HOSPITAL_COMMUNITY): Payer: Medicare Other

## 2011-06-12 ENCOUNTER — Emergency Department (HOSPITAL_COMMUNITY)
Admission: EM | Admit: 2011-06-12 | Discharge: 2011-06-13 | Disposition: A | Discharge status: Home / Self Care | Payer: Medicare Other | Attending: Emergency Medicine | Admitting: Emergency Medicine

## 2011-06-12 ENCOUNTER — Encounter (HOSPITAL_COMMUNITY): Payer: Self-pay | Admitting: *Deleted

## 2011-06-12 DIAGNOSIS — I2510 Atherosclerotic heart disease of native coronary artery without angina pectoris: Secondary | ICD-10-CM | POA: Insufficient documentation

## 2011-06-12 DIAGNOSIS — J441 Chronic obstructive pulmonary disease with (acute) exacerbation: Secondary | ICD-10-CM | POA: Insufficient documentation

## 2011-06-12 DIAGNOSIS — F17200 Nicotine dependence, unspecified, uncomplicated: Secondary | ICD-10-CM | POA: Insufficient documentation

## 2011-06-12 DIAGNOSIS — F70 Mild intellectual disabilities: Secondary | ICD-10-CM | POA: Insufficient documentation

## 2011-06-12 DIAGNOSIS — IMO0002 Reserved for concepts with insufficient information to code with codable children: Secondary | ICD-10-CM | POA: Insufficient documentation

## 2011-06-12 DIAGNOSIS — Z859 Personal history of malignant neoplasm, unspecified: Secondary | ICD-10-CM | POA: Insufficient documentation

## 2011-06-12 DIAGNOSIS — E079 Disorder of thyroid, unspecified: Secondary | ICD-10-CM | POA: Insufficient documentation

## 2011-06-12 DIAGNOSIS — J9610 Chronic respiratory failure, unspecified whether with hypoxia or hypercapnia: Secondary | ICD-10-CM | POA: Insufficient documentation

## 2011-06-12 DIAGNOSIS — Z8711 Personal history of peptic ulcer disease: Secondary | ICD-10-CM | POA: Insufficient documentation

## 2011-06-12 DIAGNOSIS — H5440 Blindness, one eye, unspecified eye: Secondary | ICD-10-CM | POA: Insufficient documentation

## 2011-06-12 DIAGNOSIS — R079 Chest pain, unspecified: Secondary | ICD-10-CM | POA: Insufficient documentation

## 2011-06-12 DIAGNOSIS — F259 Schizoaffective disorder, unspecified: Secondary | ICD-10-CM | POA: Insufficient documentation

## 2011-06-12 DIAGNOSIS — F411 Generalized anxiety disorder: Secondary | ICD-10-CM | POA: Insufficient documentation

## 2011-06-12 DIAGNOSIS — N138 Other obstructive and reflux uropathy: Secondary | ICD-10-CM | POA: Insufficient documentation

## 2011-06-12 DIAGNOSIS — N401 Enlarged prostate with lower urinary tract symptoms: Secondary | ICD-10-CM | POA: Insufficient documentation

## 2011-06-12 DIAGNOSIS — I252 Old myocardial infarction: Secondary | ICD-10-CM | POA: Insufficient documentation

## 2011-06-12 LAB — BASIC METABOLIC PANEL
BUN: 12 mg/dL (ref 6–23)
Calcium: 9.2 mg/dL (ref 8.4–10.5)
Creatinine, Ser: 0.74 mg/dL (ref 0.50–1.35)
GFR calc Af Amer: >90 mL/min (ref >90)
GFR calc non Af Amer: >90 mL/min (ref >90)
Glucose, Bld: 86 mg/dL (ref 70–99)
Potassium: 2.8 mEq/L — ABNORMAL LOW (ref 3.5–5.1)

## 2011-06-12 LAB — CBC
HCT: 37.8 % — ABNORMAL LOW (ref 39.0–52.0)
Hemoglobin: 11.8 g/dL — ABNORMAL LOW (ref 13.0–17.0)
MCH: 25.3 pg — ABNORMAL LOW (ref 26.0–34.0)
MCHC: 31.2 g/dL (ref 30.0–36.0)
MCV: 80.9 fL (ref 78.0–100.0)
RDW: 14.3 % (ref 11.5–15.5)

## 2011-06-12 MED ORDER — NITROGLYCERIN 0.4 MG SL SUBL
0.4000 mg | SUBLINGUAL_TABLET | SUBLINGUAL | Status: DC | PRN
  Administered 2011-06-12 (×2): 0.4 mg via SUBLINGUAL

## 2011-06-12 MED ORDER — METHYLPREDNISOLONE SODIUM SUCC 125 MG IJ SOLR
125.0000 mg | Freq: Once | INTRAMUSCULAR | Status: AC
  Administered 2011-06-12: 125 mg via INTRAVENOUS
  Filled 2011-06-12: qty 2

## 2011-06-12 MED ORDER — POTASSIUM CHLORIDE CRYS ER 20 MEQ PO TBCR
40.0000 meq | EXTENDED_RELEASE_TABLET | Freq: Once | ORAL | Status: AC
  Administered 2011-06-13: 40 meq via ORAL
  Filled 2011-06-12: qty 2

## 2011-06-12 MED ORDER — ALBUTEROL SULFATE (5 MG/ML) 0.5% IN NEBU
5.0000 mg | INHALATION_SOLUTION | Freq: Once | RESPIRATORY_TRACT | Status: AC
  Administered 2011-06-12: 5 mg via RESPIRATORY_TRACT
  Filled 2011-06-12: qty 1

## 2011-06-12 MED ORDER — ALBUTEROL SULFATE (5 MG/ML) 0.5% IN NEBU
5.0000 mg | INHALATION_SOLUTION | RESPIRATORY_TRACT | Status: DC | PRN
  Administered 2011-06-12: 5 mg via RESPIRATORY_TRACT
  Filled 2011-06-12: qty 1

## 2011-06-12 MED ORDER — ASPIRIN 325 MG PO TABS
325.0000 mg | ORAL_TABLET | ORAL | Status: AC
  Administered 2011-06-12: 325 mg via ORAL
  Filled 2011-06-12: qty 1

## 2011-06-12 MED ORDER — LORAZEPAM 1 MG PO TABS
1.0000 mg | ORAL_TABLET | Freq: Once | ORAL | Status: AC
  Administered 2011-06-12: 1 mg via ORAL
  Filled 2011-06-12: qty 1

## 2011-06-12 NOTE — ED Notes (Signed)
[id number]<AV> Expected date:06/12/11<BR> Expected time: 9:57 PM<BR> Means of arrival:<BR> Comments:<BR> Hold for triage ekg pt

## 2011-06-12 NOTE — ED Notes (Signed)
Requesting more food "I want a sandwich, Im still hungry".  Will provide to patient

## 2011-06-12 NOTE — ED Provider Notes (Addendum)
Filed Vitals:   06/12/11 0305  BP: 158/103  Pulse: 109  Temp:   Resp: 18    Pt requests a breathing treatment this am.  On exam with faint wheezing bilaterally, no distress.  Albuterol ordered. Alert.  Will continue to monitor.   Celene Kras, MD 06/12/11 0825  Social work has evaluated.  Pt does not meet inpatient criteria.  Family wants him to go back to the group home.  If the patient were to become violent he should be arrested.  Celene Kras, MD 06/12/11 [phone]

## 2011-06-12 NOTE — Discharge Instructions (Signed)
Aggression  Physically aggressive behavior is common among small children. When frustrated or angry, toddlers may act out. Often, they will push, bite, or hit. Most children show less physical aggression as they grow up. Their language and interpersonal skills improve, too. But continued aggressive behavior is a sign of a problem. This behavior can lead to aggression and delinquency in adolescence and adulthood.  Aggressive behavior can be psychological or physical. Forms of psychological aggression include threatening or bullying others. Forms of physical aggression include:   Pushing.   Hitting.   Slapping.   Kicking.   Stabbing.   Shooting.   Raping.  PREVENTION   Encouraging the following behaviors can help manage aggression:   Respecting others and valuing differences.   Participating in school and community functions, including sports, music, after-school programs, community groups, and volunteer work.   Talking with an adult when they are sad, depressed, fearful, anxious, or angry. Discussions with a parent or other family member, counselor, teacher, or coach can help.   Avoiding alcohol and drug use.   Dealing with disagreements without aggression, such as conflict resolution. To learn this, children need parents and caregivers to model respectful communication and problem solving.   Limiting exposure to aggression and violence, such as video games that are not age appropriate, violence in the media, or domestic violence.  Document Released: 10/15/2006 Document Revised: 12/07/2010 Document Reviewed: 02/23/2010  ExitCare Patient Information 2012 [employer].
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
## 2011-06-12 NOTE — ED Notes (Signed)
Patient has refused to go to group home but is discharged from the hospital.  [employer] to take patient.

## 2011-06-12 NOTE — ED Notes (Signed)
[employer]PD in room with patient.  Patient does not wish to return to group home per patient.  ACT team in with [employer].

## 2011-06-12 NOTE — ED Notes (Signed)
Pt c/o chest pain radiating down left arm; jaw pain; states woke up this morning like this; clammy; states took cab to ER because of pain; short of breath;

## 2011-06-13 ENCOUNTER — Encounter (HOSPITAL_COMMUNITY): Payer: Self-pay

## 2011-06-13 ENCOUNTER — Emergency Department (HOSPITAL_COMMUNITY)
Admission: EM | Admit: 2011-06-13 | Discharge: 2011-06-14 | Disposition: A | Discharge status: Home / Self Care | Payer: Medicare Other | Attending: Emergency Medicine | Admitting: Emergency Medicine

## 2011-06-13 DIAGNOSIS — R5381 Other malaise: Secondary | ICD-10-CM | POA: Insufficient documentation

## 2011-06-13 DIAGNOSIS — R5383 Other fatigue: Secondary | ICD-10-CM | POA: Insufficient documentation

## 2011-06-13 DIAGNOSIS — R410 Disorientation, unspecified: Secondary | ICD-10-CM

## 2011-06-13 DIAGNOSIS — F29 Unspecified psychosis not due to a substance or known physiological condition: Secondary | ICD-10-CM | POA: Insufficient documentation

## 2011-06-13 DIAGNOSIS — J45909 Unspecified asthma, uncomplicated: Secondary | ICD-10-CM | POA: Insufficient documentation

## 2011-06-13 HISTORY — DX: Illness, unspecified: R69

## 2011-06-13 LAB — COMPREHENSIVE METABOLIC PANEL
ALT: 9 U/L (ref 0–53)
AST: 12 U/L (ref 0–37)
Albumin: 3.5 g/dL (ref 3.5–5.2)
Alkaline Phosphatase: 84 U/L (ref 39–117)
Glucose, Bld: 132 mg/dL — ABNORMAL HIGH (ref 70–99)
Potassium: 3.9 mEq/L (ref 3.5–5.1)
Sodium: 139 mEq/L (ref 135–145)
Total Protein: 7 g/dL (ref 6.0–8.3)

## 2011-06-13 LAB — URINALYSIS, ROUTINE W REFLEX MICROSCOPIC
Bilirubin Urine: NEGATIVE
Glucose, UA: NEGATIVE mg/dL
Hgb urine dipstick: NEGATIVE
Specific Gravity, Urine: 1.01 (ref 1.005–1.030)
pH: 7 (ref 5.0–8.0)

## 2011-06-13 LAB — RAPID URINE DRUG SCREEN, HOSP PERFORMED
Barbiturates: NOT DETECTED
Benzodiazepines: NOT DETECTED
Cocaine: NOT DETECTED
Opiates: NOT DETECTED
Tetrahydrocannabinol: NOT DETECTED

## 2011-06-13 LAB — CBC
Hemoglobin: 11.7 g/dL — ABNORMAL LOW (ref 13.0–17.0)
MCHC: 32.2 g/dL (ref 30.0–36.0)
Platelets: 308 10*3/uL (ref 150–400)

## 2011-06-13 LAB — POCT I-STAT TROPONIN I

## 2011-06-13 MED ORDER — ALBUTEROL SULFATE (5 MG/ML) 0.5% IN NEBU: 5.0000 mg | INHALATION_SOLUTION | Freq: Once | RESPIRATORY_TRACT | Status: DC

## 2011-06-13 MED ORDER — PREDNISONE 50 MG PO TABS
60.0000 mg | ORAL_TABLET | Freq: Once | ORAL | Status: AC
  Administered 2011-06-13: 60 mg via ORAL
  Filled 2011-06-13: qty 1

## 2011-06-13 MED ORDER — ONDANSETRON 4 MG PO TBDP
8.0000 mg | ORAL_TABLET | Freq: Once | ORAL | Status: AC
  Administered 2011-06-13: 8 mg via ORAL
  Filled 2011-06-13: qty 2

## 2011-06-13 MED ORDER — ALBUTEROL SULFATE HFA 108 (90 BASE) MCG/ACT IN AERS
2.0000 | INHALATION_SPRAY | Freq: Four times a day (QID) | RESPIRATORY_TRACT | Status: DC | PRN
  Administered 2011-06-13: 2 via RESPIRATORY_TRACT
  Filled 2011-06-13: qty 6.7
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
## 2011-06-13 NOTE — ED Notes (Signed)
Report to [facility]. Pt to room 39
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
## 2011-06-13 NOTE — ED Notes (Signed)
Report called to [facility]. Pt to psych ed per md campos order

## 2011-06-13 NOTE — ED Provider Notes (Addendum)
6:32 PM  Called to bedside. Pt wheezing. Hx of asthma. Albuterol and steroids given. No respiratory distress though. Will reassess.   Raeford Razor, MD 06/13/11 1834  8:25 PM I recognize from previous ED visit but cannot recall name. His behavior consistent with my previous evaluation of him. Must be registered incorrectly because he has extensive previous records but none under this MR#.   11:29 PM Discussed with registration and pt listed under MR [id number] and [id number]. These records will be merged. On review of these other records, pt is whom I suspected. His caregiver Alycia Rossetti was contacted and would like him discharged. He confirmed that is indeed at his baseline. Repeat lung exam with resolution of wheezing and no respiratory distress.  Raeford Razor, MD 06/15/11 [phone]

## 2011-06-13 NOTE — ED Provider Notes (Signed)
History     CSN: [id number]  Arrival date & time 06/12/11  2119   First MD Initiated Contact with Patient 06/12/11 2257      Chief Complaint  Patient presents with  . Chest Pain    (Consider location/radiation/quality/duration/timing/severity/associated sxs/prior treatment) HPI History provided by patient. Strong psychiatric history states has had chest pain since just today the radiates to his left arm associated with left hand numbness. Having going on all day unchanged and presents here tonight. Also has history of COPD with some wheezing and some shortness of breath. States takes medications at home as prescribed. No fevers. No cough. No trauma. No rash. Symptoms moderate in severity. No known aggravating or alleviating factors. Past Medical History  Diagnosis Date  . Seizures   . Psychiatric disorder   . Schizoaffective disorder   . Organic mood disorder   . Intermittent explosive disorder   . Generalized anxiety disorder   . Mild mental retardation   . COPD (chronic obstructive pulmonary disease)   . Peptic ulcer   . Thyroid disease   . Prostate hyperplasia with urinary obstruction   . Blind left eye   . Chronic respiratory failure   . Cancer   . Hypertension   . Diabetes mellitus   . Coronary artery disease   . Heart attack     Past Surgical History  Procedure Date  . Hip pinning,cannulated 04/11/2011    Procedure: CANNULATED HIP PINNING;  Surgeon: Mable Paris, MD;  Location: [facility] OR;  Service: Orthopedics;  Laterality: Left;  Perc. Screw Fixation/Left    Family History  Problem Relation Age of Onset  . Hypertension      History  Substance Use Topics  . Smoking status: Current Everyday Smoker -- 0.2 packs/day    Types: Cigarettes  . Smokeless tobacco: Not on file  . Alcohol Use: No      Review of Systems  Constitutional: Negative for fever and chills.  HENT: Negative for neck pain and neck stiffness.   Eyes: Negative for pain.    Respiratory: Positive for wheezing.   Cardiovascular: Positive for chest pain.  Gastrointestinal: Negative for abdominal pain.  Genitourinary: Negative for dysuria.  Musculoskeletal: Negative for back pain.  Skin: Negative for rash.  Neurological: Negative for headaches.  All other systems reviewed and are negative.    Allergies  Peanuts; Prolixin; Thorazine; and Lactose intolerance (gi)  Home Medications   Current Outpatient Rx  Name Route Sig Dispense Refill  . ALBUTEROL SULFATE HFA 108 (90 BASE) MCG/ACT IN AERS Inhalation Inhale 2 puffs into the lungs every 6 (six) hours as needed for wheezing. 1 Inhaler 2  . IPRATROPIUM-ALBUTEROL 18-103 MCG/ACT IN AERO Inhalation Inhale 2 puffs into the lungs every 6 (six) hours as needed. Shortness of breath.    Marland Kitchen DIPHENHYDRAMINE HCL 25 MG PO TABS Oral Take 1 tablet (25 mg total) by mouth every 6 (six) hours. 20 tablet 0  . DOCUSATE SODIUM 100 MG PO CAPS Oral Take 100 mg by mouth 2 (two) times daily.    Marland Kitchen FLUTICASONE PROPIONATE  HFA 44 MCG/ACT IN AERO Inhalation Inhale 2 puffs into the lungs 2 (two) times daily.    Marland Kitchen LEVOTHYROXINE SODIUM 75 MCG PO TABS Oral Take 75 mcg by mouth daily.      Marland Kitchen LITHIUM CARBONATE 300 MG PO CAPS Oral Take 300 mg by mouth 3 (three) times daily with meals.    Marland Kitchen RISPERIDONE 1 MG PO TABS Oral Take 1 mg by mouth  daily.    Marland Kitchen RISPERIDONE 2 MG PO TABS Oral Take 2 mg by mouth daily at 8 pm.     . TAMSULOSIN HCL 0.4 MG PO CAPS Oral Take 0.4 mg by mouth daily after supper.    Marland Kitchen TEMAZEPAM 15 MG PO CAPS Oral Take 15 mg by mouth at bedtime.    . TRAZODONE HCL 100 MG PO TABS Oral Take 100 mg by mouth at bedtime.        There were no vitals taken for this visit.  Physical Exam  Constitutional: He is oriented to person, place, and time. He appears well-developed and well-nourished.  HENT:  Head: Normocephalic and atraumatic.  Eyes: Conjunctivae and EOM are normal. Pupils are equal, round, and reactive to light.  Neck: Trachea  normal. Neck supple. No thyromegaly present.  Cardiovascular: Normal rate, regular rhythm, S1 normal, S2 normal and normal pulses.     No systolic murmur is present   No diastolic murmur is present  Pulses:      Radial pulses are 2+ on the right side, and 2+ on the left side.  Pulmonary/Chest: Effort normal. He has no rhonchi. He has no rales. He exhibits no tenderness.       Mild bilateral expiratory wheezes without respiratory distress  Abdominal: Soft. Normal appearance and bowel sounds are normal. There is no tenderness. There is no CVA tenderness and negative Murphy's sign.  Musculoskeletal:       BLE:s Calves nontender, no cords or erythema, negative Homans sign  Neurological: He is alert and oriented to person, place, and time. He has normal strength. No cranial nerve deficit or sensory deficit. GCS eye subscore is 4. GCS verbal subscore is 5. GCS motor subscore is 6.  Skin: Skin is warm and dry. No rash noted. He is not diaphoretic.  Psychiatric: His speech is normal.       Cooperative and appropriate    ED Course  Procedures (including critical care time)  Results for orders placed during the hospital encounter of 06/12/11  CBC      Component Value Range   WBC 7.0  4.0 - 10.5 K/uL   RBC 4.67  4.22 - 5.81 MIL/uL   Hemoglobin 11.8 (*) 13.0 - 17.0 g/dL   HCT 09.6 (*) 04.5 - 40.9 %   MCV 80.9  78.0 - 100.0 fL   MCH 25.3 (*) 26.0 - 34.0 pg   MCHC 31.2  30.0 - 36.0 g/dL   RDW 81.1  91.4 - 78.2 %   Platelets 281  150 - 400 K/uL  BASIC METABOLIC PANEL      Component Value Range   Sodium 140  135 - 145 mEq/L   Potassium 2.8 (*) 3.5 - 5.1 mEq/L   Chloride 105  96 - 112 mEq/L   CO2 24  19 - 32 mEq/L   Glucose, Bld 86  70 - 99 mg/dL   BUN 12  6 - 23 mg/dL   Creatinine, Ser 9.56  0.50 - 1.35 mg/dL   Calcium 9.2  8.4 - 21.3 mg/dL   GFR calc non Af Amer >90  >90 mL/min   GFR calc Af Amer >90  >90 mL/min  POCT I-STAT TROPONIN I      Component Value Range   Troponin i, poc 0.00   0.00 - 0.08 ng/mL   Comment 3           POCT I-STAT TROPONIN I      Component Value Range  Troponin i, poc 0.00  0.00 - 0.08 ng/mL   Comment 3             Date: 06/13/2011  Rate: 84  Rhythm: normal sinus rhythm  QRS Axis: normal  Intervals: QT prolonged  ST/T Wave abnormalities: nonspecific ST changes  Conduction Disutrbances:none  Narrative Interpretation:   Old EKG Reviewed: unchanged   Dg Chest Portable 1 View  06/13/2011  *RADIOLOGY REPORT*  Clinical Data: Chest pain  PORTABLE CHEST - 1 VIEW  Comparison: 06/10/2011  Findings: Hyperinflation.  Possible remote trauma at the proximal left humerus.  Remote right rib trauma.  The chin overlies the apices.  Patient rotated left. Normal heart size.  Diffuse peribronchial thickening.  No lobar consolidation.  Artifact projects over the left lung base.  IMPRESSION: COPD/chronic bronchitis. No acute superimposed process.  Original Report Authenticated By: Consuello Bossier, M.D.   IV, O2, monitor.  Albuterol and steroids provided   On recheck symptomatically improved still with some mild wheezing and repeat albuterol given.  Data EKG, X-ray and labs obtained and reviewed as above. MDM   Chest pain and wheezing consistent with COPD exacerbation. Serial evaluations with improving condition. Repeat troponin obtained and patient became upset regarding repeat blood work, and left the emergency department against medical device without speaking to the physician and declined to wait for me to talk to him.  Repeat troponin negative and doubt ACS. Patient did tell me earlier he has all medications at home including his inhaler.        [name], MD 06/13/11 [phone]

## 2011-06-13 NOTE — ED Notes (Addendum)
[facility] ACT called Nonnie Done [phone] pt's caregiver pt is to be transported back to [address] pt lives there at 1:1 assisted living pt eloped Per [employer]. [facility] called for transport awaiting EDP [facility] to place pt up for D/C

## 2011-06-13 NOTE — Discharge Instructions (Signed)
Confusion Confusion is the inability to think with your usual speed or clarity. Confusion may come on quickly or slowly over time. How quickly the confusion comes on depends on the cause. Confusion can be due to any number of causes. CAUSES   Concussion, head injury, or head trauma.   Seizures.   Stroke.   Fever.   Senility.   Heightened emotional states like rage or terror.   Mental illness in which the person loses the ability to determine what is real and what is not (hallucinations).   Infections.   Toxic effects from alcohol, drugs, or prescription medicines.   Dehydration and an imbalance of salts in the body (electrolytes).   Lack of sleep.   Low blood sugar (diabetes).   Low levels of oxygen (for example from chronic lung disorders).   Drug interactions or other medication side effects.   Nutritional deficiencies, especially niacin, thiamine, vitamin C, or vitamin B.   Sudden drop in body temperature (hypothermia).   Illness in the elderly. Constipation can result in confusion. An elderly person who is hospitalized may become confused due to change in daily routine.  SYMPTOMS  People often describe their thinking as cloudy or unclear when they are confused. Confusion can also include feeling disoriented. That means you are unaware of where or who you are. You may also not know what the date or time is. If confused, you may also have difficulty paying attention, remembering and making decisions. Some people also act aggressively when they are confused.  DIAGNOSIS  The medical evaluation of confusion may include:  Blood and urine tests.   X-rays.   Brain and nervous system tests.   Analyzing your brain waves (electroencphalogram or EEG).   A special X-ray (MRI) of your head or other special studies.  Your physician will ask questions such as:  Do you get days and nights mixed up?   Are you awake during regular sleep times?   Do you have trouble  recognizing people?   Do you know where you are?   Do you know the date and time?   Does the confusion come and go?   Is the confusion quickly getting worse?   Has there been a recent illness?   Has there been a recent head injury?   Are you diabetic?   Do you have a lung disorder?   What medication are you taking?   Have you taken drugs or alcohol?  TREATMENT  An admission to the hospital may not be needed, but a confused person should not be left alone. Stay with a family member or friend until the confusion clears. Avoid alcohol, pain relievers or sedative drugs until you have fully recovered. Do not drive until your caregiver says it is okay. HOME CARE INSTRUCTIONS What family and friends can do:  To find out if someone is confused ask him or her their name, age, and the date. If the person is unsure or answers incorrectly, he or she is confused.   Always introduce yourself, no matter how well the person knows you.   Often remind the person of his or her location.   Place a calendar and clock near the confused person.   Talk about current events and plans for the day.   Try to keep the environment calm, quiet and peaceful.   Make sure the patient keeps follow up appointments with their physician.  PREVENTION  Ways to prevent confusion:  Avoid alcohol.   Eat a balanced   diet.   Get enough sleep.   Do not become isolated. Spend time with other people and make plans for your days.   Keep careful watch on your blood sugar levels if you are diabetic.  SEEK IMMEDIATE MEDICAL CARE IF:   You develop severe headaches, repeated vomiting, seizures, blackouts or slurred speech.   There is increasing confusion, weakness, numbness, restlessness or personality changes.   You develop a loss of balance, have marked dizziness, feel uncoordinated or fall.   You have delusions, hallucinations or develop severe anxiety.   Your family members think you need to be rechecked.    Document Released: 01/26/2004 Document Revised: 12/07/2010 Document Reviewed: 09/23/2007 [employer] Patient Information 2012 [employer]. 

## 2011-06-13 NOTE — ED Notes (Signed)
Pt brought in by [employer], pt was at [facility] unknown name, states pt has been there many times but was sent here d/t pt is pale and lost a lot of weight. States is has outburst but is cooperative at this time.

## 2011-06-13 NOTE — ED Notes (Signed)
During med admin pts work of breathing had increase with audible wheezing. [facility], EDP made aware and advised albuterol. Med ordered and administrated as directed. Will continue to monitor and reassess after med given.
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
## 2011-06-13 NOTE — ED Notes (Signed)
Pt walked out of his room and into the bathroom to change clothes. He was mumbling that he didn't want to be here anymore because we weren't taking care of his pain. Pt pulled out his IV and a dressing was applied by myself before he went to the waiting area. RN notified of situation.

## 2011-06-13 NOTE — ED Notes (Signed)
Pt urinated on wall in room.  When asked why, stated "I don't know".  Was asked to ambulate to the bathroom where he finished urinating.  Pt then returned to room and was made to clean up the urine.

## 2011-06-14 ENCOUNTER — Encounter (HOSPITAL_COMMUNITY): Payer: Self-pay | Admitting: *Deleted

## 2011-08-31 ENCOUNTER — Encounter (HOSPITAL_COMMUNITY): Payer: Self-pay

## 2011-08-31 ENCOUNTER — Inpatient Hospital Stay (HOSPITAL_COMMUNITY)
Admission: EM | Admit: 2011-08-31 | Discharge: 2011-09-04 | Disposition: A | Discharge status: Home / Self Care | Payer: Medicare Other | Source: Home / Self Care | Attending: Internal Medicine | Admitting: Internal Medicine

## 2011-08-31 DIAGNOSIS — N139 Obstructive and reflux uropathy, unspecified: Secondary | ICD-10-CM | POA: Diagnosis present

## 2011-08-31 DIAGNOSIS — Z79899 Other long term (current) drug therapy: Secondary | ICD-10-CM

## 2011-08-31 DIAGNOSIS — I10 Essential (primary) hypertension: Secondary | ICD-10-CM

## 2011-08-31 DIAGNOSIS — G40909 Epilepsy, unspecified, not intractable, without status epilepticus: Secondary | ICD-10-CM | POA: Diagnosis present

## 2011-08-31 DIAGNOSIS — H5440 Blindness, one eye, unspecified eye: Secondary | ICD-10-CM | POA: Diagnosis present

## 2011-08-31 DIAGNOSIS — R569 Unspecified convulsions: Secondary | ICD-10-CM

## 2011-08-31 DIAGNOSIS — I2510 Atherosclerotic heart disease of native coronary artery without angina pectoris: Secondary | ICD-10-CM | POA: Diagnosis present

## 2011-08-31 DIAGNOSIS — F200 Paranoid schizophrenia: Secondary | ICD-10-CM | POA: Diagnosis present

## 2011-08-31 DIAGNOSIS — R0902 Hypoxemia: Secondary | ICD-10-CM | POA: Diagnosis present

## 2011-08-31 DIAGNOSIS — J9610 Chronic respiratory failure, unspecified whether with hypoxia or hypercapnia: Secondary | ICD-10-CM | POA: Diagnosis present

## 2011-08-31 DIAGNOSIS — F70 Mild intellectual disabilities: Secondary | ICD-10-CM | POA: Diagnosis present

## 2011-08-31 DIAGNOSIS — Z720 Tobacco use: Secondary | ICD-10-CM | POA: Diagnosis present

## 2011-08-31 DIAGNOSIS — R071 Chest pain on breathing: Secondary | ICD-10-CM | POA: Diagnosis present

## 2011-08-31 DIAGNOSIS — N401 Enlarged prostate with lower urinary tract symptoms: Secondary | ICD-10-CM | POA: Diagnosis present

## 2011-08-31 DIAGNOSIS — R45851 Suicidal ideations: Secondary | ICD-10-CM

## 2011-08-31 DIAGNOSIS — I252 Old myocardial infarction: Secondary | ICD-10-CM

## 2011-08-31 DIAGNOSIS — E119 Type 2 diabetes mellitus without complications: Secondary | ICD-10-CM | POA: Diagnosis present

## 2011-08-31 DIAGNOSIS — F17200 Nicotine dependence, unspecified, uncomplicated: Secondary | ICD-10-CM | POA: Diagnosis present

## 2011-08-31 DIAGNOSIS — I5033 Acute on chronic diastolic (congestive) heart failure: Secondary | ICD-10-CM | POA: Diagnosis present

## 2011-08-31 DIAGNOSIS — N138 Other obstructive and reflux uropathy: Secondary | ICD-10-CM | POA: Diagnosis present

## 2011-08-31 DIAGNOSIS — J441 Chronic obstructive pulmonary disease with (acute) exacerbation: Principal | ICD-10-CM | POA: Diagnosis present

## 2011-08-31 DIAGNOSIS — F259 Schizoaffective disorder, unspecified: Secondary | ICD-10-CM

## 2011-08-31 DIAGNOSIS — I5030 Unspecified diastolic (congestive) heart failure: Secondary | ICD-10-CM | POA: Diagnosis not present

## 2011-08-31 DIAGNOSIS — F411 Generalized anxiety disorder: Secondary | ICD-10-CM | POA: Diagnosis present

## 2011-08-31 DIAGNOSIS — I509 Heart failure, unspecified: Secondary | ICD-10-CM | POA: Diagnosis present

## 2011-08-31 DIAGNOSIS — J189 Pneumonia, unspecified organism: Secondary | ICD-10-CM | POA: Diagnosis present

## 2011-08-31 HISTORY — DX: Unspecified diastolic (congestive) heart failure: I50.30

## 2011-08-31 HISTORY — DX: Anxiety disorder, unspecified: F41.9

## 2011-08-31 LAB — COMPREHENSIVE METABOLIC PANEL
Albumin: 3.3 g/dL — ABNORMAL LOW (ref 3.5–5.2)
Alkaline Phosphatase: 80 U/L (ref 39–117)
BUN: 12 mg/dL (ref 6–23)
CO2: 31 mEq/L (ref 19–32)
Chloride: 102 mEq/L (ref 96–112)
GFR calc non Af Amer: >90 mL/min (ref >90)
Potassium: 4.2 mEq/L (ref 3.5–5.1)
Total Bilirubin: 0.2 mg/dL — ABNORMAL LOW (ref 0.3–1.2)

## 2011-08-31 LAB — POCT I-STAT 3, ART BLOOD GAS (G3+)
Acid-Base Excess: 6 mmol/L — ABNORMAL HIGH (ref 0.0–2.0)
Bicarbonate: 32.7 mEq/L — ABNORMAL HIGH (ref 20.0–24.0)
O2 Saturation: 94 %
Patient temperature: 98.6
pO2, Arterial: 74 mmHg — ABNORMAL LOW (ref 80.0–100.0)

## 2011-08-31 LAB — CBC WITH DIFFERENTIAL/PLATELET
Eosinophils Relative: 3 % (ref 0–5)
HCT: 37.5 % — ABNORMAL LOW (ref 39.0–52.0)
Hemoglobin: 11.5 g/dL — ABNORMAL LOW (ref 13.0–17.0)
Lymphocytes Relative: 9 % — ABNORMAL LOW (ref 12–46)
Lymphs Abs: 0.6 10*3/uL — ABNORMAL LOW (ref 0.7–4.0)
MCV: 84.8 fL (ref 78.0–100.0)
Monocytes Absolute: 0.2 10*3/uL (ref 0.1–1.0)
Monocytes Relative: 3 % (ref 3–12)
Neutro Abs: 5.5 10*3/uL (ref 1.7–7.7)
WBC: 6.4 10*3/uL (ref 4.0–10.5)

## 2011-08-31 LAB — URINALYSIS, ROUTINE W REFLEX MICROSCOPIC
Bilirubin Urine: NEGATIVE
Glucose, UA: NEGATIVE mg/dL
Hgb urine dipstick: NEGATIVE
Ketones, ur: NEGATIVE mg/dL
Nitrite: NEGATIVE
Specific Gravity, Urine: 1.006 (ref 1.005–1.030)
pH: 7 (ref 5.0–8.0)

## 2011-08-31 LAB — LITHIUM LEVEL: Lithium Lvl: 0.54 mEq/L — ABNORMAL LOW (ref 0.80–1.40)

## 2011-08-31 MED ORDER — AZITHROMYCIN 500 MG IV SOLR
500.0000 mg | Freq: Once | INTRAVENOUS | Status: AC
  Administered 2011-08-31: 500 mg via INTRAVENOUS
  Filled 2011-08-31: qty 500

## 2011-08-31 MED ORDER — IPRATROPIUM BROMIDE 0.02 % IN SOLN
RESPIRATORY_TRACT | Status: AC
  Filled 2011-08-31: qty 2.5

## 2011-08-31 MED ORDER — ALBUTEROL SULFATE (5 MG/ML) 0.5% IN NEBU
INHALATION_SOLUTION | RESPIRATORY_TRACT | Status: AC
  Filled 2011-08-31: qty 2

## 2011-08-31 MED ORDER — LITHIUM CARBONATE 300 MG PO CAPS
300.0000 mg | ORAL_CAPSULE | Freq: Once | ORAL | Status: AC
  Administered 2011-08-31: 300 mg via ORAL
  Filled 2011-08-31: qty 1

## 2011-08-31 MED ORDER — PIPERACILLIN-TAZOBACTAM 3.375 G IVPB
3.3750 g | Freq: Once | INTRAVENOUS | Status: AC
  Administered 2011-08-31: 3.375 g via INTRAVENOUS
  Filled 2011-08-31: qty 50

## 2011-08-31 NOTE — ED Notes (Signed)
Spoke to pharmacy tech who called primary care giver for pt about home medications. Medications listed in chart are up to date.

## 2011-08-31 NOTE — H&P (Addendum)
PCP:   Henri Medal, MD    Chief Complaint:   cough  HPI: [name] is a 56 y.o. male   has a past medical history of Seizures; Psychiatric disorder; Schizoaffective disorder; Organic mood disorder; Intermittent explosive disorder; Generalized anxiety disorder; Mild mental retardation; COPD (chronic obstructive pulmonary disease); Peptic ulcer; Thyroid disease; Prostate hyperplasia with urinary obstruction; Blind left eye; Chronic respiratory failure; Cancer; Hypertension; Diabetes mellitus; Coronary artery disease; Heart attack; and Diagnosis unknown.   Presented with  6 days of cough productive of greenish sputum. Denies fever. He has history of MR and lives at the group home. Patient has hard time providing history and no caregiver at bedside. He does say it hurts to breath and cough. HE says he gets PNA and bronchitis often. He denies hemoptyses, not sure that he have lost any weight. Apparently patient presented today to an urgent care and had a chest x-ray done that was consistent pneumonia from thereon he was sent to emergency department.  Review of Systems:    Pertinent positives include: night sweats, excess mucus,  productive cough,   Constitutional:  No weight loss, Fevers, chills, fatigue, weight loss  HEENT:  No headaches, Difficulty swallowing,Tooth/dental problems,Sore throat,  No sneezing, itching, ear ache, nasal congestion, post nasal drip,  Cardio-vascular:  No chest pain, Orthopnea, PND, anasarca, dizziness, palpitations.no Bilateral lower extremity swelling  GI:  No heartburn, indigestion, abdominal pain, nausea, vomiting, diarrhea, change in bowel habits, loss of appetite, melena, blood in stool, hematemesis Resp:  no shortness of breath at rest. No dyspnea on exertion, NoNo non-productive cough, No coughing up of blood.No change in color of mucus.No wheezing. Skin:  no rash or lesions. No jaundice GU:  no dysuria, change in color of urine, no urgency  or frequency. No straining to urinate.  No flank pain.  Musculoskeletal:  No joint pain or no joint swelling. No decreased range of motion. No back pain.  Psych:  No change in mood or affect. No depression or anxiety. No memory loss.  Neuro: no localizing neurological complaints, no tingling, no weakness, no double vision, no gait abnormality, no slurred speech, no confusion  Otherwise ROS are negative except for above, 10 systems were reviewed  Past Medical History: Past Medical History  Diagnosis Date  . Seizures   . Psychiatric disorder   . Schizoaffective disorder   . Organic mood disorder   . Intermittent explosive disorder   . Generalized anxiety disorder   . Mild mental retardation   . COPD (chronic obstructive pulmonary disease)   . Peptic ulcer   . Thyroid disease   . Prostate hyperplasia with urinary obstruction   . Blind left eye   . Chronic respiratory failure   . Cancer   . Hypertension   . Diabetes mellitus   . Coronary artery disease   . Heart attack   . Diagnosis unknown     pt poor historian unable to give information   Past Surgical History  Procedure Date  . Hip pinning,cannulated 04/11/2011    Procedure: CANNULATED HIP PINNING;  Surgeon: Mable Paris, MD;  Location: [facility] OR;  Service: Orthopedics;  Laterality: Left;  Perc. Screw Fixation/Left     Medications: Prior to Admission medications   Medication Sig Start Date End Date Taking? Authorizing Provider  albuterol-ipratropium (COMBIVENT) 18-103 MCG/ACT inhaler Inhale 2 puffs into the lungs every 6 (six) hours as needed. Shortness of breath.   Yes Historical Provider, MD  amLODipine (NORVASC) 10 MG tablet  Take 10 mg by mouth every morning.   Yes Historical Provider, MD  bimatoprost (LUMIGAN) 0.01 % SOLN Place 1 drop into both eyes 2 (two) times daily.   Yes Historical Provider, MD  brimonidine (ALPHAGAN P) 0.1 % SOLN Place 1 drop into both eyes every evening.   Yes Historical Provider, MD    fluticasone (FLOVENT HFA) 44 MCG/ACT inhaler Inhale 2 puffs into the lungs every evening.  02/05/11 02/05/12 Yes Vassie Loll, MD  levothyroxine (SYNTHROID, LEVOTHROID) 75 MCG tablet Take 75 mcg by mouth every morning.    Yes Historical Provider, MD  lithium carbonate 300 MG capsule Take 300 mg by mouth 3 (three) times daily with meals. Takes 0900, 1600 & 2000   Yes Historical Provider, MD  risperiDONE (RISPERDAL) 1 MG tablet Take 1 mg by mouth daily.   Yes Historical Provider, MD  risperiDONE (RISPERDAL) 2 MG tablet Take 2 mg by mouth daily at 8 pm.    Yes Historical Provider, MD  Tamsulosin HCl (FLOMAX) 0.4 MG CAPS Take 0.4 mg by mouth daily after supper.   Yes Historical Provider, MD  temazepam (RESTORIL) 30 MG capsule Take 30 mg by mouth every evening.   Yes Historical Provider, MD  traZODone (DESYREL) 100 MG tablet Take 100 mg by mouth at bedtime.     Yes Historical Provider, MD    Allergies:   Allergies  Allergen Reactions  . Peanuts (Peanut Oil) Other (See Comments)    Unknown reaction per caretaker  . Haldol (Haloperidol) Other (See Comments)    Unknown reaction per caretaker  . Other     Coffee-limit to 2 cups per caretaker; makes him violent  . Prolixin (Fluphenazine Hcl) Other (See Comments)    "muscle cramps"  . Thorazine (Chlorpromazine Hcl) Other (See Comments)    "burns"  . Lactose Intolerance (Gi) Rash    Social History:  Ambulatory  independently  Lives at group home   reports that he has been smoking Cigarettes.  He has a 12.5 pack-year smoking history. He does not have any smokeless tobacco history on file. He reports that he does not drink alcohol or use illicit drugs.   Family History: family history includes Hypertension in an unspecified family member.    Physical Exam: Patient Vitals for the past 24 hrs:  BP Temp Temp src Pulse Resp SpO2  08/31/11 2200 126/77 mmHg - - 73  - 90 %  08/31/11 2153 134/82 mmHg - - 80  16  88 %  08/31/11 2130 127/94 mmHg -  - 69  - 96 %  08/31/11 1913 140/82 mmHg 98.2 F (36.8 C) Oral 82  32  85 %    1. General:  in No Acute distress 2. Psychological: Alert and  Oriented, repetitive speech pattern 3. Head/ENT:   Moist  Mucous Membranes                          Head Non traumatic, neck supple                           Poor Dentition 4. SKIN:  decreased Skin turgor,  Skin clean Dry and intact no rash 5. Heart: Regular rate and rhythm no Murmur, Rub or gallop 6. Lungs: Mild wheezes occasional crackles per nursing staff greatly improved since his arrival   7. Abdomen: Soft, non-tender, Non distended 8. Lower extremities: no clubbing, cyanosis, or edema 9. Neurologically Grossly intact,  moving all 4 extremities equally tremulous throughout 10. MSK: Normal range of motion  body mass index is unknown because there is no height or weight on file.   Labs on Admission:   [facility] 08/31/11 1925  NA 141  K 4.2  CL 102  CO2 31  GLUCOSE 126*  BUN 12  CREATININE 0.81  CALCIUM 9.2  MG --  PHOS --    Basename 08/31/11 1925  AST 17  ALT 15  ALKPHOS 80  BILITOT 0.2*  PROT 7.3  ALBUMIN 3.3*   No results found for this basename: LIPASE:2,AMYLASE:2 in the last 72 hours  Basename 08/31/11 1925  WBC 6.4  NEUTROABS 5.5  HGB 11.5*  HCT 37.5*  MCV 84.8  PLT 196   No results found for this basename: CKTOTAL:3,CKMB:3,CKMBINDEX:3,TROPONINI:3 in the last 72 hours No results found for this basename: TSH,T4TOTAL,FREET3,T3FREE,THYROIDAB in the last 72 hours No results found for this basename: VITAMINB12:2,FOLATE:2,FERRITIN:2,TIBC:2,IRON:2,RETICCTPCT:2 in the last 72 hours Lab Results  Component Value Date   HGBA1C 5.6 03/10/2011    The CrCl is unknown because both a height and weight (above a minimum accepted value) are required for this calculation. ABG    Component Value Date/Time   PHART 7.373 08/31/2011 2037   HCO3 32.7* 08/31/2011 2037   TCO2 34 08/31/2011 2037   O2SAT 94.0 08/31/2011 2037     Lab  Results  Component Value Date   DDIMER 0.45 03/04/2011    UA no evidence of infection   Cultures:    Component Value Date/Time   SDES BLOOD RIGHT ARM 03/12/2011 2050   SPECREQUEST BOTTLES DRAWN AEROBIC AND ANAEROBIC 10CC 03/12/2011 2050   CULT NO GROWTH 5 DAYS 03/12/2011 2050   REPTSTATUS 03/19/2011 FINAL 03/12/2011 2050       Radiological Exams on Admission: No results found.  Chart has been reviewed  Assessment/Plan  56 year-old gentleman history schizoaffective disorder and mental retardation admitted the group home has history of COPD presents with hypoxia and cough with the findings on chest x-ray from urgent care consistent pneumonia  Present on Admission:  .Community acquired pneumonia - patient has risk factors such as exposure to the medical system and history of MRSA. When he was presented he was severely hypoxic and required nonrebreather mask. After nebulizer administration he was improved. Nonetheless will admit to step down. Broad-spectrum antibiotics. The repeat imaging in the morning after the administration of small dose of diuretic to see if it is some contribution from fluid overload.  Marland KitchenCOPD with acute exacerbation - patient hadn't been weazing has an extensive history of smoking a chest x-ray worrisome for diffuse pneumonia will hold off on steroids but otherwise give albuterol when necessary Atrovent and antibiotics he is currently have improved air movement  .Schizoaffective disorder - continue home medications  .Tobacco use -  Spoke to him about quitting  Leg edema- elevated BNP in the question of some fluid overload on chest x-ray will obtain echogram and give small amount of Lasix Chest pain with breathing -  pleurisy likely associated pneumonia and coughing he does have bilateral leg swelling which is symmetrical, no known risk factors for PE. His symptoms could be explained more by pneumonia Prophylaxis:  Lovenox, Protonix  CODE STATUS: FULL CODE  Other plan  as per orders.  I have spent a total of 55 min on this admission  Karlisha Mathena 08/31/2011, 10:41 PM  And he is in

## 2011-08-31 NOTE — ED Provider Notes (Signed)
History     CSN: [id number]  Arrival date & time 08/31/11  4098   First MD Initiated Contact with Patient 08/31/11 1844      No chief complaint on file.   (Consider location/radiation/quality/duration/timing/severity/associated sxs/prior treatment) Patient is a 56 y.o. male presenting with cough. The history is provided by the patient and the EMS personnel. No language interpreter was used.  Cough This is a chronic problem. The current episode started more than 1 week ago. The problem occurs constantly. The problem has been gradually worsening. The cough is productive of sputum. There has been no fever. Associated symptoms include shortness of breath, wheezing and eye redness. Pertinent negatives include no chest pain, no chills, no ear congestion, no ear pain, no headaches and no sore throat. The treatment provided no relief. He is a smoker. His past medical history is significant for bronchitis, pneumonia, COPD, emphysema and asthma.   56 year old male coming from [facility] with a PA lateral chest x-ray on a disc that shows  pneumonia. Notes state that patient's O2 sat was 86 in the office. Patient has had 3 breathing treatments with minimal improvement. Patient was wheezing in all fields at the office. Patient was seen by [facility] PA.  Patient was taking to [facility] by his care taker Nonnie Done who cares for Mr. Tillman Sers at a group home.  Patient appears short of breath with rhonchi in all 4 fields. Coughing up thick green sputum. Past medical history of psychiatric disorders including schizophrenia. Patient also has MR, MI, CAD, CHF, diabetes, multiple myeloma, R. eye, COPD and he continues to smoke. Spoke with his caretakers at the list of medications has medications that he is not on including Coumadin and Haldol. Patient is allergic to Haldol. He was on Coumadin with a hip surgery in the past. Patient was given 1 g of Rocephin IM at the doctor's office today. He  was also given steroids.    Past Medical History  Diagnosis Date  . Seizures   . Psychiatric disorder   . Schizoaffective disorder   . Organic mood disorder   . Intermittent explosive disorder   . Generalized anxiety disorder   . Mild mental retardation   . COPD (chronic obstructive pulmonary disease)   . Peptic ulcer   . Thyroid disease   . Prostate hyperplasia with urinary obstruction   . Blind left eye   . Chronic respiratory failure   . Cancer   . Hypertension   . Diabetes mellitus   . Coronary artery disease   . Heart attack   . Diagnosis unknown     pt poor historian unable to give information    Past Surgical History  Procedure Date  . Hip pinning,cannulated 04/11/2011    Procedure: CANNULATED HIP PINNING;  Surgeon: Mable Paris, MD;  Location: [facility] OR;  Service: Orthopedics;  Laterality: Left;  Perc. Screw Fixation/Left    Family History  Problem Relation Age of Onset  . Hypertension      History  Substance Use Topics  . Smoking status: Current Everyday Smoker -- 0.2 packs/day    Types: Cigarettes  . Smokeless tobacco: Not on file  . Alcohol Use: No      Review of Systems  Constitutional: Negative.  Negative for chills.  HENT: Negative.  Negative for ear pain and sore throat.   Eyes: Positive for redness.  Respiratory: Positive for cough, shortness of breath and wheezing.   Cardiovascular: Negative.  Negative for chest pain.  Gastrointestinal: Negative.  Negative for nausea, vomiting, abdominal pain and diarrhea.  Neurological: Negative.  Negative for headaches.  Psychiatric/Behavioral: Positive for behavioral problems and agitation. Negative for confusion.  All other systems reviewed and are negative.    Allergies  Peanuts; Prolixin; Thorazine; Thorazine; and Lactose intolerance (gi)  Home Medications   Current Outpatient Rx  Name Route Sig Dispense Refill  . ALBUTEROL SULFATE HFA 108 (90 BASE) MCG/ACT IN AERS Inhalation Inhale 2  puffs into the lungs every 6 (six) hours as needed for wheezing. 1 Inhaler 2  . IPRATROPIUM-ALBUTEROL 18-103 MCG/ACT IN AERO Inhalation Inhale 2 puffs into the lungs every 6 (six) hours as needed. Shortness of breath.    Marland Kitchen DIPHENHYDRAMINE HCL 25 MG PO TABS Oral Take 1 tablet (25 mg total) by mouth every 6 (six) hours. 20 tablet 0  . DOCUSATE SODIUM 100 MG PO CAPS Oral Take 100 mg by mouth 2 (two) times daily.    Marland Kitchen FLUTICASONE PROPIONATE  HFA 44 MCG/ACT IN AERO Inhalation Inhale 2 puffs into the lungs 2 (two) times daily.    Marland Kitchen LEVOTHYROXINE SODIUM 75 MCG PO TABS Oral Take 75 mcg by mouth daily.      Marland Kitchen LITHIUM CARBONATE 300 MG PO CAPS Oral Take 300 mg by mouth 3 (three) times daily with meals.    Marland Kitchen RISPERIDONE 1 MG PO TABS Oral Take 1 mg by mouth daily.    Marland Kitchen RISPERIDONE 2 MG PO TABS Oral Take 2 mg by mouth daily at 8 pm.     . TAMSULOSIN HCL 0.4 MG PO CAPS Oral Take 0.4 mg by mouth daily after supper.    Marland Kitchen TEMAZEPAM 15 MG PO CAPS Oral Take 15 mg by mouth at bedtime.    . TRAZODONE HCL 100 MG PO TABS Oral Take 100 mg by mouth at bedtime.        There were no vitals taken for this visit.  Physical Exam  Nursing note and vitals reviewed. Constitutional: He is oriented to person, place, and time. He appears well-developed and well-nourished.  HENT:  Head: Normocephalic.  Eyes: Conjunctivae and EOM are normal. Pupils are equal, round, and reactive to light.  Neck: Normal range of motion. Neck supple.  Cardiovascular: Normal rate.   Pulmonary/Chest: Effort normal.  Abdominal: Soft.  Musculoskeletal: Normal range of motion.  Neurological: He is alert and oriented to person, place, and time.  Skin: Skin is warm and dry.  Psychiatric: His mood appears anxious. His speech is rapid and/or pressured. He is agitated and aggressive. He is is not hyperactive, not actively hallucinating and not combative. Cognition and memory are impaired. He expresses impulsivity. He expresses no homicidal and no  suicidal ideation. He expresses no suicidal plans and no homicidal plans. He exhibits abnormal recent memory.       Cooperative but aggitated and difficult to communicate with.      ED Course  Procedures (including critical care time)   abg done on 50 % mask. 7.37, 56 po2 74  hco3 32, TCO2 34 Spoke with the hospitalist and they would like me to consult critical care for this patient. Spoke with critical care and they would like to see the patient therefore they admit.  Labs Reviewed - No data to display No results found.   No diagnosis found.    MDM  930 Patient will be admitted for pneumonia to step down bed. ABG on 50% facemask pH 737 CO2 56 is a 74 bicarbonate 32. Patient is tachypnic  and hypoxic on room air at 86%. He has rhonchi bilaterally. 1030 Critical care states that the patient can go to step down. Patient will be admitted to the hospitalist team 1   Labs Reviewed  CBC WITH DIFFERENTIAL - Abnormal; Notable for the following:    Hemoglobin 11.5 (*)     HCT 37.5 (*)     Neutrophils Relative 86 (*)     Lymphocytes Relative 9 (*)     Lymphs Abs 0.6 (*)     All other components within normal limits  COMPREHENSIVE METABOLIC PANEL - Abnormal; Notable for the following:    Glucose, Bld 126 (*)     Albumin 3.3 (*)     Total Bilirubin 0.2 (*)     All other components within normal limits  LITHIUM LEVEL - Abnormal; Notable for the following:    Lithium Lvl 0.54 (*)     All other components within normal limits  PRO B NATRIURETIC PEPTIDE - Abnormal; Notable for the following:    Pro B Natriuretic peptide (BNP) 827.6 (*)     All other components within normal limits  POCT I-STAT 3, BLOOD GAS (G3+) - Abnormal; Notable for the following:    pCO2 arterial 56.2 (*)     pO2, Arterial 74.0 (*)     Bicarbonate 32.7 (*)     Acid-Base Excess 6.0 (*)     All other components within normal limits  URINALYSIS, ROUTINE W REFLEX MICROSCOPIC  BLOOD GAS, ARTERIAL           Remi Haggard, NP 08/31/11 2234

## 2011-08-31 NOTE — ED Notes (Signed)
Pt given sandwich and drinking per MD. Pt sleeping

## 2011-08-31 NOTE — ED Notes (Addendum)
Pt continues to take off venti-mask. Pt states drop to upper 80's/ pt placed on 2 L [place] and is keeping that on well. Pt states increase to 95% of 2L.

## 2011-08-31 NOTE — ED Notes (Signed)
Pt chest x-ray from PCP states pneumonia.

## 2011-08-31 NOTE — ED Notes (Signed)
ICU MD at bedside, feels hospitalist appropriate for pt.

## 2011-08-31 NOTE — ED Notes (Signed)
Pt here by EMS from physician office for wheezing and SOB for past 6 days. Given albuterol treatment at office and coughing up green sputum. EMS gave 10 mg Alb and 0.5 Atrovent in route. Bilateral wheezing in the lower lobes. Congestion throughout and left side is more congested than the right side. Dropped to 86 % after treatment finished and c/o heart hurting and cough. 140/90, 90 HR, 20 RR, 97 % on neb trxt.

## 2011-08-31 NOTE — ED Provider Notes (Signed)
Medical screening examination/treatment/procedure(s) were conducted as a shared visit with non-physician practitioner(s) and myself.  I personally evaluated the patient during the encounter.  Evaluated pt alongside midlevel provider.  Pt appeared comfortable with marked bilat ronchi on initial evaluation.  He has had repeatedly low O2 sats on monitor.  Supplemental O2 hs been increased.  2005.  Repeat eval performed. Pt c/o chest tightness.  Note marked bilat wheezing, increased work of breathing without distress.  Plan continuous nebs.  Prehospital CXR demonstrates PNA.  Abx ordered already.  Plan admit.  Will monitor closely.  CRITICAL CARE Performed by: Dana Allan T   Total critical care time: 30+  Critical care time was exclusive of separately billable procedures and treating other patients.  Critical care was necessary to treat or prevent imminent or life-threatening deterioration.  Critical care was time spent personally by me on the following activities: development of treatment plan with patient and/or surrogate as well as nursing, discussions with consultants, evaluation of patient's response to treatment, examination of patient, obtaining history from patient or surrogate, ordering and performing treatments and interventions, ordering and review of laboratory studies, ordering and review of radiographic studies, pulse oximetry and re-evaluation of patient's condition.  Tobin Chad, MD 08/31/11 2012

## 2011-08-31 NOTE — ED Notes (Signed)
Admitting MD [facility] at bedside.

## 2011-09-01 ENCOUNTER — Inpatient Hospital Stay (HOSPITAL_COMMUNITY): Payer: Medicare Other

## 2011-09-01 DIAGNOSIS — J189 Pneumonia, unspecified organism: Secondary | ICD-10-CM

## 2011-09-01 DIAGNOSIS — R0902 Hypoxemia: Secondary | ICD-10-CM | POA: Diagnosis present

## 2011-09-01 DIAGNOSIS — I10 Essential (primary) hypertension: Secondary | ICD-10-CM

## 2011-09-01 DIAGNOSIS — J441 Chronic obstructive pulmonary disease with (acute) exacerbation: Principal | ICD-10-CM

## 2011-09-01 LAB — COMPREHENSIVE METABOLIC PANEL
ALT: 15 U/L (ref 0–53)
AST: 14 U/L (ref 0–37)
Alkaline Phosphatase: 80 U/L (ref 39–117)
CO2: 31 mEq/L (ref 19–32)
Chloride: 102 mEq/L (ref 96–112)
GFR calc Af Amer: >90 mL/min (ref >90)
GFR calc non Af Amer: >90 mL/min (ref >90)
Glucose, Bld: 219 mg/dL — ABNORMAL HIGH (ref 70–99)
Potassium: 4 mEq/L (ref 3.5–5.1)
Sodium: 141 mEq/L (ref 135–145)

## 2011-09-01 LAB — TROPONIN I: Troponin I: <0.3 ng/mL (ref <0.30)

## 2011-09-01 LAB — CBC WITH DIFFERENTIAL/PLATELET
Basophils Absolute: 0 10*3/uL (ref 0.0–0.1)
Basophils Relative: 0 % (ref 0–1)
Eosinophils Absolute: 0 10*3/uL (ref 0.0–0.7)
Hemoglobin: 11.1 g/dL — ABNORMAL LOW (ref 13.0–17.0)
MCH: 25.3 pg — ABNORMAL LOW (ref 26.0–34.0)
MCHC: 30.3 g/dL (ref 30.0–36.0)
Monocytes Absolute: 0.2 10*3/uL (ref 0.1–1.0)
Monocytes Relative: 4 % (ref 3–12)
Neutro Abs: 4.5 10*3/uL (ref 1.7–7.7)
Neutrophils Relative %: 88 % — ABNORMAL HIGH (ref 43–77)
RDW: 14.6 % (ref 11.5–15.5)

## 2011-09-01 LAB — HEMOGLOBIN A1C: Hgb A1c MFr Bld: 6 % — ABNORMAL HIGH (ref <5.7)

## 2011-09-01 LAB — STREP PNEUMONIAE URINARY ANTIGEN: Strep Pneumo Urinary Antigen: NEGATIVE

## 2011-09-01 MED ORDER — FUROSEMIDE 10 MG/ML IJ SOLN
20.0000 mg | Freq: Once | INTRAMUSCULAR | Status: AC
  Administered 2011-09-01: 20 mg via INTRAVENOUS

## 2011-09-01 MED ORDER — ALBUTEROL SULFATE (5 MG/ML) 0.5% IN NEBU
2.5000 mg | INHALATION_SOLUTION | RESPIRATORY_TRACT | Status: DC | PRN
  Administered 2011-09-01 – 2011-09-02 (×2): 2.5 mg via RESPIRATORY_TRACT
  Filled 2011-09-01 (×2): qty 0.5

## 2011-09-01 MED ORDER — FUROSEMIDE 10 MG/ML IJ SOLN
INTRAMUSCULAR | Status: AC
  Administered 2011-09-01: 20 mg via INTRAVENOUS
  Filled 2011-09-01: qty 4

## 2011-09-01 MED ORDER — PIPERACILLIN-TAZOBACTAM 3.375 G IVPB 30 MIN
3.3750 g | Freq: Three times a day (TID) | INTRAVENOUS | Status: DC
  Administered 2011-09-01: 3.375 g via INTRAVENOUS
  Filled 2011-09-01 (×3): qty 50

## 2011-09-01 MED ORDER — PREDNISONE 50 MG PO TABS
50.0000 mg | ORAL_TABLET | Freq: Every day | ORAL | Status: DC
  Administered 2011-09-01 – 2011-09-02 (×2): 50 mg via ORAL
  Filled 2011-09-01 (×3): qty 1

## 2011-09-01 MED ORDER — SODIUM CHLORIDE 0.9 % IJ SOLN
3.0000 mL | Freq: Two times a day (BID) | INTRAMUSCULAR | Status: DC
  Administered 2011-09-01 – 2011-09-03 (×7): 3 mL via INTRAVENOUS

## 2011-09-01 MED ORDER — RISPERIDONE 2 MG PO TABS
2.0000 mg | ORAL_TABLET | Freq: Every day | ORAL | Status: DC
  Administered 2011-09-01 – 2011-09-03 (×4): 2 mg via ORAL
  Filled 2011-09-01 (×6): qty 1

## 2011-09-01 MED ORDER — SODIUM CHLORIDE 0.9 % IV SOLN: 250.0000 mL | INTRAVENOUS | Status: DC | PRN

## 2011-09-01 MED ORDER — LEVOTHYROXINE SODIUM 75 MCG PO TABS
75.0000 ug | ORAL_TABLET | Freq: Every day | ORAL | Status: DC
  Administered 2011-09-01 – 2011-09-04 (×4): 75 ug via ORAL
  Filled 2011-09-01 (×5): qty 1

## 2011-09-01 MED ORDER — MOXIFLOXACIN HCL 400 MG PO TABS
400.0000 mg | ORAL_TABLET | Freq: Every day | ORAL | Status: DC
  Administered 2011-09-01 – 2011-09-03 (×3): 400 mg via ORAL
  Filled 2011-09-01 (×5): qty 1

## 2011-09-01 MED ORDER — TAMSULOSIN HCL 0.4 MG PO CAPS
0.4000 mg | ORAL_CAPSULE | Freq: Every day | ORAL | Status: DC
  Administered 2011-09-01 – 2011-09-03 (×3): 0.4 mg via ORAL
  Filled 2011-09-01 (×4): qty 1

## 2011-09-01 MED ORDER — LITHIUM CARBONATE 300 MG PO CAPS
300.0000 mg | ORAL_CAPSULE | Freq: Three times a day (TID) | ORAL | Status: DC
  Administered 2011-09-01 – 2011-09-04 (×11): 300 mg via ORAL
  Filled 2011-09-01 (×13): qty 1

## 2011-09-01 MED ORDER — TRAZODONE HCL 100 MG PO TABS
100.0000 mg | ORAL_TABLET | Freq: Every day | ORAL | Status: DC
  Administered 2011-09-01 – 2011-09-03 (×4): 100 mg via ORAL
  Filled 2011-09-01 (×5): qty 1

## 2011-09-01 MED ORDER — IPRATROPIUM BROMIDE 0.02 % IN SOLN
0.5000 mg | Freq: Four times a day (QID) | RESPIRATORY_TRACT | Status: DC
  Administered 2011-09-01 – 2011-09-02 (×4): 0.5 mg via RESPIRATORY_TRACT
  Filled 2011-09-01 (×7): qty 2.5

## 2011-09-01 MED ORDER — IPRATROPIUM-ALBUTEROL 18-103 MCG/ACT IN AERO: 2.0000 | INHALATION_SPRAY | Freq: Four times a day (QID) | RESPIRATORY_TRACT | Status: DC | PRN

## 2011-09-01 MED ORDER — SODIUM CHLORIDE 0.9 % IJ SOLN: 3.0000 mL | INTRAMUSCULAR | Status: DC | PRN

## 2011-09-01 MED ORDER — AMLODIPINE BESYLATE 10 MG PO TABS
10.0000 mg | ORAL_TABLET | Freq: Every morning | ORAL | Status: DC
  Administered 2011-09-01 – 2011-09-04 (×4): 10 mg via ORAL
  Filled 2011-09-01 (×4): qty 1

## 2011-09-01 MED ORDER — DEXTROSE 5 % IV SOLN
500.0000 mg | INTRAVENOUS | Status: DC
  Filled 2011-09-01: qty 500

## 2011-09-01 MED ORDER — ALBUTEROL SULFATE (5 MG/ML) 0.5% IN NEBU
2.5000 mg | INHALATION_SOLUTION | Freq: Four times a day (QID) | RESPIRATORY_TRACT | Status: DC
  Administered 2011-09-01 – 2011-09-02 (×6): 2.5 mg via RESPIRATORY_TRACT
  Filled 2011-09-01 (×7): qty 0.5

## 2011-09-01 MED ORDER — ENOXAPARIN SODIUM 40 MG/0.4ML ~~LOC~~ SOLN
40.0000 mg | SUBCUTANEOUS | Status: DC
  Administered 2011-09-01 – 2011-09-04 (×4): 40 mg via SUBCUTANEOUS
  Filled 2011-09-01 (×4): qty 0.4

## 2011-09-01 MED ORDER — TEMAZEPAM 15 MG PO CAPS
30.0000 mg | ORAL_CAPSULE | Freq: Every evening | ORAL | Status: DC
  Administered 2011-09-01 – 2011-09-03 (×3): 30 mg via ORAL
  Filled 2011-09-01 (×3): qty 2

## 2011-09-01 MED ORDER — BRIMONIDINE TARTRATE 0.2 % OP SOLN
1.0000 [drp] | Freq: Every evening | OPHTHALMIC | Status: DC
  Administered 2011-09-01 – 2011-09-03 (×3): 1 [drp] via OPHTHALMIC
  Filled 2011-09-01 (×2): qty 5

## 2011-09-01 MED ORDER — BIMATOPROST 0.01 % OP SOLN
1.0000 [drp] | Freq: Two times a day (BID) | OPHTHALMIC | Status: DC
  Administered 2011-09-01 – 2011-09-04 (×8): 1 [drp] via OPHTHALMIC
  Filled 2011-09-01 (×2): qty 2.5

## 2011-09-01 MED ORDER — FLUTICASONE PROPIONATE HFA 44 MCG/ACT IN AERO
2.0000 | INHALATION_SPRAY | Freq: Every evening | RESPIRATORY_TRACT | Status: DC
  Administered 2011-09-01 – 2011-09-03 (×3): 2 via RESPIRATORY_TRACT
  Filled 2011-09-01: qty 10.6

## 2011-09-01 MED ORDER — RISPERIDONE 1 MG PO TABS
1.0000 mg | ORAL_TABLET | Freq: Every day | ORAL | Status: DC
  Administered 2011-09-01 – 2011-09-04 (×4): 1 mg via ORAL
  Filled 2011-09-01 (×4): qty 1

## 2011-09-01 MED ORDER — VANCOMYCIN HCL IN DEXTROSE 1-5 GM/200ML-% IV SOLN
1000.0000 mg | Freq: Three times a day (TID) | INTRAVENOUS | Status: DC
  Administered 2011-09-01: 1000 mg via INTRAVENOUS
  Filled 2011-09-01 (×3): qty 200

## 2011-09-01 MED ORDER — GUAIFENESIN ER 600 MG PO TB12
600.0000 mg | ORAL_TABLET | Freq: Two times a day (BID) | ORAL | Status: DC
  Administered 2011-09-01 – 2011-09-04 (×8): 600 mg via ORAL
  Filled 2011-09-01 (×9): qty 1

## 2011-09-01 NOTE — ED Provider Notes (Signed)
Medical screening examination/treatment/procedure(s) were conducted as a shared visit with non-physician practitioner(s) and myself.  I personally evaluated the patient during the encounter.  Serial exams performed.  Note mild increase in resp effort without distress.  Plan admit.  Tobin Chad, MD 09/01/11 [id number]

## 2011-09-01 NOTE — Progress Notes (Signed)
[facility]             Progress Note   Subjective: Breathing better, cough with yellow phleghm  Objective: Vital signs in last 24 hours: Temp:  [97.7 F (36.5 C)-98.2 F (36.8 C)] 98.1 F (36.7 C) (08/31 0808) Pulse Rate:  [63-87] 63  (08/31 0400) Resp:  [16-32] 20  (08/31 0808) BP: (121-142)/(65-94) 137/85 mmHg (08/31 0808) SpO2:  [85 %-98 %] 98 % (08/31 0900) FiO2 (%):  [35 %-50 %] 50 % (08/30 1949) Weight:  [72.4 kg (159 lb 9.8 oz)] 72.4 kg (159 lb 9.8 oz) (08/31 0000) Weight change:     Intake/Output from previous day: 08/30 0701 - 08/31 0700 In: 1420 [P.O.:1220; IV Piggyback:200] Out: 3950 [Urine:3950] Total I/O In: 240 [P.O.:240] Out: 1150 [Urine:1150]   Physical Exam: General: Alert, awake, oriented x3, in no acute distress. HEENT: No bruits, no goiter. Heart: Regular rate and rhythm, without murmurs, rubs, gallops. Lungs: poor air movement bilaterally, scattered wheezes Abdomen: Soft, nontender, nondistended, positive bowel sounds. Extremities: No clubbing cyanosis or edema with positive pedal pulses. Neuro: Grossly intact, nonfocal.    Lab Results: Basic Metabolic Panel:  Basename 09/01/11 0125 08/31/11 1925  NA 141 141  K 4.0 4.2  CL 102 102  CO2 31 31  GLUCOSE 219* 126*  BUN 12 12  CREATININE 0.88 0.81  CALCIUM 9.5 9.2  MG -- --  PHOS -- --   Liver Function Tests:  Basename 09/01/11 0125 08/31/11 1925  AST 14 17  ALT 15 15  ALKPHOS 80 80  BILITOT 0.2* 0.2*  PROT 7.3 7.3  ALBUMIN 3.3* 3.3*   No results found for this basename: LIPASE:2,AMYLASE:2 in the last 72 hours No results found for this basename: AMMONIA:2 in the last 72 hours CBC:  Basename 09/01/11 0125 08/31/11 1925  WBC 5.1 6.4  NEUTROABS 4.5 5.5  HGB 11.1* 11.5*  HCT 36.6* 37.5*  MCV 83.6 84.8  PLT 191 196   Cardiac Enzymes: No results found for this basename: CKTOTAL:3,CKMB:3,CKMBINDEX:3,TROPONINI:3 in the last 72 hours BNP:  Basename 08/31/11 1928   PROBNP 827.6*   D-Dimer: No results found for this basename: DDIMER:2 in the last 72 hours CBG: No results found for this basename: GLUCAP:6 in the last 72 hours Hemoglobin A1C: No results found for this basename: HGBA1C in the last 72 hours Fasting Lipid Panel: No results found for this basename: CHOL,HDL,LDLCALC,TRIG,CHOLHDL,LDLDIRECT in the last 72 hours Thyroid Function Tests: No results found for this basename: TSH,T4TOTAL,FREET4,T3FREE,THYROIDAB in the last 72 hours Anemia Panel: No results found for this basename: VITAMINB12,FOLATE,FERRITIN,TIBC,IRON,RETICCTPCT in the last 72 hours Coagulation: No results found for this basename: LABPROT:2,INR:2 in the last 72 hours Urine Drug Screen: Drugs of Abuse     Component Value Date/Time   LABOPIA NONE DETECTED 06/13/2011 1000   COCAINSCRNUR NONE DETECTED 06/13/2011 1000   LABBENZ NONE DETECTED 06/13/2011 1000   AMPHETMU NONE DETECTED 06/13/2011 1000   THCU NONE DETECTED 06/13/2011 1000   LABBARB NONE DETECTED 06/13/2011 1000    Alcohol Level: No results found for this basename: ETH:2 in the last 72 hours Urinalysis:  Basename 08/31/11 1950  COLORURINE YELLOW  LABSPEC 1.006  PHURINE 7.0  GLUCOSEU NEGATIVE  HGBUR NEGATIVE  BILIRUBINUR NEGATIVE  KETONESUR NEGATIVE  PROTEINUR NEGATIVE  UROBILINOGEN 0.2  NITRITE NEGATIVE  LEUKOCYTESUR NEGATIVE    Recent Results (from the past 240 hour(s))  MRSA PCR SCREENING     Status: Abnormal   Collection Time   08/31/11 11:59 PM  Component Value Range Status Comment   MRSA by PCR POSITIVE (*) NEGATIVE Final     Studies/Results: Dg Chest 2 View  09/01/2011  *RADIOLOGY REPORT*  Clinical Data: COPD exacerbation.  Chest pain with breathing.  CHEST - 2 VIEW  Comparison: Two-view chest x-ray 06/12/2011, 05/30/2011, 02/15/2011.  Findings: Cardiac silhouette upper normal in size, unchanged. Hilar and mediastinal contours unremarkable.  Prominent bronchovascular markings diffusely and  moderate central peribronchial thickening, unchanged.  Minimal scarring in the left upper lobe.  No new pulmonary parenchymal abnormalities.  No pleural effusions.  Degenerative changes throughout the thoracic spine.  Old healed fracture involving the posterior 5th right rib.  IMPRESSION: COPD (chronic bronchitis).  No acute cardiopulmonary disease.   Original Report Authenticated By: Arnell Sieving, M.D.     Medications: Scheduled Meds:   . albuterol  2.5 mg Nebulization Q6H  . albuterol      . amLODipine  10 mg Oral q morning - 10a  . azithromycin  500 mg Intravenous Once  . azithromycin  500 mg Intravenous Q24H  . bimatoprost  1 drop Both Eyes BID  . brimonidine  1 drop Both Eyes QPM  . enoxaparin  40 mg Subcutaneous Q24H  . fluticasone  2 puff Inhalation QPM  . furosemide  20 mg Intravenous Once  . guaiFENesin  600 mg Oral BID  . ipratropium      . ipratropium  0.5 mg Nebulization Q6H  . levothyroxine  75 mcg Oral Q0600  . lithium carbonate  300 mg Oral Once  . lithium carbonate  300 mg Oral TID WC  . piperacillin-tazobactam (ZOSYN)  IV  3.375 g Intravenous Once  . piperacillin-tazobactam  3.375 g Intravenous Q8H  . risperiDONE  1 mg Oral Daily  . risperiDONE  2 mg Oral Q2000  . sodium chloride  3 mL Intravenous Q12H  . Tamsulosin HCl  0.4 mg Oral QPC supper  . temazepam  30 mg Oral QPM  . traZODone  100 mg Oral QHS  . vancomycin  1,000 mg Intravenous Q8H   Continuous Infusions:  PRN Meds:.sodium chloride, albuterol, albuterol-ipratropium, sodium chloride  Assessment/Plan:  .COPD with acute exacerbation -  Improving, add prednisone Albuterol/atrovent nebs Q6 and PRN CXr without pneumonia DC vanc/Zosyn Add avelox Wean O2  .Schizoaffective disorder - continue home medications   .Tobacco use -counseled  Leg edema- elevated BNP,m cxr without fluid overload, Fu echo, received lasix x1  Pleuritic chest pain: from COPD exacerbation, almost  resolved  Hyperglycemia: check AIC  Prophylaxis: Lovenox, Protonix      LOS: 1 day   Soha Thorup [facility] Pager: [phone] 09/01/2011, 9:17 AM

## 2011-09-01 NOTE — Progress Notes (Signed)
Pt arrived to 3311.  Walked from stretcher to bed.  CHG bath given.  Pt complaining of pain in chest and back, pain with breathing.  Respirations even and unlabored.  O2 Sat above 90% on 2L O2.  Will continue to monitor.    Maximino Greenland RN

## 2011-09-01 NOTE — Progress Notes (Signed)
ANTIBIOTIC CONSULT NOTE - INITIAL  Pharmacy Consult for vancomycin  Indication: pneumonia  Allergies  Allergen Reactions  . Peanuts (Peanut Oil) Other (See Comments)    Unknown reaction per caretaker  . Haldol (Haloperidol) Other (See Comments)    Unknown reaction per caretaker  . Other     Coffee-limit to 2 cups per caretaker; makes him violent  . Prolixin (Fluphenazine Hcl) Other (See Comments)    "muscle cramps"  . Thorazine (Chlorpromazine Hcl) Other (See Comments)    "burns"  . Lactose Intolerance (Gi) Rash    Patient Measurements: Height: 6' (182.9 cm) Weight: 159 lb 9.8 oz (72.4 kg) IBW/kg (Calculated) : 77.6  Adjusted Body Weight:   Vital Signs: Temp: 97.7 F (36.5 C) (08/31 0000) Temp src: Oral (08/31 0000) BP: 142/76 mmHg (08/31 0000) Pulse Rate: 69  (08/31 0000) Intake/Output from previous day:   Intake/Output from this shift:    Labs:  [facility] 08/31/11 1925  WBC 6.4  HGB 11.5*  PLT 196  LABCREA --  CREATININE 0.81   Estimated Creatinine Clearance: 103 ml/min (by C-G formula based on Cr of 0.81). No results found for this basename: VANCOTROUGH:2,VANCOPEAK:2,VANCORANDOM:2,GENTTROUGH:2,GENTPEAK:2,GENTRANDOM:2,TOBRATROUGH:2,TOBRAPEAK:2,TOBRARND:2,AMIKACINPEAK:2,AMIKACINTROU:2,AMIKACIN:2, in the last 72 hours   Microbiology: No results found for this or any previous visit (from the past 720 hour(s)).  Medical History: Past Medical History  Diagnosis Date  . Seizures   . Psychiatric disorder   . Schizoaffective disorder   . Organic mood disorder   . Intermittent explosive disorder   . Generalized anxiety disorder   . Mild mental retardation   . COPD (chronic obstructive pulmonary disease)   . Peptic ulcer   . Thyroid disease   . Prostate hyperplasia with urinary obstruction   . Blind left eye   . Chronic respiratory failure   . Cancer   . Hypertension   . Diabetes mellitus   . Coronary artery disease   . Heart attack   . Diagnosis  unknown     pt poor historian unable to give information    Medications:  Prescriptions prior to admission  Medication Sig Dispense Refill  . albuterol-ipratropium (COMBIVENT) 18-103 MCG/ACT inhaler Inhale 2 puffs into the lungs every 6 (six) hours as needed. Shortness of breath.      Marland Kitchen amLODipine (NORVASC) 10 MG tablet Take 10 mg by mouth every morning.      . bimatoprost (LUMIGAN) 0.01 % SOLN Place 1 drop into both eyes 2 (two) times daily.      . brimonidine (ALPHAGAN P) 0.1 % SOLN Place 1 drop into both eyes every evening.      . fluticasone (FLOVENT HFA) 44 MCG/ACT inhaler Inhale 2 puffs into the lungs every evening.       Marland Kitchen levothyroxine (SYNTHROID, LEVOTHROID) 75 MCG tablet Take 75 mcg by mouth every morning.       . lithium carbonate 300 MG capsule Take 300 mg by mouth 3 (three) times daily with meals. Takes 0900, 1600 & 2000      . risperiDONE (RISPERDAL) 1 MG tablet Take 1 mg by mouth daily.      . risperiDONE (RISPERDAL) 2 MG tablet Take 2 mg by mouth daily at 8 pm.       . Tamsulosin HCl (FLOMAX) 0.4 MG CAPS Take 0.4 mg by mouth daily after supper.      . temazepam (RESTORIL) 30 MG capsule Take 30 mg by mouth every evening.      . traZODone (DESYREL) 100 MG tablet Take 100 mg  by mouth at bedtime.         Assessment: 6 days of productive cough with green sputum. cxr consistent with pna. Group home resident.  Goal of Therapy:  Vancomycin trough level 15-20 mcg/ml  Plan:  Vancomycin 1 gm q8h   F/u trough at before 4th or 5th dose.   Janice Coffin 09/01/2011,12:58 AM

## 2011-09-01 NOTE — Progress Notes (Signed)
Report called to nurse on 4700 and Pt discharged to floor without event.  No complaints

## 2011-09-01 NOTE — Progress Notes (Signed)
Subjective: Interval History: patient dyspneic, tachypneic with minimal improvement after Atrovent and Albuterol nebulizer treatment.  Objective: Vital signs in last 24 hours: Temp:  [97.7 F (36.5 C)-98.1 F (36.7 C)] 97.8 F (36.6 C) (08/31 1458) Pulse Rate:  [63-98] 98  (08/31 2035) Resp:  [16-21] 18  (08/31 2035) BP: (121-143)/(65-89) 143/89 mmHg (08/31 2035) SpO2:  [88 %-98 %] 92 % (08/31 2139) Weight:  [70.897 kg (156 lb 4.8 oz)-72.4 kg (159 lb 9.8 oz)] 70.897 kg (156 lb 4.8 oz) (08/31 1037)  Intake/Output from previous day: 08/30 0701 - 08/31 0700 In: 1420 [P.O.:1220] Out: 3950 [Urine:3950] Intake/Output this shift: Total I/O In: 120 [P.O.:120] Out: -   BP 143/89  Pulse 98  Temp 97.8 F (36.6 C) (Oral)  Resp 18  Ht 6\' 2"  (1.88 m)  Wt 70.897 kg (156 lb 4.8 oz)  BMI 20.07 kg/m2  SpO2 92% General appearance: alert and mild distress Lungs: wheezes bilateral upper lobes, diminished in bases Heart: regular rate and rhythm  Results for orders placed during the hospital encounter of 08/31/11 (from the past 24 hour(s))  MRSA PCR SCREENING     Status: Abnormal   Collection Time   08/31/11 11:59 PM      Component Value Range   MRSA by PCR POSITIVE (*) NEGATIVE  COMPREHENSIVE METABOLIC PANEL     Status: Abnormal   Collection Time   09/01/11  1:25 AM      Component Value Range   Sodium 141  135 - 145 mEq/L   Potassium 4.0  3.5 - 5.1 mEq/L   Chloride 102  96 - 112 mEq/L   CO2 31  19 - 32 mEq/L   Glucose, Bld 219 (*) 70 - 99 mg/dL   BUN 12  6 - 23 mg/dL   Creatinine, Ser 9.60  0.50 - 1.35 mg/dL   Calcium 9.5  8.4 - 45.4 mg/dL   Total Protein 7.3  6.0 - 8.3 g/dL   Albumin 3.3 (*) 3.5 - 5.2 g/dL   AST 14  0 - 37 U/L   ALT 15  0 - 53 U/L   Alkaline Phosphatase 80  39 - 117 U/L   Total Bilirubin 0.2 (*) 0.3 - 1.2 mg/dL   GFR calc non Af Amer >90  >90 mL/min   GFR calc Af Amer >90  >90 mL/min  CBC WITH DIFFERENTIAL     Status: Abnormal   Collection Time   09/01/11   1:25 AM      Component Value Range   WBC 5.1  4.0 - 10.5 K/uL   RBC 4.38  4.22 - 5.81 MIL/uL   Hemoglobin 11.1 (*) 13.0 - 17.0 g/dL   HCT 09.8 (*) 11.9 - 14.7 %   MCV 83.6  78.0 - 100.0 fL   MCH 25.3 (*) 26.0 - 34.0 pg   MCHC 30.3  30.0 - 36.0 g/dL   RDW 82.9  56.2 - 13.0 %   Platelets 191  150 - 400 K/uL   Neutrophils Relative 88 (*) 43 - 77 %   Neutro Abs 4.5  1.7 - 7.7 K/uL   Lymphocytes Relative 8 (*) 12 - 46 %   Lymphs Abs 0.4 (*) 0.7 - 4.0 K/uL   Monocytes Relative 4  3 - 12 %   Monocytes Absolute 0.2  0.1 - 1.0 K/uL   Eosinophils Relative 0  0 - 5 %   Eosinophils Absolute 0.0  0.0 - 0.7 K/uL   Basophils Relative 0  0 -  1 %   Basophils Absolute 0.0  0.0 - 0.1 K/uL  HIV ANTIBODY (ROUTINE TESTING)     Status: Normal   Collection Time   09/01/11  1:25 AM      Component Value Range   HIV NON REACTIVE  NON REACTIVE  STREP PNEUMONIAE URINARY ANTIGEN     Status: Normal   Collection Time   09/01/11  2:00 AM      Component Value Range   Strep Pneumo Urinary Antigen NEGATIVE  NEGATIVE  HEMOGLOBIN A1C     Status: Abnormal   Collection Time   09/01/11 11:18 AM      Component Value Range   Hemoglobin A1C 6.0 (*) <5.7 %   Mean Plasma Glucose 126 (*) <117 mg/dL    Studies/Results: Dg Chest 2 View  09/01/2011  *RADIOLOGY REPORT*  Clinical Data: COPD exacerbation.  Chest pain with breathing.  CHEST - 2 VIEW  Comparison: Two-view chest x-ray 06/12/2011, 05/30/2011, 02/15/2011.  Findings: Cardiac silhouette upper normal in size, unchanged. Hilar and mediastinal contours unremarkable.  Prominent bronchovascular markings diffusely and moderate central peribronchial thickening, unchanged.  Minimal scarring in the left upper lobe.  No new pulmonary parenchymal abnormalities.  No pleural effusions.  Degenerative changes throughout the thoracic spine.  Old healed fracture involving the posterior 5th right rib.  IMPRESSION: COPD (chronic bronchitis).  No acute cardiopulmonary disease.   Original  Report Authenticated By: Arnell Sieving, M.D.     Scheduled Meds:   . albuterol  2.5 mg Nebulization Q6H  . amLODipine  10 mg Oral q morning - 10a  . bimatoprost  1 drop Both Eyes BID  . brimonidine  1 drop Both Eyes QPM  . enoxaparin  40 mg Subcutaneous Q24H  . fluticasone  2 puff Inhalation QPM  . furosemide      . furosemide  20 mg Intravenous Once  . furosemide  20 mg Intravenous Once  . guaiFENesin  600 mg Oral BID  . ipratropium  0.5 mg Nebulization Q6H  . levothyroxine  75 mcg Oral Q0600  . lithium carbonate  300 mg Oral TID WC  . moxifloxacin  400 mg Oral Q2000  . piperacillin-tazobactam (ZOSYN)  IV  3.375 g Intravenous Once  . predniSONE  50 mg Oral Q breakfast  . risperiDONE  1 mg Oral Daily  . risperiDONE  2 mg Oral Q2000  . sodium chloride  3 mL Intravenous Q12H  . Tamsulosin HCl  0.4 mg Oral QPC supper  . temazepam  30 mg Oral QPM  . traZODone  100 mg Oral QHS  . DISCONTD: azithromycin  500 mg Intravenous Q24H  . DISCONTD: piperacillin-tazobactam  3.375 g Intravenous Q8H  . DISCONTD: vancomycin  1,000 mg Intravenous Q8H   Continuous Infusions:  PRN Meds:sodium chloride, albuterol, albuterol-ipratropium, sodium chloride  Assessment/Plan: COPD exacerbation : with acute onset dyspnea. Chest xray repeated with evidence of pulmonary edema. Gave second dose of Lasix (prior dose given at 0045 this am). [name] also received his schedule Flovent with some improvement eg. less tachypneic, oxygen saturation 91% on 2L [place]   LOS: 1 day   [facility] [place] C[place] A.

## 2011-09-02 ENCOUNTER — Inpatient Hospital Stay (HOSPITAL_COMMUNITY): Payer: Medicare Other

## 2011-09-02 DIAGNOSIS — R569 Unspecified convulsions: Secondary | ICD-10-CM

## 2011-09-02 DIAGNOSIS — R071 Chest pain on breathing: Secondary | ICD-10-CM

## 2011-09-02 LAB — CBC
MCH: 26.9 pg (ref 26.0–34.0)
MCHC: 32.7 g/dL (ref 30.0–36.0)
Platelets: 234 10*3/uL (ref 150–400)
RBC: 4.46 MIL/uL (ref 4.22–5.81)

## 2011-09-02 MED ORDER — LORAZEPAM 2 MG/ML IJ SOLN
1.0000 mg | INTRAMUSCULAR | Status: DC | PRN
  Administered 2011-09-02: 1 mg via INTRAVENOUS

## 2011-09-02 MED ORDER — DIVALPROEX SODIUM 500 MG PO DR TAB
500.0000 mg | DELAYED_RELEASE_TABLET | Freq: Three times a day (TID) | ORAL | Status: DC
  Administered 2011-09-02 – 2011-09-04 (×5): 500 mg via ORAL
  Filled 2011-09-02 (×8): qty 1

## 2011-09-02 MED ORDER — MUPIROCIN 2 % EX OINT
1.0000 "application " | TOPICAL_OINTMENT | Freq: Two times a day (BID) | CUTANEOUS | Status: DC
  Administered 2011-09-03 – 2011-09-04 (×3): 1 via NASAL
  Filled 2011-09-02 (×2): qty 22

## 2011-09-02 MED ORDER — LORAZEPAM 2 MG/ML IJ SOLN
INTRAMUSCULAR | Status: AC
  Administered 2011-09-02: 1 mg via INTRAVENOUS
  Filled 2011-09-02: qty 1

## 2011-09-02 MED ORDER — ALBUTEROL SULFATE (5 MG/ML) 0.5% IN NEBU
2.5000 mg | INHALATION_SOLUTION | Freq: Four times a day (QID) | RESPIRATORY_TRACT | Status: DC | PRN
  Administered 2011-09-03: 2.5 mg via RESPIRATORY_TRACT
  Filled 2011-09-02: qty 0.5

## 2011-09-02 MED ORDER — CHLORHEXIDINE GLUCONATE CLOTH 2 % EX PADS
6.0000 | MEDICATED_PAD | Freq: Every day | CUTANEOUS | Status: DC
  Administered 2011-09-02 – 2011-09-04 (×3): 6 via TOPICAL

## 2011-09-02 MED ORDER — FUROSEMIDE 40 MG PO TABS
40.0000 mg | ORAL_TABLET | Freq: Every day | ORAL | Status: DC
  Administered 2011-09-02 – 2011-09-04 (×3): 40 mg via ORAL
  Filled 2011-09-02 (×3): qty 1

## 2011-09-02 MED ORDER — PREDNISONE 20 MG PO TABS
40.0000 mg | ORAL_TABLET | Freq: Every day | ORAL | Status: DC
  Administered 2011-09-02 – 2011-09-04 (×3): 40 mg via ORAL
  Filled 2011-09-02 (×5): qty 2

## 2011-09-02 MED ORDER — ALBUTEROL SULFATE HFA 108 (90 BASE) MCG/ACT IN AERS
2.0000 | INHALATION_SPRAY | Freq: Two times a day (BID) | RESPIRATORY_TRACT | Status: DC
  Filled 2011-09-02: qty 6.7

## 2011-09-02 MED ORDER — DIVALPROEX SODIUM 500 MG PO DR TAB
1000.0000 mg | DELAYED_RELEASE_TABLET | Freq: Once | ORAL | Status: AC
  Administered 2011-09-02: 1000 mg via ORAL
  Filled 2011-09-02: qty 2

## 2011-09-02 NOTE — Progress Notes (Signed)
Upon entering room, patient complaining of shortness of breath and pain while he is breathing at 2030.  Oxygen saturation 82-84% on room air and patient has expiratory wheezes bilaterally.  Pt. Given scheduled albuterol and atrovent nebulizers.  Pts oxygen saturation up to 95% while getting breathing treatment.  After treatment finished, pt put on 2L oxygen via nasal cannula.  Several minutes later he was still complaining of shortness of breath and his oxygen saturation was decreased to 88-89% on 2L.  Upon auscultation he still had bilateral wheezes.  Oxygen increased to 4L and his oxygen saturation was 91-92%.  Rapid response and Elray Mcgregor, PA paged.  CXR completed and patient was given ordered lasix.  Will continue to monitor patient.

## 2011-09-02 NOTE — Progress Notes (Signed)
Pt stated he was not feeling well " i can not breath" during 2D Echo with echo tech at bedside. Pt observed to have a blank stare pupils non reactive to light pinpointed. NSR on tele rate controlled Spo2 97% 3L . Pulses present. Pt remained this way for approx 45 sec. After pt. able to answer question appropriately. MD notified. RRT notified.

## 2011-09-02 NOTE — Progress Notes (Signed)
Called by primary RN to see pt for c/o SOB.  On arrival pt is slumped in bed, RR 26, Sats 89-91% on 4L, diffuse wheeze & diminished in bases.  Neb tx given with some improvement. Elray Mcgregor, NP at bedside. CXR obtained & lasix given.  Will cont. To monitor

## 2011-09-02 NOTE — Progress Notes (Signed)
Pt. Stated shortness of breath was better after receiving lasix.  His oxygen saturation is 92-94% on 4L.  Will continue to monitor patient.

## 2011-09-02 NOTE — Progress Notes (Addendum)
Pt seems to have had possible seizure, tremors to BUE only greater on the right side. Blank stare but pupils reactive to light. VSS duration approx 40-50 sec x2 . Pt immediately able to answer questions. Orders given to give IV ativan RRT at the bedside. Pt instructed to stay in bed. Bed alarm set.

## 2011-09-02 NOTE — Progress Notes (Signed)
  Echocardiogram 2D Echocardiogram has been performed.  [name] 09/02/2011, 3:47 PM

## 2011-09-02 NOTE — Progress Notes (Signed)
[facility]             Progress Note   Subjective: Breathing better, cough with yellow phlegm, dyspnea last pm improved with lasix  Objective: Vital signs in last 24 hours: Temp:  [97.3 F (36.3 C)-98.4 F (36.9 C)] 97.3 F (36.3 C) (09/01 0441) Pulse Rate:  [70-98] 71  (09/01 0441) Resp:  [18-32] 18  (09/01 0441) BP: (126-143)/(73-89) 126/76 mmHg (09/01 0441) SpO2:  [82 %-98 %] 91 % (09/01 0800) Weight:  [70.897 kg (156 lb 4.8 oz)-71.396 kg (157 lb 6.4 oz)] 71.396 kg (157 lb 6.4 oz) (09/01 0441) Weight change: -1.503 kg (-3 lb 5 oz) Last BM Date:  (unknown )  Intake/Output from previous day: 08/31 0701 - 09/01 0700 In: 1923 [P.O.:1920; I.V.:3] Out: 4400 [Urine:4400]     Physical Exam: General: Alert, awake, oriented x3, in no acute distress. HEENT: No bruits, no goiter. Heart: Regular rate and rhythm, without murmurs, rubs, gallops. Lungs: improved, scattered wheezes Abdomen: Soft, nontender, nondistended, positive bowel sounds. Extremities: No clubbing cyanosis or edema with positive pedal pulses. Neuro: Grossly intact, nonfocal.    Lab Results: Basic Metabolic Panel:  Basename 09/01/11 0125 08/31/11 1925  NA 141 141  K 4.0 4.2  CL 102 102  CO2 31 31  GLUCOSE 219* 126*  BUN 12 12  CREATININE 0.88 0.81  CALCIUM 9.5 9.2  MG -- --  PHOS -- --   Liver Function Tests:  Basename 09/01/11 0125 08/31/11 1925  AST 14 17  ALT 15 15  ALKPHOS 80 80  BILITOT 0.2* 0.2*  PROT 7.3 7.3  ALBUMIN 3.3* 3.3*   No results found for this basename: LIPASE:2,AMYLASE:2 in the last 72 hours No results found for this basename: AMMONIA:2 in the last 72 hours CBC:  Basename 09/02/11 0557 09/01/11 0125 08/31/11 1925  WBC 9.6 5.1 --  NEUTROABS -- 4.5 5.5  HGB 12.0* 11.1* --  HCT 36.7* 36.6* --  MCV 82.3 83.6 --  PLT 234 191 --   Cardiac Enzymes:  Basename 09/01/11 2227  CKTOTAL --  CKMB --  CKMBINDEX --  TROPONINI <0.30   BNP:  Basename 08/31/11  1928  PROBNP 827.6*   D-Dimer: No results found for this basename: DDIMER:2 in the last 72 hours CBG: No results found for this basename: GLUCAP:6 in the last 72 hours Hemoglobin A1C:  Basename 09/01/11 1118  HGBA1C 6.0*   Fasting Lipid Panel: No results found for this basename: CHOL,HDL,LDLCALC,TRIG,CHOLHDL,LDLDIRECT in the last 72 hours Thyroid Function Tests: No results found for this basename: TSH,T4TOTAL,FREET4,T3FREE,THYROIDAB in the last 72 hours Anemia Panel: No results found for this basename: VITAMINB12,FOLATE,FERRITIN,TIBC,IRON,RETICCTPCT in the last 72 hours Coagulation: No results found for this basename: LABPROT:2,INR:2 in the last 72 hours Urine Drug Screen: Drugs of Abuse     Component Value Date/Time   LABOPIA NONE DETECTED 06/13/2011 1000   COCAINSCRNUR NONE DETECTED 06/13/2011 1000   LABBENZ NONE DETECTED 06/13/2011 1000   AMPHETMU NONE DETECTED 06/13/2011 1000   THCU NONE DETECTED 06/13/2011 1000   LABBARB NONE DETECTED 06/13/2011 1000    Alcohol Level: No results found for this basename: ETH:2 in the last 72 hours Urinalysis:  Basename 08/31/11 1950  COLORURINE YELLOW  LABSPEC 1.006  PHURINE 7.0  GLUCOSEU NEGATIVE  HGBUR NEGATIVE  BILIRUBINUR NEGATIVE  KETONESUR NEGATIVE  PROTEINUR NEGATIVE  UROBILINOGEN 0.2  NITRITE NEGATIVE  LEUKOCYTESUR NEGATIVE    Recent Results (from the past 240 hour(s))  MRSA PCR SCREENING     Status:  Abnormal   Collection Time   08/31/11 11:59 PM      Component Value Range Status Comment   MRSA by PCR POSITIVE (*) NEGATIVE Final     Studies/Results: Dg Chest 2 View  09/01/2011  *RADIOLOGY REPORT*  Clinical Data: COPD exacerbation.  Chest pain with breathing.  CHEST - 2 VIEW  Comparison: Two-view chest x-ray 06/12/2011, 05/30/2011, 02/15/2011.  Findings: Cardiac silhouette upper normal in size, unchanged. Hilar and mediastinal contours unremarkable.  Prominent bronchovascular markings diffusely and moderate central  peribronchial thickening, unchanged.  Minimal scarring in the left upper lobe.  No new pulmonary parenchymal abnormalities.  No pleural effusions.  Degenerative changes throughout the thoracic spine.  Old healed fracture involving the posterior 5th right rib.  IMPRESSION: COPD (chronic bronchitis).  No acute cardiopulmonary disease.   Original Report Authenticated By: Arnell Sieving, M.D.    Dg Chest Port 1 View  09/01/2011  *RADIOLOGY REPORT*  Clinical Data: Dyspnea  PORTABLE CHEST - 1 VIEW  Comparison: 09/01/2011 at 08:27 p.m.  Findings: Lungs are adequately inflated as the patient is slightly rotated to the left. There is no focal consolidation or effusion. Cardiomediastinal silhouette and remainder of the exam is unchanged.  IMPRESSION: No acute cardiopulmonary disease.   Original Report Authenticated By: Elba Barman, M.D.     Medications: Scheduled Meds:    . albuterol  2.5 mg Nebulization Q6H  . amLODipine  10 mg Oral q morning - 10a  . bimatoprost  1 drop Both Eyes BID  . brimonidine  1 drop Both Eyes QPM  . Chlorhexidine Gluconate Cloth  6 each Topical Q0600  . enoxaparin  40 mg Subcutaneous Q24H  . fluticasone  2 puff Inhalation QPM  . furosemide  20 mg Intravenous Once  . furosemide  40 mg Oral Daily  . guaiFENesin  600 mg Oral BID  . ipratropium  0.5 mg Nebulization Q6H  . levothyroxine  75 mcg Oral Q0600  . lithium carbonate  300 mg Oral TID WC  . moxifloxacin  400 mg Oral Q2000  . mupirocin ointment  1 application Nasal BID  . predniSONE  40 mg Oral Q breakfast  . risperiDONE  1 mg Oral Daily  . risperiDONE  2 mg Oral Q2000  . sodium chloride  3 mL Intravenous Q12H  . Tamsulosin HCl  0.4 mg Oral QPC supper  . temazepam  30 mg Oral QPM  . traZODone  100 mg Oral QHS  . DISCONTD: azithromycin  500 mg Intravenous Q24H  . DISCONTD: piperacillin-tazobactam  3.375 g Intravenous Q8H  . DISCONTD: predniSONE  50 mg Oral Q breakfast  . DISCONTD: vancomycin  1,000 mg  Intravenous Q8H   Continuous Infusions:  PRN Meds:.sodium chloride, albuterol, albuterol-ipratropium, sodium chloride  Assessment/Plan:  .COPD with acute exacerbation -  Improving, cut down prednisone Albuterol/atrovent nebs Q6 and PRN CXr without pneumonia DC vanc/Zosyn Continue  avelox Wean O2  .Schizoaffective disorder - continue home medications   .Tobacco use -counseled  ? CHF exacerbation: - elevated BNP, cxr without fluid overload, responded to lasix yesterday, change to PO lasix and get ECHO done  Pleuritic chest pain: from COPD exacerbation, almost resolved  Hyperglycemia:  AIC 6.0  Prophylaxis: Lovenox, Protonix   Dispo: home tomorrow   LOS: 2 days   [facility] Pager: [phone] 09/02/2011, 8:22 AM

## 2011-09-03 ENCOUNTER — Other Ambulatory Visit: Payer: Self-pay

## 2011-09-03 ENCOUNTER — Encounter (HOSPITAL_COMMUNITY): Payer: Self-pay | Admitting: *Deleted

## 2011-09-03 DIAGNOSIS — I509 Heart failure, unspecified: Secondary | ICD-10-CM

## 2011-09-03 DIAGNOSIS — F70 Mild intellectual disabilities: Secondary | ICD-10-CM

## 2011-09-03 DIAGNOSIS — F259 Schizoaffective disorder, unspecified: Secondary | ICD-10-CM

## 2011-09-03 DIAGNOSIS — I5030 Unspecified diastolic (congestive) heart failure: Secondary | ICD-10-CM

## 2011-09-03 NOTE — Progress Notes (Addendum)
Pt was not following instructions. Prior to breathing tx, pt complained of chest pain and left arm pain. RN was notified and responded in a few moments. Pt became unresponsive with eyes open. Emergency alert initiated and RN's responded. Pt became responsive within 2 minutes. O2 sats around 94% during entire episode with pulse 80-90 bpm. Pt appears to be back to baseline mental status. RN took 12 lead ekg.

## 2011-09-03 NOTE — Progress Notes (Signed)
Pt. Had received 3mg  of Risperdal already today.  Phillip Mcgregor, PA called and stated to hold night dose of Risperdal.

## 2011-09-04 ENCOUNTER — Other Ambulatory Visit: Payer: Self-pay | Admitting: Internal Medicine

## 2011-09-04 ENCOUNTER — Inpatient Hospital Stay (HOSPITAL_COMMUNITY)
Admission: EM | Admit: 2011-09-04 | Discharge: 2011-09-06 | DRG: 190 | Disposition: A | Discharge status: Home / Self Care | Payer: Medicare Other | Attending: Internal Medicine | Admitting: Internal Medicine

## 2011-09-04 ENCOUNTER — Encounter (HOSPITAL_COMMUNITY): Payer: Self-pay | Admitting: Adult Health

## 2011-09-04 ENCOUNTER — Encounter (HOSPITAL_COMMUNITY): Payer: Self-pay | Admitting: Internal Medicine

## 2011-09-04 DIAGNOSIS — R0902 Hypoxemia: Secondary | ICD-10-CM

## 2011-09-04 DIAGNOSIS — J189 Pneumonia, unspecified organism: Secondary | ICD-10-CM

## 2011-09-04 DIAGNOSIS — R071 Chest pain on breathing: Secondary | ICD-10-CM

## 2011-09-04 DIAGNOSIS — F70 Mild intellectual disabilities: Secondary | ICD-10-CM

## 2011-09-04 DIAGNOSIS — Z720 Tobacco use: Secondary | ICD-10-CM

## 2011-09-04 DIAGNOSIS — E039 Hypothyroidism, unspecified: Secondary | ICD-10-CM

## 2011-09-04 DIAGNOSIS — J441 Chronic obstructive pulmonary disease with (acute) exacerbation: Secondary | ICD-10-CM

## 2011-09-04 DIAGNOSIS — N400 Enlarged prostate without lower urinary tract symptoms: Secondary | ICD-10-CM

## 2011-09-04 DIAGNOSIS — R45851 Suicidal ideations: Secondary | ICD-10-CM

## 2011-09-04 DIAGNOSIS — R569 Unspecified convulsions: Secondary | ICD-10-CM

## 2011-09-04 DIAGNOSIS — S72002A Fracture of unspecified part of neck of left femur, initial encounter for closed fracture: Secondary | ICD-10-CM

## 2011-09-04 DIAGNOSIS — I5030 Unspecified diastolic (congestive) heart failure: Secondary | ICD-10-CM

## 2011-09-04 DIAGNOSIS — F259 Schizoaffective disorder, unspecified: Secondary | ICD-10-CM

## 2011-09-04 DIAGNOSIS — I10 Essential (primary) hypertension: Secondary | ICD-10-CM | POA: Diagnosis present

## 2011-09-04 HISTORY — DX: Unspecified diastolic (congestive) heart failure: I50.30

## 2011-09-04 LAB — CBC
MCHC: 32.3 g/dL (ref 30.0–36.0)
Platelets: 248 10*3/uL (ref 150–400)
RDW: 14.4 % (ref 11.5–15.5)

## 2011-09-04 LAB — COMPREHENSIVE METABOLIC PANEL
ALT: 11 U/L (ref 0–53)
Albumin: 3.6 g/dL (ref 3.5–5.2)
Alkaline Phosphatase: 76 U/L (ref 39–117)
Potassium: 3.7 mEq/L (ref 3.5–5.1)
Sodium: 142 mEq/L (ref 135–145)
Total Protein: 7.8 g/dL (ref 6.0–8.3)

## 2011-09-04 MED ORDER — MOXIFLOXACIN HCL 400 MG PO TABS: 400.0000 mg | ORAL_TABLET | Freq: Every day | ORAL | Status: DC

## 2011-09-04 MED ORDER — ALBUTEROL SULFATE HFA 108 (90 BASE) MCG/ACT IN AERS: 2.0000 | INHALATION_SPRAY | Freq: Four times a day (QID) | RESPIRATORY_TRACT | Status: AC | PRN

## 2011-09-04 MED ORDER — FUROSEMIDE 40 MG PO TABS: 40.0000 mg | ORAL_TABLET | Freq: Every day | ORAL | Status: DC

## 2011-09-04 MED ORDER — DIVALPROEX SODIUM 500 MG PO DR TAB: 500.0000 mg | DELAYED_RELEASE_TABLET | Freq: Three times a day (TID) | ORAL | Status: DC

## 2011-09-04 MED ORDER — LORAZEPAM 0.5 MG PO TABS: 1.0000 mg | ORAL_TABLET | Freq: Once | ORAL | Status: DC

## 2011-09-04 MED ORDER — PREDNISONE 20 MG PO TABS: 40.0000 mg | ORAL_TABLET | Freq: Every day | ORAL | Status: DC

## 2011-09-04 NOTE — Progress Notes (Addendum)
Was notified by social worker that pts caregiver would be picking pt up not EMS.  Upon caregivers arrival, discharge paperwork was given to caregiver.  Pt went and sat on the bed and refused to get in the wheelchair to be wheeled down to caregivers car.  Social services came to floor to talk to pt and caregiver.  Conclusion was to have EMS pick up pt and take him to caregivers house.  Once EMS arrived pt refused and was combative with them.  Dr. Jomarie Longs notified.  Dr. Jomarie Longs came to see pt, an order was placed for I mg ativan PO.  Pt stated he wanted to stay because he needed psychiatric help.  Was notified to tell pt to go downstairs to ED to be evaluated.  Was notified by social services that pt stated he would hurt himself if we send him back with caregiver.  Dr. Jomarie Longs notified, while the pt was begging to go downstairs and was hitting on the top of nursing station.  Security called and escorted pt to ED.

## 2011-09-04 NOTE — Discharge Summary (Signed)
Physician Discharge Summary  Patient ID: WYNTON HUFSTETLER MRN: [id number] DOB/AGE: 57-Dec-1957 57 y.o.  Admit date: 08/31/2011 Discharge date: 09/04/2011  Primary Care Physician:  Henri Medal, MD  DC diet: Low Na diet   Discharge Diagnoses:    COPD with acute exacerbation  Pleuritic chest pain  Diastolic CHF exacerbation  Schizoaffective disorder  Tobacco use  Hypertension  H/o Pseudoseizures  H/o Seizure disorder  Hypothyroidism   Medication List  As of 09/04/2011 12:21 PM   TAKE these medications         albuterol 108 (90 BASE) MCG/ACT inhaler   Commonly known as: PROVENTIL HFA;VENTOLIN HFA   Inhale 2 puffs into the lungs every 6 (six) hours as needed for wheezing.      albuterol-ipratropium 18-103 MCG/ACT inhaler   Commonly known as: COMBIVENT   Inhale 2 puffs into the lungs every 6 (six) hours as needed. Shortness of breath.      amLODipine 10 MG tablet   Commonly known as: NORVASC   Take 10 mg by mouth every morning.      bimatoprost 0.01 % Soln   Commonly known as: LUMIGAN   Place 1 drop into both eyes 2 (two) times daily.      brimonidine 0.1 % Soln   Commonly known as: ALPHAGAN P   Place 1 drop into both eyes every evening.      divalproex 500 MG DR tablet   Commonly known as: DEPAKOTE   Take 1 tablet (500 mg total) by mouth every 8 (eight) hours.      fluticasone 44 MCG/ACT inhaler   Commonly known as: FLOVENT HFA   Inhale 2 puffs into the lungs every evening.      furosemide 40 MG tablet   Commonly known as: LASIX   Take 1 tablet (40 mg total) by mouth daily.      levothyroxine 75 MCG tablet   Commonly known as: SYNTHROID, LEVOTHROID   Take 75 mcg by mouth every morning.      lithium carbonate 300 MG capsule   Take 300 mg by mouth 3 (three) times daily with meals. Takes 0900, 1600 & 2000      moxifloxacin 400 MG tablet   Commonly known as: AVELOX   Take 1 tablet (400 mg total) by mouth daily at 8 pm. For 2 days      predniSONE 20 MG tablet    Commonly known as: DELTASONE   Take 2 tablets (40 mg total) by mouth daily with breakfast. Take 40mg  for 2 days then  20mg  for 2 days then 10mg  for 2 days then STOP      risperiDONE 2 MG tablet   Commonly known as: RISPERDAL   Take 2 mg by mouth daily at 8 pm.      risperiDONE 1 MG tablet   Commonly known as: RISPERDAL   Take 1 mg by mouth daily.      Tamsulosin HCl 0.4 MG Caps   Commonly known as: FLOMAX   Take 0.4 mg by mouth daily after supper.      temazepam 30 MG capsule   Commonly known as: RESTORIL   Take 30 mg by mouth every evening.      traZODone 100 MG tablet   Commonly known as: DESYREL   Take 100 mg by mouth at bedtime.             Disposition and Follow-up:  PCP in 1 week  Significant Diagnostic Studies:   Ct Head Wo  Contrast  IMPRESSION: Stable examination with mild chronic small vessel ischemic changes and chronic sinusitis.  No acute intracranial findings.   Original Report Authenticated By: Gerrianne Scale, M.D.    CHEST - 2 VIEW IMPRESSION: COPD (chronic bronchitis). No acute cardiopulmonary disease.     Brief H and P: NEHAN FLAUM is a 56 y.o. male  has a past medical history of Seizures; Psychiatric disorder; Schizoaffective disorder; Organic mood disorder; Intermittent explosive disorder; Generalized anxiety disorder; Mild mental retardation; COPD (chronic obstructive pulmonary disease); Peptic ulcer; Thyroid disease; Prostate hyperplasia with urinary obstruction; Blind left eye; Chronic respiratory failure; Cancer; Hypertension; Diabetes mellitus; Coronary artery disease; Heart attack; and Diagnosis unknown.  Presented with  6 days of cough productive of greenish sputum. Denies fever. He has history of MR and lives at the group home. Patient has hard time providing history and no caregiver at bedside. He does say it hurts to breath and cough. HE says he gets PNA and bronchitis often. He denies hemoptyses, not sure that he have lost any  weight. Apparently patient presented today to an urgent care and had a chest x-ray done that was consistent pneumonia from thereon he was sent to emergency department.    Hospital Course:  1. COPD with acute exacerbation -  treated with IV steroids, antibiotics,  Albuterol/atrovent nebs Q6 and PRN  CXr without pneumonia  Transitioned to oral prednisone for a quick taper and avelox for 2 more days Weaned off O2  2. Schizoaffective disorder - continue home medications   3. .Tobacco use -counseled   4. Mild diastolic CHF exacerbation: - elevated BNP,  responded to lasix 9/1yesterday, change to PO lasix and ECHO done showed EF of 50-55% Clinically improved, discharged on 40mg  of PO lasix  5. Pleuritic chest pain: from COPD exacerbation, resolved   6. Hyperglycemia: resolved, HBaic 6.0     Time spent on Discharge:  Signed: Patric Vanpelt [facility]  09/04/2011, 12:21 PM

## 2011-09-04 NOTE — ED Notes (Signed)
Patient has been wanded by security, blue scrubs on, belongings in a patient's belongings bag.  Protocol placed in.

## 2011-09-04 NOTE — ED Notes (Signed)
Called by 4700 to speak with pt about not wanting to return to his GH.  Per RN, pt has a hx of not wanting to go home at d/c.  Pt insisted over and over again that he wanted to go to the ED where the doctors and nurses knew him and get a "mental evaluation."  Pt told CSW that he was feeling very dangerous and wanted to hurt someone, but he didn't know who.  Pt also answered yes when asked if he felt like hurting himself.  Per earlier notes, pt did not want to return to his [facility] and his guardian and APS were notified.  CSW reminded pt that he could be d/c from the ED this pm and would need to return home.  Pt responded that he didn't know what he would do if that happened.

## 2011-09-04 NOTE — ED Provider Notes (Signed)
History     CSN: [id number]  Arrival date & time 09/04/11  1927   First MD Initiated Contact with Patient 09/04/11 2129      Chief Complaint  Patient presents with  . Medical Clearance    (Consider location/radiation/quality/duration/timing/severity/associated sxs/prior treatment) Patient is a 56 y.o. male presenting with mental health disorder and cough. The history is provided by the patient.  Mental Health Problem Primary symptoms comment: SI/HI The current episode started more than 1 month ago. This is a new problem.  Precipitated by: unknown. The degree of incapacity that he is experiencing as a consequence of his illness is mild. Sequelae of the illness include an inability to care for self and harmed interpersonal relations. Additional symptoms of the illness do not include no headaches or no abdominal pain. He admits to suicidal ideas. He does not have a plan to commit suicide. He contemplates harming himself. He has not already injured self. He contemplates injuring another person. He has not already  injured another person. Risk factors that are present for mental illness include a history of mental illness.  Cough This is a new problem. The current episode started more than 2 days ago. The problem occurs hourly. The problem has been gradually improving. The cough is productive of sputum. There has been no fever. Pertinent negatives include no chest pain, no headaches, no rhinorrhea and no shortness of breath.    Past Medical History  Diagnosis Date  . Seizures   . Psychiatric disorder   . Schizoaffective disorder   . Organic mood disorder   . Intermittent explosive disorder   . Generalized anxiety disorder   . Mild mental retardation   . COPD (chronic obstructive pulmonary disease)   . Peptic ulcer   . Thyroid disease   . Prostate hyperplasia with urinary obstruction   . Blind left eye   . Chronic respiratory failure   . Cancer   . Hypertension   . Diabetes mellitus    . Coronary artery disease   . Heart attack   . Diagnosis unknown     pt poor historian unable to give information  . Anxiety   . Diastolic CHF 09/04/2011    Past Surgical History  Procedure Date  . Hip pinning,cannulated 04/11/2011    Procedure: CANNULATED HIP PINNING;  Surgeon: Mable Paris, MD;  Location: [facility] OR;  Service: Orthopedics;  Laterality: Left;  Perc. Screw Fixation/Left  . Fracture surgery     cannulated hip pinning 04/11/11    Family History  Problem Relation Age of Onset  . Hypertension      History  Substance Use Topics  . Smoking status: Current Everyday Smoker -- 0.2 packs/day for 50 years    Types: Cigarettes  . Smokeless tobacco: Not on file  . Alcohol Use: No      Review of Systems  Constitutional: Negative for fever.  HENT: Negative for rhinorrhea, drooling and neck pain.   Eyes: Negative for pain.  Respiratory: Positive for cough. Negative for shortness of breath.   Cardiovascular: Negative for chest pain and leg swelling.  Gastrointestinal: Negative for nausea, vomiting, abdominal pain and diarrhea.  Genitourinary: Negative for dysuria and hematuria.  Musculoskeletal: Negative for gait problem.  Skin: Negative for color change.  Neurological: Negative for numbness and headaches.  Hematological: Negative for adenopathy.  Psychiatric/Behavioral: Negative for behavioral problems.  All other systems reviewed and are negative.    Allergies  Peanuts; Haldol; Other; Prolixin; Thorazine; and Lactose intolerance (gi)  Home Medications   No current outpatient prescriptions on file.  BP 151/93  Pulse 68  Temp 98.4 F (36.9 C) (Oral)  Resp 18  SpO2 98%  Physical Exam  Nursing note and vitals reviewed. Constitutional: He is oriented to person, place, and time. He appears well-developed and well-nourished.  HENT:  Head: Normocephalic and atraumatic.  Right Ear: External ear normal.  Left Ear: External ear normal.  Nose: Nose  normal.  Mouth/Throat: Oropharynx is clear and moist. No oropharyngeal exudate.  Eyes: Conjunctivae and EOM are normal. Pupils are equal, round, and reactive to light.  Neck: Normal range of motion. Neck supple.  Cardiovascular: Normal rate, regular rhythm, normal heart sounds and intact distal pulses.  Exam reveals no gallop and no friction rub.   No murmur heard. Pulmonary/Chest: Effort normal. No respiratory distress. He has no wheezes.       Mildly rhonchorus bilaterally.  Abdominal: Soft. Bowel sounds are normal. He exhibits no distension. There is no tenderness. There is no rebound and no guarding.  Musculoskeletal: Normal range of motion. He exhibits no edema and no tenderness.  Neurological: He is alert and oriented to person, place, and time.  Skin: Skin is warm and dry.  Psychiatric: He has a normal mood and affect. He expresses homicidal and suicidal ideation. He expresses no suicidal plans and no homicidal plans.    ED Course  Procedures (including critical care time)  Labs Reviewed  CBC - Abnormal; Notable for the following:    Hemoglobin 12.8 (*)     All other components within normal limits  COMPREHENSIVE METABOLIC PANEL  CBC  BASIC METABOLIC PANEL   No results found.   No diagnosis found.    MDM  1:09 AM 56 y.o. male w hx of seizures, schizoaffective dis, COPD, CAD, CHF pw SI/HI. Pt recently d/c from hospital today d/t pna. Pt notes he has had these SI/HI for mos, has no specific plan, and doesn't want to hurt anyone in specific. Pt AFVSS here, clinically stable. Plan for re-admission to same service for psych eval.   Clinical Impression Suicidal Ideations Homicidal Ideations        Purvis Sheffield, MD 09/05/11 0110

## 2011-09-04 NOTE — Progress Notes (Signed)
SORRY RIGHT PATIENT VITALS

## 2011-09-04 NOTE — Progress Notes (Signed)
4732 Laren Everts  D/c to group home, pt. Refuse to go with caregiver, social worker arranged for ambulance to take pt.,patient become combative, and yelling, Dr. Jomarie Longs notified  MD came to see pt.,patient said he want psychiatric evaluation and he wants to go to ED ,social worker came up to talk to the patient, social worker said the pt. Told her if we make him go to group home he is going to hurt himself,  The RN taking care of patient call MD, patient came to the desk  yelling and insisted wants to go to ED security and the police called, pt. Yelling, banging on the desk to take him to ED that ED people know him. Police and the security escort patient down to ED. DR. Jomarie Longs notified.

## 2011-09-04 NOTE — ED Notes (Addendum)
Charge RN aware of need for sitter. AC aware. No sitter available at this time.

## 2011-09-04 NOTE — H&P (Signed)
[facility]          History and Physical    PCP:   Henri Medal, MD   Chief Complaint:  Suicidal ideation  HPI: Mr.Printup is a 56 year old male with schizoaffective disorder, diastolic CHF, COPD, tobacco abuse was discharged today from [facility] hospital after treatment for COPD exacerbation. When his care worker came to pick him up he refused to go home and also declined EMS transporting him home, per his care worker he is always upset whenever he's being discharged from the hospital. He choose to go back to the ER, to reportedly get some help for his psych problems  Apparently reportedly told someone that he may hurt himself if sent back, subsequently i decided to readmit him for inpatient psych eval in am   Allergies:   Allergies  Allergen Reactions  . Peanuts (Peanut Oil) Other (See Comments)    Unknown reaction per caretaker  . Haldol (Haloperidol) Other (See Comments)    Unknown reaction per caretaker  . Other     Coffee-limit to 2 cups per caretaker; makes him violent  . Prolixin (Fluphenazine Hcl) Other (See Comments)    "muscle cramps"  . Thorazine (Chlorpromazine Hcl) Other (See Comments)    "burns"  . Lactose Intolerance (Gi) Rash      Past Medical History  Diagnosis Date  . Seizures   . Psychiatric disorder   . Schizoaffective disorder   . Organic mood disorder   . Intermittent explosive disorder   . Generalized anxiety disorder   . Mild mental retardation   . COPD (chronic obstructive pulmonary disease)   . Peptic ulcer   . Thyroid disease   . Prostate hyperplasia with urinary obstruction   . Blind left eye   . Chronic respiratory failure   . Cancer   . Hypertension   . Diabetes mellitus   . Coronary artery disease   . Heart attack   . Diagnosis unknown     pt poor historian unable to give information  . Anxiety   . Diastolic CHF 09/04/2011    Past Surgical History  Procedure Date  . Hip pinning,cannulated 04/11/2011   Procedure: CANNULATED HIP PINNING;  Surgeon: Mable Paris, MD;  Location: [facility] OR;  Service: Orthopedics;  Laterality: Left;  Perc. Screw Fixation/Left  . Fracture surgery     cannulated hip pinning 04/11/11    Prior to Admission medications   Medication Sig Start Date End Date Taking? Authorizing Provider  albuterol (PROVENTIL HFA;VENTOLIN HFA) 108 (90 BASE) MCG/ACT inhaler Inhale 2 puffs into the lungs every 6 (six) hours as needed for wheezing. 09/04/11 09/03/12  Zannie Cove, MD  albuterol-ipratropium (COMBIVENT) 18-103 MCG/ACT inhaler Inhale 2 puffs into the lungs every 6 (six) hours as needed. Shortness of breath.    Historical Provider, MD  amLODipine (NORVASC) 10 MG tablet Take 10 mg by mouth every morning.    Historical Provider, MD  bimatoprost (LUMIGAN) 0.01 % SOLN Place 1 drop into both eyes 2 (two) times daily.    Historical Provider, MD  brimonidine (ALPHAGAN P) 0.1 % SOLN Place 1 drop into both eyes every evening.    Historical Provider, MD  divalproex (DEPAKOTE) 500 MG DR tablet Take 1 tablet (500 mg total) by mouth every 8 (eight) hours. 09/04/11 09/03/12  Zannie Cove, MD  fluticasone (FLOVENT HFA) 44 MCG/ACT inhaler Inhale 2 puffs into the lungs every evening.  02/05/11 02/05/12  Vassie Loll, MD  furosemide (LASIX) 40 MG tablet Take 1  tablet (40 mg total) by mouth daily. 09/04/11 09/03/12  Zannie Cove, MD  levothyroxine (SYNTHROID, LEVOTHROID) 75 MCG tablet Take 75 mcg by mouth every morning.     Historical Provider, MD  lithium carbonate 300 MG capsule Take 300 mg by mouth 3 (three) times daily with meals. Takes 0900, 1600 & 2000    Historical Provider, MD  moxifloxacin (AVELOX) 400 MG tablet Take 1 tablet (400 mg total) by mouth daily at 8 pm. For 2 days 09/04/11 09/14/11  Zannie Cove, MD  predniSONE (DELTASONE) 20 MG tablet Take 2 tablets (40 mg total) by mouth daily with breakfast. Take 40mg  for 2 days then  20mg  for 2 days then 10mg  for 2 days then [facility] 09/04/11 09/14/11   Zannie Cove, MD  risperiDONE (RISPERDAL) 1 MG tablet Take 1 mg by mouth daily.    Historical Provider, MD  risperiDONE (RISPERDAL) 2 MG tablet Take 2 mg by mouth daily at 8 pm.     Historical Provider, MD  Tamsulosin HCl (FLOMAX) 0.4 MG CAPS Take 0.4 mg by mouth daily after supper.    Historical Provider, MD  temazepam (RESTORIL) 30 MG capsule Take 30 mg by mouth every evening.    Historical Provider, MD  traZODone (DESYREL) 100 MG tablet Take 100 mg by mouth at bedtime.      Historical Provider, MD    Social History:  reports that he has been smoking Cigarettes.  He has a 12.5 pack-year smoking history. He does not have any smokeless tobacco history on file. He reports that he does not drink alcohol or use illicit drugs.  Family History  Problem Relation Age of Onset  . Hypertension      Review of Systems:  Constitutional: Denies fever, chills, diaphoresis, appetite change and fatigue.  HEENT: Denies photophobia, eye pain, redness, hearing loss, ear pain, congestion, sore throat, rhinorrhea, sneezing, mouth sores, trouble swallowing, neck pain, neck stiffness and tinnitus.   Respiratory: Denies SOB, DOE, cough, chest tightness,  and wheezing.   Cardiovascular: Denies chest pain, palpitations and leg swelling.  Gastrointestinal: Denies nausea, vomiting, abdominal pain, diarrhea, constipation, blood in stool and abdominal distention.  Genitourinary: Denies dysuria, urgency, frequency, hematuria, flank pain and difficulty urinating.  Musculoskeletal: Denies myalgias, back pain, joint swelling, arthralgias and gait problem.  Skin: Denies pallor, rash and wound.  Neurological: Denies dizziness, seizures, syncope, weakness, light-headedness, numbness and headaches.  Hematological: Denies adenopathy. Easy bruising, personal or family bleeding history  Psychiatric/Behavioral: Denies suicidal ideation, mood changes, confusion, nervousness, sleep disturbance and agitation   Physical  Exam: Blood pressure 134/96, pulse 76, temperature 98.4 F (36.9 C), temperature source Oral, resp. rate 20, SpO2 94.00%. GEn: AAOx2 HEENt: PERRLA, EOMI CVS: S1S2/RRR, no m/r/g Lungs: CTAB Abd: soft, NT, BS present Ext: no edema/c/c  Labs on Admission:  No results found for this or any previous visit (from the past 48 hour(s)).  Radiological Exams on Admission: No results found.  Assessment/Plan 1. Bizarre behaviour In background of schizoaffective disorder, attention seeking behaviour ? Suicidal ideation:  Will request psych eval 1: 1 sitter Continue risperdone, add xanax PRN  2. COPD exacerbation: Improved, continue prednisone taper, nebs, avelox for 2 more days  3. Diastolic CHF: stable, continue PO lasix  4. HTN: stable   Dispo: pending Psych eval  Time Spent on Admission:  Paulina Muchmore [facility] Pager: [phone] 09/04/2011, 9:05 PM

## 2011-09-04 NOTE — ED Notes (Signed)
Ryan from group home [phone]

## 2011-09-04 NOTE — Progress Notes (Signed)
WRONG PATIENT INFO

## 2011-09-04 NOTE — Progress Notes (Signed)
[facility]             Progress Note   Subjective: Breathing better, cough with yellow phlegm, dyspnea last pm improved with lasix  Objective: Vital signs in last 24 hours: Temp:  [97.1 F (36.2 C)-97.9 F (36.6 C)] 97.6 F (36.4 C) (09/03 0700) Pulse Rate:  [68-92] 92  (09/03 0700) Resp:  [18-20] 18  (09/03 0700) BP: (133-154)/(77-85) 154/77 mmHg (09/03 0700) SpO2:  [90 %-100 %] 90 % (09/03 0700) Weight:  [68.8 kg (151 lb 10.8 oz)] 68.8 kg (151 lb 10.8 oz) (09/03 0700) Weight change: -1.644 kg (-3 lb 10 oz) Last BM Date: 09/03/11  Intake/Output from previous day: 09/02 0701 - 09/03 0700 In: 1080 [P.O.:1080] Out: 4275 [Urine:4275]     Physical Exam: General: Alert, awake, oriented x3, in no acute distress. HEENT: No bruits, no goiter. Heart: Regular rate and rhythm, without murmurs, rubs, gallops. Lungs: improved, scattered wheezes Abdomen: Soft, nontender, nondistended, positive bowel sounds. Extremities: No clubbing cyanosis or edema with positive pedal pulses. Neuro: Grossly intact, nonfocal.    Lab Results: Basic Metabolic Panel: No results found for this basename: NA:2,K:2,CL:2,CO2:2,GLUCOSE:2,BUN:2,CREATININE:2,CALCIUM:2,MG:2,PHOS:2 in the last 72 hours Liver Function Tests: No results found for this basename: AST:2,ALT:2,ALKPHOS:2,BILITOT:2,PROT:2,ALBUMIN:2 in the last 72 hours No results found for this basename: LIPASE:2,AMYLASE:2 in the last 72 hours No results found for this basename: AMMONIA:2 in the last 72 hours CBC:  Basename 09/02/11 0557  WBC 9.6  NEUTROABS --  HGB 12.0*  HCT 36.7*  MCV 82.3  PLT 234   Cardiac Enzymes:  Basename 09/01/11 2227  CKTOTAL --  CKMB --  CKMBINDEX --  TROPONINI <0.30   BNP: No results found for this basename: PROBNP:3 in the last 72 hours D-Dimer: No results found for this basename: DDIMER:2 in the last 72 hours CBG: No results found for this basename: GLUCAP:6 in the last 72  hours Hemoglobin A1C:  Basename 09/01/11 1118  HGBA1C 6.0*   Fasting Lipid Panel: No results found for this basename: CHOL,HDL,LDLCALC,TRIG,CHOLHDL,LDLDIRECT in the last 72 hours Thyroid Function Tests: No results found for this basename: TSH,T4TOTAL,FREET4,T3FREE,THYROIDAB in the last 72 hours Anemia Panel: No results found for this basename: VITAMINB12,FOLATE,FERRITIN,TIBC,IRON,RETICCTPCT in the last 72 hours Coagulation: No results found for this basename: LABPROT:2,INR:2 in the last 72 hours Urine Drug Screen: Drugs of Abuse     Component Value Date/Time   LABOPIA NONE DETECTED 06/13/2011 1000   COCAINSCRNUR NONE DETECTED 06/13/2011 1000   LABBENZ NONE DETECTED 06/13/2011 1000   AMPHETMU NONE DETECTED 06/13/2011 1000   THCU NONE DETECTED 06/13/2011 1000   LABBARB NONE DETECTED 06/13/2011 1000    Alcohol Level: No results found for this basename: ETH:2 in the last 72 hours Urinalysis: No results found for this basename: COLORURINE:2,APPERANCEUR:2,LABSPEC:2,PHURINE:2,GLUCOSEU:2,HGBUR:2,BILIRUBINUR:2,KETONESUR:2,PROTEINUR:2,UROBILINOGEN:2,NITRITE:2,LEUKOCYTESUR:2 in the last 72 hours  Recent Results (from the past 240 hour(s))  MRSA PCR SCREENING     Status: Abnormal   Collection Time   08/31/11 11:59 PM      Component Value Range Status Comment   MRSA by PCR POSITIVE (*) NEGATIVE Final   CULTURE, BLOOD (ROUTINE X 2)     Status: Normal (Preliminary result)   Collection Time   09/01/11  1:15 AM      Component Value Range Status Comment   Specimen Description BLOOD RIGHT ARM   Final    Special Requests BOTTLES DRAWN AEROBIC ONLY 10CC   Final    Culture  Setup Time 09/01/2011 11:23   Final    Culture  Final    Value:        BLOOD CULTURE RECEIVED NO GROWTH TO DATE CULTURE WILL BE HELD FOR 5 DAYS BEFORE ISSUING A FINAL NEGATIVE REPORT   Report Status PENDING   Incomplete   CULTURE, BLOOD (ROUTINE X 2)     Status: Normal (Preliminary result)   Collection Time   09/01/11  1:20 AM       Component Value Range Status Comment   Specimen Description BLOOD RIGHT HAND   Final    Special Requests BOTTLES DRAWN AEROBIC AND ANAEROBIC 10CC EACH   Final    Culture  Setup Time 09/01/2011 11:22   Final    Culture     Final    Value:        BLOOD CULTURE RECEIVED NO GROWTH TO DATE CULTURE WILL BE HELD FOR 5 DAYS BEFORE ISSUING A FINAL NEGATIVE REPORT   Report Status PENDING   Incomplete     Studies/Results: Ct Head Wo Contrast  09/02/2011  *RADIOLOGY REPORT*  Clinical Data: Altered level of consciousness.  Confusion.  CT HEAD WITHOUT CONTRAST  Technique:  Contiguous axial images were obtained from the base of the skull through the vertex without contrast.  Comparison: Head CT 03/13/2011 and 03/12/2011.  Findings: Study is again mildly motion noted.  There is no evidence of acute intracranial hemorrhage, mass lesion, brain edema or extra- axial fluid collection.  Mild chronic periventricular white matter disease is suspected, unchanged.  There is no evidence of cortical infarct or hydrocephalus.  Chronic mucosal thickening is again noted throughout the maxillary and ethmoid sinuses bilaterally, similar to the prior examination. No air-fluid levels are identified.  The mastoids and middle ears are clear.  The calvarium is intact.  IMPRESSION: Stable examination with mild chronic small vessel ischemic changes and chronic sinusitis.  No acute intracranial findings.   Original Report Authenticated By: Gerrianne Scale, M.D.     Medications: Scheduled Meds:    . amLODipine  10 mg Oral q morning - 10a  . bimatoprost  1 drop Both Eyes BID  . brimonidine  1 drop Both Eyes QPM  . Chlorhexidine Gluconate Cloth  6 each Topical Q0600  . divalproex  500 mg Oral Q8H  . enoxaparin  40 mg Subcutaneous Q24H  . fluticasone  2 puff Inhalation QPM  . furosemide  40 mg Oral Daily  . guaiFENesin  600 mg Oral BID  . levothyroxine  75 mcg Oral Q0600  . lithium carbonate  300 mg Oral TID WC  .  moxifloxacin  400 mg Oral Q2000  . mupirocin ointment  1 application Nasal BID  . predniSONE  40 mg Oral Q breakfast  . risperiDONE  1 mg Oral Daily  . risperiDONE  2 mg Oral Q2000  . sodium chloride  3 mL Intravenous Q12H  . Tamsulosin HCl  0.4 mg Oral QPC supper  . temazepam  30 mg Oral QPM  . traZODone  100 mg Oral QHS   Continuous Infusions:  PRN Meds:.sodium chloride, albuterol, albuterol-ipratropium, LORazepam, sodium chloride  Assessment/Plan:  .COPD with acute exacerbation -  Improving, cut down prednisone Albuterol/atrovent nebs Q6 and PRN CXr without pneumonia DC vanc/Zosyn Continue  avelox Wean O2  .Schizoaffective disorder - continue home medications   .Tobacco use -counseled  ? CHF exacerbation: - elevated BNP, cxr without fluid overload, responded to lasix yesterday, change to PO lasix and get ECHO done  Pleuritic chest pain: from COPD exacerbation, almost resolved  Hyperglycemia:  AIC  6.0  Prophylaxis: Lovenox, Protonix  ? Seizure vs pseudoseizure: stable on depakote   Dispo: home tomorrow   LOS: 4 days   [facility] Pager: [phone] 09/04/2011, 8:27 AM

## 2011-09-04 NOTE — Progress Notes (Signed)
Pts caregiver notified about pts DC order for today.  Caregiver stated he would be here to get pt and take pt back to group home.

## 2011-09-04 NOTE — Progress Notes (Signed)
Clinical Social Worker was informed by unit RN that patient is not cooperating with discharge. Patient's group home leader, Alycia Rossetti was present to pick patient up. Per Alycia Rossetti, the is common for the patient to state that he did not want to return back to group home. Alycia Rossetti called patient's legal guardian, Fredia Sorrow and CSW spoke with her. Per Fredia Sorrow, this is a pattern for the patient to become very agitated and state reason to not leave the hospital. Fredia Sorrow stated that the patient has been doing this for the 13 years she has been caring for him. CSW attempted multiple times to speak with patient regarding concerns of not wanting to return back, but patient was making mumbling sounds and was not easy to understand. Patient is refusing to be transported by [facility]. Plan is for patient to be transported via Ambulance, to [address] [place], [place]. Per previous note, APS has been notified of patient's concerns with returning to group home.   Rozetta Nunnery MSW, [employer] [phone]

## 2011-09-04 NOTE — Clinical Social Work Psychosocial (Signed)
     Clinical Social Work Department BRIEF PSYCHOSOCIAL ASSESSMENT 09/04/2011  Patient:  SCOUT, GUMBS     Account Number:  [id number]     Admit date:  08/31/2011  Clinical Social Worker:  Hulan Fray  Date/Time:  09/04/2011 11:49 AM  Referred by:  RN  Date Referred:  09/04/2011 Referred for  Other - See comment   Other Referral:   Admitted from a group home   Interview type:  Other - See comment Other interview type:   [facility]    PSYCHOSOCIAL DATA Living Status:  FACILITY Admitted from facility:   Level of care:  Group Home Primary support name:   Primary support relationship to patient:   Degree of support available:    CURRENT CONCERNS Current Concerns  None Noted   Other Concerns:    SOCIAL WORK ASSESSMENT / PLAN Clinical Social Worker received referral for patient being admitted from group home. CSW spoke with Alycia Rossetti at [facility] and reported that he will need FL2 and D/C summary to be sent in discharge packet. Alycia Rossetti stated that patient is to be transported via ambulance to [facility]. CSW will complete FL2 for Md's signature and facilitate discharge back to group home.   Assessment/plan status:  Other - See comment Other assessment/ plan:   Facilitate discharge back to group home today.   Information/referral to community resources:   Patient is from group home    PATIENTS/FAMILYS RESPONSE TO PLAN OF CARE: Patient is agreeable to return back to group home today and for transport to the [facility] day center.

## 2011-09-04 NOTE — Progress Notes (Signed)
Clinical Social Worker facilitated discharge by contacting [facility] regarding discharge today. Per Alycia Rossetti from group home, patient is to be transported via ambulance to [facility]. CSW completed discharge packet and EMS form. CSW is signing off.  Rozetta Nunnery MSW, [employer] [phone]

## 2011-09-04 NOTE — ED Notes (Signed)
pt reports feeling "nervous, uptight and frustrated"  He is difficult to understand due to slurred words, stuttering and transient thought. He reports SI and HI.

## 2011-09-05 ENCOUNTER — Encounter (HOSPITAL_COMMUNITY): Payer: Self-pay | Admitting: *Deleted

## 2011-09-05 DIAGNOSIS — F259 Schizoaffective disorder, unspecified: Secondary | ICD-10-CM

## 2011-09-05 DIAGNOSIS — F411 Generalized anxiety disorder: Secondary | ICD-10-CM

## 2011-09-05 DIAGNOSIS — F70 Mild intellectual disabilities: Secondary | ICD-10-CM

## 2011-09-05 DIAGNOSIS — R45851 Suicidal ideations: Secondary | ICD-10-CM

## 2011-09-05 DIAGNOSIS — I10 Essential (primary) hypertension: Secondary | ICD-10-CM

## 2011-09-05 LAB — CBC
HCT: 38.2 % — ABNORMAL LOW (ref 39.0–52.0)
MCH: 25.5 pg — ABNORMAL LOW (ref 26.0–34.0)
MCHC: 31.4 g/dL (ref 30.0–36.0)
MCV: 81.1 fL (ref 78.0–100.0)
Platelets: 257 10*3/uL (ref 150–400)
RDW: 14.4 % (ref 11.5–15.5)

## 2011-09-05 LAB — BASIC METABOLIC PANEL
BUN: 17 mg/dL (ref 6–23)
Calcium: 9.3 mg/dL (ref 8.4–10.5)
Chloride: 104 mEq/L (ref 96–112)
Creatinine, Ser: 0.88 mg/dL (ref 0.50–1.35)
GFR calc Af Amer: >90 mL/min (ref >90)

## 2011-09-05 MED ORDER — TAMSULOSIN HCL 0.4 MG PO CAPS
0.4000 mg | ORAL_CAPSULE | Freq: Every day | ORAL | Status: DC
  Administered 2011-09-05: 0.4 mg via ORAL
  Filled 2011-09-05 (×2): qty 1

## 2011-09-05 MED ORDER — FUROSEMIDE 40 MG PO TABS
40.0000 mg | ORAL_TABLET | Freq: Every day | ORAL | Status: DC
  Administered 2011-09-05 – 2011-09-06 (×2): 40 mg via ORAL
  Filled 2011-09-05 (×2): qty 1

## 2011-09-05 MED ORDER — AMLODIPINE BESYLATE 10 MG PO TABS
10.0000 mg | ORAL_TABLET | Freq: Every morning | ORAL | Status: DC
  Administered 2011-09-05 – 2011-09-06 (×2): 10 mg via ORAL
  Filled 2011-09-05 (×2): qty 1

## 2011-09-05 MED ORDER — ACETAMINOPHEN 325 MG PO TABS
650.0000 mg | ORAL_TABLET | Freq: Four times a day (QID) | ORAL | Status: DC | PRN
  Administered 2011-09-05: 650 mg via ORAL
  Filled 2011-09-05: qty 2

## 2011-09-05 MED ORDER — LITHIUM CARBONATE 300 MG PO CAPS
300.0000 mg | ORAL_CAPSULE | Freq: Three times a day (TID) | ORAL | Status: DC
  Administered 2011-09-05 – 2011-09-06 (×5): 300 mg via ORAL
  Filled 2011-09-05 (×7): qty 1

## 2011-09-05 MED ORDER — PREDNISONE 20 MG PO TABS
40.0000 mg | ORAL_TABLET | Freq: Every day | ORAL | Status: DC
  Administered 2011-09-05 – 2011-09-06 (×2): 40 mg via ORAL
  Filled 2011-09-05 (×3): qty 2

## 2011-09-05 MED ORDER — ENOXAPARIN SODIUM 30 MG/0.3ML ~~LOC~~ SOLN
30.0000 mg | SUBCUTANEOUS | Status: DC
  Administered 2011-09-05 – 2011-09-06 (×2): 30 mg via SUBCUTANEOUS
  Filled 2011-09-05 (×2): qty 0.3

## 2011-09-05 MED ORDER — IPRATROPIUM-ALBUTEROL 18-103 MCG/ACT IN AERO
2.0000 | INHALATION_SPRAY | Freq: Four times a day (QID) | RESPIRATORY_TRACT | Status: DC | PRN
  Administered 2011-09-05: 2 via RESPIRATORY_TRACT
  Filled 2011-09-05 (×2): qty 14.7

## 2011-09-05 MED ORDER — IBUPROFEN 600 MG PO TABS
600.0000 mg | ORAL_TABLET | Freq: Four times a day (QID) | ORAL | Status: DC | PRN
  Filled 2011-09-05: qty 1

## 2011-09-05 MED ORDER — TRAZODONE HCL 100 MG PO TABS
100.0000 mg | ORAL_TABLET | Freq: Every day | ORAL | Status: DC
  Administered 2011-09-05: 100 mg via ORAL
  Filled 2011-09-05 (×3): qty 1

## 2011-09-05 MED ORDER — RISPERIDONE 1 MG PO TABS
1.0000 mg | ORAL_TABLET | Freq: Every morning | ORAL | Status: DC
  Administered 2011-09-05 – 2011-09-06 (×2): 1 mg via ORAL
  Filled 2011-09-05 (×2): qty 1

## 2011-09-05 MED ORDER — FLUTICASONE PROPIONATE HFA 44 MCG/ACT IN AERO
2.0000 | INHALATION_SPRAY | Freq: Every evening | RESPIRATORY_TRACT | Status: DC
  Filled 2011-09-05: qty 10.6

## 2011-09-05 MED ORDER — RISPERIDONE 2 MG PO TABS
2.0000 mg | ORAL_TABLET | Freq: Every evening | ORAL | Status: DC
  Administered 2011-09-05: 2 mg via ORAL
  Filled 2011-09-05 (×2): qty 1

## 2011-09-05 MED ORDER — LEVOTHYROXINE SODIUM 75 MCG PO TABS
75.0000 ug | ORAL_TABLET | Freq: Every day | ORAL | Status: DC
  Administered 2011-09-05 – 2011-09-06 (×2): 75 ug via ORAL
  Filled 2011-09-05 (×3): qty 1

## 2011-09-05 MED ORDER — BRIMONIDINE TARTRATE 0.2 % OP SOLN
1.0000 [drp] | Freq: Every evening | OPHTHALMIC | Status: DC
  Administered 2011-09-05: 1 [drp] via OPHTHALMIC
  Filled 2011-09-05: qty 5

## 2011-09-05 MED ORDER — MOXIFLOXACIN HCL 400 MG PO TABS
400.0000 mg | ORAL_TABLET | Freq: Every day | ORAL | Status: DC
  Administered 2011-09-05: 400 mg via ORAL
  Filled 2011-09-05 (×3): qty 1

## 2011-09-05 MED ORDER — ALPRAZOLAM 0.5 MG PO TABS
0.5000 mg | ORAL_TABLET | Freq: Two times a day (BID) | ORAL | Status: DC | PRN
  Administered 2011-09-05: 0.5 mg via ORAL
  Filled 2011-09-05: qty 1

## 2011-09-05 MED ORDER — ALBUTEROL SULFATE HFA 108 (90 BASE) MCG/ACT IN AERS
2.0000 | INHALATION_SPRAY | Freq: Four times a day (QID) | RESPIRATORY_TRACT | Status: DC | PRN
  Filled 2011-09-05: qty 6.7

## 2011-09-05 MED ORDER — TEMAZEPAM 15 MG PO CAPS
30.0000 mg | ORAL_CAPSULE | Freq: Every evening | ORAL | Status: DC
  Administered 2011-09-05: 30 mg via ORAL
  Filled 2011-09-05: qty 2

## 2011-09-05 MED ORDER — BIMATOPROST 0.01 % OP SOLN
1.0000 [drp] | Freq: Two times a day (BID) | OPHTHALMIC | Status: DC
Start: 2011-09-05 — End: 2011-09-06
  Administered 2011-09-05 (×2): 1 [drp] via OPHTHALMIC
  Filled 2011-09-05: qty 2.5

## 2011-09-05 MED ORDER — DIVALPROEX SODIUM 500 MG PO DR TAB
500.0000 mg | DELAYED_RELEASE_TABLET | Freq: Three times a day (TID) | ORAL | Status: DC
  Administered 2011-09-05 – 2011-09-06 (×4): 500 mg via ORAL
  Filled 2011-09-05 (×8): qty 1

## 2011-09-05 NOTE — ED Provider Notes (Signed)
I saw and evaluated the patient, reviewed the resident's note and I agree with the findings and plan.  Medical screening examination performed.  The patient was promptly admitted back to the discharging service.  No life-threatening emergency present on arrival to the ER.  Lyanne Co, MD 09/05/11 0111

## 2011-09-05 NOTE — Progress Notes (Signed)
Nutrition Brief Note  Patient identified on the Malnutrition Screening Tool (MST) report for unsure of unplanned weight loss, generating a score of 2. Pt reports that he has had a good appetite.  Wt Readings from Last 5 Encounters:  09/05/11 154 lb 14.4 oz (70.262 kg)  09/04/11 151 lb 10.8 oz (68.8 kg)  04/11/11 155 lb 13.8 oz (70.7 kg)  04/11/11 155 lb 13.8 oz (70.7 kg)  03/13/11 153 lb 3.5 oz (69.5 kg)  Weight has been stable per weight hx.    Body mass index is 19.89 kg/(m^2). Pt meets criteria for WNL based on current BMI.   Current diet order is Low Sodium , patient is consuming approximately 100% of meals at this time. Labs and medications reviewed.   No nutrition interventions warranted at this time. If nutrition issues arise, please consult RD.   Clarene Duke RD, LDN Pager [phone] After Hours pager [phone]

## 2011-09-05 NOTE — Clinical Social Work Psychosocial (Signed)
     Clinical Social Work Department BRIEF PSYCHOSOCIAL ASSESSMENT 09/05/2011  Patient:  [name], [name]     Account Number:  [id number]     Admit date:  09/04/2011  Clinical Social Worker:  Jacelyn Grip  Date/Time:  09/05/2011 03:30 PM  Referred by:  Physician  Date Referred:  09/05/2011 Referred for  Other - See comment   Other Referral:   Admitted from Group Home   Interview type:  Other - See comment Other interview type:   Group Home Manager, [facility]    PSYCHOSOCIAL DATA Living Status:  FACILITY Admitted from facility:   Level of care:  Group Home Primary support name:  Ryan [facility]/group home manager and Fredia Sorrow Overman/guardian Primary support relationship to patient:  FAMILY Degree of support available:   adequate    CURRENT CONCERNS Current Concerns  Post-Acute Placement   Other Concerns:    SOCIAL WORK ASSESSMENT / PLAN CSW notified that pt from Group home. Psychiatry is following to address pt current behaviors. CSW spoke to psychiatry CSW who stated that Dr. Ferol Luz saw pt this morning and psychiatry CSW understanding is that plan is to adjust pt psychiatric medications. CSW spoke with RN who stated that was his understanding as well, but pt does not yet have any new orders. CSW contacted pt group home manager, Nonnie Done to discuss pt return when medically stable. Pt group home manager stated that plan is for pt to return to group home at discharge. CSW discussed information provided to this CSW about possible medications changes and group home manager asked to speak with RN as he is concerned about medications being changed because per his report, pt behaviors have been managed the most accurately recently and pt group home manager stated that he feels pt behaviors are due to pt not wanting to leave the hospital which per pt group home manager report, has been a pattern for pt during admissions to hospital. CSW provided phone to RN to speak to pt group home  manager. Pt group home manager requesting phone call from MD. CSW and RN notified MD and discussed pt group home managers concerns. CSW to continue to follow to assist with pt discharge needs back to pt group home.   Assessment/plan status:  Psychosocial Support/Ongoing Assessment of Needs Other assessment/ plan:   discharge planning   Information/referral to community resources:   Referral back to [facility]    PATIENTS/FAMILYS RESPONSE TO PLAN OF CARE: Pt group home manager agreeable to pt returning to group home, but is concerned about any adjustments being made to pt medications and eager to speak with MD and plans to come to hospital this evening.

## 2011-09-05 NOTE — Progress Notes (Signed)
Pt .was admitted from [facility] wheelchair,to [id number].He is oriented to self ,disoriented to time ,and is somewhat agitated,he stutters most of the time.Tried to orient to his room but he seemed not paying attention.He has no IV access,with a sitter at bedside.He refused V/S taken.3 nurses tried to take his V/S but he hide his arms on his back.Will continue to monitor pt.

## 2011-09-05 NOTE — Consult Note (Signed)
Patient Identification:  [name] Date of Evaluation:  09/05/2011 Reason for Consult: Refuses to be discharged from [facility]. Schizoaffective disorder, MMR  Referring Provider: Dr. Kendrick Fries  History of Present Illness:Pt was discharged from hospital and patient states he is suicidal. He was readmitted through the emergency department.  Past Psychiatric History: Patient has schizoaffective disorder. He is in a caregiving situation providing one to one care. He has a tendency to have outbursts of anger and reportedly may smear feces when he is upset.  Past Medical History:     Past Medical History  Diagnosis Date  . Seizures   . Psychiatric disorder   . Schizoaffective disorder   . Organic mood disorder   . Intermittent explosive disorder   . Generalized anxiety disorder   . Mild mental retardation   . COPD (chronic obstructive pulmonary disease)   . Peptic ulcer   . Thyroid disease   . Prostate hyperplasia with urinary obstruction   . Blind left eye   . Chronic respiratory failure   . Cancer   . Hypertension   . Diabetes mellitus   . Coronary artery disease   . Heart attack   . Diagnosis unknown     pt poor historian unable to give information  . Anxiety   . Diastolic CHF 09/04/2011       Past Surgical History  Procedure Date  . Hip pinning,cannulated 04/11/2011    Procedure: CANNULATED HIP PINNING;  Surgeon: Mable Paris, MD;  Location: [facility] OR;  Service: Orthopedics;  Laterality: Left;  Perc. Screw Fixation/Left  . Fracture surgery     cannulated hip pinning 04/11/11    Allergies:  Allergies  Allergen Reactions  . Peanuts (Peanut Oil) Other (See Comments)    Unknown reaction per caretaker  . Haldol (Haloperidol) Other (See Comments)    Unknown reaction per caretaker  . Other     Coffee-limit to 2 cups per caretaker; makes him violent  . Prolixin (Fluphenazine Hcl) Other (See Comments)    "muscle cramps"  . Thorazine (Chlorpromazine Hcl) Other (See  Comments)    "burns"  . Lactose Intolerance (Gi) Rash    Current Medications:  Prior to Admission medications   Medication Sig Start Date End Date Taking? Authorizing Provider  albuterol (PROVENTIL HFA;VENTOLIN HFA) 108 (90 BASE) MCG/ACT inhaler Inhale 2 puffs into the lungs every 6 (six) hours as needed for wheezing. 09/04/11 09/03/12 Yes Zannie Cove, MD  albuterol-ipratropium (COMBIVENT) 18-103 MCG/ACT inhaler Inhale 2 puffs into the lungs every 6 (six) hours as needed. Shortness of breath.   Yes Historical Provider, MD  amLODipine (NORVASC) 10 MG tablet Take 10 mg by mouth every morning.   Yes Historical Provider, MD  bimatoprost (LUMIGAN) 0.01 % SOLN Place 1 drop into both eyes 2 (two) times daily.   Yes Historical Provider, MD  brimonidine (ALPHAGAN P) 0.1 % SOLN Place 1 drop into both eyes every evening.   Yes Historical Provider, MD  divalproex (DEPAKOTE) 500 MG DR tablet Take 1 tablet (500 mg total) by mouth every 8 (eight) hours. 09/04/11 09/03/12 Yes Zannie Cove, MD  fluticasone (FLOVENT HFA) 44 MCG/ACT inhaler Inhale 2 puffs into the lungs every evening.  02/05/11 02/05/12 Yes Vassie Loll, MD  furosemide (LASIX) 40 MG tablet Take 1 tablet (40 mg total) by mouth daily. 09/04/11 09/03/12 Yes Zannie Cove, MD  levothyroxine (SYNTHROID, LEVOTHROID) 75 MCG tablet Take 75 mcg by mouth every morning.    Yes Historical Provider, MD  lithium carbonate 300  MG capsule Take 300 mg by mouth 3 (three) times daily with meals. Takes 0900, 1600 & 2000   Yes Historical Provider, MD  moxifloxacin (AVELOX) 400 MG tablet Take 1 tablet (400 mg total) by mouth daily at 8 pm. For 2 days 09/04/11 09/14/11 Yes Zannie Cove, MD  predniSONE (DELTASONE) 20 MG tablet Take 2 tablets (40 mg total) by mouth daily with breakfast. Take 40mg  for 2 days then  20mg  for 2 days then 10mg  for 2 days then STOP 09/04/11 09/14/11 Yes Zannie Cove, MD  risperiDONE (RISPERDAL) 1 MG tablet Take 1 mg by mouth daily.   Yes Historical  Provider, MD  risperiDONE (RISPERDAL) 2 MG tablet Take 2 mg by mouth daily at 8 pm.    Yes Historical Provider, MD  Tamsulosin HCl (FLOMAX) 0.4 MG CAPS Take 0.4 mg by mouth daily after supper.   Yes Historical Provider, MD  temazepam (RESTORIL) 30 MG capsule Take 30 mg by mouth every evening.   Yes Historical Provider, MD  traZODone (DESYREL) 100 MG tablet Take 100 mg by mouth at bedtime.     Yes Historical Provider, MD    Social History:    reports that he has been smoking Cigarettes.  He has a 12.5 pack-year smoking history. He does not have any smokeless tobacco history on file. He reports that he does not drink alcohol or use illicit drugs.   Family History:    Family History  Problem Relation Age of Onset  . Hypertension      Mental Status Examination/Evaluation: Objective:  Appearance: Casual  Psychomotor Activity:  Decreased and alternate with agitation  Eye Contact::  Minimal  Speech:  Pressured yelling insults stutters badly with perseveration   Volume:  Increased  Mood:  Angry, Dysphoric and Irritable  Affect:  Blunt, Labile and Demanding  Thought Process:  Coherent, Irrelevant and Ignore his interviewer  Orientation:  Other:  Demanding placement in a mental institution  Thought Content:  Paranoia  Suicidal Thoughts:  No  Homicidal Thoughts:  No  Judgement:  Impaired  Insight:  Lacking    DIAGNOSIS:   AXIS I   Schizoaffective Disorder,   AXIS II  MMR  AXIS III See medical notes.  AXIS IV other psychosocial or environmental problems and problems with primary support group  AXIS V 51-60 moderate symptoms   Assessment/Plan: Patient exhibits and obstinate attitude. Sitter in the room leaves. He sits very still and eventually answers questions with perseveration of demands. He insists on getting mental health treatment. He has a loud voice with pressured speech and stutters with repeated requests. That is the only time he has good eye contact. His entire body is rigid  and the only thing he does is shouts insults and demands. Otherwise, he is sitting rigidly staring straight ahead.      This patient is known to [facility] staff and has refused to return to his home care. He insists on mental health treatment but his MMR preclude his participation in interactive psychotherapy that is provided by psychiatric facilities. His unpredictable outbursts and degree of anger is highly suggestive that he could be aggressive and combative with staff. His mood and behavior may be better regulated with the change of medication.      Consider slow taper of benzodiazepine and the addition of increased mood regulation of Depakote with monitor of Depakote levels. The EKG records QTC prolongation and an antipsychotic may help regulate mood in unrealistic demands. Collaborative information from home staff may be  useful and treatment plan. At RECOMMENDATION:  1.  Discuss with hospital as choice of antipsychotic in the evening. 2.  Suggest taper of the Xanax to discontinue beginning with 0.5 mg twice daily and as Xanax is tapered, antipsychotic may be introduced to regulate mood and cognitive function as much as possible. 3.  Suggest Depakote 500 mg in a.m. and 1000 mg (2 tabs 500 mg) at night. 4.  Follow in one week with Depakote level. 5.  Agree with trazodone 100 mg at bedtime.  6.  Patient does not have capacity to participate in interactive therapy and psychiatric facility consider other placement options 7.  Will follow patient until option of antipsychotic may be discussed with M.D.  Giorgia Wahler J. Ferol Luz, MD Psychiatrist  09/05/2011 2:54 PM

## 2011-09-05 NOTE — Progress Notes (Signed)
Patient sitting in hallway refusing to return to room. Pt visibly agitated, rocking in chair. This Water engineer escorted pt back to room. Pt went into bathroom, slammed door, and threw items to the floor in the bathroom. MD notified, states will be in to see pt. 0.5 mg of xanax offered to pt, pt refuses to take medicines from this RN. Pt instructed not to throw items, stated he wanted coffee, and that he would "throw hot coffee in her [the NT/Sitter's] face," and that if he is told to stay in the room he would "jump on her." Advised pt that he had to treat nursing staff with respect. Pt agrees at this time. Will continue to monitor.

## 2011-09-06 DIAGNOSIS — J189 Pneumonia, unspecified organism: Secondary | ICD-10-CM

## 2011-09-06 DIAGNOSIS — I5030 Unspecified diastolic (congestive) heart failure: Secondary | ICD-10-CM

## 2011-09-06 DIAGNOSIS — I509 Heart failure, unspecified: Secondary | ICD-10-CM

## 2011-09-06 DIAGNOSIS — J441 Chronic obstructive pulmonary disease with (acute) exacerbation: Principal | ICD-10-CM

## 2011-09-06 MED ORDER — LORAZEPAM 1 MG PO TABS: 2.0000 mg | ORAL_TABLET | Freq: Once | ORAL | Status: DC

## 2011-09-06 MED ORDER — LORAZEPAM 2 MG/ML IJ SOLN
2.0000 mg | Freq: Once | INTRAMUSCULAR | Status: AC
  Administered 2011-09-06: 2 mg via INTRAMUSCULAR

## 2011-09-06 MED ORDER — LORAZEPAM 2 MG/ML IJ SOLN
INTRAMUSCULAR | Status: AC
  Filled 2011-09-06: qty 1

## 2011-09-06 MED ORDER — LORAZEPAM 1 MG PO TABS: 2.0000 mg | ORAL_TABLET | Freq: Once | ORAL | Status: AC

## 2011-09-06 MED ORDER — LORAZEPAM 2 MG/ML IJ SOLN: 2.0000 mg | Freq: Once | INTRAMUSCULAR | Status: DC

## 2011-09-06 NOTE — Progress Notes (Signed)
Progress Note:  Discussed with Dr. Radonna Ricker, Psych CSW M.D. is down for contact protection. Sitter is present.  Patient is greeted. [name] is sitting in a rigid posture head bent forward and does not move nor speak when addressed. There is no eye contact.  He is addressed several times, asked simple questions and he refuses to respond. DIAGNOSIS:  AXIS I  Schizoaffective Disorder, selective mutism   AXIS II  MMR   AXIS III  See medical notes.   AXIS IV  other psychosocial or environmental problems and problems with primary support group   AXIS V  51-60 moderate symptoms   This patient refuses to interact with M.D. Dr. Radonna Ricker is greeted at the door. He manage her of [name]'s group home has expressed to the Psych CSW that he does not want any of his medications changed because "he does wonderfully well while at the group home". It is still not understood why [name] insists on staying at the hospital prior to this request for consultation he had jumped off a gurney and screamed that he had suicidal thoughts. There has been no interaction that is productive in terms of assessing this claim. He has not stated yet since that day he refused to go to a group home. At this point he is medically stable there was no reason to treat him further and Dr. Radonna Ricker agrees. RECOMMENDATION: 1.. consider discontinue sitter 2.  Discuss with Dr. Radonna Ricker the benefit of inviting the group home manager to arrive and escort [name] to the transportation that will take him back to the group home. 3.  No further psychiatric needs are identified. M.D. Psychiatrist signs off [name] J. Ferol Luz, MD Psychiatrist . 09/06/2011 2:19 PM

## 2011-09-06 NOTE — Progress Notes (Signed)
Pt has continued to be aggitated and loud most of day but since Ativan 2mg  IM this am has had longer periods of quite but pt states he refuses to give into the medicine and still refuses to take medicine prescribed for him on a daily basis.  Has thrown several things in the room, cups, trash can  From outside hallway and table.  He has not hurt anyone nor himself but has been very lound and disrespectful to all including visitors walking up and down hallway, doorway to him room finally closed with male sitter with pt.  When ambulance arrived, the pt refused to get onto stretcher, said he was not leaving, and that he did not want to leave.  Explained understood but had to send him home with medically stable and his MD things it is time to do that.  Notified security when pt kept refusing to get onto stretcher.  He then got up on stretcher before they arrived and was taken by stretcher for ambulance ride back to group home.  Spoke with Alycia Rossetti his caregiver to give an update and to let him know he was on his way.

## 2011-09-06 NOTE — Discharge Summary (Signed)
Physician Discharge Summary  [name] YNW:[id number] DOB: 06-27-1955 DOA: 09/04/2011  PCP: Henri Medal, MD  Admit date: 09/04/2011 Discharge date: 09/06/2011  Discharge Diagnoses:  Active Problems:  COPD with acute exacerbation  HTN (hypertension)  Diastolic CHF   Discharge Condition: Stable  Disposition:  Follow-up Information    Follow up with Jackalyn Lombard I, MD in 2 weeks. (follow up)    Contact information:   [facility] [address] [place] [address] [phone]          Diet: Regular Wt Readings from Last 3 Encounters:  09/05/11 70.262 kg (154 lb 14.4 oz)  09/04/11 68.8 kg (151 lb 10.8 oz)  04/11/11 70.7 kg (155 lb 13.8 oz)    History of present illness:  56 year old male with schizoaffective disorder, diastolic CHF, COPD, tobacco abuse was discharged today from [facility] hospital after treatment for COPD exacerbation.  When his care worker came to pick him up he refused to go home and also declined EMS transporting him home, per his care worker he is always upset whenever he's being discharged from the hospital.  He choose to go back to the ER, to reportedly get some help for his psych problems  Apparently reportedly told someone that he may hurt himself if sent back, subsequently i decided to readmit him for inpatient psych eval in am   Hospital Course:  Suicidal ideation with bizarre behavior: Because of his background schizoaffective disorder and attention seeking behavior psychiatry's were consulted as he related some suicidal ideation psychiatry related that he is not suicidal and recommended to be discharged back to his facility. He also make some recommendations about increasing his Depakote which were discussed with a healthcare power of attorney and his group home caregiver Alycia Rossetti and they related that he has been stable on the current medication he has been. Will not give him on the medication and be further  evaluated at the facility. So he was discharged back to his facility and no changes were made.  Discharge Exam: Filed Vitals:   09/06/11 0001  BP: 157/97  Pulse: 61  Temp: 97.7 F (36.5 C)  Resp: 20   Filed Vitals:   09/05/11 0540 09/05/11 1050 09/05/11 1300 09/06/11 0001  BP:  156/88 147/81 157/97  Pulse:   72 61  Temp:   97.7 F (36.5 C) 97.7 F (36.5 C)  TempSrc:   Oral Oral  Resp:   18 20  Height:      Weight:      SpO2: 95%  94% 98%   General: The patient did not want to speak to the staff today were made. He did not allow any physical examination.  Discharge Instructions  Discharge Orders    Future Orders Please Complete By Expires   Diet - low sodium heart healthy      Increase activity slowly        Medication List  As of 09/06/2011 10:49 AM   STOP taking these medications         moxifloxacin 400 MG tablet      predniSONE 20 MG tablet         TAKE these medications         albuterol 108 (90 BASE) MCG/ACT inhaler   Commonly known as: PROVENTIL HFA;VENTOLIN HFA   Inhale 2 puffs into the lungs every 6 (six) hours as needed for wheezing.      albuterol-ipratropium 18-103 MCG/ACT inhaler   Commonly known as: COMBIVENT  Inhale 2 puffs into the lungs every 6 (six) hours as needed. Shortness of breath.      amLODipine 10 MG tablet   Commonly known as: NORVASC   Take 10 mg by mouth every morning.      bimatoprost 0.01 % Soln   Commonly known as: LUMIGAN   Place 1 drop into both eyes 2 (two) times daily.      brimonidine 0.1 % Soln   Commonly known as: ALPHAGAN P   Place 1 drop into both eyes every evening.      divalproex 500 MG DR tablet   Commonly known as: DEPAKOTE   Take 1 tablet (500 mg total) by mouth every 8 (eight) hours.      fluticasone 44 MCG/ACT inhaler   Commonly known as: FLOVENT HFA   Inhale 2 puffs into the lungs every evening.      furosemide 40 MG tablet   Commonly known as: LASIX   Take 1 tablet (40 mg total) by mouth  daily.      levothyroxine 75 MCG tablet   Commonly known as: SYNTHROID, LEVOTHROID   Take 75 mcg by mouth every morning.      lithium carbonate 300 MG capsule   Take 300 mg by mouth 3 (three) times daily with meals. Takes 0900, 1600 & 2000      risperiDONE 2 MG tablet   Commonly known as: RISPERDAL   Take 2 mg by mouth daily at 8 pm.      risperiDONE 1 MG tablet   Commonly known as: RISPERDAL   Take 1 mg by mouth daily.      Tamsulosin HCl 0.4 MG Caps   Commonly known as: FLOMAX   Take 0.4 mg by mouth daily after supper.      temazepam 30 MG capsule   Commonly known as: RESTORIL   Take 30 mg by mouth every evening.      traZODone 100 MG tablet   Commonly known as: DESYREL   Take 100 mg by mouth at bedtime.              The results of significant diagnostics from this hospitalization (including imaging, microbiology, ancillary and laboratory) are listed below for reference.    Significant Diagnostic Studies: Dg Chest 2 View  09/01/2011  *RADIOLOGY REPORT*  Clinical Data: COPD exacerbation.  Chest pain with breathing.  CHEST - 2 VIEW  Comparison: Two-view chest x-ray 06/12/2011, 05/30/2011, 02/15/2011.  Findings: Cardiac silhouette upper normal in size, unchanged. Hilar and mediastinal contours unremarkable.  Prominent bronchovascular markings diffusely and moderate central peribronchial thickening, unchanged.  Minimal scarring in the left upper lobe.  No new pulmonary parenchymal abnormalities.  No pleural effusions.  Degenerative changes throughout the thoracic spine.  Old healed fracture involving the posterior 5th right rib.  IMPRESSION: COPD (chronic bronchitis).  No acute cardiopulmonary disease.   Original Report Authenticated By: Arnell Sieving, M.D.    Ct Head Wo Contrast  09/02/2011  *RADIOLOGY REPORT*  Clinical Data: Altered level of consciousness.  Confusion.  CT HEAD WITHOUT CONTRAST  Technique:  Contiguous axial images were obtained from the base of the skull  through the vertex without contrast.  Comparison: Head CT 03/13/2011 and 03/12/2011.  Findings: Study is again mildly motion noted.  There is no evidence of acute intracranial hemorrhage, mass lesion, brain edema or extra- axial fluid collection.  Mild chronic periventricular white matter disease is suspected, unchanged.  There is no evidence of cortical infarct or hydrocephalus.  Chronic mucosal thickening is again noted throughout the maxillary and ethmoid sinuses bilaterally, similar to the prior examination. No air-fluid levels are identified.  The mastoids and middle ears are clear.  The calvarium is intact.  IMPRESSION: Stable examination with mild chronic small vessel ischemic changes and chronic sinusitis.  No acute intracranial findings.   Original Report Authenticated By: Gerrianne Scale, M.D.    Dg Chest Port 1 View  09/01/2011  *RADIOLOGY REPORT*  Clinical Data: Dyspnea  PORTABLE CHEST - 1 VIEW  Comparison: 09/01/2011 at 08:27 p.m.  Findings: Lungs are adequately inflated as the patient is slightly rotated to the left. There is no focal consolidation or effusion. Cardiomediastinal silhouette and remainder of the exam is unchanged.  IMPRESSION: No acute cardiopulmonary disease.   Original Report Authenticated By: Elba Barman, M.D.     Microbiology: Recent Results (from the past 240 hour(s))  MRSA PCR SCREENING     Status: Abnormal   Collection Time   08/31/11 11:59 PM      Component Value Range Status Comment   MRSA by PCR POSITIVE (*) NEGATIVE Final   CULTURE, BLOOD (ROUTINE X 2)     Status: Normal (Preliminary result)   Collection Time   09/01/11  1:15 AM      Component Value Range Status Comment   Specimen Description BLOOD RIGHT ARM   Final    Special Requests BOTTLES DRAWN AEROBIC ONLY 10CC   Final    Culture  Setup Time 09/01/2011 11:23   Final    Culture     Final    Value:        BLOOD CULTURE RECEIVED NO GROWTH TO DATE CULTURE WILL BE HELD FOR 5 DAYS BEFORE ISSUING A  FINAL NEGATIVE REPORT   Report Status PENDING   Incomplete   CULTURE, BLOOD (ROUTINE X 2)     Status: Normal (Preliminary result)   Collection Time   09/01/11  1:20 AM      Component Value Range Status Comment   Specimen Description BLOOD RIGHT HAND   Final    Special Requests BOTTLES DRAWN AEROBIC AND ANAEROBIC 10CC EACH   Final    Culture  Setup Time 09/01/2011 11:22   Final    Culture     Final    Value:        BLOOD CULTURE RECEIVED NO GROWTH TO DATE CULTURE WILL BE HELD FOR 5 DAYS BEFORE ISSUING A FINAL NEGATIVE REPORT   Report Status PENDING   Incomplete      Labs: Basic Metabolic Panel:  Lab 09/05/11 1610 09/04/11 2107 09/01/11 0125 08/31/11 1925  NA 141 142 141 141  K 3.5 3.7 -- --  CL 104 102 102 102  CO2 29 32 31 31  GLUCOSE 90 97 219* 126*  BUN 17 17 12 12   CREATININE 0.88 0.96 0.88 0.81  CALCIUM 9.3 9.9 9.5 9.2  MG -- -- -- --  PHOS -- -- -- --   Liver Function Tests:  Lab 09/04/11 2107 09/01/11 0125 08/31/11 1925  AST 9 14 17   ALT 11 15 15   ALKPHOS 76 80 80  BILITOT 0.3 0.2* 0.2*  PROT 7.8 7.3 7.3  ALBUMIN 3.6 3.3* 3.3*   No results found for this basename: LIPASE:5,AMYLASE:5 in the last 168 hours No results found for this basename: AMMONIA:5 in the last 168 hours CBC:  Lab 09/05/11 0700 09/04/11 2107 09/02/11 0557 09/01/11 0125 08/31/11 1925  WBC 8.3 8.9 9.6 5.1 6.4  NEUTROABS -- -- --  4.5 5.5  HGB 12.0* 12.8* 12.0* 11.1* 11.5*  HCT 38.2* 39.6 36.7* 36.6* 37.5*  MCV 81.1 81.0 82.3 83.6 84.8  PLT 257 248 234 191 196   Cardiac Enzymes:  Lab 09/01/11 2227  CKTOTAL --  CKMB --  CKMBINDEX --  TROPONINI <0.30   BNP: No components found with this basename: POCBNP:5 CBG: No results found for this basename: GLUCAP:5 in the last 168 hours  Time coordinating discharge: 30 minutes  Signed:  Marinda Elk  [facility] 09/06/2011, 10:49 AM

## 2011-09-06 NOTE — Progress Notes (Signed)
Clinical Social Worker assisting with facilitation of pt discharge needs. Patient has been seen by psychiatry and psychiatry determined that pt does not have acute psych needs and psych MD provided recommendations as pt group home leader, Nonnie Done and pt guardian are not interested in adjusting pts medications. Per their report, the current medication regime has been working for pt. Per MD, pt is medically stable for discharge today. Clinical Child psychotherapist discussed with pt group home manger and pt guardian and they are in agreement to pt returning to group home, however the barrier remains getting the pt safely back to the group home. Pt group home manager feels that safest route of transportation is ambulance transport at discharge and request that it be arranged at 5 pm. Clinical Social Worker discussed with MD and RN that it is common for pt to not cooperate with discharge. Per previous notes, APS was made aware during pt admission from 08/31/2011 to 09/04/2011 of pts concerns with returning to group home.   Ambulance transportation has been arranged for 5 pm for pt to be transported via ambulance to [address] [place], [place]. MD, RN, and charge nurse made aware. No further social work needs identified at this time. Clinical Social Worker signing off.     Jacklynn Lewis, MSW, LCSWA  Clinical Social Work [phone]

## 2011-09-06 NOTE — Progress Notes (Signed)
Discussed case with Psych MD and CSW Jacklynn Lewis who has made contact with patient's group home leader. Per Ryan: [employer], he is not interested in adjusting patient's medications, as patient has done terrific/best thus far at group home with current medication regime.  This information was relayed to Psych MD Bogard with regards to recommendations as to getting patient safely back to facility. Alycia Rossetti reports patient enjoys being in hospital and does not ever want to leave thus creates this pattern where he will act out (behaviorally and verbally) to remain in hospital with no real acute psych needs. (Thus why patient was readmitted after not wanting to get on ambulance a few days ago).  Psych MD aware and agreeable to follow up and make recommendations. These are only recommendations, thus can be used or not.  CSW on unit [place]) reports patient is welcomed to come back to group home, however the barriers remain getting him there safely via ambulance or facility arrangements.  Appears to be no other psych interventions at this time. Will again follow up with unit CSW for any other needs, otherwise will sign off with patient returning to group home.  Ashley Jacobs, MSW LCSW [phone]

## 2011-09-07 LAB — CULTURE, BLOOD (ROUTINE X 2)
Culture: NO GROWTH
Culture: NO GROWTH

## 2011-09-07 NOTE — Care Management Note (Signed)
    Page 1 of 1   09/07/2011     8:05:35 AM   CARE MANAGEMENT NOTE 09/07/2011  Patient:  [name], [name]   Account Number:  [id number]  Date Initiated:  09/07/2011  Documentation initiated by:  Letha Cape  Subjective/Objective Assessment:   dx acute copd  admit- from group home.     Action/Plan:   Anticipated DC Date:  09/06/2011   Anticipated DC Plan:  GROUP HOME  In-house referral  Clinical Social Worker      DC Planning Services  CM consult      Choice offered to / List presented to:             Status of service:  Completed, signed off Medicare Important Message given?   (If response is "NO", the following Medicare IM given date fields will be blank) Date Medicare IM given:   Date Additional Medicare IM given:    Discharge Disposition:  GROUP HOME  Per UR Regulation:  Reviewed for med. necessity/level of care/duration of stay  If discussed at Long Length of Stay Meetings, dates discussed:    Comments:  09/07/11 8:04 Letha Cape RN, BSN [phone] patient is from a Group Home, pt dc 'd back to Group Home, CSW following.

## 2011-11-17 ENCOUNTER — Emergency Department (HOSPITAL_COMMUNITY): Payer: Medicare Other

## 2011-11-17 ENCOUNTER — Emergency Department (HOSPITAL_COMMUNITY)
Admission: EM | Admit: 2011-11-17 | Discharge: 2011-11-18 | Disposition: A | Discharge status: Home / Self Care | Payer: Medicare Other | Attending: Emergency Medicine | Admitting: Emergency Medicine

## 2011-11-17 ENCOUNTER — Encounter (HOSPITAL_COMMUNITY): Payer: Self-pay | Admitting: *Deleted

## 2011-11-17 DIAGNOSIS — I2510 Atherosclerotic heart disease of native coronary artery without angina pectoris: Secondary | ICD-10-CM | POA: Insufficient documentation

## 2011-11-17 DIAGNOSIS — F445 Conversion disorder with seizures or convulsions: Secondary | ICD-10-CM

## 2011-11-17 DIAGNOSIS — I10 Essential (primary) hypertension: Secondary | ICD-10-CM | POA: Insufficient documentation

## 2011-11-17 DIAGNOSIS — R569 Unspecified convulsions: Secondary | ICD-10-CM | POA: Insufficient documentation

## 2011-11-17 DIAGNOSIS — E876 Hypokalemia: Secondary | ICD-10-CM | POA: Insufficient documentation

## 2011-11-17 DIAGNOSIS — R0789 Other chest pain: Secondary | ICD-10-CM

## 2011-11-17 DIAGNOSIS — F17200 Nicotine dependence, unspecified, uncomplicated: Secondary | ICD-10-CM | POA: Insufficient documentation

## 2011-11-17 LAB — COMPREHENSIVE METABOLIC PANEL
Albumin: 3.3 g/dL — ABNORMAL LOW (ref 3.5–5.2)
BUN: 10 mg/dL (ref 6–23)
Creatinine, Ser: 0.74 mg/dL (ref 0.50–1.35)
Total Protein: 6.7 g/dL (ref 6.0–8.3)

## 2011-11-17 LAB — D-DIMER, QUANTITATIVE: D-Dimer, Quant: 0.35 ug/mL-FEU (ref 0.00–0.48)

## 2011-11-17 LAB — CBC WITH DIFFERENTIAL/PLATELET
Basophils Relative: 1 % (ref 0–1)
Eosinophils Absolute: 0.6 10*3/uL (ref 0.0–0.7)
Eosinophils Relative: 8 % — ABNORMAL HIGH (ref 0–5)
HCT: 37.3 % — ABNORMAL LOW (ref 39.0–52.0)
Hemoglobin: 12 g/dL — ABNORMAL LOW (ref 13.0–17.0)
MCH: 26.1 pg (ref 26.0–34.0)
MCHC: 32.2 g/dL (ref 30.0–36.0)
Monocytes Absolute: 1 10*3/uL (ref 0.1–1.0)
Monocytes Relative: 14 % — ABNORMAL HIGH (ref 3–12)

## 2011-11-17 LAB — TROPONIN I: Troponin I: <0.3 ng/mL (ref <0.30)

## 2011-11-17 LAB — MAGNESIUM: Magnesium: 2.1 mg/dL (ref 1.5–2.5)

## 2011-11-17 MED ORDER — ADULT MULTIVITAMIN W/MINERALS CH: 1.0000 | ORAL_TABLET | Freq: Every day | ORAL | Status: DC

## 2011-11-17 MED ORDER — SODIUM CHLORIDE 0.9 % IV SOLN
1000.0000 mg | Freq: Once | INTRAVENOUS | Status: AC
  Administered 2011-11-17: 1000 mg via INTRAVENOUS
  Filled 2011-11-17: qty 10

## 2011-11-17 MED ORDER — VITAMIN B-1 100 MG PO TABS: 100.0000 mg | ORAL_TABLET | Freq: Every day | ORAL | Status: DC

## 2011-11-17 MED ORDER — SODIUM CHLORIDE 0.9 % IV BOLUS (SEPSIS)
1000.0000 mL | Freq: Once | INTRAVENOUS | Status: AC
  Administered 2011-11-17: 1000 mL via INTRAVENOUS

## 2011-11-17 MED ORDER — LORAZEPAM 1 MG PO TABS: 1.0000 mg | ORAL_TABLET | Freq: Four times a day (QID) | ORAL | Status: DC | PRN

## 2011-11-17 MED ORDER — ALBUTEROL SULFATE (5 MG/ML) 0.5% IN NEBU
5.0000 mg | INHALATION_SOLUTION | Freq: Once | RESPIRATORY_TRACT | Status: AC
  Administered 2011-11-17: 5 mg via RESPIRATORY_TRACT
  Filled 2011-11-17: qty 40

## 2011-11-17 MED ORDER — POTASSIUM CHLORIDE CRYS ER 20 MEQ PO TBCR: 40.0000 meq | EXTENDED_RELEASE_TABLET | Freq: Once | ORAL | Status: DC

## 2011-11-17 MED ORDER — LORAZEPAM 2 MG/ML IJ SOLN
1.0000 mg | Freq: Once | INTRAMUSCULAR | Status: AC
  Administered 2011-11-17: 1 mg via INTRAVENOUS
  Filled 2011-11-17: qty 1

## 2011-11-17 MED ORDER — LORAZEPAM 2 MG/ML IJ SOLN
1.0000 mg | Freq: Once | INTRAMUSCULAR | Status: AC
  Administered 2011-11-17: 1 mg via INTRAVENOUS

## 2011-11-17 MED ORDER — FOLIC ACID 1 MG PO TABS: 1.0000 mg | ORAL_TABLET | Freq: Every day | ORAL | Status: DC

## 2011-11-17 MED ORDER — IPRATROPIUM BROMIDE 0.02 % IN SOLN
0.5000 mg | Freq: Once | RESPIRATORY_TRACT | Status: AC
  Administered 2011-11-17: 0.5 mg via RESPIRATORY_TRACT
  Filled 2011-11-17: qty 2.5

## 2011-11-17 MED ORDER — LORAZEPAM 2 MG/ML IJ SOLN
1.0000 mg | Freq: Four times a day (QID) | INTRAMUSCULAR | Status: DC | PRN
Start: 2011-11-17 — End: 2011-11-18
  Filled 2011-11-17: qty 1

## 2011-11-17 MED ORDER — THIAMINE HCL 100 MG/ML IJ SOLN: 100.0000 mg | Freq: Every day | INTRAMUSCULAR | Status: DC

## 2011-11-17 MED ORDER — POTASSIUM CHLORIDE 10 MEQ/100ML IV SOLN
10.0000 meq | INTRAVENOUS | Status: DC
  Administered 2011-11-17 (×2): 10 meq via INTRAVENOUS
  Filled 2011-11-17: qty 200
  Filled 2011-11-17: qty 100

## 2011-11-17 MED ORDER — LORAZEPAM 2 MG/ML IJ SOLN: 2.0000 mg | Freq: Once | INTRAMUSCULAR | Status: DC

## 2011-11-17 MED ORDER — LORAZEPAM 1 MG PO TABS: 0.0000 mg | ORAL_TABLET | Freq: Four times a day (QID) | ORAL | Status: DC

## 2011-11-17 MED ORDER — LORAZEPAM 1 MG PO TABS: 0.0000 mg | ORAL_TABLET | Freq: Two times a day (BID) | ORAL | Status: DC

## 2011-11-17 NOTE — ED Provider Notes (Addendum)
History     CSN: [id number]  Arrival date & time 11/17/11  2004   First MD Initiated Contact with Patient 11/17/11 2006      Chief Complaint  Patient presents with  . Chest Pain    (Consider location/radiation/quality/duration/timing/severity/associated sxs/prior treatment) HPI Comments: Patient arrives by EMS with left-sided chest pain it radiates to his left arm has been constant all day. He is a poor historian and cannot provide his name her birth date. He is recognized by multiple staff members but he does not know his birthday. He admits to drinking 3 beers daily his last check was this morning. He states the pain hurts with breathing and he has been coughing and having some nausea and vomiting. His EKG shows ST elevations anteriorly consistent with LVH.  The history is provided by the patient and the EMS personnel. The history is limited by the condition of the patient.    Past Medical History  Diagnosis Date  . Hypertension   . Coronary artery disease     History reviewed. No pertinent past surgical history.  History reviewed. No pertinent family history.  History  Substance Use Topics  . Smoking status: Current Every Day Smoker  . Smokeless tobacco: Not on file  . Alcohol Use: 1.8 oz/week    3 Cans of beer per week     Comment: 3 beers per day per person      Review of Systems  Unable to perform ROS: Mental status change    Allergies  Review of patient's allergies indicates no known allergies.  Home Medications  No current outpatient prescriptions on file.  BP 141/89  Pulse 59  Temp 97.5 F (36.4 C) (Oral)  Resp 26  SpO2 96%  Physical Exam  Constitutional: He appears well-developed and well-nourished. No distress.       Tremulous  HENT:  Head: Normocephalic and atraumatic.  Mouth/Throat: Oropharynx is clear and moist. No oropharyngeal exudate.  Eyes: Conjunctivae normal are normal. Pupils are equal, round, and reactive to light.  Neck: Normal  range of motion. Neck supple.  Cardiovascular: Normal rate, regular rhythm and normal heart sounds.   Pulmonary/Chest: Effort normal and breath sounds normal. No respiratory distress.       Coarse breath sounds  Abdominal: Soft. There is no tenderness. There is no rebound and no guarding.  Musculoskeletal: Normal range of motion. He exhibits no edema and no tenderness.  Neurological: He is alert.       Moving all extremities equally  Skin: Skin is warm.    ED Course  Procedures (including critical care time)  Labs Reviewed  CBC WITH DIFFERENTIAL - Abnormal; Notable for the following:    Hemoglobin 12.0 (*)     HCT 37.3 (*)     Monocytes Relative 14 (*)     Eosinophils Relative 8 (*)     All other components within normal limits  COMPREHENSIVE METABOLIC PANEL - Abnormal; Notable for the following:    Potassium 2.7 (*)     Glucose, Bld 68 (*)     Albumin 3.3 (*)     Total Bilirubin 0.2 (*)     All other components within normal limits  TROPONIN I  D-DIMER, QUANTITATIVE  ETHANOL  MAGNESIUM  LITHIUM LEVEL  VALPROIC ACID LEVEL  TROPONIN I   Dg Chest 2 View  11/17/2011  *RADIOLOGY REPORT*  Clinical Data: Chest pain, hypertension.  CHEST - 2 VIEW  Comparison: None.  Findings: Peribronchial thickening and coarse interstitial markings.  Hilar prominence may be vascular.  Aortic tortuosity. Heart size upper normal to mildly enlarged.  Mild left lung base opacity.  No definite pleural effusion or pneumothorax.  No acute osseous finding.  Mild multilevel degenerative changes.  IMPRESSION: Peribronchial thickening/interstitial coarsening may reflect chronic or acute on chronic bronchitis.  Left lung base opacity; atelectasis versus infiltrate.  Heart size upper normal to mildly enlarged.  Hilar prominence may be vascular.  Recommend comparison with prior imaging (none available in our system) or a non emergent chest CT follow-up.   Original Report Authenticated By: Jearld Lesch, M.D.     Ct Head Wo Contrast  11/17/2011  *RADIOLOGY REPORT*  Clinical Data: Chest pain.  Possible TIA.  CT HEAD WITHOUT CONTRAST  Technique:  Contiguous axial images were obtained from the base of the skull through the vertex without contrast.  Comparison: None.  Findings: The patient had difficulty remaining motionless for the study.  Images are suboptimal.  Small or subtle lesions could be overlooked.  There is no evidence for acute infarction, intracranial hemorrhage, mass lesion, hydrocephalus, or extra-axial fluid.  Mild premature atrophy.  Chronic microvascular ischemic changes suspected. Calvarium grossly intact.  Moderate sinus disease affecting the ethmoid and maxillary regions, right greater than left.  No mastoid fluid.  IMPRESSION: Motion degraded exam.  Small or subtle lesions could be overlooked.  Mild premature atrophy and chronic microvascular ischemic change without definite acute findings.   Original Report Authenticated By: Davonna Belling, M.D.      No diagnosis found.    MDM  Patient arrives via EMS with 12 hours of constant left-sided chest pain rating on his left arm. He is a poor historian. He says is worse with deep breathing and coughing. He reports chronic alcohol abuse and states his last drink was this morning. Denies any cardiac history. Patient is known to the ED staff though unable to give an accurate birthdate the old records are unable to be obtained.  EKG discussed with on-call cardiology fellow who viewed it and agrees it is not a STEMI. While in the ED patient had a witnessed seizure like activity that lasted about 20 seconds. It is unclear whether patient has a seizure history though staff says he does. He is not taking medications. No postictal period noted.  Patient's caregiver Alycia Rossetti has arrived and confirms the patient's name is [name]. He has an alternative medical record number of [id number]. Patient's caregiver confirms he is at his baseline. Patient's  caregiver states patient "ran away" from him at [facility] which he does frequently and tries to come to the hospital.     He reports no history of seizures and he has actually faked seizures in the past. The patient does not drink alcohol per his caregiver. Patient seizure-like activity today did not demonstrate any tongue biting, urinary incontinence and no postictal period.   D/w Dr. Adela Glimpse who recognizing patient from previous admission and feels he is at his baseline. Old EKG was obtained and is unchanged from previous. Delta troponin negative. Potassium replacement ordered.  Ambulatory in the ED without desaturations.   Date: 11/17/2011  Rate: 78  Rhythm: normal sinus rhythm  QRS Axis: normal  Intervals: normal  ST/T Wave abnormalities: ST elevations anteriorly  Conduction Disutrbances:none  Narrative Interpretation: LVH  Old EKG Reviewed: none available      Glynn Octave, MD 11/18/11 [id number]  Glynn Octave, MD 11/18/11 [phone]

## 2011-11-17 NOTE — ED Notes (Signed)
Reports onset of chest pain today that he states radiates to left arm and tingling in left arm Describes CP as sharp and hurts with deep breaths and coughing. Also having n/v and increased sob. Reports cough. Denies fever. Upon ems arrival rated cp 10/10 and was treated with ASA 324mg  and NTG 1SLx1 dose with decrease in cp to 5/10. #20g right piv started and given Zofram 4mg  iv. 12 lead done with no acute changes. Drinks "3 beers" per day and last drink today.

## 2011-11-18 ENCOUNTER — Other Ambulatory Visit: Payer: Self-pay

## 2011-11-18 LAB — LITHIUM LEVEL: Lithium Lvl: <0.25 mEq/L — ABNORMAL LOW (ref 0.80–1.40)

## 2011-11-18 LAB — TROPONIN I: Troponin I: <0.3 ng/mL (ref <0.30)

## 2011-11-18 NOTE — ED Notes (Signed)
Pt desiring to leave and pulled out lines. EDP discharged. Po KCL, last run of KCL,  MVI, thiamine and folic acid not given due to pt discharge.

## 2011-11-18 NOTE — ED Notes (Signed)
Pt caregiver came out of room to state pt was pulling out IVs.  When this RN went into room, pt had pulled out IVs and stated "I'm gonna call my doctor".  Staff escorted pt out after walking around department.  EDP has d/c pt.  Patient refused to sign e-signature.  Pt left with caregiver.

## 2011-11-18 NOTE — ED Notes (Signed)
Called to pt's room by lab staff for pt having a seizure - not witnessed. EDP at bedside pt talking and answering his questions. ? Seizure activity. Pt alert and answering questions, no incontinence. Orders placed by EDP

## 2011-11-19 ENCOUNTER — Encounter (HOSPITAL_COMMUNITY): Payer: Self-pay | Admitting: *Deleted

## 2012-03-01 ENCOUNTER — Emergency Department (HOSPITAL_COMMUNITY): Payer: Medicare Other

## 2012-03-01 ENCOUNTER — Inpatient Hospital Stay (HOSPITAL_COMMUNITY)
Admission: EM | Admit: 2012-03-01 | Discharge: 2012-03-07 | DRG: 291 | Disposition: A | Discharge status: Home / Self Care | Payer: Medicare Other | Attending: Internal Medicine | Admitting: Internal Medicine

## 2012-03-01 DIAGNOSIS — J189 Pneumonia, unspecified organism: Secondary | ICD-10-CM

## 2012-03-01 DIAGNOSIS — R0902 Hypoxemia: Secondary | ICD-10-CM | POA: Diagnosis present

## 2012-03-01 DIAGNOSIS — R079 Chest pain, unspecified: Secondary | ICD-10-CM

## 2012-03-01 DIAGNOSIS — F259 Schizoaffective disorder, unspecified: Secondary | ICD-10-CM

## 2012-03-01 DIAGNOSIS — Z720 Tobacco use: Secondary | ICD-10-CM

## 2012-03-01 DIAGNOSIS — E039 Hypothyroidism, unspecified: Secondary | ICD-10-CM

## 2012-03-01 DIAGNOSIS — Z781 Physical restraint status: Secondary | ICD-10-CM | POA: Diagnosis not present

## 2012-03-01 DIAGNOSIS — I5033 Acute on chronic diastolic (congestive) heart failure: Principal | ICD-10-CM

## 2012-03-01 DIAGNOSIS — J441 Chronic obstructive pulmonary disease with (acute) exacerbation: Secondary | ICD-10-CM

## 2012-03-01 DIAGNOSIS — F17200 Nicotine dependence, unspecified, uncomplicated: Secondary | ICD-10-CM

## 2012-03-01 DIAGNOSIS — F70 Mild intellectual disabilities: Secondary | ICD-10-CM

## 2012-03-01 DIAGNOSIS — I509 Heart failure, unspecified: Secondary | ICD-10-CM | POA: Diagnosis present

## 2012-03-01 DIAGNOSIS — E119 Type 2 diabetes mellitus without complications: Secondary | ICD-10-CM

## 2012-03-01 DIAGNOSIS — R071 Chest pain on breathing: Secondary | ICD-10-CM

## 2012-03-01 DIAGNOSIS — I5030 Unspecified diastolic (congestive) heart failure: Secondary | ICD-10-CM

## 2012-03-01 DIAGNOSIS — R569 Unspecified convulsions: Secondary | ICD-10-CM

## 2012-03-01 DIAGNOSIS — N400 Enlarged prostate without lower urinary tract symptoms: Secondary | ICD-10-CM

## 2012-03-01 DIAGNOSIS — I10 Essential (primary) hypertension: Secondary | ICD-10-CM

## 2012-03-01 DIAGNOSIS — J9600 Acute respiratory failure, unspecified whether with hypoxia or hypercapnia: Secondary | ICD-10-CM | POA: Diagnosis present

## 2012-03-01 HISTORY — DX: Chest pain, unspecified: R07.9

## 2012-03-01 HISTORY — DX: Other chronic pain: G89.29

## 2012-03-01 LAB — BASIC METABOLIC PANEL
Chloride: 103 mEq/L (ref 96–112)
Creatinine, Ser: 1.11 mg/dL (ref 0.50–1.35)
GFR calc Af Amer: 83 mL/min — ABNORMAL LOW (ref >90)
GFR calc non Af Amer: 71 mL/min — ABNORMAL LOW (ref >90)
Potassium: 4.1 mEq/L (ref 3.5–5.1)

## 2012-03-01 LAB — CBC
MCHC: 29.9 g/dL — ABNORMAL LOW (ref 30.0–36.0)
MCV: 85.6 fL (ref 78.0–100.0)
Platelets: 180 10*3/uL (ref 150–400)
RDW: 15.1 % (ref 11.5–15.5)
WBC: 5.8 10*3/uL (ref 4.0–10.5)

## 2012-03-01 LAB — PRO B NATRIURETIC PEPTIDE: Pro B Natriuretic peptide (BNP): 5660 pg/mL — ABNORMAL HIGH (ref 0–125)

## 2012-03-01 MED ORDER — ACETAMINOPHEN 650 MG RE SUPP: 650.0000 mg | Freq: Four times a day (QID) | RECTAL | Status: DC | PRN

## 2012-03-01 MED ORDER — IPRATROPIUM BROMIDE 0.02 % IN SOLN
0.5000 mg | Freq: Once | RESPIRATORY_TRACT | Status: AC
  Administered 2012-03-01: 0.5 mg via RESPIRATORY_TRACT
  Filled 2012-03-01: qty 2.5

## 2012-03-01 MED ORDER — ONDANSETRON HCL 4 MG/2ML IJ SOLN: 4.0000 mg | Freq: Four times a day (QID) | INTRAMUSCULAR | Status: DC | PRN

## 2012-03-01 MED ORDER — SODIUM CHLORIDE 0.9 % IJ SOLN
3.0000 mL | Freq: Two times a day (BID) | INTRAMUSCULAR | Status: DC
  Administered 2012-03-02: 3 mL via INTRAVENOUS

## 2012-03-01 MED ORDER — SODIUM CHLORIDE 0.9 % IV SOLN: 250.0000 mL | INTRAVENOUS | Status: DC | PRN

## 2012-03-01 MED ORDER — ONDANSETRON HCL 4 MG PO TABS: 4.0000 mg | ORAL_TABLET | Freq: Four times a day (QID) | ORAL | Status: DC | PRN

## 2012-03-01 MED ORDER — ZOLPIDEM TARTRATE 5 MG PO TABS
5.0000 mg | ORAL_TABLET | Freq: Every evening | ORAL | Status: DC | PRN
  Administered 2012-03-02 – 2012-03-05 (×3): 5 mg via ORAL
  Filled 2012-03-01 (×4): qty 1

## 2012-03-01 MED ORDER — ALBUTEROL SULFATE (5 MG/ML) 0.5% IN NEBU
5.0000 mg | INHALATION_SOLUTION | Freq: Once | RESPIRATORY_TRACT | Status: AC
  Administered 2012-03-01: 5 mg via RESPIRATORY_TRACT
  Filled 2012-03-01: qty 1

## 2012-03-01 MED ORDER — SODIUM CHLORIDE 0.9 % IJ SOLN
3.0000 mL | INTRAMUSCULAR | Status: DC | PRN
  Administered 2012-03-01: 3 mL via INTRAVENOUS

## 2012-03-01 MED ORDER — METHYLPREDNISOLONE SODIUM SUCC 125 MG IJ SOLR
125.0000 mg | Freq: Once | INTRAMUSCULAR | Status: DC
  Filled 2012-03-01: qty 2

## 2012-03-01 MED ORDER — ACETAMINOPHEN 325 MG PO TABS: 650.0000 mg | ORAL_TABLET | ORAL | Status: DC | PRN

## 2012-03-01 MED ORDER — FUROSEMIDE 10 MG/ML IJ SOLN
40.0000 mg | Freq: Once | INTRAMUSCULAR | Status: AC
  Administered 2012-03-01: 40 mg via INTRAVENOUS
  Filled 2012-03-01: qty 4

## 2012-03-01 MED ORDER — SODIUM CHLORIDE 0.9 % IJ SOLN: 3.0000 mL | INTRAMUSCULAR | Status: DC | PRN

## 2012-03-01 MED ORDER — HYDROMORPHONE HCL PF 1 MG/ML IJ SOLN
0.5000 mg | INTRAMUSCULAR | Status: DC | PRN
  Administered 2012-03-02 (×4): 1 mg via INTRAVENOUS
  Filled 2012-03-01 (×4): qty 1

## 2012-03-01 MED ORDER — LISINOPRIL 10 MG PO TABS
10.0000 mg | ORAL_TABLET | Freq: Every day | ORAL | Status: DC
  Administered 2012-03-01 – 2012-03-05 (×5): 10 mg via ORAL
  Filled 2012-03-01 (×5): qty 1

## 2012-03-01 MED ORDER — SODIUM CHLORIDE 0.9 % IJ SOLN
3.0000 mL | Freq: Two times a day (BID) | INTRAMUSCULAR | Status: DC
  Administered 2012-03-01: 3 mL via INTRAVENOUS

## 2012-03-01 MED ORDER — POTASSIUM CHLORIDE CRYS ER 20 MEQ PO TBCR
20.0000 meq | EXTENDED_RELEASE_TABLET | Freq: Two times a day (BID) | ORAL | Status: AC
  Administered 2012-03-01 – 2012-03-03 (×4): 20 meq via ORAL
  Filled 2012-03-01 (×4): qty 1

## 2012-03-01 MED ORDER — ASPIRIN EC 325 MG PO TBEC
325.0000 mg | DELAYED_RELEASE_TABLET | Freq: Every day | ORAL | Status: DC
  Administered 2012-03-01 – 2012-03-07 (×6): 325 mg via ORAL
  Filled 2012-03-01 (×7): qty 1

## 2012-03-01 MED ORDER — METHYLPREDNISOLONE SODIUM SUCC 125 MG IJ SOLR
80.0000 mg | Freq: Two times a day (BID) | INTRAMUSCULAR | Status: AC
  Administered 2012-03-02 (×2): 80 mg via INTRAVENOUS
  Filled 2012-03-01 (×2): qty 1.28

## 2012-03-01 MED ORDER — ALBUTEROL SULFATE (5 MG/ML) 0.5% IN NEBU
2.5000 mg | INHALATION_SOLUTION | Freq: Four times a day (QID) | RESPIRATORY_TRACT | Status: DC
  Administered 2012-03-02 (×3): 2.5 mg via RESPIRATORY_TRACT
  Filled 2012-03-01 (×3): qty 0.5

## 2012-03-01 MED ORDER — ALUM & MAG HYDROXIDE-SIMETH 200-200-20 MG/5ML PO SUSP: 30.0000 mL | Freq: Four times a day (QID) | ORAL | Status: DC | PRN

## 2012-03-01 MED ORDER — SODIUM CHLORIDE 0.9 % IJ SOLN
3.0000 mL | Freq: Two times a day (BID) | INTRAMUSCULAR | Status: DC
  Administered 2012-03-02 – 2012-03-05 (×3): 3 mL via INTRAVENOUS

## 2012-03-01 MED ORDER — ACETAMINOPHEN 325 MG PO TABS: 650.0000 mg | ORAL_TABLET | Freq: Four times a day (QID) | ORAL | Status: DC | PRN

## 2012-03-01 MED ORDER — ALBUTEROL SULFATE (5 MG/ML) 0.5% IN NEBU
2.5000 mg | INHALATION_SOLUTION | RESPIRATORY_TRACT | Status: DC | PRN
Start: 2012-03-01 — End: 2012-03-07

## 2012-03-01 MED ORDER — OXYCODONE HCL 5 MG PO TABS
5.0000 mg | ORAL_TABLET | ORAL | Status: DC | PRN
  Administered 2012-03-02 – 2012-03-05 (×4): 5 mg via ORAL
  Filled 2012-03-01 (×5): qty 1

## 2012-03-01 MED ORDER — METHYLPREDNISOLONE SODIUM SUCC 125 MG IJ SOLR
125.0000 mg | Freq: Once | INTRAMUSCULAR | Status: AC
  Administered 2012-03-01: 125 mg via INTRAVENOUS
  Filled 2012-03-01: qty 2

## 2012-03-01 MED ORDER — LEVOFLOXACIN IN D5W 750 MG/150ML IV SOLN
750.0000 mg | INTRAVENOUS | Status: AC
  Administered 2012-03-01 – 2012-03-05 (×5): 750 mg via INTRAVENOUS
  Filled 2012-03-01 (×5): qty 150

## 2012-03-01 MED ORDER — FUROSEMIDE 10 MG/ML IJ SOLN
40.0000 mg | Freq: Two times a day (BID) | INTRAMUSCULAR | Status: AC
  Administered 2012-03-02 – 2012-03-03 (×3): 40 mg via INTRAVENOUS
  Filled 2012-03-01 (×4): qty 4

## 2012-03-01 MED ORDER — INSULIN ASPART 100 UNIT/ML ~~LOC~~ SOLN
0.0000 [IU] | SUBCUTANEOUS | Status: DC
  Administered 2012-03-02: 2 [IU] via SUBCUTANEOUS
  Administered 2012-03-02 (×3): 1 [IU] via SUBCUTANEOUS
  Administered 2012-03-03: 2 [IU] via SUBCUTANEOUS
  Administered 2012-03-04 – 2012-03-06 (×4): 1 [IU] via SUBCUTANEOUS

## 2012-03-01 MED ORDER — ENOXAPARIN SODIUM 40 MG/0.4ML ~~LOC~~ SOLN
40.0000 mg | SUBCUTANEOUS | Status: DC
  Administered 2012-03-01 – 2012-03-04 (×3): 40 mg via SUBCUTANEOUS
  Filled 2012-03-01 (×5): qty 0.4

## 2012-03-01 MED ORDER — CARVEDILOL 3.125 MG PO TABS
3.1250 mg | ORAL_TABLET | Freq: Two times a day (BID) | ORAL | Status: DC
  Administered 2012-03-01 – 2012-03-02 (×2): 3.125 mg via ORAL
  Filled 2012-03-01 (×5): qty 1

## 2012-03-01 NOTE — ED Notes (Signed)
He was reportedly found by group ome worker in a bathtub being quite weak and having soiled himself and was notably short of breath.  He continues to be short of breath with audible wheezes present.  He is in no distress, and is able to answer all questions regarding his situation appropriately.

## 2012-03-01 NOTE — ED Provider Notes (Signed)
History     CSN: [id number]  Arrival date & time 03/01/12  4098   First MD Initiated Contact with Patient 03/01/12 1841      Chief Complaint  Patient presents with  . Shortness of Breath    (Consider location/radiation/quality/duration/timing/severity/associated sxs/prior treatment) HPI UNABLE TO OBTAIN ROS DUE TO MENTAL RETARDATION Pt presents from group home after staff noted him to be short of breath and wheezing.  He has hx of COPD.  Staff member states he saw his doctor 1 week ago and was treated with nebs- which improved his breathing. Yesterday and today he has been more short of breath and winded with talking.  No fever/chills.  Per nursing triage note he was found in a bathtub- but staff states that his tub was clogged and this does not seem to be related to his current illness  Past Medical History  Diagnosis Date  . Seizures   . Psychiatric disorder   . Schizoaffective disorder   . Organic mood disorder   . Intermittent explosive disorder   . Generalized anxiety disorder   . Mild mental retardation   . COPD (chronic obstructive pulmonary disease)   . Peptic ulcer   . Thyroid disease   . Prostate hyperplasia with urinary obstruction   . Blind left eye   . Chronic respiratory failure   . Cancer   . Diabetes mellitus   . Heart attack   . Diagnosis unknown     pt poor historian unable to give information  . Anxiety   . Diastolic CHF 09/04/2011  . Hypertension   . Coronary artery disease     Past Surgical History  Procedure Laterality Date  . Hip pinning,cannulated  04/11/2011    Procedure: CANNULATED HIP PINNING;  Surgeon: Mable Paris, MD;  Location: [facility] OR;  Service: Orthopedics;  Laterality: Left;  Perc. Screw Fixation/Left  . Fracture surgery      cannulated hip pinning 04/11/11    Family History  Problem Relation Age of Onset  . Hypertension      History  Substance Use Topics  . Smoking status: Current Every Day Smoker -- 0.25 packs/day  for 50 years    Types: Cigarettes  . Smokeless tobacco: Not on file  . Alcohol Use: 1.8 oz/week    3 Cans of beer per week     Comment: 3 beers per day per person      Review of Systems ROS reviewed and all otherwise negative except for mentioned in HPI  Allergies  Peanuts; Haldol; Other; Prolixin; Thorazine; and Lactose intolerance (gi)  Home Medications   No current outpatient prescriptions on file.  BP 136/90  Pulse 88  Temp(Src) 99.1 F (37.3 C) (Oral)  Resp 18  Ht 5\' 10"  (1.778 m)  Wt 168 lb 10.4 oz (76.5 kg)  BMI 24.2 kg/m2  SpO2 88% Vitals reviewed Physical Exam Physical Examination: General appearance - alert, well appearing, and in no distress Mental status - alert, oriented to person, unable to answer other orientation questions Eyes - no conjunctival injection, no scleral icterus Mouth - mucous membranes moist, pharynx normal without lesions Chest - no rales or rhonchi, symmetric air entry, bilateral expiratory wheezing, no increased respiratory effort, speaking in full sentences Heart - normal rate, regular rhythm, normal S1, S2, no murmurs, rubs, clicks or gallops Abdomen - soft, nontender, nondistended, no masses or organomegaly Extremities - peripheral pulses normal, no pedal edema, no clubbing or cyanosis Skin - normal coloration and turgor, no rashes  ED Course  Procedures (including critical care time)  10:35 PM d/w [facility] for admission, he will go to telemetry bed, team 8.     Date: 03/1//2014  Rate: 79  Rhythm: normal sinus rhythm  QRS Axis: normal  Intervals: normal  ST/T Wave abnormalities: nonspecific T wave changes  Conduction Disutrbances:none  Narrative Interpretation:   Old EKG Reviewed: nonspecific t wave changes are new compared to prior ekg of 9/13   Labs Reviewed  MRSA PCR SCREENING - Abnormal; Notable for the following:    MRSA by PCR POSITIVE (*)    All other components within normal limits  CBC - Abnormal; Notable for the  following:    Hemoglobin 11.9 (*)    MCH 25.6 (*)    MCHC 29.9 (*)    All other components within normal limits  BASIC METABOLIC PANEL - Abnormal; Notable for the following:    GFR calc non Af Amer 71 (*)    GFR calc Af Amer 83 (*)    All other components within normal limits  PRO B NATRIURETIC PEPTIDE - Abnormal; Notable for the following:    Pro B Natriuretic peptide (BNP) 5660.0 (*)    All other components within normal limits  BASIC METABOLIC PANEL - Abnormal; Notable for the following:    CO2 33 (*)    Glucose, Bld 125 (*)    GFR calc non Af Amer 75 (*)    GFR calc Af Amer 87 (*)    All other components within normal limits  CBC - Abnormal; Notable for the following:    WBC 3.2 (*)    Hemoglobin 12.1 (*)    MCH 25.5 (*)    All other components within normal limits  GLUCOSE, CAPILLARY - Abnormal; Notable for the following:    Glucose-Capillary 185 (*)    All other components within normal limits  GLUCOSE, CAPILLARY - Abnormal; Notable for the following:    Glucose-Capillary 125 (*)    All other components within normal limits  GLUCOSE, CAPILLARY - Abnormal; Notable for the following:    Glucose-Capillary 119 (*)    All other components within normal limits  GLUCOSE, CAPILLARY - Abnormal; Notable for the following:    Glucose-Capillary 117 (*)    All other components within normal limits  GLUCOSE, CAPILLARY - Abnormal; Notable for the following:    Glucose-Capillary 142 (*)    All other components within normal limits  TROPONIN I  TSH  HEMOGLOBIN A1C  TROPONIN I  TROPONIN I  URINALYSIS, ROUTINE W REFLEX MICROSCOPIC  TROPONIN I  BASIC METABOLIC PANEL   Dg Chest 2 View  03/01/2012  *RADIOLOGY REPORT*  Clinical Data: Productive cough.  Smoker.  CHEST - 2 VIEW  Comparison: 09/01/2011.  Findings: Cardiomegaly.  Pulmonary vascular congestion/pulmonary edema superimposed on chronic changes with small bilateral pleural effusions.  Fullness of the hilar region more so than on  the prior examination. This may represent pulmonary vasculature although underlying mass or adenopathy not excluded.  Mild basilar atelectasis.  IMPRESSION:  Cardiomegaly.  Pulmonary vascular congestion/pulmonary edema superimposed on chronic changes with small bilateral pleural effusions.  Fullness of the hilar region more so than on the prior examination. This may represent pulmonary vasculature although underlying mass or adenopathy not excluded.   Original Report Authenticated By: Lacy Duverney, M.D.      1. Hypoxemia   2. Acute on chronic diastolic congestive heart failure   3. COPD with acute exacerbation   4. Chest pain   5.  DM w/o complication type II   6. HTN (hypertension)   7. Hypothyroidism   8. Mental retardation, mild (I.Q. 50-70)   9. Schizoaffective disorder   10. Tobacco use       MDM  Pt presenting with c/o shortness of breath.  Pt with diffuse wheezing on exam, hx of COPD so started on nebs, steroids, abx, CXR revealed pulmonary edema- pt does have hx of CHF as well on po lasix.  BNP elevated above baseline, troponin negative.  Pt given IV lasix as well.  Pt admitted to [facility] for further management.         Ethelda Chick, MD 03/02/12 [phone]

## 2012-03-01 NOTE — ED Notes (Signed)
[id number]> Expected date:<BR> Expected time:<BR> Means of arrival:<BR> Comments:<BR> Hold for [employer]

## 2012-03-01 NOTE — H&P (Signed)
[facility] History and Physical  [name]:[id number] DOB: 11-Oct-1955 DOA: 03/01/2012  Referring physician: EDP PCP: Henri Medal, MD  Specialists:   Chief Complaint: SOB and Wheezing  HPI: [name] is a 57 y.o. male resident of an area Group home with a history of COPD and Diastolic CHF  who was brought to the ED due to worsening SOB, and wheezing.  He had been found by his caretaker in the bathroom soiled and in the tub SOB and wheezing.   He had had URI symptoms 1 week ago and was seen by his PCP.   He was evaluated in the ED and found to have hypoxemia, with O2 saturations of 80% and was placed on supplemental Oxygen.  He was found on Chest X-Ray to have Pulmonary Edema and small bilateral pleural effusions.   His BNP was 5660.   He was started on treatment for a CHF exacerbation and a COPD Flare.  He was referred for medical admission.      Review of Systems:     UNABLE TO  OBTAIN FROM THE PATIENT.      Past Medical History  Diagnosis Date  . Seizures   . Psychiatric disorder   . Schizoaffective disorder   . Organic mood disorder   . Intermittent explosive disorder   . Generalized anxiety disorder   . Mild mental retardation   . COPD (chronic obstructive pulmonary disease)   . Peptic ulcer   . Thyroid disease   . Prostate hyperplasia with urinary obstruction   . Blind left eye   . Chronic respiratory failure   . Cancer   . Diabetes mellitus   . Heart attack   . Diagnosis unknown     pt poor historian unable to give information  . Anxiety   . Diastolic CHF 09/04/2011  . Hypertension   . Coronary artery disease    Past Surgical History  Procedure Laterality Date  . Hip pinning,cannulated  04/11/2011    Procedure: CANNULATED HIP PINNING;  Surgeon: Mable Paris, MD;  Location: [facility] OR;  Service: Orthopedics;  Laterality: Left;  Perc. Screw Fixation/Left  . Fracture surgery      cannulated hip pinning 04/11/11    Medications:  HOME  MEDS: Prior to Admission medications   Medication Sig Start Date End Date Taking? Authorizing Provider  albuterol (PROVENTIL HFA;VENTOLIN HFA) 108 (90 BASE) MCG/ACT inhaler Inhale 2 puffs into the lungs every 6 (six) hours as needed for wheezing. 09/04/11 09/03/12 Yes Zannie Cove, MD  amLODipine (NORVASC) 10 MG tablet Take 10 mg by mouth every morning.   Yes Historical Provider, MD  bimatoprost (LUMIGAN) 0.01 % SOLN Place 1 drop into both eyes at bedtime.    Yes Historical Provider, MD  Brinzolamide-Brimonidine [facility]) 1-0.2 % SUSP Place 1 drop into the left eye 3 (three) times daily.   Yes Historical Provider, MD  levothyroxine (SYNTHROID, LEVOTHROID) 75 MCG tablet Take 75 mcg by mouth every morning.    Yes Historical Provider, MD  lithium carbonate 300 MG capsule Take 300 mg by mouth 3 (three) times daily with meals. Takes 0900, 1600 & 2000   Yes Historical Provider, MD  risperiDONE (RISPERDAL) 1 MG tablet Take 1 mg by mouth every morning.    Yes Historical Provider, MD  risperiDONE (RISPERDAL) 2 MG tablet Take 2 mg by mouth daily at 8 pm.    Yes Historical Provider, MD  Tamsulosin HCl (FLOMAX) 0.4 MG CAPS Take 0.4 mg by mouth daily  after supper.   Yes Historical Provider, MD  temazepam (RESTORIL) 30 MG capsule Take 30 mg by mouth every evening.   Yes Historical Provider, MD  traZODone (DESYREL) 100 MG tablet Take 100 mg by mouth at bedtime.     Yes Historical Provider, MD    Allergies:  Allergies  Allergen Reactions  . Peanuts (Peanut Oil) Other (See Comments)    Unknown reaction per caretaker  . Haldol (Haloperidol) Other (See Comments)    Unknown reaction per caretaker  . Other     Coffee-limit to 2 cups per caretaker; makes him violent  . Prolixin (Fluphenazine Hcl) Other (See Comments)    "muscle cramps"  . Thorazine (Chlorpromazine Hcl) Other (See Comments)    "burns"  . Lactose Intolerance (Gi) Rash    Social History:   reports that he has been smoking Cigarettes.  He has  a 12.5 pack-year smoking history. He does not have any smokeless tobacco history on file. He reports that he drinks about 1.8 ounces of alcohol per week. He reports that he does not use illicit drugs.  Family History: Family History  Problem Relation Age of Onset  . Hypertension       Physical Exam:  GEN:  Pleasant 57 year old thin well developed Caucasian Male examined  and in no acute distress; cooperative with exam Filed Vitals:   03/01/12 1832 03/01/12 2033 03/01/12 2044  BP: 137/82    Pulse: 88    Temp: 98.5 F (36.9 C)    TempSrc: Oral    Resp: 18    SpO2: 85% 100% 98%   Blood pressure 137/82, pulse 88, temperature 98.5 F (36.9 C), temperature source Oral, resp. rate 18, SpO2 98.00%. PSYCH: He is alert and oriented x4; does not appear anxious does not appear depressed; affect is normal HEENT: Normocephalic and Atraumatic, Mucous membranes pink; PERRLA; EOM intact; Fundi:  Benign;  No scleral icterus, Nares: Patent, Oropharynx: Clear, Fair Dentition, Neck:  FROM, no cervical lymphadenopathy nor thyromegaly or carotid bruit; no JVD; Breasts:: Not examined CHEST WALL: No tenderness CHEST: Normal respiration, Decreased breath Sound Bilaterally , diffuse expiratory wheezes HEART: Regular rate and rhythm; no murmurs rubs or gallops BACK: No kyphosis or scoliosis; no CVA tenderness ABDOMEN: Positive Bowel Sounds, soft non-tender; no masses, no organomegaly.    Rectal Exam: Not done EXTREMITIES: No cyanosis, clubbing or edema; no ulcerations. Genitalia: not examined PULSES: 2+ and symmetric SKIN: Normal hydration no rash or ulceration CNS: Cranial nerves 2-12 grossly intact no focal neurologic deficit   Labs & Imaging Results for orders placed during the hospital encounter of 03/01/12 (from the past 48 hour(s))  CBC     Status: Abnormal   Collection Time    03/01/12  7:40 PM      Result Value Range   WBC 5.8  4.0 - 10.5 K/uL   RBC 4.65  4.22 - 5.81 MIL/uL   Hemoglobin  11.9 (*) 13.0 - 17.0 g/dL   HCT 60.4  54.0 - 98.1 %   MCV 85.6  78.0 - 100.0 fL   MCH 25.6 (*) 26.0 - 34.0 pg   MCHC 29.9 (*) 30.0 - 36.0 g/dL   RDW 19.1  47.8 - 29.5 %   Platelets 180  150 - 400 K/uL  BASIC METABOLIC PANEL     Status: Abnormal   Collection Time    03/01/12  7:40 PM      Result Value Range   Sodium 141  135 - 145 mEq/L  Potassium 4.1  3.5 - 5.1 mEq/L   Chloride 103  96 - 112 mEq/L   CO2 32  19 - 32 mEq/L   Glucose, Bld 96  70 - 99 mg/dL   BUN 16  6 - 23 mg/dL   Creatinine, Ser 1.61  0.50 - 1.35 mg/dL   Calcium 8.6  8.4 - 09.6 mg/dL   GFR calc non Af Amer 71 (*) >90 mL/min   GFR calc Af Amer 83 (*) >90 mL/min   Comment:            The eGFR has been calculated     using the CKD EPI equation.     This calculation has not been     validated in all clinical     situations.     eGFR's persistently     <90 mL/min signify     possible Chronic Kidney Disease.  PRO B NATRIURETIC PEPTIDE     Status: Abnormal   Collection Time    03/01/12  7:40 PM      Result Value Range   Pro B Natriuretic peptide (BNP) 5660.0 (*) 0 - 125 pg/mL  TROPONIN I     Status: None   Collection Time    03/01/12  8:56 PM      Result Value Range   Troponin I <0.30  <0.30 ng/mL   Comment:            Due to the release kinetics of cTnI,     a negative result within the first hours     of the onset of symptoms does not rule out     myocardial infarction with certainty.     If myocardial infarction is still suspected,     repeat the test at appropriate intervals.    Cardiac Enzymes:  Recent Labs Lab 03/01/12 2056  TROPONINI <0.30    BNP (last 3 results)  Recent Labs  03/10/11 1253 08/31/11 1928 03/01/12 1940  PROBNP 52.5 827.6* 5660.0*   CBG: No results found for this basename: GLUCAP,  in the last 168 hours  Radiological Exams on Admission: Dg Chest 2 View  03/01/2012  *RADIOLOGY REPORT*  Clinical Data: Productive cough.  Smoker.  CHEST - 2 VIEW  Comparison: 09/01/2011.   Findings: Cardiomegaly.  Pulmonary vascular congestion/pulmonary edema superimposed on chronic changes with small bilateral pleural effusions.  Fullness of the hilar region more so than on the prior examination. This may represent pulmonary vasculature although underlying mass or adenopathy not excluded.  Mild basilar atelectasis.  IMPRESSION:  Cardiomegaly.  Pulmonary vascular congestion/pulmonary edema superimposed on chronic changes with small bilateral pleural effusions.  Fullness of the hilar region more so than on the prior examination. This may represent pulmonary vasculature although underlying mass or adenopathy not excluded.   Original Report Authenticated By: Lacy Duverney, M.D.     EKG:   Assessment/Plan Principal Problem:   Hypoxemia Active Problems:   Acute on chronic diastolic congestive heart failure   COPD with acute exacerbation   Chest pain   Hypothyroidism   Schizoaffective disorder   Mental retardation, mild (I.Q. 50-70)   HTN (hypertension)   DM w/o complication type II    1.   Hypoxemia-   Multifactorial, due to CHF exacerbation , and COPD exacerbation.   Patient placed on CHF Protocol and 40 mg  IV Lasix to be given q 12 hours X 4  To diurese.  He had a recent 2 D ECHO  In 09/2011.  Also started on an IV Steroid taper, and given Albuterol Nebs.   Atrovent nebs not given due to his PEANUT ALLERGY.     2.   Chest Pain-   Due to cough and wheezing, however check troponins.    3.   Hypothyroid- on synthroid, check TSH.    4.   Schizoaffective disorder and Mental retardation - unchanged and stable.    5.   HTN-  On Amlodipine,  On hold for now due to lasix , and addition of lisinopril and carvedilol on the CHF protocol.     6.   DM2-   SSi coverage PRN.     7.   Other DVT prophylaxis    Code Status:    FULL CODE Family Communication:    No Family present Disposition Plan:    Return to Group Home on Discharge   Time spent: 82 Minutes   Ron Parker [facility] Pager [phone]  If 7PM-7AM, please contact night-coverage [email or web address] Password [facility] 03/01/2012, 10:53 PM

## 2012-03-02 ENCOUNTER — Encounter (HOSPITAL_COMMUNITY): Payer: Self-pay | Admitting: *Deleted

## 2012-03-02 LAB — BASIC METABOLIC PANEL
CO2: 33 mEq/L — ABNORMAL HIGH (ref 19–32)
Calcium: 8.5 mg/dL (ref 8.4–10.5)
Creatinine, Ser: 1.07 mg/dL (ref 0.50–1.35)

## 2012-03-02 LAB — CBC
MCV: 84.4 fL (ref 78.0–100.0)
Platelets: 157 10*3/uL (ref 150–400)
RDW: 14.9 % (ref 11.5–15.5)
WBC: 3.2 10*3/uL — ABNORMAL LOW (ref 4.0–10.5)

## 2012-03-02 LAB — URINALYSIS, ROUTINE W REFLEX MICROSCOPIC
Bilirubin Urine: NEGATIVE
Hgb urine dipstick: NEGATIVE
Ketones, ur: NEGATIVE mg/dL
Specific Gravity, Urine: 1.009 (ref 1.005–1.030)
Urobilinogen, UA: 0.2 mg/dL (ref 0.0–1.0)
pH: 6.5 (ref 5.0–8.0)

## 2012-03-02 LAB — GLUCOSE, CAPILLARY
Glucose-Capillary: 185 mg/dL — ABNORMAL HIGH (ref 70–99)
Glucose-Capillary: 119 mg/dL — ABNORMAL HIGH (ref 70–99)
Glucose-Capillary: 142 mg/dL — ABNORMAL HIGH (ref 70–99)
Glucose-Capillary: 131 mg/dL — ABNORMAL HIGH (ref 70–99)

## 2012-03-02 LAB — TROPONIN I
Troponin I: <0.3 ng/mL (ref <0.30)
Troponin I: <0.3 ng/mL (ref <0.30)
Troponin I: <0.3 ng/mL (ref <0.30)

## 2012-03-02 LAB — MRSA PCR SCREENING: MRSA by PCR: POSITIVE — AB

## 2012-03-02 LAB — TSH: TSH: 1.689 u[IU]/mL (ref 0.350–4.500)

## 2012-03-02 MED ORDER — AMLODIPINE BESYLATE 2.5 MG PO TABS
2.5000 mg | ORAL_TABLET | Freq: Every day | ORAL | Status: DC
  Administered 2012-03-03 – 2012-03-06 (×4): 2.5 mg via ORAL
  Filled 2012-03-02 (×4): qty 1

## 2012-03-02 MED ORDER — MUPIROCIN 2 % EX OINT
1.0000 "application " | TOPICAL_OINTMENT | Freq: Two times a day (BID) | CUTANEOUS | Status: DC
  Administered 2012-03-02 – 2012-03-06 (×9): 1 via NASAL
  Filled 2012-03-02: qty 22

## 2012-03-02 MED ORDER — PREDNISONE 20 MG PO TABS
40.0000 mg | ORAL_TABLET | Freq: Every day | ORAL | Status: DC
  Administered 2012-03-03 – 2012-03-04 (×2): 40 mg via ORAL
  Filled 2012-03-02 (×4): qty 2

## 2012-03-02 MED ORDER — ATROPINE SULFATE 0.1 MG/ML IJ SOLN
INTRAMUSCULAR | Status: AC
  Filled 2012-03-02: qty 10

## 2012-03-02 MED ORDER — ALBUTEROL SULFATE (5 MG/ML) 0.5% IN NEBU
2.5000 mg | INHALATION_SOLUTION | RESPIRATORY_TRACT | Status: DC
  Administered 2012-03-02 – 2012-03-03 (×7): 2.5 mg via RESPIRATORY_TRACT
  Filled 2012-03-02 (×7): qty 0.5

## 2012-03-02 MED ORDER — CHLORHEXIDINE GLUCONATE CLOTH 2 % EX PADS
6.0000 | MEDICATED_PAD | Freq: Every day | CUTANEOUS | Status: AC
  Administered 2012-03-03 – 2012-03-07 (×5): 6 via TOPICAL

## 2012-03-02 NOTE — Progress Notes (Signed)
Clinical Social Work Department BRIEF PSYCHOSOCIAL ASSESSMENT 03/02/2012  Patient:  [name], [name]     Account Number:  [id number]     Admit date:  03/01/2012  Clinical Social Worker:  Doroteo Glassman  Date/Time:  03/02/2012 12:19 PM  Referred by:  RN  Date Referred:  03/02/2012 Referred for  Psychosocial assessment   Other Referral:   Interview type:  Patient Other interview type:   Pt's guardian, Mrs. Overmann.  Pt's caregiver, Mr. Eliseo Gum.    PSYCHOSOCIAL DATA Living Status:  OTHER Admitted from facility:   Level of care:   Primary support name:  Mrs. Overmann. Primary support relationship to patient:  FAMILY Degree of support available:   strong    CURRENT CONCERNS Current Concerns  Post-Acute Placement   Other Concerns:    SOCIAL WORK ASSESSMENT / PLAN Met with Pt to discuss d/c plans.    Pt was alert and oriented, however he was unable to recall the name of his group home.  Pt was able to participate in the conversation but would often respond to internal stimuli.  Pt identified Burna Forts as his legal guardian and gave permission for Pt to contact her.    CSW contacted Mrs. Overmann in Pt's room.  Mrs. Loel Lofty stated that she is Pt's cousin, however he considers her to be his aunt.  Mrs. Loel Lofty stated that Pt was living in a group home until about a year ago.  She decided last January (2013) that Pt would function well in a single-family home.  Mrs. Loel Lofty stated that Pt is cared for 24/7 by 1 person and that this works well for Pt.  Mrs. Overmann stated that Pt's caregiver is [employer].  She stated that she believes that Mr. Eliseo Gum will require an FL2 upon Pt's d/c.    Mrs. Overmann stated that Mr. Eliseo Gum will provide transportation home for Pt upon d/c and requested that she, too, be notified.    Pt wanted to speak to Mrs. Overmann.  CSW provided Pt with CSW's phone for a brief conversation.    CSW then thanked Mrs. Overmann for her time.    CSW  asked Pt about living with Mr. Eliseo Gum.  Pt stated that he liked Mr. Eliseo Gum and he gave CSW permission to contact him.    CSW thanked Pt for his time.    Contacted Mr. Eliseo Gum.    Mr. Eliseo Gum stated that he will need an FL2 and that it's sufficient to send it with Pt upon d/c; he doesn't need it at the outset.    Mr. Eliseo Gum confirmed that he will pick up Pt at d/c.    CSW thanked Mr. Eliseo Gum for his time.   Assessment/plan status:  Psychosocial Support/Ongoing Assessment of Needs Other assessment/ plan:   Information/referral to community resources:   n/a    PATIENT'S/FAMILY'S RESPONSE TO PLAN OF CARE: Pt thanked CSW for time and assistance.   CSW to continue to follow.  Providence Crosby, LCSWA Clinical Social Work [phone]

## 2012-03-02 NOTE — Progress Notes (Addendum)
Central Tele notified that pt had 5.23 pause. Assessed pt-aroused from sleep, asymptomatic. MD notified. Will continue to monitor pt.  03/03/2012 Pt continues to rest well and has had no further episodes with HR pauses. Cardiologist has been notified of cardiac episode. Recommended Atropine and crash cart be placed at bedside in case it's needed. Patient to remain on this unit presently. I will continue to monitor him.

## 2012-03-02 NOTE — Progress Notes (Signed)
03-02-12  0540 - pt has taken off his belt restraints, attempting to get oob, pulling off medical equipment, - placed in bil wrist restraints, comfort measures cont to be provided, education given to pt, pt remains confused and unable to understand.  I have talked to CN again and have requested a sitter for safety.

## 2012-03-02 NOTE — Progress Notes (Signed)
UR completed 

## 2012-03-02 NOTE — Progress Notes (Signed)
03-02-12 Pt PO intake of fluids is very large.  Within the first hour of being on the floor pt drank 1300cc's fluid.  Eduction provided re: CHF and to decrease amount of fluids.  Pt unable to understand.  Water pitcher removed from bedside and we are providing fluids on a cup to cup basis.  Doctor do you want to have a fluid restriction order?

## 2012-03-02 NOTE — Progress Notes (Signed)
03-02-12 0430 cbg is 125 and pt given 1 unit of novolg but unable to chart it - given in abd

## 2012-03-02 NOTE — Progress Notes (Signed)
03-02-12  Pt admitted to floor at 0030, I have asked for a sitter for safety b/c pt continues to be very restless, impulsive, pulling on tubes, getting oob s assist.   Joe RN house coverage states there is not one available for sitting c pt.   Much education given to pt [id number] and not getting oob s help etc, pt is pleasant but remains confused, all comfort measures given repeatedly, no family able to stay c pt.  Have educated pt re: restraints rationale, release criteria, 4 SR up, alarm remains on, and belt restraint placed.  Lenny Pastel and CN notified and order received

## 2012-03-02 NOTE — Progress Notes (Signed)
[facility] progress note. Chief complaint. Cardiac pause. History of present illness. This 57 year old male in hospital with congestive heart failure, exacerbation COPD, chest pain likely due to cough and wheezing with negative troponin x2. Patient has been monitored on telemetry and this evening was noted to have a 5.23 second cardiac pause. Nursing was notified and immediately came to the patient's bedside. They found the patient alert and sitting up in bed with no distress and no specific complaints. His vital signs were stable. I ordered a 12-lead EKG and this resulted showing minimal voltage criteria for LVH, T wave abnormality consider anterior ischemia. EKG was reviewed with prior EKG and I see no significant changes from baseline and no indication of acute ischemia. I came to the bedside to see the patient. I find him alert and appropriate. He is complaining of some lateral left chest pain without nausea, diaphoresis, or radiation. I reviewed the patient's medications and see nothing that should be affecting the patient's rhythm. Vital signs. Temperature 98.7, pulse 60, respiration 18, blood pressure 132/77. O2 sats 94% on low-flow nasal cannula oxygen. Cardiac. Rate and rhythm regular. Lungs. Mild expiratory wheezing. Some rhonchi bilaterally and some scattered crackles in the bases. Abdomen. Soft with positive bowel sounds. No pain. Extremity. Trace pedal edema. No calf pain and negative Homans. Impression/plan. Problem #1. 5.23 second cardiac pause. I discussed the case with on-call cardiology for [facility]. We reviewed the medications including Norvasc which cardiology felt was unlikely to be the cause. Indicated patient may require a pacemaker if not otherwise medically contraindicated. The patient will continue to be monitored closely with telemetry and nursing will notify me of any further events. Per cardiology's recommendation we have placed an external  pacemaker and atropine at the bedside. [facility] will see the patient in consult later this a.m.

## 2012-03-02 NOTE — Progress Notes (Addendum)
[facility] PROGRESS NOTE  [name] ZOX:[id number] DOB: 1955-11-05 DOA: 03/01/2012 PCP: Henri Medal, MD  Assessment/Plan  Acute hypoxic respiratory failure likely due to acute diastolic CHF exacerbation and acute COPD exacerbation.  Likely more diastolic heart failure given rales and elevated BNP compared to COPD so will taper steroids more quickly.   Continues to have chest tightness and SOB.    For acute diastolic CHF: -  Daily weights and strict I/O -  Neg 1.7 liters with lasix 40mg  IV BID yesterday -  Daily electrolytes while diuresing -  Continue telemetry -  When potassium < 4, will add standing potassium  For acute COPD exac: -  Solumedrol today and then prednisone 40mg  tomorrow for quick taper -  Peanut allergy so no ipratropium -  Continue albuterol prn - Levofloxacin day 2  Chest Pain, likely due to cough and wheezing -  Troponins neg x 2   HTN- control BP in setting of diastolic heart failure - Restart norvasc tomorrow and D/C beta blocker as not a core measure for diastolic heart failure and patient has COPD exacerbation -  continue ACEI 2/2 diabetes.  -  Continue lasix  Hypothyroid, stable, TSH 1.7 - cont synthroid  Schizoaffective disorder and Mental retardation - unchanged and stable.   DM2- FS well controlled -  continue SSi  Dysuria:  Rule out UTI -  UA neg  Diet:  diabetic Access:  PIV IVF:  none Proph:  lovenox  Code Status: full code Family Communication: spoke with patient alone Disposition Plan: pending improvement in respiratory status.   Consultants:  none  Procedures:  none  Antibiotics:  Levofloxacin 3/1 >>   HPI/Subjective:  Denies fevers, chills.  + chest pressure and shortness of breath.   Denies nausea, vomiting, diarrhea.  Denies constipation.    Objective: Filed Vitals:   03/02/12 0500 03/02/12 0837 03/02/12 0856 03/02/12 1446  BP: 123/97  118/80   Pulse: 55  69   Temp: 97.9 F (36.6 C)      TempSrc: Oral     Resp: 18     Height:      Weight: 76.5 kg (168 lb 10.4 oz)     SpO2: 95% 91%  92%    Intake/Output Summary (Last 24 hours) at 03/02/12 1515 Last data filed at 03/02/12 1100  Gross per 24 hour  Intake   1900 ml  Output   3835 ml  Net  -1935 ml   Filed Weights   03/01/12 2259 03/02/12 0045 03/02/12 0500  Weight: 77 kg (169 lb 12.1 oz) 77 kg (169 lb 12.1 oz) 76.5 kg (168 lb 10.4 oz)    Exam:   General:  Obese male, mild respiratory distres with prolonged expiratory phase  HEENT:  NCAT, MMM  Cardiovascular:  RRR, nl S1, S2 no mrg, 2+ pulses, warm extremities  Respiratory:  Rales to the apices, and full expiratory wheeze, no rhonchi.    Abdomen:  NABS, soft, NT/ND  MSK:   Normal tone and bulk, no LEE  Neuro:  Grossly intact  Data Reviewed: Basic Metabolic Panel:  Recent Labs Lab 03/01/12 1940 03/02/12 0456  NA 141 137  K 4.1 4.5  CL 103 99  CO2 32 33*  GLUCOSE 96 125*  BUN 16 18  CREATININE 1.11 1.07  CALCIUM 8.6 8.5   Liver Function Tests: No results found for this basename: AST, ALT, ALKPHOS, BILITOT, PROT, ALBUMIN,  in the last 168 hours No results found for this basename: LIPASE,  AMYLASE,  in the last 168 hours No results found for this basename: AMMONIA,  in the last 168 hours CBC:  Recent Labs Lab 03/01/12 1940 03/02/12 0456  WBC 5.8 3.2*  HGB 11.9* 12.1*  HCT 39.8 40.0  MCV 85.6 84.4  PLT 180 157   Cardiac Enzymes:  Recent Labs Lab 03/01/12 2056 03/02/12 0810 03/02/12 1341  TROPONINI <0.30 <0.30 <0.30   BNP (last 3 results)  Recent Labs  03/10/11 1253 08/31/11 1928 03/01/12 1940  PROBNP 52.5 827.6* 5660.0*   CBG:  Recent Labs Lab 03/02/12 0032 03/02/12 0402 03/02/12 0729 03/02/12 1142  GLUCAP 185* 125* 119* 117*    Recent Results (from the past 240 hour(s))  MRSA PCR SCREENING     Status: Abnormal   Collection Time    03/02/12  4:23 AM      Result Value Range Status   MRSA by PCR POSITIVE (*)  NEGATIVE Final   Comment:            The GeneXpert MRSA Assay (FDA     approved for NASAL specimens     only), is one component of a     comprehensive MRSA colonization     surveillance program. It is not     intended to diagnose MRSA     infection nor to guide or     monitor treatment for     MRSA infections.     RESULT CALLED TO, READ BACK BY AND VERIFIED WITH:     B.ROTH AT 1107 ON [id number] BY C.BONGEL     Studies: Dg Chest 2 View  03/01/2012  *RADIOLOGY REPORT*  Clinical Data: Productive cough.  Smoker.  CHEST - 2 VIEW  Comparison: 09/01/2011.  Findings: Cardiomegaly.  Pulmonary vascular congestion/pulmonary edema superimposed on chronic changes with small bilateral pleural effusions.  Fullness of the hilar region more so than on the prior examination. This may represent pulmonary vasculature although underlying mass or adenopathy not excluded.  Mild basilar atelectasis.  IMPRESSION:  Cardiomegaly.  Pulmonary vascular congestion/pulmonary edema superimposed on chronic changes with small bilateral pleural effusions.  Fullness of the hilar region more so than on the prior examination. This may represent pulmonary vasculature although underlying mass or adenopathy not excluded.   Original Report Authenticated By: Lacy Duverney, M.D.     Scheduled Meds: . albuterol  2.5 mg Nebulization Q6H  . aspirin EC  325 mg Oral Daily  . carvedilol  3.125 mg Oral BID WC  . [START ON 03/03/2012] Chlorhexidine Gluconate Cloth  6 each Topical Q0600  . enoxaparin (LOVENOX) injection  40 mg Subcutaneous Q24H  . furosemide  40 mg Intravenous Q12H  . insulin aspart  0-9 Units Subcutaneous Q4H  . levofloxacin  750 mg Intravenous Q24H  . lisinopril  10 mg Oral Daily  . methylPREDNISolone (SOLU-MEDROL) injection  125 mg Intravenous Once  . methylPREDNISolone (SOLU-MEDROL) injection  80 mg Intravenous Q12H  . mupirocin ointment  1 application Nasal BID  . potassium chloride  20 mEq Oral BID  . sodium chloride   3 mL Intravenous Q12H  . sodium chloride  3 mL Intravenous Q12H  . sodium chloride  3 mL Intravenous Q12H   Continuous Infusions:   Principal Problem:   Hypoxemia Active Problems:   COPD with acute exacerbation   Chest pain   Schizoaffective disorder   Mental retardation, mild (I.Q. 50-70)   Hypothyroidism   HTN (hypertension)   Acute on chronic diastolic congestive heart failure   DM  w/o complication type II    Time spent: 30 min    SHORT, [facility]  [facility] Pager [phone]. If 7PM-7AM, please contact night-coverage at [email or web address], password [facility] 03/02/2012, 3:15 PM  LOS: 1 day

## 2012-03-03 ENCOUNTER — Encounter (HOSPITAL_COMMUNITY): Payer: Self-pay | Admitting: Internal Medicine

## 2012-03-03 ENCOUNTER — Inpatient Hospital Stay (HOSPITAL_COMMUNITY): Payer: Medicare Other

## 2012-03-03 LAB — BASIC METABOLIC PANEL
CO2: 36 mEq/L — ABNORMAL HIGH (ref 19–32)
Chloride: 99 mEq/L (ref 96–112)
Glucose, Bld: 173 mg/dL — ABNORMAL HIGH (ref 70–99)
Sodium: 141 mEq/L (ref 135–145)

## 2012-03-03 LAB — GLUCOSE, CAPILLARY
Glucose-Capillary: 113 mg/dL — ABNORMAL HIGH (ref 70–99)
Glucose-Capillary: 119 mg/dL — ABNORMAL HIGH (ref 70–99)
Glucose-Capillary: 107 mg/dL — ABNORMAL HIGH (ref 70–99)

## 2012-03-03 MED ORDER — LORAZEPAM 2 MG/ML IJ SOLN
INTRAMUSCULAR | Status: AC
  Administered 2012-03-03: 1 mg
  Filled 2012-03-03: qty 1

## 2012-03-03 MED ORDER — HYDRALAZINE HCL 20 MG/ML IJ SOLN: 10.0000 mg | INTRAMUSCULAR | Status: DC | PRN

## 2012-03-03 MED ORDER — FUROSEMIDE 10 MG/ML IJ SOLN
40.0000 mg | Freq: Two times a day (BID) | INTRAMUSCULAR | Status: DC
  Administered 2012-03-03 – 2012-03-05 (×4): 40 mg via INTRAVENOUS
  Filled 2012-03-03 (×6): qty 4

## 2012-03-03 MED ORDER — ALBUTEROL SULFATE (5 MG/ML) 0.5% IN NEBU
2.5000 mg | INHALATION_SOLUTION | Freq: Four times a day (QID) | RESPIRATORY_TRACT | Status: DC
  Administered 2012-03-04 – 2012-03-06 (×9): 2.5 mg via RESPIRATORY_TRACT
  Filled 2012-03-03 (×9): qty 0.5

## 2012-03-03 NOTE — Consult Note (Signed)
[facility]       CONSULTATION NOTE   Reason for Consult: Sinus pause, bradycardia  Requesting Physician: Dr. Malachi Bonds  Cardiologist: Gentry Fitz  HPI: This is a 57 y.o. male with a past medical history significant for COPD and Diastolic CHF who was brought to the ED due to worsening SOB, and wheezing. He had been found by his caretaker in the bathroom soiled and in the tub SOB and wheezing. He had had URI symptoms 1 week ago and was seen by his PCP. He was evaluated in the ED and found to have hypoxemia, with O2 saturations of 80% and was placed on supplemental Oxygen. He was found on Chest X-Ray to have Pulmonary Edema and small bilateral pleural effusions. His BNP was 5660. He was started on treatment for a CHF exacerbation and a COPD Flare. He was referred for medical admission. During his hospitalization on 03/02/12 he had a 5.25 second sinus pause at 8:05 pm.  He was apparently asymptomatic with this.  He is also expressing ongoing chest pain. EKG shows voltage for LVH, anterior T wave flattening/inversion which may represent ischemia or abnormal repolarization. We are asked to consult regarding the pause and chest pain.   PMHx:  Past Medical History  Diagnosis Date  . Seizures   . Psychiatric disorder   . Schizoaffective disorder   . Organic mood disorder   . Intermittent explosive disorder   . Generalized anxiety disorder   . Mild mental retardation   . COPD (chronic obstructive pulmonary disease)   . Peptic ulcer   . Thyroid disease   . Prostate hyperplasia with urinary obstruction   . Blind left eye   . Chronic respiratory failure   . Cancer   . Diabetes mellitus   . Heart attack   . Diagnosis unknown     pt poor historian unable to give information  . Anxiety   . Diastolic CHF 09/04/2011  . Hypertension   . Coronary artery disease    Past Surgical History  Procedure Laterality Date  . Hip pinning,cannulated  04/11/2011    Procedure:  CANNULATED HIP PINNING;  Surgeon: Mable Paris, MD;  Location: [facility] OR;  Service: Orthopedics;  Laterality: Left;  Perc. Screw Fixation/Left  . Fracture surgery      cannulated hip pinning 04/11/11    FAMHx: Family History  Problem Relation Age of Onset  . Hypertension      SOCHx:  reports that he has been smoking Cigarettes.  He has a 12.5 pack-year smoking history. He does not have any smokeless tobacco history on file. He reports that he drinks about 1.8 ounces of alcohol per week. He reports that he does not use illicit drugs.  ALLERGIES: Allergies  Allergen Reactions  . Peanuts (Peanut Oil) Other (See Comments)    Unknown reaction per caretaker  . Haldol (Haloperidol) Other (See Comments)    Unknown reaction per caretaker  . Other     Coffee-limit to 2 cups per caretaker; makes him violent  . Prolixin (Fluphenazine Hcl) Other (See Comments)    "muscle cramps"  . Thorazine (Chlorpromazine Hcl) Other (See Comments)    "burns"  . Lactose Intolerance (Gi) Rash    ROS: Review of systems not obtained due to patient factors.  HOME MEDICATIONS: Prescriptions prior to admission  Medication Sig Dispense Refill  . albuterol (PROVENTIL HFA;VENTOLIN HFA) 108 (90 BASE) MCG/ACT inhaler Inhale 2 puffs into the lungs every 6 (six) hours as  needed for wheezing.  1 Inhaler  2  . amLODipine (NORVASC) 10 MG tablet Take 10 mg by mouth every morning.      . bimatoprost (LUMIGAN) 0.01 % SOLN Place 1 drop into both eyes at bedtime.       . Brinzolamide-Brimonidine (SIMBRINZA) 1-0.2 % SUSP Place 1 drop into the left eye 3 (three) times daily.      Marland Kitchen levothyroxine (SYNTHROID, LEVOTHROID) 75 MCG tablet Take 75 mcg by mouth every morning.       . lithium carbonate 300 MG capsule Take 300 mg by mouth 3 (three) times daily with meals. Takes 0900, 1600 & 2000      . risperiDONE (RISPERDAL) 1 MG tablet Take 1 mg by mouth every morning.       . risperiDONE (RISPERDAL) 2 MG tablet Take 2 mg by  mouth daily at 8 pm.       . Tamsulosin HCl (FLOMAX) 0.4 MG CAPS Take 0.4 mg by mouth daily after supper.      . temazepam (RESTORIL) 30 MG capsule Take 30 mg by mouth every evening.      . traZODone (DESYREL) 100 MG tablet Take 100 mg by mouth at bedtime.          HOSPITAL MEDICATIONS: Prior to Admission:  Prescriptions prior to admission  Medication Sig Dispense Refill  . albuterol (PROVENTIL HFA;VENTOLIN HFA) 108 (90 BASE) MCG/ACT inhaler Inhale 2 puffs into the lungs every 6 (six) hours as needed for wheezing.  1 Inhaler  2  . amLODipine (NORVASC) 10 MG tablet Take 10 mg by mouth every morning.      . bimatoprost (LUMIGAN) 0.01 % SOLN Place 1 drop into both eyes at bedtime.       . Brinzolamide-Brimonidine (SIMBRINZA) 1-0.2 % SUSP Place 1 drop into the left eye 3 (three) times daily.      Marland Kitchen levothyroxine (SYNTHROID, LEVOTHROID) 75 MCG tablet Take 75 mcg by mouth every morning.       . lithium carbonate 300 MG capsule Take 300 mg by mouth 3 (three) times daily with meals. Takes 0900, 1600 & 2000      . risperiDONE (RISPERDAL) 1 MG tablet Take 1 mg by mouth every morning.       . risperiDONE (RISPERDAL) 2 MG tablet Take 2 mg by mouth daily at 8 pm.       . Tamsulosin HCl (FLOMAX) 0.4 MG CAPS Take 0.4 mg by mouth daily after supper.      . temazepam (RESTORIL) 30 MG capsule Take 30 mg by mouth every evening.      . traZODone (DESYREL) 100 MG tablet Take 100 mg by mouth at bedtime.          VITALS: Blood pressure 133/98, pulse 65, temperature 97.9 F (36.6 C), temperature source Oral, resp. rate 18, height 5\' 10"  (1.778 m), weight 72.666 kg (160 lb 3.2 oz), SpO2 99.00%.  PHYSICAL EXAM: General appearance: alert and severe mental impairment Neck: JVD - 2 cm above sternal notch, no adenopathy, no carotid bruit, supple, symmetrical, trachea midline and thyroid not enlarged, symmetric, no tenderness/mass/nodules Lungs: diminished breath sounds bilaterally and wheezes bilaterally Heart:  regular rate and rhythm Abdomen: soft, non-tender; bowel sounds normal; no masses,  no organomegaly Extremities: extremities normal, atraumatic, no cyanosis or edema Pulses: 2+ and symmetric Skin: Skin color, texture, turgor normal. No rashes or lesions Neurologic: Mental status: Alert, severe cognitive impairment, difficult to converse with  LABS: Results for orders placed during  the hospital encounter of 03/01/12 (from the past 48 hour(s))  CBC     Status: Abnormal   Collection Time    03/01/12  7:40 PM      Result Value Range   WBC 5.8  4.0 - 10.5 K/uL   RBC 4.65  4.22 - 5.81 MIL/uL   Hemoglobin 11.9 (*) 13.0 - 17.0 g/dL   HCT 16.1  09.6 - 04.5 %   MCV 85.6  78.0 - 100.0 fL   MCH 25.6 (*) 26.0 - 34.0 pg   MCHC 29.9 (*) 30.0 - 36.0 g/dL   RDW 40.9  81.1 - 91.4 %   Platelets 180  150 - 400 K/uL  BASIC METABOLIC PANEL     Status: Abnormal   Collection Time    03/01/12  7:40 PM      Result Value Range   Sodium 141  135 - 145 mEq/L   Potassium 4.1  3.5 - 5.1 mEq/L   Chloride 103  96 - 112 mEq/L   CO2 32  19 - 32 mEq/L   Glucose, Bld 96  70 - 99 mg/dL   BUN 16  6 - 23 mg/dL   Creatinine, Ser 7.82  0.50 - 1.35 mg/dL   Calcium 8.6  8.4 - 95.6 mg/dL   GFR calc non Af Amer 71 (*) >90 mL/min   GFR calc Af Amer 83 (*) >90 mL/min   Comment:            The eGFR has been calculated     using the CKD EPI equation.     This calculation has not been     validated in all clinical     situations.     eGFR's persistently     <90 mL/min signify     possible Chronic Kidney Disease.  PRO B NATRIURETIC PEPTIDE     Status: Abnormal   Collection Time    03/01/12  7:40 PM      Result Value Range   Pro B Natriuretic peptide (BNP) 5660.0 (*) 0 - 125 pg/mL  TROPONIN I     Status: None   Collection Time    03/01/12  8:56 PM      Result Value Range   Troponin I <0.30  <0.30 ng/mL   Comment:            Due to the release kinetics of cTnI,     a negative result within the first hours     of  the onset of symptoms does not rule out     myocardial infarction with certainty.     If myocardial infarction is still suspected,     repeat the test at appropriate intervals.  TSH     Status: None   Collection Time    03/01/12 11:55 PM      Result Value Range   TSH 1.689  0.350 - 4.500 uIU/mL  GLUCOSE, CAPILLARY     Status: Abnormal   Collection Time    03/02/12 12:32 AM      Result Value Range   Glucose-Capillary 185 (*) 70 - 99 mg/dL  GLUCOSE, CAPILLARY     Status: Abnormal   Collection Time    03/02/12  4:02 AM      Result Value Range   Glucose-Capillary 125 (*) 70 - 99 mg/dL   Comment 1 Documented in Chart     Comment 2 Notify RN    MRSA PCR SCREENING     Status:  Abnormal   Collection Time    03/02/12  4:23 AM      Result Value Range   MRSA by PCR POSITIVE (*) NEGATIVE   Comment:            The GeneXpert MRSA Assay (FDA     approved for NASAL specimens     only), is one component of a     comprehensive MRSA colonization     surveillance program. It is not     intended to diagnose MRSA     infection nor to guide or     monitor treatment for     MRSA infections.     RESULT CALLED TO, READ BACK BY AND VERIFIED WITH:     B.ROTH AT 1107 ON [id number] BY C.BONGEL  BASIC METABOLIC PANEL     Status: Abnormal   Collection Time    03/02/12  4:56 AM      Result Value Range   Sodium 137  135 - 145 mEq/L   Potassium 4.5  3.5 - 5.1 mEq/L   Chloride 99  96 - 112 mEq/L   CO2 33 (*) 19 - 32 mEq/L   Glucose, Bld 125 (*) 70 - 99 mg/dL   BUN 18  6 - 23 mg/dL   Creatinine, Ser 1.47  0.50 - 1.35 mg/dL   Calcium 8.5  8.4 - 82.9 mg/dL   GFR calc non Af Amer 75 (*) >90 mL/min   GFR calc Af Amer 87 (*) >90 mL/min   Comment:            The eGFR has been calculated     using the CKD EPI equation.     This calculation has not been     validated in all clinical     situations.     eGFR's persistently     <90 mL/min signify     possible Chronic Kidney Disease.  CBC     Status:  Abnormal   Collection Time    03/02/12  4:56 AM      Result Value Range   WBC 3.2 (*) 4.0 - 10.5 K/uL   RBC 4.74  4.22 - 5.81 MIL/uL   Hemoglobin 12.1 (*) 13.0 - 17.0 g/dL   HCT 56.2  13.0 - 86.5 %   MCV 84.4  78.0 - 100.0 fL   MCH 25.5 (*) 26.0 - 34.0 pg   MCHC 30.3  30.0 - 36.0 g/dL   RDW 78.4  69.6 - 29.5 %   Platelets 157  150 - 400 K/uL  HEMOGLOBIN A1C     Status: None   Collection Time    03/02/12  4:56 AM      Result Value Range   Hemoglobin A1C 5.6  <5.7 %   Comment: (NOTE)                                                                               According to the ADA Clinical Practice Recommendations for 2011, when     HbA1c is used as a screening test:      >=6.5%   Diagnostic of Diabetes Mellitus               (  if abnormal result is confirmed)     5.7-6.4%   Increased risk of developing Diabetes Mellitus     References:Diagnosis and Classification of Diabetes Mellitus,Diabetes     Care,2011,34(Suppl 1):S62-S69 and Standards of Medical Care in             Diabetes - 2011,Diabetes Care,2011,34 (Suppl 1):S11-S61.   Mean Plasma Glucose 114  <117 mg/dL  GLUCOSE, CAPILLARY     Status: Abnormal   Collection Time    03/02/12  7:29 AM      Result Value Range   Glucose-Capillary 119 (*) 70 - 99 mg/dL  TROPONIN I     Status: None   Collection Time    03/02/12  8:10 AM      Result Value Range   Troponin I <0.30  <0.30 ng/mL   Comment:            Due to the release kinetics of cTnI,     a negative result within the first hours     of the onset of symptoms does not rule out     myocardial infarction with certainty.     If myocardial infarction is still suspected,     repeat the test at appropriate intervals.  URINALYSIS, ROUTINE W REFLEX MICROSCOPIC     Status: None   Collection Time    03/02/12 11:04 AM      Result Value Range   Color, Urine YELLOW  YELLOW   APPearance CLEAR  CLEAR   Specific Gravity, Urine 1.009  1.005 - 1.030   pH 6.5  5.0 - 8.0   Glucose, UA  NEGATIVE  NEGATIVE mg/dL   Hgb urine dipstick NEGATIVE  NEGATIVE   Bilirubin Urine NEGATIVE  NEGATIVE   Ketones, ur NEGATIVE  NEGATIVE mg/dL   Protein, ur NEGATIVE  NEGATIVE mg/dL   Urobilinogen, UA 0.2  0.0 - 1.0 mg/dL   Nitrite NEGATIVE  NEGATIVE   Leukocytes, UA NEGATIVE  NEGATIVE   Comment: MICROSCOPIC NOT DONE ON URINES WITH NEGATIVE PROTEIN, BLOOD, LEUKOCYTES, NITRITE, OR GLUCOSE <1000 mg/dL.  GLUCOSE, CAPILLARY     Status: Abnormal   Collection Time    03/02/12 11:42 AM      Result Value Range   Glucose-Capillary 117 (*) 70 - 99 mg/dL  TROPONIN I     Status: None   Collection Time    03/02/12  1:41 PM      Result Value Range   Troponin I <0.30  <0.30 ng/mL   Comment:            Due to the release kinetics of cTnI,     a negative result within the first hours     of the onset of symptoms does not rule out     myocardial infarction with certainty.     If myocardial infarction is still suspected,     repeat the test at appropriate intervals.  GLUCOSE, CAPILLARY     Status: Abnormal   Collection Time    03/02/12  4:43 PM      Result Value Range   Glucose-Capillary 142 (*) 70 - 99 mg/dL  TROPONIN I     Status: None   Collection Time    03/02/12  7:21 PM      Result Value Range   Troponin I <0.30  <0.30 ng/mL   Comment:            Due to the release kinetics of cTnI,     a negative  result within the first hours     of the onset of symptoms does not rule out     myocardial infarction with certainty.     If myocardial infarction is still suspected,     repeat the test at appropriate intervals.  GLUCOSE, CAPILLARY     Status: Abnormal   Collection Time    03/02/12  8:01 PM      Result Value Range   Glucose-Capillary 131 (*) 70 - 99 mg/dL  TROPONIN I     Status: None   Collection Time    03/02/12  9:33 PM      Result Value Range   Troponin I <0.30  <0.30 ng/mL   Comment:            Due to the release kinetics of cTnI,     a negative result within the first hours      of the onset of symptoms does not rule out     myocardial infarction with certainty.     If myocardial infarction is still suspected,     repeat the test at appropriate intervals.  GLUCOSE, CAPILLARY     Status: Abnormal   Collection Time    03/02/12 11:34 PM      Result Value Range   Glucose-Capillary 114 (*) 70 - 99 mg/dL   Comment 1 Notify RN     Comment 2 Documented in Chart    GLUCOSE, CAPILLARY     Status: Abnormal   Collection Time    03/03/12  4:02 AM      Result Value Range   Glucose-Capillary 184 (*) 70 - 99 mg/dL   Comment 1 Documented in Chart     Comment 2 Notify RN    BASIC METABOLIC PANEL     Status: Abnormal   Collection Time    03/03/12  4:38 AM      Result Value Range   Sodium 141  135 - 145 mEq/L   Potassium 4.2  3.5 - 5.1 mEq/L   Chloride 99  96 - 112 mEq/L   CO2 36 (*) 19 - 32 mEq/L   Glucose, Bld 173 (*) 70 - 99 mg/dL   BUN 27 (*) 6 - 23 mg/dL   Creatinine, Ser 4.54  0.50 - 1.35 mg/dL   Calcium 8.8  8.4 - 09.8 mg/dL   GFR calc non Af Amer 70 (*) >90 mL/min   GFR calc Af Amer 81 (*) >90 mL/min   Comment:            The eGFR has been calculated     using the CKD EPI equation.     This calculation has not been     validated in all clinical     situations.     eGFR's persistently     <90 mL/min signify     possible Chronic Kidney Disease.  TROPONIN I     Status: None   Collection Time    03/03/12  4:38 AM      Result Value Range   Troponin I <0.30  <0.30 ng/mL   Comment:            Due to the release kinetics of cTnI,     a negative result within the first hours     of the onset of symptoms does not rule out     myocardial infarction with certainty.     If myocardial infarction is still suspected,     repeat the  test at appropriate intervals.  GLUCOSE, CAPILLARY     Status: Abnormal   Collection Time    03/03/12  7:59 AM      Result Value Range   Glucose-Capillary 115 (*) 70 - 99 mg/dL  GLUCOSE, CAPILLARY     Status: Abnormal   Collection  Time    03/03/12 11:58 AM      Result Value Range   Glucose-Capillary 113 (*) 70 - 99 mg/dL    IMAGING: Dg Chest 2 View  03/01/2012  *RADIOLOGY REPORT*  Clinical Data: Productive cough.  Smoker.  CHEST - 2 VIEW  Comparison: 09/01/2011.  Findings: Cardiomegaly.  Pulmonary vascular congestion/pulmonary edema superimposed on chronic changes with small bilateral pleural effusions.  Fullness of the hilar region more so than on the prior examination. This may represent pulmonary vasculature although underlying mass or adenopathy not excluded.  Mild basilar atelectasis.  IMPRESSION:  Cardiomegaly.  Pulmonary vascular congestion/pulmonary edema superimposed on chronic changes with small bilateral pleural effusions.  Fullness of the hilar region more so than on the prior examination. This may represent pulmonary vasculature although underlying mass or adenopathy not excluded.   Original Report Authenticated By: Lacy Duverney, M.D.     IMPRESSION: 1. Sinus pause / asystole - 5.25 seconds 2. Ongoing chest pain / dyspnea - 4 negative CTA's since December 2012 3. Acute on chronic diastolic heart failure  RECOMMENDATION: 1.  Unfortunately he is significantly mentally handicapped and it is quite difficult to know if his chest pain complaints are legitimate or not. He has 4 CTA's since December 2012 (very high radiation exposure) - none of which showed PE, however, there is some CAD.  He had an NST in 2003 - EF was 45%, ?anteroapical ischemia. He has risk factors including tobacco abuse, age, HTN and CXR findings of cardiomegaly. Recent echo revealed low normal EF with ?wall motion abnormality. I think there are 2 issues - namely chest pain and a sinus pause. The pause could be attributed to conduction system or sinus node disease, hypoxia or ischemia. I'm not sure that he is having "continual" cardiac chest pain, however, a work-up for coronary ischemia seems warranted (although I am hesitant to expose him to more  radiation). I would submit if this is negative, then we should consider pacemaker.  While technically it may be indicated, his poor cognitive function he may be at high risk for device dislodgement or lead dislodgement post-procedure which may be a relative contraindication.  1. Lexiscan nuclear stress test (or dobutamine if it is felt that lexiscan will not be tolerated due to his           COPD/wheezing). 2. Will discuss with my partners about possibility of pacemaker.   3. I don't think he can entirely comprehend either the test or procedure enough to provide informed consent      therefore we would have to discuss with his healthcare power of attorney.   Time Spent Directly with Patient: 30 minutes  Chrystie Nose, MD, [facility] Attending Cardiologist [facility]  HILTY,Kenneth C 03/03/2012, 1:00 PM

## 2012-03-03 NOTE — Progress Notes (Signed)
Shift event: RN paged NP 2/2 pt having seizure like activity followed by lethargy and poor responsiveness. NP to unit. Rapid response RN here as well. Pt vitals are fine and is satting well on O2 per [place]. Normal resp effort. Responds somewhat to name calling and tactile stimulation at first, then later is awake and talking at his baseline mental status.  NP witnessed one short episode of seizure like activity where pt's eyelids were twitching and upper extremities shaking for just a few seconds. He responded at normal baseline shortly after activity. He was able to conversate shortly after about his living in a group home.  PERRL. No tongue biting or incontinence noted. His head is turned to the right during activity.   NP thought pt looked familiar as these type episodes seemed familiar. 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 He remains off seizure meds at home now.  Psych has consulted several times in the past for various reasons including schizoaffective disorder and mood disorder. Will defer reconsult to attending in am.   This NP called, Alycia Rossetti, his caregiver at the group home. Alycia Rossetti said pt either "fakes seizures or c/o chest pain", "heart attacks" when he is getting ready to be discharged.  Will cont to follow. Will report to attending in am.   Jimmye Norman, NP [facility]

## 2012-03-03 NOTE — Progress Notes (Signed)
Patient removed iv from arm while getting out of bed. He removed posey restraints and went to bathroom to drink water out of the faucet. Patient was unharmed, and I was able to get him back into bed and restart his IV. Hence forth, I will give him small cups of ice in 4 oz cup QHr to quench his voracious thirst in hopes this will prevent another incident of his getting out of bed to get it.

## 2012-03-03 NOTE — Progress Notes (Signed)
[facility] PROGRESS NOTE  [name] JXB:[id number] DOB: 1955/04/06 DOA: 03/01/2012 PCP: Henri Medal, MD  Assessment/Plan  Acute hypoxic respiratory failure likely due to acute diastolic CHF exacerbation and acute COPD exacerbation.  Likely more diastolic heart failure given rales and elevated BNP compared to COPD so will taper steroids more quickly.   Continues to have chest tightness and SOB.    For acute diastolic CHF:  -5.78 L yesterday, wt down 3kg, but still hypoxic. BUN rising, but may be due to starting ACEI. -  Daily weights and strict I/O -  Continue lasix -  Repeat CXR -  Continue telemetry -  Wean oxygen as tolerated  For acute COPD exac: -  Prednisone 40mg  tomorrow for quick taper -  Peanut allergy so no ipratropium -  Continue albuterol prn -  Levofloxacin day 3  Chest Pain, likely due to cough and wheezing -  Troponins neg x 5  Pause 5.2 sec on telemetry.  Patient did receive a dose of carvedilol yesterday, but none last night.  Bradycardic to the 40s, but no other significant pauses noted.   -  Appreciate cardiology assistance -  Atropine prn -  External pacing prn  HTN- control BP in setting of diastolic heart failure. Mildly elevated -  Continue low dose norvasc -  continue ACEI 2/2 diabetes.  -  lasix as above -  Await cardiology recommendations -  Add PRN hydralazine for SBP > 180  Hypothyroid, stable, TSH 1.7 - cont synthroid  Schizoaffective disorder and Mental retardation - unchanged and stable.   DM2- FS well controlled -  continue SSi  Dysuria:  Rule out UTI -  UA neg  Diet:  diabetic Access:  PIV IVF:  none Proph:  lovenox  Code Status: full code Family Communication: spoke with patient alone Disposition Plan: pending improvement in respiratory status. Pt ambulates without difficulty  Consultants:  none  Procedures:  none  Antibiotics:  Levofloxacin 3/1 >>   HPI/Subjective:  Denies fevers, chills.  +  persistent chest pressure and shortness of breath.   Denies nausea, vomiting, diarrhea.  Denies constipation.    Objective: Filed Vitals:   03/03/12 0500 03/03/12 0627 03/03/12 0921 03/03/12 1049  BP:  150/79  133/98  Pulse:  49  65  Temp:  98.3 F (36.8 C)  97.9 F (36.6 C)  TempSrc:  Oral  Oral  Resp:  18  18  Height:      Weight: 72.666 kg (160 lb 3.2 oz)     SpO2:  97% 96% 99%    Intake/Output Summary (Last 24 hours) at 03/03/12 1237 Last data filed at 03/03/12 1200  Gross per 24 hour  Intake    690 ml  Output   8200 ml  Net  -7510 ml   Filed Weights   03/02/12 0045 03/02/12 0500 03/03/12 0500  Weight: 77 kg (169 lb 12.1 oz) 76.5 kg (168 lb 10.4 oz) 72.666 kg (160 lb 3.2 oz)    Exam:   General:  Obese male, no acute distress, but nasal canula still in place  HEENT:  NCAT, MMM  Cardiovascular:  RRR, nl S1, S2 no mrg, 2+ pulses, warm extremities  Respiratory:  Rales to the apices, and full expiratory wheeze, no rhonchi.    Abdomen:  NABS, soft, NT/ND  MSK:   Normal tone and bulk, no LEE  Neuro:  Grossly intact  Data Reviewed: Basic Metabolic Panel:  Recent Labs Lab 03/01/12 1940 03/02/12 0456 03/03/12 1308  NA 141 137 141  K 4.1 4.5 4.2  CL 103 99 99  CO2 32 33* 36*  GLUCOSE 96 125* 173*  BUN 16 18 27*  CREATININE 1.11 1.07 1.13  CALCIUM 8.6 8.5 8.8   Liver Function Tests: No results found for this basename: AST, ALT, ALKPHOS, BILITOT, PROT, ALBUMIN,  in the last 168 hours No results found for this basename: LIPASE, AMYLASE,  in the last 168 hours No results found for this basename: AMMONIA,  in the last 168 hours CBC:  Recent Labs Lab 03/01/12 1940 03/02/12 0456  WBC 5.8 3.2*  HGB 11.9* 12.1*  HCT 39.8 40.0  MCV 85.6 84.4  PLT 180 157   Cardiac Enzymes:  Recent Labs Lab 03/02/12 0810 03/02/12 1341 03/02/12 1921 03/02/12 2133 03/03/12 0438  TROPONINI <0.30 <0.30 <0.30 <0.30 <0.30   BNP (last 3 results)  Recent Labs   03/10/11 1253 08/31/11 1928 03/01/12 1940  PROBNP 52.5 827.6* 5660.0*   CBG:  Recent Labs Lab 03/02/12 2001 03/02/12 2334 03/03/12 0402 03/03/12 0759 03/03/12 1158  GLUCAP 131* 114* 184* 115* 113*    Recent Results (from the past 240 hour(s))  MRSA PCR SCREENING     Status: Abnormal   Collection Time    03/02/12  4:23 AM      Result Value Range Status   MRSA by PCR POSITIVE (*) NEGATIVE Final   Comment:            The GeneXpert MRSA Assay (FDA     approved for NASAL specimens     only), is one component of a     comprehensive MRSA colonization     surveillance program. It is not     intended to diagnose MRSA     infection nor to guide or     monitor treatment for     MRSA infections.     RESULT CALLED TO, READ BACK BY AND VERIFIED WITH:     B.ROTH AT 1107 ON [id number] BY C.BONGEL     Studies: Dg Chest 2 View  03/01/2012  *RADIOLOGY REPORT*  Clinical Data: Productive cough.  Smoker.  CHEST - 2 VIEW  Comparison: 09/01/2011.  Findings: Cardiomegaly.  Pulmonary vascular congestion/pulmonary edema superimposed on chronic changes with small bilateral pleural effusions.  Fullness of the hilar region more so than on the prior examination. This may represent pulmonary vasculature although underlying mass or adenopathy not excluded.  Mild basilar atelectasis.  IMPRESSION:  Cardiomegaly.  Pulmonary vascular congestion/pulmonary edema superimposed on chronic changes with small bilateral pleural effusions.  Fullness of the hilar region more so than on the prior examination. This may represent pulmonary vasculature although underlying mass or adenopathy not excluded.   Original Report Authenticated By: Lacy Duverney, M.D.     Scheduled Meds: . albuterol  2.5 mg Nebulization Q4H  . amLODipine  2.5 mg Oral Daily  . aspirin EC  325 mg Oral Daily  . Chlorhexidine Gluconate Cloth  6 each Topical Q0600  . enoxaparin (LOVENOX) injection  40 mg Subcutaneous Q24H  . furosemide  40 mg  Intravenous Q12H  . insulin aspart  0-9 Units Subcutaneous Q4H  . levofloxacin  750 mg Intravenous Q24H  . lisinopril  10 mg Oral Daily  . methylPREDNISolone (SOLU-MEDROL) injection  125 mg Intravenous Once  . mupirocin ointment  1 application Nasal BID  . predniSONE  40 mg Oral Q breakfast  . sodium chloride  3 mL Intravenous Q12H  . sodium chloride  3 mL  Intravenous Q12H  . sodium chloride  3 mL Intravenous Q12H   Continuous Infusions:   Principal Problem:   Hypoxemia Active Problems:   COPD with acute exacerbation   Chest pain   Schizoaffective disorder   Mental retardation, mild (I.Q. 50-70)   Hypothyroidism   HTN (hypertension)   Acute on chronic diastolic congestive heart failure   DM w/o complication type II    Time spent: 30 min    SHORT, MACKENZIE  [facility] Pager [phone]. If 7PM-7AM, please contact night-coverage at [email or web address], password [facility] 03/03/2012, 12:37 PM  LOS: 2 days

## 2012-03-03 NOTE — Progress Notes (Signed)
   CARE MANAGEMENT NOTE 03/03/2012  Patient:  [name], [name]   Account Number:  [id number]  Date Initiated:  03/02/2012  Documentation initiated by:  [facility]  Subjective/Objective Assessment:   57 year old male admitted with CHF and COPD exacerbation.     Action/Plan:   Lives at a group home.   Anticipated DC Date:  03/05/2012   Anticipated DC Plan:  GROUP HOME  In-house referral  Clinical Social Worker      DC Planning Services  CM consult             Status of service:  In process, will continue to follow Medicare Important Message given?  NA - LOS <3 / Initial given by admissions (If response is "NO", the following Medicare IM given date fields will be blank) Per UR Regulation:  Reviewed for med. necessity/level of care/duration of stay Comments:  03/03/2012- PATIENT WITH COPD, CHF, SCHIZOAFFECTIVE DISORDER; HAS A PRIMARY CARE MD- DR Ludwig Clarks AT [facility], LIVES IN A GROUP HOME AND IS INDEPENDENT OF ALL OF HIS ADL'S; HAS MEDICARE INSURANCE; CM FOLLOWING FOR DCP; B CHANDLER RN,BSN,MHA

## 2012-03-04 ENCOUNTER — Inpatient Hospital Stay (HOSPITAL_COMMUNITY)
Admit: 2012-03-04 | Discharge: 2012-03-04 | Disposition: A | Discharge status: Home / Self Care | Payer: Medicare Other | Attending: Internal Medicine | Admitting: Internal Medicine

## 2012-03-04 ENCOUNTER — Other Ambulatory Visit: Payer: Self-pay

## 2012-03-04 ENCOUNTER — Ambulatory Visit (HOSPITAL_COMMUNITY)
Admit: 2012-03-04 | Discharge: 2012-03-04 | Disposition: A | Discharge status: Home / Self Care | Payer: Medicare Other | Attending: Internal Medicine | Admitting: Internal Medicine

## 2012-03-04 LAB — BASIC METABOLIC PANEL
CO2: 35 mEq/L — ABNORMAL HIGH (ref 19–32)
Calcium: 8.5 mg/dL (ref 8.4–10.5)
Creatinine, Ser: 1.12 mg/dL (ref 0.50–1.35)

## 2012-03-04 LAB — GLUCOSE, CAPILLARY: Glucose-Capillary: 94 mg/dL (ref 70–99)

## 2012-03-04 MED ORDER — REGADENOSON 0.4 MG/5ML IV SOLN
0.4000 mg | Freq: Once | INTRAVENOUS | Status: AC
  Administered 2012-03-04: 0.4 mg via INTRAVENOUS

## 2012-03-04 MED ORDER — PREDNISONE 20 MG PO TABS
30.0000 mg | ORAL_TABLET | Freq: Every day | ORAL | Status: DC
  Administered 2012-03-05 – 2012-03-06 (×2): 30 mg via ORAL
  Filled 2012-03-04 (×3): qty 1

## 2012-03-04 MED ORDER — TECHNETIUM TC 99M SESTAMIBI GENERIC - CARDIOLITE
10.0000 | Freq: Once | INTRAVENOUS | Status: AC | PRN
  Administered 2012-03-04: 10 via INTRAVENOUS

## 2012-03-04 MED ORDER — TECHNETIUM TC 99M SESTAMIBI GENERIC - CARDIOLITE
30.0000 | Freq: Once | INTRAVENOUS | Status: AC | PRN
  Administered 2012-03-04: 30 via INTRAVENOUS

## 2012-03-04 NOTE — Progress Notes (Signed)
[facility] PROGRESS NOTE  [name] ZOX:[id number] DOB: [birth year] DOA: 03/01/2012 PCP: Henri Medal, MD  Assessment/Plan  Acute hypoxic respiratory failure.  Initially thought to be due to acute diastolic CHF versus acute COPD exacerbation with CHF being the more likely of the two diagnoses in the setting of rales and elevated BNP.  Cardiology was consulted due to a >5second sinus pause on telemetry and stress test today demonstrated diminished EF and evidence of irreversible ischemia.    Abnl stress test without evidence of reversible ischemia and possibly diminished EF (cardiac silhouette is enlarged on CXR) -  Appreciate cardiology assistance -  ECHO to evaluate EF -  ACEI -  Holding beta blocker due to recent pause  For acute diastolic (or systolic) CHF:  -3.6 L yesterday, wt down 3kg, but still hypoxic. BUN stable (recently added ACEI). -  Daily weights and strict I/O -  Continue lasix -  Repeat CXR demonstrated improvement in pulmonary edema -  Wean oxygen as tolerated  For acute COPD exac: -  Wean to prednisone 30mg  for quick taper -  Peanut allergy so no ipratropium -  Continue albuterol prn -  Levofloxacin day 4/5  Pause 5.2 sec on telemetry.  Patient did receive a dose of carvedilol yesterday, but none last night.  Bradycardic to the 40s, but no other significant pauses noted.   -  Appreciate cardiology assistance -  Atropine prn -  External pacing prn  HTN- control BP in setting of diastolic heart failure. Mildly elevated -  Continue norvasc -  continue ACEI 2/2 diabetes.  -  lasix as above -  Appreciate cardiology assistance -  Add PRN hydralazine for SBP > 180  Chest Pain, chronic.  Multiple CTa chest in past negative.  Patient often c/o chest pain to avoid being discharged.  likely due to cough and wheezing -  Troponins neg x 5 -  Stress test negative for acute ischemia  Hypothyroid, stable, TSH 1.7 - cont synthroid  Schizoaffective  disorder, hx of pseudoseizure, and Mental retardation - unchanged and stable.  Patient had "seizure like activity" overnight 3/3>>3/4, however, after reviewing the records, the patient has had an extensive work up for seizure which was negative.  He frequently c/o chest pain and has pseudoseizures to prolong hospitalization.  He asks every day to not be discharged.    DM2- FS well controlled -  continue SSi  Diet:  diabetic Access:  PIV IVF:  none Proph:  lovenox  Code Status: full code Family Communication: spoke with patient alone Disposition Plan: pending further evaluation for systolic heart failure (ICM) and improvement in respiratory status.    Consultants:  Cardiology  Procedures:  none  Antibiotics:  Levofloxacin 3/1 >>   HPI/Subjective:  Denies fevers, chills.  + persistent chest pressure and shortness of breath.   Denies nausea, vomiting, diarrhea, constipation.  Getting ready for transport [facility] for stress test  Objective: Filed Vitals:   03/04/12 0455 03/04/12 0739 03/04/12 1332 03/04/12 1607  BP: 120/81  142/87   Pulse: 67     Temp: 98.6 F (37 C)     TempSrc: Oral     Resp: 18     Height:      Weight: 70.761 kg (156 lb)     SpO2: 93% 91%  94%    Intake/Output Summary (Last 24 hours) at 03/04/12 1651 Last data filed at 03/04/12 1508  Gross per 24 hour  Intake    240 ml  Output  3650 ml  Net  -3410 ml   Filed Weights   03/02/12 0500 03/03/12 0500 03/04/12 0455  Weight: 76.5 kg (168 lb 10.4 oz) 72.666 kg (160 lb 3.2 oz) 70.761 kg (156 lb)    Exam:   General:  Obese male, no acute distress, nasal canula in place  HEENT:  NCAT, MMM  Cardiovascular:  RRR, nl S1, S2 no mrg, 2+ pulses, warm extremities  Respiratory:  Rales to the bases, no wheeze, no rhonchi.    Abdomen:  NABS, soft, NT/ND  MSK:   Normal tone and bulk, no LEE  Neuro:  Grossly intact  Data Reviewed: Basic Metabolic Panel:  Recent Labs Lab 03/01/12 1940  03/02/12 0456 03/03/12 0438 03/04/12 0424  NA 141 137 141 138  K 4.1 4.5 4.2 3.8  CL 103 99 99 98  CO2 32 33* 36* 35*  GLUCOSE 96 125* 173* 97  BUN 16 18 27* 27*  CREATININE 1.11 1.07 1.13 1.12  CALCIUM 8.6 8.5 8.8 8.5   Liver Function Tests: No results found for this basename: AST, ALT, ALKPHOS, BILITOT, PROT, ALBUMIN,  in the last 168 hours No results found for this basename: LIPASE, AMYLASE,  in the last 168 hours No results found for this basename: AMMONIA,  in the last 168 hours CBC:  Recent Labs Lab 03/01/12 1940 03/02/12 0456  WBC 5.8 3.2*  HGB 11.9* 12.1*  HCT 39.8 40.0  MCV 85.6 84.4  PLT 180 157   Cardiac Enzymes:  Recent Labs Lab 03/02/12 0810 03/02/12 1341 03/02/12 1921 03/02/12 2133 03/03/12 0438  TROPONINI <0.30 <0.30 <0.30 <0.30 <0.30   BNP (last 3 results)  Recent Labs  03/10/11 1253 08/31/11 1928 03/01/12 1940  PROBNP 52.5 827.6* 5660.0*   CBG:  Recent Labs Lab 03/03/12 1624 03/03/12 2019 03/04/12 0029 03/04/12 0421 03/04/12 0746  GLUCAP 119* 107* 119* 121* 94    Recent Results (from the past 240 hour(s))  MRSA PCR SCREENING     Status: Abnormal   Collection Time    03/02/12  4:23 AM      Result Value Range Status   MRSA by PCR POSITIVE (*) NEGATIVE Final   Comment:            The GeneXpert MRSA Assay (FDA     approved for NASAL specimens     only), is one component of a     comprehensive MRSA colonization     surveillance program. It is not     intended to diagnose MRSA     infection nor to guide or     monitor treatment for     MRSA infections.     RESULT CALLED TO, READ BACK BY AND VERIFIED WITH:     B.ROTH AT 1107 ON [id number] BY C.BONGEL     Studies: Nm Myocar Multi W/spect W/wall Motion / Ef  03/04/2012  *RADIOLOGY REPORT*  Clinical Data:  Chest pain.  MYOCARDIAL IMAGING WITH SPECT (REST AND PHARMACOLOGIC-STRESS) GATED LEFT VENTRICULAR WALL MOTION STUDY LEFT VENTRICULAR EJECTION FRACTION  Technique:  Standard  myocardial SPECT imaging was performed after resting intravenous injection of 10 mCi Tc-69m sestamibi. Subsequently, intravenous infusion of Lexiscan was performed under the supervision of the Cardiology staff.  At peak effect of the drug, 30 mCi Tc-52m sestamibi was injected intravenously and standard myocardial SPECT  imaging was performed.  Quantitative gated imaging was also performed to evaluate left ventricular wall motion, and estimate left ventricular ejection fraction.  Comparison:  None  Findings:  The left ventricle is dilated.  There is an inferior scar.  Global hypokinesis noted on gated SPECT images with estimated ejection fraction at 31%.  No reversible defects to suggest ischemia.  IMPRESSION:  1.  Left ventricular dilatation. 2.  Fairly extensive inferior wall scar. 3.  No findings for ischemia. 4.  Global hypokinesis with estimated ejection fraction of 31%.   Original Report Authenticated By: Rudie Meyer, M.D.    Dg Chest Port 1 View  03/03/2012  *RADIOLOGY REPORT*  Clinical Data: 57 year old male chest pain and shortness of breath.  PORTABLE CHEST - 1 VIEW  Comparison: 03/01/2012 and earlier.  Findings: Portable semi upright AP view at 1316 hours.  Stable lung volumes. Stable cardiomegaly and mediastinal contours.  Small pleural effusions are less apparent.  Mildly decreased interstitial opacity.  No overt edema.  No pneumothorax or new pulmonary opacity. Stable chronic changes proximal right humerus.  IMPRESSION: Interval decreased small pleural effusions and vascular congestion/edema.   Original Report Authenticated By: Erskine Speed, M.D.     Scheduled Meds: . albuterol  2.5 mg Nebulization Q6H  . amLODipine  2.5 mg Oral Daily  . aspirin EC  325 mg Oral Daily  . Chlorhexidine Gluconate Cloth  6 each Topical Q0600  . enoxaparin (LOVENOX) injection  40 mg Subcutaneous Q24H  . furosemide  40 mg Intravenous BID  . insulin aspart  0-9 Units Subcutaneous Q4H  . levofloxacin  750 mg  Intravenous Q24H  . lisinopril  10 mg Oral Daily  . methylPREDNISolone (SOLU-MEDROL) injection  125 mg Intravenous Once  . mupirocin ointment  1 application Nasal BID  . predniSONE  40 mg Oral Q breakfast  . sodium chloride  3 mL Intravenous Q12H   Continuous Infusions:   Principal Problem:   Hypoxemia Active Problems:   COPD with acute exacerbation   Chest pain   Schizoaffective disorder   Mental retardation, mild (I.Q. 50-70)   Hypothyroidism   HTN (hypertension)   Acute on chronic diastolic congestive heart failure   DM w/o complication type II    Time spent: 30 min    Eliceo Gladu  [facility] Pager [phone]. If 7PM-7AM, please contact night-coverage at [email or web address], password [facility] 03/04/2012, 4:51 PM  LOS: 3 days

## 2012-03-04 NOTE — Progress Notes (Addendum)
[facility]  Subjective: 6/10 CP.  SOB  Objective: Vital signs in last 24 hours: Temp:  [97.5 F (36.4 C)-98.7 F (37.1 C)] 98.6 F (37 C) (03/04 0455) Pulse Rate:  [51-71] 51 (03/04 0900) Resp:  [14-24] 24 (03/04 0900) BP: (120-139)/(78-90) 139/84 mmHg (03/04 0900) SpO2:  [91 %-99 %] 92 % (03/04 0900) Weight:  [70.761 kg (156 lb)] 70.761 kg (156 lb) (03/04 0455) Last BM Date: 03/03/12  Intake/Output from previous day: 03/03 0701 - 03/04 0700 In: 600 [P.O.:600] Out: 4275 [Urine:4275] Intake/Output this shift:    Medications Current Facility-Administered Medications  Medication Dose Route Frequency Provider Last Rate Last Dose  . acetaminophen (TYLENOL) tablet 650 mg  650 mg Oral Q6H PRN Ron Parker, MD       Or  . acetaminophen (TYLENOL) suppository 650 mg  650 mg Rectal Q6H PRN Ron Parker, MD      . acetaminophen (TYLENOL) tablet 650 mg  650 mg Oral Q4H PRN Harvette Velora Heckler, MD      . albuterol (PROVENTIL) (5 MG/ML) 0.5% nebulizer solution 2.5 mg  2.5 mg Nebulization Q2H PRN Ron Parker, MD      . albuterol (PROVENTIL) (5 MG/ML) 0.5% nebulizer solution 2.5 mg  2.5 mg Nebulization Q6H Renae Fickle, MD   2.5 mg at 03/04/12 0739  . alum & mag hydroxide-simeth (MAALOX/MYLANTA) 200-200-20 MG/5ML suspension 30 mL  30 mL Oral Q6H PRN Ron Parker, MD      . amLODipine (NORVASC) tablet 2.5 mg  2.5 mg Oral Daily Renae Fickle, MD   2.5 mg at 03/03/12 1026  . aspirin EC tablet 325 mg  325 mg Oral Daily Ron Parker, MD   325 mg at 03/03/12 1026  . Chlorhexidine Gluconate Cloth 2 % PADS 6 each  6 each Topical Q0600 Renae Fickle, MD   6 each at 03/04/12 [phone]  . enoxaparin (LOVENOX) injection 40 mg  40 mg Subcutaneous Q24H Ron Parker, MD   40 mg at 03/02/12 2232  . furosemide (LASIX) injection 40 mg  40 mg Intravenous BID Renae Fickle, MD   40 mg at 03/03/12 1749  . hydrALAZINE (APRESOLINE) injection 10  mg  10 mg Intravenous Q4H PRN Renae Fickle, MD      . HYDROmorphone (DILAUDID) injection 0.5-1 mg  0.5-1 mg Intravenous Q3H PRN Ron Parker, MD   1 mg at 03/02/12 2116  . insulin aspart (novoLOG) injection 0-9 Units  0-9 Units Subcutaneous Q4H Ron Parker, MD   1 Units at 03/04/12 0426  . levofloxacin (LEVAQUIN) IVPB 750 mg  750 mg Intravenous Q24H Ethelda Chick, MD 100 mL/hr at 03/03/12 1750 750 mg at 03/03/12 1750  . lisinopril (PRINIVIL,ZESTRIL) tablet 10 mg  10 mg Oral Daily Ron Parker, MD   10 mg at 03/03/12 1026  . methylPREDNISolone sodium succinate (SOLU-MEDROL) 125 mg/2 mL injection 125 mg  125 mg Intravenous Once Ron Parker, MD      . mupirocin ointment (BACTROBAN) 2 % 1 application  1 application Nasal BID Renae Fickle, MD   1 application at 03/03/12 2147  . ondansetron (ZOFRAN) tablet 4 mg  4 mg Oral Q6H PRN Ron Parker, MD       Or  . ondansetron (ZOFRAN) injection 4 mg  4 mg Intravenous Q6H PRN Ron Parker, MD      . ondansetron (ZOFRAN) injection 4 mg  4 mg Intravenous Q6H PRN Harvette C  Lovell Sheehan, MD      . oxyCODONE (Oxy IR/ROXICODONE) immediate release tablet 5 mg  5 mg Oral Q4H PRN Ron Parker, MD   5 mg at 03/04/12 0253  . predniSONE (DELTASONE) tablet 40 mg  40 mg Oral Q breakfast Renae Fickle, MD   40 mg at 03/03/12 0900  . sodium chloride 0.9 % injection 3 mL  3 mL Intravenous Q12H Harvette Velora Heckler, MD   3 mL at 03/02/12 0000  . zolpidem (AMBIEN) tablet 5 mg  5 mg Oral QHS PRN Ron Parker, MD   5 mg at 03/04/12 0028   Facility-Administered Medications Ordered in Other Encounters  Medication Dose Route Frequency Provider Last Rate Last Dose  . regadenoson (LEXISCAN) injection SOLN 0.4 mg  0.4 mg Intravenous Once Chrystie Nose, MD        PE: General appearance: alert, cooperative and no distress Lungs: clear to auscultation bilaterally Heart: regular rate and rhythm, S1, S2 normal, no murmur, click,  rub or gallop Extremities: No LEE Pulses: 2+ and symmetric 1+ DPs Skin: Warm and dry Neurologic: Grossly normal  Lab Results:   Recent Labs  03/01/12 1940 03/02/12 0456  WBC 5.8 3.2*  HGB 11.9* 12.1*  HCT 39.8 40.0  PLT 180 157   BMET  Recent Labs  03/02/12 0456 03/03/12 0438 03/04/12 0424  NA 137 141 138  K 4.5 4.2 3.8  CL 99 99 98  CO2 33* 36* 35*  GLUCOSE 125* 173* 97  BUN 18 27* 27*  CREATININE 1.07 1.13 1.12  CALCIUM 8.5 8.8 8.5   Cardiac Panel (last 3 results)  Recent Labs  03/02/12 1921 03/02/12 2133 03/03/12 0438  TROPONINI <0.30 <0.30 <0.30   Lipid Panel     Component Value Date/Time   CHOL 150 02/01/2011 2210   TRIG 61 02/01/2011 2210   HDL 47 02/01/2011 2210   CHOLHDL 3.2 02/01/2011 2210   VLDL 12 02/01/2011 2210   LDLCALC 91 02/01/2011 2210   Assessment/Plan    Principal Problem:   Hypoxemia Active Problems:   COPD with acute exacerbation   Chest pain   Schizoaffective disorder   Mental retardation, mild (I.Q. 50-70)   Hypothyroidism   HTN (hypertension)   Acute on chronic diastolic congestive heart failure   DM w/o complication type II  Plan:  The patient was seen in Nuc Med.  He tolerated the [employer] well.  He did report increasing SOB and CP.  Symptoms improve at conclusion of test.  No active wheezing.  O2 sats remained stable.  BP and HR stable.     LOS: 3 days    HAGER, BRYAN 03/04/2012 11:06 AM  I have seen and evaluated the patient this PM (while in Nuclear Medicine) along with Wilburt Finlay, PA. I agree with his findings, examination as well as impression recommendations.  Very difficult situation, unable to get a true history with his baseline state -- still complains of back & left upper chest pain.  It does appear to be somewhat reproducible & worse with cough & deep inspiration, making it most likely MSK related.  Per Dr. Rennis Golden - Eugenie Birks Myoview being done today to rule out ischemic etiology -- will be difficult  as patient motion may be an issue.  Will need to see High Risk ischemia to consider Cath +/- PCI.   Marykay Lex, M.D., M.S. [facility] [address] [place], [place]  [address]  [phone] Pager # [phone]  03/04/2012 12:34 PM  ADDENDUM:  Nuclear ST demonstrates low EF ~30% with what appears to be a prior inferior infarct - but no ischemia. This result would argue against ischemic chest pain, but is concerning for Ischemic CM.   Unfortunately, at this hour, we are unable to initiate any additional work-up.  -- Would order 2 D Echo to assess for potential valvular lesions (although no obvious murmur heard) in addition to regional wall motion abnormalities.    If the EF is confirmed indeed to be decreased (EF can often be underestimated by Nuclear imaging), we will need to have a discussion with his family re: potential for additional invasive evaluation & +/- LifeVest / ICD.  -- See Dr. Blanchie Dessert note for details.  We will continue to follow -- & give further recommendations tomorrow.  Marykay Lex, M.D., M.S. [facility] [address] [place], [place]  [address]  [phone] Pager # [phone] 03/04/2012 4:18 PM

## 2012-03-04 NOTE — Significant Event (Signed)
Rapid Response Event Note  Overview: Time Called: 2120 Arrival Time: 2125 Event Type: Neurologic  Initial Focused Assessment: Pt had three episodes of seizure activity prior to arrival and was nonverbal. Seizure activity was approximately 10-20 seconds. Pt gradually was following commands. Maren Reamer came to the room and patient had 10 sec episode of facial twitching was given Ativan 1mg  IV. Upon reviewing history pt was found to have had pseudoseizures in the past and seizures ruled out by neurologist. Pt woke and was verbal prior my leaving and following commands.    Interventions:Assessed and monitored. Received 1 mg ativan   Event Summary: Name of Physician Notified: Maren Reamer NP at 2130    at    Outcome: Stayed in room and stabalized  Event End Time: 2200  Bayard Hugger

## 2012-03-04 NOTE — Progress Notes (Signed)
Throughout the shift, patient has been getting in and out of bed even though he is a high fall risk. He has also been refusing meds, vital sign checks, and CBG checks frequently. Orders for a sitter and non-violent restraints were entered by Craige Cotta, NP, but there are no available sitters. Patient finally agreed to take an Ambien and stay in bed if he could have one last cup of coffee before he became NPO for his procedure in the AM. Bed alarm is on. So far patient has been resting comfortably. Restraints have not needed to be applied thus far. Will continue to monitor and will apply as needed and chart appropriately. Leeroy Cha

## 2012-03-05 ENCOUNTER — Other Ambulatory Visit: Payer: Self-pay

## 2012-03-05 LAB — GLUCOSE, CAPILLARY
Glucose-Capillary: 104 mg/dL — ABNORMAL HIGH (ref 70–99)
Glucose-Capillary: 112 mg/dL — ABNORMAL HIGH (ref 70–99)
Glucose-Capillary: 124 mg/dL — ABNORMAL HIGH (ref 70–99)
Glucose-Capillary: 117 mg/dL — ABNORMAL HIGH (ref 70–99)

## 2012-03-05 MED ORDER — RISPERIDONE 2 MG PO TABS
2.0000 mg | ORAL_TABLET | Freq: Every day | ORAL | Status: DC
  Administered 2012-03-05 – 2012-03-06 (×2): 2 mg via ORAL
  Filled 2012-03-05 (×4): qty 1

## 2012-03-05 MED ORDER — ASPIRIN 81 MG PO CHEW
324.0000 mg | CHEWABLE_TABLET | ORAL | Status: AC
  Administered 2012-03-06: 324 mg via ORAL
  Filled 2012-03-05: qty 4

## 2012-03-05 MED ORDER — TAMSULOSIN HCL 0.4 MG PO CAPS
0.4000 mg | ORAL_CAPSULE | Freq: Every day | ORAL | Status: DC
  Administered 2012-03-05 – 2012-03-06 (×2): 0.4 mg via ORAL
  Filled 2012-03-05 (×3): qty 1

## 2012-03-05 MED ORDER — LITHIUM CARBONATE 300 MG PO CAPS
300.0000 mg | ORAL_CAPSULE | Freq: Three times a day (TID) | ORAL | Status: DC
  Administered 2012-03-05 – 2012-03-07 (×6): 300 mg via ORAL
  Filled 2012-03-05 (×9): qty 1

## 2012-03-05 MED ORDER — FUROSEMIDE 40 MG PO TABS
40.0000 mg | ORAL_TABLET | Freq: Two times a day (BID) | ORAL | Status: DC
  Administered 2012-03-06: 40 mg via ORAL
  Filled 2012-03-05 (×2): qty 1

## 2012-03-05 MED ORDER — RISPERIDONE 1 MG PO TABS
1.0000 mg | ORAL_TABLET | Freq: Every morning | ORAL | Status: DC
  Administered 2012-03-05 – 2012-03-07 (×3): 1 mg via ORAL
  Filled 2012-03-05 (×3): qty 1

## 2012-03-05 MED ORDER — HYDROMORPHONE HCL PF 1 MG/ML IJ SOLN: 0.5000 mg | INTRAMUSCULAR | Status: DC | PRN

## 2012-03-05 MED ORDER — RISPERIDONE 1 MG PO TABS: 1.0000 mg | ORAL_TABLET | Freq: Every morning | ORAL | Status: DC

## 2012-03-05 MED ORDER — ENOXAPARIN SODIUM 40 MG/0.4ML ~~LOC~~ SOLN
40.0000 mg | SUBCUTANEOUS | Status: DC
  Administered 2012-03-07: 40 mg via SUBCUTANEOUS
  Filled 2012-03-05: qty 0.4

## 2012-03-05 MED ORDER — FUROSEMIDE 40 MG PO TABS
40.0000 mg | ORAL_TABLET | Freq: Two times a day (BID) | ORAL | Status: DC
  Filled 2012-03-05 (×2): qty 1

## 2012-03-05 MED ORDER — SODIUM CHLORIDE 0.9 % IV SOLN
1.0000 mL/kg/h | INTRAVENOUS | Status: DC
  Administered 2012-03-05: 1 mL/kg/h via INTRAVENOUS

## 2012-03-05 MED ORDER — DIAZEPAM 5 MG PO TABS: 5.0000 mg | ORAL_TABLET | ORAL | Status: AC

## 2012-03-05 MED ORDER — LORAZEPAM 2 MG/ML IJ SOLN
0.5000 mg | INTRAMUSCULAR | Status: DC | PRN
  Administered 2012-03-06 (×2): 0.5 mg via INTRAVENOUS
  Filled 2012-03-05 (×3): qty 1

## 2012-03-05 MED ORDER — LISINOPRIL 10 MG PO TABS: 10.0000 mg | ORAL_TABLET | Freq: Every day | ORAL | Status: DC

## 2012-03-05 NOTE — Progress Notes (Signed)
  Echocardiogram 2D Echocardiogram has been performed.  MORFORD, MELISSA 03/05/2012, 4:06 PM

## 2012-03-05 NOTE — Progress Notes (Signed)
Reassessed pt . Pt denies chest pain at this time. Pt states "I hurt in my arm, my arm"  Report give to night RN.

## 2012-03-05 NOTE — Progress Notes (Signed)
Pt c/o chest pain in left upper chest without radiation. VSS. EKG done. Dr. Benjamine Mola text paged. Awaiting return call. Will continue to monitor.

## 2012-03-05 NOTE — Progress Notes (Signed)
[facility]  DAILY PROGRESS NOTE   Subjective:  Still complaining of chest pain - hard to know if this is real or not. Nuclear stress test showed markedly dilated LV with what appeared to be inferior scar - EF 31%.    Objective:  Temp:  [97.4 F (36.3 C)-98.4 F (36.9 C)] 97.5 F (36.4 C) (03/05 1326) Pulse Rate:  [55-77] 65 (03/05 1326) Resp:  [20] 20 (03/05 1326) BP: (99-147)/(61-83) 100/61 mmHg (03/05 1326) SpO2:  [93 %-99 %] 93 % (03/05 1326) Weight:  [69.4 kg (153 lb)] 69.4 kg (153 lb) (03/05 0620) Weight change: -1.361 kg (-3 lb)  Intake/Output from previous day: 03/04 0701 - 03/05 0700 In: 3440 [P.O.:2600; IV Piggyback:840] Out: 4500 [Urine:4500]  Intake/Output from this shift: Total I/O In: 900 [P.O.:900] Out: 2600 [Urine:2600]  Medications: Current Facility-Administered Medications  Medication Dose Route Frequency Provider Last Rate Last Dose  . acetaminophen (TYLENOL) tablet 650 mg  650 mg Oral Q6H PRN Ron Parker, MD       Or  . acetaminophen (TYLENOL) suppository 650 mg  650 mg Rectal Q6H PRN Ron Parker, MD      . acetaminophen (TYLENOL) tablet 650 mg  650 mg Oral Q4H PRN Harvette Velora Heckler, MD      . albuterol (PROVENTIL) (5 MG/ML) 0.5% nebulizer solution 2.5 mg  2.5 mg Nebulization Q2H PRN Ron Parker, MD      . albuterol (PROVENTIL) (5 MG/ML) 0.5% nebulizer solution 2.5 mg  2.5 mg Nebulization Q6H Renae Fickle, MD   2.5 mg at 03/05/12 0750  . alum & mag hydroxide-simeth (MAALOX/MYLANTA) 200-200-20 MG/5ML suspension 30 mL  30 mL Oral Q6H PRN Ron Parker, MD      . amLODipine (NORVASC) tablet 2.5 mg  2.5 mg Oral Daily Renae Fickle, MD   2.5 mg at 03/05/12 1054  . aspirin EC tablet 325 mg  325 mg Oral Daily Ron Parker, MD   325 mg at 03/05/12 1054  . Chlorhexidine Gluconate Cloth 2 % PADS 6 each  6 each Topical Q0600 Renae Fickle, MD   6 each at 03/05/12 [phone]  . enoxaparin (LOVENOX)  injection 40 mg  40 mg Subcutaneous Q24H Ron Parker, MD   40 mg at 03/04/12 2340  . furosemide (LASIX) tablet 40 mg  40 mg Oral BID Joseph Art, DO      . hydrALAZINE (APRESOLINE) injection 10 mg  10 mg Intravenous Q4H PRN Renae Fickle, MD      . HYDROmorphone (DILAUDID) injection 0.5 mg  0.5 mg Intravenous Q3H PRN Joseph Art, DO      . insulin aspart (novoLOG) injection 0-9 Units  0-9 Units Subcutaneous Q4H Ron Parker, MD   1 Units at 03/04/12 2055  . levofloxacin (LEVAQUIN) IVPB 750 mg  750 mg Intravenous Q24H Joseph Art, DO 100 mL/hr at 03/04/12 1702 750 mg at 03/04/12 1702  . lisinopril (PRINIVIL,ZESTRIL) tablet 10 mg  10 mg Oral Daily Ron Parker, MD   10 mg at 03/05/12 1054  . lithium carbonate capsule 300 mg  300 mg Oral TID WC Joseph Art, DO      . mupirocin ointment (BACTROBAN) 2 % 1 application  1 application Nasal BID Renae Fickle, MD   1 application at 03/05/12 1056  . ondansetron (ZOFRAN) tablet 4 mg  4 mg Oral Q6H PRN Ron Parker, MD       Or  .  ondansetron (ZOFRAN) injection 4 mg  4 mg Intravenous Q6H PRN Ron Parker, MD      . ondansetron (ZOFRAN) injection 4 mg  4 mg Intravenous Q6H PRN Ron Parker, MD      . oxyCODONE (Oxy IR/ROXICODONE) immediate release tablet 5 mg  5 mg Oral Q4H PRN Ron Parker, MD   5 mg at 03/04/12 0253  . predniSONE (DELTASONE) tablet 30 mg  30 mg Oral Q breakfast Renae Fickle, MD   30 mg at 03/05/12 0816  . [START ON 03/06/2012] risperiDONE (RISPERDAL) tablet 1 mg  1 mg Oral q morning - 10a Jessica U Vann, DO      . risperiDONE (RISPERDAL) tablet 2 mg  2 mg Oral Q2000 Jessica U Vann, DO      . sodium chloride 0.9 % injection 3 mL  3 mL Intravenous Q12H Ron Parker, MD   3 mL at 03/05/12 1054  . tamsulosin (FLOMAX) capsule 0.4 mg  0.4 mg Oral QPC supper Jessica U Vann, DO      . zolpidem (AMBIEN) tablet 5 mg  5 mg Oral QHS PRN Ron Parker, MD   5 mg at 03/04/12 0028     Physical Exam: General appearance: alert and slowed mentation Neck: JVD - 2 cm above sternal notch, no adenopathy, no carotid bruit, supple, symmetrical, trachea midline and thyroid not enlarged, symmetric, no tenderness/mass/nodules Lungs: diminished breath sounds bilaterally Heart: regular rate and rhythm Abdomen: soft, non-tender; bowel sounds normal; no masses,  no organomegaly Extremities: extremities normal, atraumatic, no cyanosis or edema Pulses: 2+ and symmetric Skin: Skin color, texture, turgor normal. No rashes or lesions Neurologic: Mental status: Severe mental retardation  Lab Results: Results for orders placed during the hospital encounter of 03/01/12 (from the past 48 hour(s))  GLUCOSE, CAPILLARY     Status: Abnormal   Collection Time    03/03/12  4:24 PM      Result Value Range   Glucose-Capillary 119 (*) 70 - 99 mg/dL  GLUCOSE, CAPILLARY     Status: Abnormal   Collection Time    03/03/12  8:19 PM      Result Value Range   Glucose-Capillary 107 (*) 70 - 99 mg/dL   Comment 1 Documented in Chart     Comment 2 Notify RN    GLUCOSE, CAPILLARY     Status: Abnormal   Collection Time    03/04/12 12:29 AM      Result Value Range   Glucose-Capillary 119 (*) 70 - 99 mg/dL   Comment 1 Documented in Chart     Comment 2 Notify RN    GLUCOSE, CAPILLARY     Status: Abnormal   Collection Time    03/04/12  4:21 AM      Result Value Range   Glucose-Capillary 121 (*) 70 - 99 mg/dL  BASIC METABOLIC PANEL     Status: Abnormal   Collection Time    03/04/12  4:24 AM      Result Value Range   Sodium 138  135 - 145 mEq/L   Potassium 3.8  3.5 - 5.1 mEq/L   Chloride 98  96 - 112 mEq/L   CO2 35 (*) 19 - 32 mEq/L   Glucose, Bld 97  70 - 99 mg/dL   BUN 27 (*) 6 - 23 mg/dL   Creatinine, Ser 5.62  0.50 - 1.35 mg/dL   Calcium 8.5  8.4 - 13.0 mg/dL   GFR calc non Af Denyse Dago  71 (*) >90 mL/min   GFR calc Af Amer 82 (*) >90 mL/min   Comment:            The eGFR has been calculated      using the CKD EPI equation.     This calculation has not been     validated in all clinical     situations.     eGFR's persistently     <90 mL/min signify     possible Chronic Kidney Disease.  GLUCOSE, CAPILLARY     Status: None   Collection Time    03/04/12  7:46 AM      Result Value Range   Glucose-Capillary 94  70 - 99 mg/dL    Imaging: Nm Myocar Multi W/spect W/wall Motion / Ef  03/04/2012  *RADIOLOGY REPORT*  Clinical Data:  Chest pain.  MYOCARDIAL IMAGING WITH SPECT (REST AND PHARMACOLOGIC-STRESS) GATED LEFT VENTRICULAR WALL MOTION STUDY LEFT VENTRICULAR EJECTION FRACTION  Technique:  Standard myocardial SPECT imaging was performed after resting intravenous injection of 10 mCi Tc-75m sestamibi. Subsequently, intravenous infusion of Lexiscan was performed under the supervision of the Cardiology staff.  At peak effect of the drug, 30 mCi Tc-31m sestamibi was injected intravenously and standard myocardial SPECT  imaging was performed.  Quantitative gated imaging was also performed to evaluate left ventricular wall motion, and estimate left ventricular ejection fraction.  Comparison:  None  Findings: The left ventricle is dilated.  There is an inferior scar.  Global hypokinesis noted on gated SPECT images with estimated ejection fraction at 31%.  No reversible defects to suggest ischemia.  IMPRESSION:  1.  Left ventricular dilatation. 2.  Fairly extensive inferior wall scar. 3.  No findings for ischemia. 4.  Global hypokinesis with estimated ejection fraction of 31%.   Original Report Authenticated By: Rudie Meyer, M.D.     Assessment:  1. Principal Problem: 2.   Hypoxemia 3. Active Problems: 4.   COPD with acute exacerbation 5.   Chest pain 6.   Schizoaffective disorder 7.   Mental retardation, mild (I.Q. 50-70) 8.   Hypothyroidism 9.   HTN (hypertension) 10.   Acute on chronic diastolic congestive heart failure 11.   DM w/o complication type II 12.   Plan:  1. I had a  long discussion on the phone today with Mrs. Doyce Para (his 1st cousin and HC-POA) @ [phone].  I explained that continues to complain of chest pain and had a documented 5 second sinus pause. His nuclear stress test showed a dilated ventricle with global hypokinesis and fixed inferior defect interpreted as scar.  EF was 31% - there is cardiomegaly on CXR and CT of the chest does show atherosclerotic disease. He has also been a heavy (4 pack/day) smoker since age 37, according to her.  Given multiple risk factors for CAD and pain complaints, I feel he needs a heart catheterization.  Unfortunately, he does not have the capacity, in my professional opinion, to fully comprehend the risks/benefits of the procedure.  I discussed these in detail, including risk of stroke, bleeding, arrythmia, contrast dye nephropathy/allergy, coronary dissection (if angioplasty is performed) and vascular complications. She is aware of the risks and consents for him to proceed. I had Dr. Danie Binder (who overheard the conversation) attest as a witness Mrs. Overman's phone consent - unfortunately, she is elderly and her husband just had surgery, so she cannot come in now to consent.  She would like to be updated on the findings at  cath tomorrow.  Please keep NPO after MN for [facility] tomorrow.  Time Spent Directly with Patient:  30 minutes  Length of Stay:  LOS: 4 days   Chrystie Nose, MD, [facility] Attending Cardiologist [facility]  HILTY,Kenneth C 03/05/2012, 2:29 PM   -

## 2012-03-05 NOTE — Progress Notes (Signed)
[facility] PROGRESS NOTE  [name]:[id number] DOB: 1955-11-15 DOA: 03/01/2012 PCP: Henri Medal, MD  Assessment/Plan: Acute hypoxic respiratory failure. Initially thought to be due to acute diastolic CHF versus acute COPD exacerbation with CHF being the more likely of the two diagnoses in the setting of rales and elevated BNP. Cardiology was consulted due to a >5second sinus pause on telemetry and stress test demonstrated diminished EF and evidence of irreversible ischemia.  Abnl stress test without evidence of reversible ischemia and possibly diminished EF (cardiac silhouette is enlarged on CXR)  - Appreciate cardiology assistance  -ECHO to evaluate EF  - ACEI  - Holding beta blocker due to recent pause   For acute diastolic (or systolic) CHF: - Daily weights and strict I/O  - Continue lasix  - Wean oxygen as tolerated   For acute COPD exac:  - Wean to prednisone 30mg  for quick taper  - Peanut allergy so no ipratropium  - Continue albuterol prn  - Levofloxacin day 5/5   Pause 5.2 sec on telemetry.. Bradycardic to the 40s, but no other significant pauses noted.  - Appreciate cardiology assistance  - Atropine prn  - holding BB  HTN- control BP in setting of diastolic heart failure. Mildly elevated  - Continue norvasc  - continue ACEI 2/2 diabetes.  - lasix as above  - Appreciate cardiology assistance  - Add PRN hydralazine for SBP > 180   Chest Pain, chronic. Multiple CTa chest in past negative. Patient often c/o chest pain to avoid being discharged. likely due to cough and wheezing  - Troponins neg x 5  - Stress test negative for acute ischemia   Hypothyroid, stable, TSH 1.7  - cont synthroid   Schizoaffective disorder, hx of pseudoseizure, and Mental retardation - unchanged and stable. Patient had "seizure like activity" overnight on 3/3>>3/4, however, after reviewing the records, the patient has had an extensive work up for seizure which was negative.  He frequently c/o chest pain and has pseudoseizures to prolong hospitalization. He asks every day to not be discharged.   DM2- FS well controlled  - continue SSi   Code Status: full Family Communication:  Disposition Plan: back to home 1-2 days depending on cardiology work up   Consultants:  cards  Procedures:  Stress test  Antibiotics:    HPI/Subjective: No new c/o  Objective: Filed Vitals:   03/05/12 0208 03/05/12 0620 03/05/12 0753 03/05/12 0942  BP: 131/77 147/83  99/66  Pulse: 77 74  65  Temp: 98.4 F (36.9 C) 97.5 F (36.4 C)  97.4 F (36.3 C)  TempSrc: Axillary Oral  Oral  Resp: 20 20  20   Height:      Weight:  69.4 kg (153 lb)    SpO2: 95% 99% 94% 93%    Intake/Output Summary (Last 24 hours) at 03/05/12 1009 Last data filed at 03/05/12 0809  Gross per 24 hour  Intake   3920 ml  Output   4500 ml  Net   -580 ml   Filed Weights   03/03/12 0500 03/04/12 0455 03/05/12 0620  Weight: 72.666 kg (160 lb 3.2 oz) 70.761 kg (156 lb) 69.4 kg (153 lb)    Exam:   General:  Pleasant/cooeprative  Cardiovascular: rrr  Respiratory: clear anterior  Abdomen: +BS, soft, NT  Musculoskeletal: moves all 4 ext   Data Reviewed: Basic Metabolic Panel:  Recent Labs Lab 03/01/12 1940 03/02/12 0456 03/03/12 0438 03/04/12 0424  NA 141 137 141 138  K 4.1  4.5 4.2 3.8  CL 103 99 99 98  CO2 32 33* 36* 35*  GLUCOSE 96 125* 173* 97  BUN 16 18 27* 27*  CREATININE 1.11 1.07 1.13 1.12  CALCIUM 8.6 8.5 8.8 8.5   Liver Function Tests: No results found for this basename: AST, ALT, ALKPHOS, BILITOT, PROT, ALBUMIN,  in the last 168 hours No results found for this basename: LIPASE, AMYLASE,  in the last 168 hours No results found for this basename: AMMONIA,  in the last 168 hours CBC:  Recent Labs Lab 03/01/12 1940 03/02/12 0456  WBC 5.8 3.2*  HGB 11.9* 12.1*  HCT 39.8 40.0  MCV 85.6 84.4  PLT 180 157   Cardiac Enzymes:  Recent Labs Lab 03/02/12 0810  03/02/12 1341 03/02/12 1921 03/02/12 2133 03/03/12 0438  TROPONINI <0.30 <0.30 <0.30 <0.30 <0.30   BNP (last 3 results)  Recent Labs  03/10/11 1253 08/31/11 1928 03/01/12 1940  PROBNP 52.5 827.6* 5660.0*   CBG:  Recent Labs Lab 03/03/12 1624 03/03/12 2019 03/04/12 0029 03/04/12 0421 03/04/12 0746  GLUCAP 119* 107* 119* 121* 94    Recent Results (from the past 240 hour(s))  MRSA PCR SCREENING     Status: Abnormal   Collection Time    03/02/12  4:23 AM      Result Value Range Status   MRSA by PCR POSITIVE (*) NEGATIVE Final   Comment:            The GeneXpert MRSA Assay (FDA     approved for NASAL specimens     only), is one component of a     comprehensive MRSA colonization     surveillance program. It is not     intended to diagnose MRSA     infection nor to guide or     monitor treatment for     MRSA infections.     RESULT CALLED TO, READ BACK BY AND VERIFIED WITH:     B.ROTH AT 1107 ON [id number] BY C.BONGEL     Studies: Nm Myocar Multi W/spect W/wall Motion / Ef  03/04/2012  *RADIOLOGY REPORT*  Clinical Data:  Chest pain.  MYOCARDIAL IMAGING WITH SPECT (REST AND PHARMACOLOGIC-STRESS) GATED LEFT VENTRICULAR WALL MOTION STUDY LEFT VENTRICULAR EJECTION FRACTION  Technique:  Standard myocardial SPECT imaging was performed after resting intravenous injection of 10 mCi Tc-47m sestamibi. Subsequently, intravenous infusion of Lexiscan was performed under the supervision of the Cardiology staff.  At peak effect of the drug, 30 mCi Tc-72m sestamibi was injected intravenously and standard myocardial SPECT  imaging was performed.  Quantitative gated imaging was also performed to evaluate left ventricular wall motion, and estimate left ventricular ejection fraction.  Comparison:  None  Findings: The left ventricle is dilated.  There is an inferior scar.  Global hypokinesis noted on gated SPECT images with estimated ejection fraction at 31%.  No reversible defects to suggest  ischemia.  IMPRESSION:  1.  Left ventricular dilatation. 2.  Fairly extensive inferior wall scar. 3.  No findings for ischemia. 4.  Global hypokinesis with estimated ejection fraction of 31%.   Original Report Authenticated By: Rudie Meyer, M.D.    Dg Chest Port 1 View  03/03/2012  *RADIOLOGY REPORT*  Clinical Data: 57 year old male chest pain and shortness of breath.  PORTABLE CHEST - 1 VIEW  Comparison: 03/01/2012 and earlier.  Findings: Portable semi upright AP view at 1316 hours.  Stable lung volumes. Stable cardiomegaly and mediastinal contours.  Small pleural effusions are less apparent.  Mildly decreased interstitial opacity.  No overt edema.  No pneumothorax or new pulmonary opacity. Stable chronic changes proximal right humerus.  IMPRESSION: Interval decreased small pleural effusions and vascular congestion/edema.   Original Report Authenticated By: Erskine Speed, M.D.     Scheduled Meds: . albuterol  2.5 mg Nebulization Q6H  . amLODipine  2.5 mg Oral Daily  . aspirin EC  325 mg Oral Daily  . Chlorhexidine Gluconate Cloth  6 each Topical Q0600  . enoxaparin (LOVENOX) injection  40 mg Subcutaneous Q24H  . furosemide  40 mg Intravenous BID  . insulin aspart  0-9 Units Subcutaneous Q4H  . levofloxacin  750 mg Intravenous Q24H  . lisinopril  10 mg Oral Daily  . mupirocin ointment  1 application Nasal BID  . predniSONE  30 mg Oral Q breakfast  . sodium chloride  3 mL Intravenous Q12H   Continuous Infusions:   Principal Problem:   Hypoxemia Active Problems:   COPD with acute exacerbation   Chest pain   Schizoaffective disorder   Mental retardation, mild (I.Q. 50-70)   Hypothyroidism   HTN (hypertension)   Acute on chronic diastolic congestive heart failure   DM w/o complication type II    Time spent: 35    [facility], JESSICA  [facility] Pager [phone]. If 7PM-7AM, please contact night-coverage at [email or web address], password [facility] 03/05/2012, 10:09 AM  LOS: 4 days

## 2012-03-05 NOTE — Plan of Care (Signed)
Problem: Phase II Progression Outcomes Goal: Walk in hall or up in chair TID Outcome: Completed/Met Date Met:  03/05/12 Up in the room with one person assist

## 2012-03-05 NOTE — Plan of Care (Signed)
Problem: Phase I Progression Outcomes Goal: Voiding-avoid urinary catheter unless indicated Outcome: Progressing Wears condom cath

## 2012-03-05 NOTE — Plan of Care (Signed)
Problem: Phase I Progression Outcomes Goal: EF % per last Echo/documented,Core Reminder form on chart Outcome: Completed/Met Date Met:  03/05/12 See stress test results.

## 2012-03-06 ENCOUNTER — Encounter (HOSPITAL_COMMUNITY)
Admission: EM | Disposition: A | Discharge status: Home / Self Care | Payer: Self-pay | Source: Home / Self Care | Attending: Internal Medicine

## 2012-03-06 ENCOUNTER — Ambulatory Visit (HOSPITAL_COMMUNITY): Admission: RE | Admit: 2012-03-06 | Payer: Medicare Other | Source: Ambulatory Visit | Admitting: Cardiovascular Disease

## 2012-03-06 LAB — CBC
HCT: 41.6 % (ref 39.0–52.0)
Hemoglobin: 13.1 g/dL (ref 13.0–17.0)
MCH: 25.2 pg — ABNORMAL LOW (ref 26.0–34.0)
MCHC: 31.5 g/dL (ref 30.0–36.0)
MCV: 80 fL (ref 78.0–100.0)
Platelets: 213 10*3/uL (ref 150–400)
RBC: 5.2 MIL/uL (ref 4.22–5.81)
RDW: 14.7 % (ref 11.5–15.5)
WBC: 8.1 10*3/uL (ref 4.0–10.5)

## 2012-03-06 LAB — BASIC METABOLIC PANEL
BUN: 28 mg/dL — ABNORMAL HIGH (ref 6–23)
CO2: 30 mEq/L (ref 19–32)
Calcium: 8.8 mg/dL (ref 8.4–10.5)
Chloride: 98 mEq/L (ref 96–112)
Creatinine, Ser: 1.01 mg/dL (ref 0.50–1.35)
GFR calc Af Amer: >90 mL/min (ref >90)
GFR calc non Af Amer: 80 mL/min — ABNORMAL LOW (ref >90)
Glucose, Bld: 92 mg/dL (ref 70–99)
Potassium: 4 mEq/L (ref 3.5–5.1)
Sodium: 134 mEq/L — ABNORMAL LOW (ref 135–145)

## 2012-03-06 LAB — GLUCOSE, CAPILLARY
Glucose-Capillary: 94 mg/dL (ref 70–99)
Glucose-Capillary: 110 mg/dL — ABNORMAL HIGH (ref 70–99)

## 2012-03-06 LAB — PROTIME-INR: Prothrombin Time: 13.7 seconds (ref 11.6–15.2)

## 2012-03-06 SURGERY — LEFT HEART CATHETERIZATION WITH CORONARY ANGIOGRAM
Anesthesia: LOCAL

## 2012-03-06 MED ORDER — LORAZEPAM 2 MG/ML IJ SOLN
1.0000 mg | INTRAMUSCULAR | Status: DC | PRN
  Administered 2012-03-06: 2 mg via INTRAVENOUS
  Administered 2012-03-06: 1 mg via INTRAVENOUS
  Administered 2012-03-06: 2 mg via INTRAVENOUS
  Filled 2012-03-06 (×2): qty 1

## 2012-03-06 MED ORDER — AMLODIPINE BESYLATE 5 MG PO TABS
5.0000 mg | ORAL_TABLET | Freq: Every day | ORAL | Status: DC
  Administered 2012-03-07: 5 mg via ORAL
  Filled 2012-03-06: qty 1

## 2012-03-06 MED ORDER — LISINOPRIL 5 MG PO TABS
5.0000 mg | ORAL_TABLET | Freq: Every day | ORAL | Status: DC
  Administered 2012-03-07: 5 mg via ORAL
  Filled 2012-03-06: qty 1

## 2012-03-06 MED ORDER — ISOSORBIDE MONONITRATE ER 30 MG PO TB24
30.0000 mg | ORAL_TABLET | Freq: Every day | ORAL | Status: DC
  Administered 2012-03-06 – 2012-03-07 (×2): 30 mg via ORAL
  Filled 2012-03-06 (×2): qty 1

## 2012-03-06 MED ORDER — FUROSEMIDE 40 MG PO TABS
40.0000 mg | ORAL_TABLET | Freq: Every day | ORAL | Status: DC
  Administered 2012-03-07: 40 mg via ORAL
  Filled 2012-03-06: qty 1

## 2012-03-06 MED ORDER — ALBUTEROL SULFATE (5 MG/ML) 0.5% IN NEBU: 2.5000 mg | INHALATION_SOLUTION | Freq: Three times a day (TID) | RESPIRATORY_TRACT | Status: DC

## 2012-03-06 MED ORDER — DEXTROSE-NACL 5-0.9 % IV SOLN
INTRAVENOUS | Status: DC
  Administered 2012-03-06: 02:00:00 via INTRAVENOUS

## 2012-03-06 MED ORDER — PREDNISONE 20 MG PO TABS
20.0000 mg | ORAL_TABLET | Freq: Every day | ORAL | Status: DC
  Administered 2012-03-07: 20 mg via ORAL
  Filled 2012-03-06 (×2): qty 1

## 2012-03-06 NOTE — Progress Notes (Signed)
Called by pt's RN. She reports pt has been combative overnight. Both Security and [employer] have been involved. It seems unlikely that he would cooperate for cath and I have cancelled this for today. Will review with MD to see if we need to consider cath under general anesthesia as his underlying mental status issues are not likely to change.   Corine Shelter PA-C 03/06/2012 10:39 AM

## 2012-03-06 NOTE — Progress Notes (Signed)
Patient had restless night from 1:45 am to 4am.  Patient became combative for several hours, spit in employee eye and striking out at staff. Security and [employer] called, patient refused care at the start, finally after several hours of "wrestling" with patient and attempting to negiogiate, at 4:30 am patient settled down. We were able to prep groin, refused second IV start. Threatened to leave, Security  And [employer] stayed at patient door for about 1hr and 30 minutes. Sitter was supportive and much needed to assist with patient care. Patient was out of control. Paitent was also given Ativan about 3 am. Patient finally begin to sleep 4:30 to 5 am. Tama Gander

## 2012-03-06 NOTE — Progress Notes (Signed)
Pt offered popsicle and became extremely agitated yelling and becoming combative with staff. Security called. Pt  Placed in bed, MD paged, orders received. Long, Marissa A

## 2012-03-06 NOTE — Progress Notes (Addendum)
[facility] PROGRESS NOTE  [name] ZOX:[id number] DOB: 1955/02/27 DOA: 03/01/2012 PCP: Henri Medal, MD  Assessment/Plan: Acute hypoxic respiratory failure. Initially thought to be due to acute diastolic CHF versus acute COPD exacerbation with CHF being the more likely of the two diagnoses in the setting of rales and elevated BNP. Cardiology was consulted due to a >5second sinus pause on telemetry and stress test demonstrated diminished EF and evidence of irreversible ischemia.  Abnl stress test without evidence of reversible ischemia and possibly diminished EF (cardiac silhouette is enlarged on CXR)  - Appreciate cardiology assistance  -plan for cath today at [facility] - ACEI  - Holding beta blocker due to recent pause   For acute diastolic (or systolic) CHF: - Daily weights and strict I/O  - Continue lasix- change to PO - Wean oxygen as tolerated   For acute COPD exac:  - Wean to prednisone 30mg  for quick taper  - Peanut allergy so no ipratropium  - Continue albuterol prn  - Levofloxacin day 5/5   Pause 5.2 sec on telemetry.. Bradycardic to the 40s, but no other significant pauses noted.  - Appreciate cardiology assistance  - Atropine prn  - holding BB  HTN- control BP in setting of diastolic heart failure. Mildly elevated  - Continue norvasc  - continue ACEI 2/2 diabetes.  - lasix as above  - Appreciate cardiology assistance  - Add PRN hydralazine for SBP > 180   Chest Pain, chronic. Multiple CTa chest in past negative. Patient often c/o chest pain to avoid being discharged. likely due to cough and wheezing  - Troponins neg x 5  - Stress test negative for acute ischemia   Hypothyroid, stable, TSH 1.7  - cont synthroid   Schizoaffective disorder, hx of pseudoseizure, and Mental retardation - unchanged and stable. Patient had "seizure like activity" overnight on 3/3>>3/4, however, after reviewing the records, the patient has had an extensive work up for seizure  which was negative. He frequently c/o chest pain and has pseudoseizures to prolong hospitalization. He asks every day to not be discharged.   DM2- FS well controlled  - continue SSi  Combative requiring restraints currently- seems to be his baseline at the group home (have been asked to restrict caffeine as this worsens his behavior)  Code Status: full Family Communication:  Disposition Plan:   Consultants:  cards  Procedures:  Stress test  Antibiotics:    HPI/Subjective: Became combative last night- restarted home regimen yesterday Wanting coffee this AM (NPO for procedure)   Objective: Filed Vitals:   03/05/12 2050 03/06/12 0151 03/06/12 0154 03/06/12 0446  BP: 101/64  153/85 113/71  Pulse: 57  51 52  Temp: 97.6 F (36.4 C)  97.1 F (36.2 C) 97.4 F (36.3 C)  TempSrc: Oral  Oral Oral  Resp: 19  20 20   Height:      Weight:    71.4 kg (157 lb 6.5 oz)  SpO2: 95% 98% 99% 96%    Intake/Output Summary (Last 24 hours) at 03/06/12 1011 Last data filed at 03/06/12 0700  Gross per 24 hour  Intake 2030.04 ml  Output   6375 ml  Net -4344.96 ml   Filed Weights   03/05/12 0620 03/05/12 1527 03/06/12 0446  Weight: 69.4 kg (153 lb) 70.534 kg (155 lb 8 oz) 71.4 kg (157 lb 6.5 oz)    Exam:   General:  Pleasant/cooeprative  Cardiovascular: rrr  Respiratory: clear anterior  Abdomen: +BS, soft, NT  Musculoskeletal: moves all  4 ext   Data Reviewed: Basic Metabolic Panel:  Recent Labs Lab 03/01/12 1940 03/02/12 0456 03/03/12 0438 03/04/12 0424 03/06/12 0427  NA 141 137 141 138 134*  K 4.1 4.5 4.2 3.8 4.0  CL 103 99 99 98 98  CO2 32 33* 36* 35* 30  GLUCOSE 96 125* 173* 97 92  BUN 16 18 27* 27* 28*  CREATININE 1.11 1.07 1.13 1.12 1.01  CALCIUM 8.6 8.5 8.8 8.5 8.8   Liver Function Tests: No results found for this basename: AST, ALT, ALKPHOS, BILITOT, PROT, ALBUMIN,  in the last 168 hours No results found for this basename: LIPASE, AMYLASE,  in the  last 168 hours No results found for this basename: AMMONIA,  in the last 168 hours CBC:  Recent Labs Lab 03/01/12 1940 03/02/12 0456 03/06/12 0427  WBC 5.8 3.2* 8.1  HGB 11.9* 12.1* 13.1  HCT 39.8 40.0 41.6  MCV 85.6 84.4 80.0  PLT 180 157 213   Cardiac Enzymes:  Recent Labs Lab 03/02/12 0810 03/02/12 1341 03/02/12 1921 03/02/12 2133 03/03/12 0438  TROPONINI <0.30 <0.30 <0.30 <0.30 <0.30   BNP (last 3 results)  Recent Labs  03/10/11 1253 08/31/11 1928 03/01/12 1940  PROBNP 52.5 827.6* 5660.0*   CBG:  Recent Labs Lab 03/05/12 1950 03/06/12 0018 03/06/12 0207 03/06/12 0405 03/06/12 0757  GLUCAP 117* 87 138* 100* 89    Recent Results (from the past 240 hour(s))  MRSA PCR SCREENING     Status: Abnormal   Collection Time    03/02/12  4:23 AM      Result Value Range Status   MRSA by PCR POSITIVE (*) NEGATIVE Final   Comment:            The GeneXpert MRSA Assay (FDA     approved for NASAL specimens     only), is one component of a     comprehensive MRSA colonization     surveillance program. It is not     intended to diagnose MRSA     infection nor to guide or     monitor treatment for     MRSA infections.     RESULT CALLED TO, READ BACK BY AND VERIFIED WITH:     B.ROTH AT 1107 ON [id number] BY C.BONGEL     Studies: Nm Myocar Multi W/spect W/wall Motion / Ef  03/04/2012  *RADIOLOGY REPORT*  Clinical Data:  Chest pain.  MYOCARDIAL IMAGING WITH SPECT (REST AND PHARMACOLOGIC-STRESS) GATED LEFT VENTRICULAR WALL MOTION STUDY LEFT VENTRICULAR EJECTION FRACTION  Technique:  Standard myocardial SPECT imaging was performed after resting intravenous injection of 10 mCi Tc-30m sestamibi. Subsequently, intravenous infusion of Lexiscan was performed under the supervision of the Cardiology staff.  At peak effect of the drug, 30 mCi Tc-41m sestamibi was injected intravenously and standard myocardial SPECT  imaging was performed.  Quantitative gated imaging was also  performed to evaluate left ventricular wall motion, and estimate left ventricular ejection fraction.  Comparison:  None  Findings: The left ventricle is dilated.  There is an inferior scar.  Global hypokinesis noted on gated SPECT images with estimated ejection fraction at 31%.  No reversible defects to suggest ischemia.  IMPRESSION:  1.  Left ventricular dilatation. 2.  Fairly extensive inferior wall scar. 3.  No findings for ischemia. 4.  Global hypokinesis with estimated ejection fraction of 31%.   Original Report Authenticated By: Rudie Meyer, M.D.     Scheduled Meds: . amLODipine  2.5 mg Oral Daily  .  aspirin EC  325 mg Oral Daily  . Chlorhexidine Gluconate Cloth  6 each Topical Q0600  . diazepam  5 mg Oral On Call  . [START ON 03/07/2012] enoxaparin (LOVENOX) injection  40 mg Subcutaneous Q24H  . furosemide  40 mg Oral BID  . insulin aspart  0-9 Units Subcutaneous Q4H  . [START ON 03/07/2012] lisinopril  10 mg Oral Daily  . lithium carbonate  300 mg Oral TID WC  . mupirocin ointment  1 application Nasal BID  . predniSONE  30 mg Oral Q breakfast  . risperiDONE  1 mg Oral q morning - 10a  . risperiDONE  2 mg Oral Q2000  . sodium chloride  3 mL Intravenous Q12H  . tamsulosin  0.4 mg Oral QPC supper   Continuous Infusions: . dextrose 5 % and 0.9% NaCl 69.4 mL/hr at 03/06/12 0147    Principal Problem:   Hypoxemia Active Problems:   COPD with acute exacerbation   Chest pain   Schizoaffective disorder   Mental retardation, mild (I.Q. 50-70)   Hypothyroidism   HTN (hypertension)   Acute on chronic diastolic congestive heart failure   DM w/o complication type II    Time spent: 35    [facility], JESSICA  [facility] Pager [phone]. If 7PM-7AM, please contact night-coverage at [email or web address], password [facility] 03/06/2012, 10:11 AM  LOS: 5 days

## 2012-03-06 NOTE — Progress Notes (Signed)
Patient screaming for coffee this am but unable to have any due to possibility of cardiac cath today. Patient removed all telemetry leads and gown. Spoke with Franky Macho, [facility] from cardiology and reported that patient was very combative at this time and required a dose of ativan and restraints to keep safe. Patient was trying to spit and kick staff. Security was called to help assist with restraints. Restraints safely applied and patient resting at this time. Will continue to closely monitor patient. Setzer, Don Broach

## 2012-03-06 NOTE — Progress Notes (Addendum)
[facility] Progress Note  Subjective:  No chest pain presently. Events of last night and today noted; pt became markedly agitated requiring security and restraints. Cath cancelled.  Objective:   Vital Signs in the last 24 hours: Temp:  [97.1 F (36.2 C)-97.6 F (36.4 C)] 97.4 F (36.3 C) (03/06 1326) Pulse Rate:  [51-66] 56 (03/06 1326) Resp:  [18-20] 18 (03/06 1326) BP: (101-153)/(64-88) 140/88 mmHg (03/06 1613) SpO2:  [93 %-99 %] 98 % (03/06 1326) Weight:  [71.4 kg (157 lb 6.5 oz)] 71.4 kg (157 lb 6.5 oz) (03/06 0446)  Intake/Output from previous day: 03/05 0701 - 03/06 0700 In: 2510 [P.O.:1995; I.V.:365; IV Piggyback:150] Out: 6375 [Urine:6375]  Scheduled: . amLODipine  2.5 mg Oral Daily  . aspirin EC  325 mg Oral Daily  . Chlorhexidine Gluconate Cloth  6 each Topical Q0600  . diazepam  5 mg Oral On Call  . [START ON 03/07/2012] enoxaparin (LOVENOX) injection  40 mg Subcutaneous Q24H  . furosemide  40 mg Oral BID  . insulin aspart  0-9 Units Subcutaneous Q4H  . [START ON 03/07/2012] lisinopril  10 mg Oral Daily  . lithium carbonate  300 mg Oral TID WC  . mupirocin ointment  1 application Nasal BID  . [START ON 03/07/2012] predniSONE  20 mg Oral Q breakfast  . risperiDONE  1 mg Oral q morning - 10a  . risperiDONE  2 mg Oral Q2000  . sodium chloride  3 mL Intravenous Q12H  . tamsulosin  0.4 mg Oral QPC supper     Physical Exam:   General appearance: alert and no distress Neck: no JVD and supple, symmetrical, trachea midline Lungs: decreased BS Heart: 1/6 sem Abdomen: soft, non-tender; bowel sounds normal; no masses,  no organomegaly Extremities: no edema, redness or tenderness in the calves or thighs   Rate: 70  Rhythm: normal sinus rhythm; however earlier while sleeping bradycardic to 40's   Lab Results:    Recent Labs  03/04/12 0424 03/06/12 0427  NA 138 134*  K 3.8 4.0  CL 98 98  CO2 35* 30  GLUCOSE 97 92  BUN 27* 28*   CREATININE 1.12 1.01   No results found for this basename: TROPONINI, CK, MB,  in the last 72 hours Hepatic Function Panel No results found for this basename: PROT, ALBUMIN, AST, ALT, ALKPHOS, BILITOT, BILIDIR, IBILI,  in the last 72 hours  Recent Labs  03/06/12 0625  INR 1.06    Lipid Panel     Component Value Date/Time   CHOL 150 02/01/2011 2210   TRIG 61 02/01/2011 2210   HDL 47 02/01/2011 2210   CHOLHDL 3.2 02/01/2011 2210   VLDL 12 02/01/2011 2210   LDLCALC 91 02/01/2011 2210     Imaging:  EchoStudy Conclusions  - Left ventricle: The cavity size was mildly dilated. There was moderate concentric hypertrophy. Systolic function was moderately to severely reduced. The estimated ejection fraction was in the range of 30% to 35%. Global hypokinesis with severe inferior hypokinesis to akinesis. Doppler parameters are consistent with abnormal left ventricular relaxation (grade 1 diastolic dysfunction). The E/e' ratio is ~10, suggesting borderline elevated LV filling pressure. - Mitral valve: Calcified annulus. Sclerotic leaflet tips. Trivial regurgitation. - Left atrium: The atrium was normal in size. - Right atrium: The atrium was mildly dilated. - Pericardium, extracardiac: A trivial pericardial effusion was identified posterior to the heart. Features were not consistent with tamponade physiology    Nuclear Cardiology IMPRESSION:  1. Left ventricular dilatation.  2. Fairly extensive inferior wall scar.  3. No findings for ischemia.  4. Global hypokinesis with estimated ejection fraction of 31%.   Assessment/Plan:   Principal Problem:   Hypoxemia Active Problems:   COPD with acute exacerbation   Chest pain   Schizoaffective disorder   Mental retardation, mild (I.Q. 50-70)   Hypothyroidism   HTN (hypertension)   Acute on chronic diastolic congestive heart failure   DM w/o complication type II   Pt required significant constraints secondary to marked  combativeness earlier with screaming and spitting. He mostly likely has CAD with inferior scar without ischemia. I am not certain that at present he would tolerate a cardiac cath unless general anesthesia is employed. Since his cardiac imaging suggests scar without ischemia consider emperic medical trial initially. Will increase amlodipine to 5 mg daily and start low dose ACE-I and low dose nitrate therapy. Will decrease lasix to daily from bid.   Lennette Bihari, MD, [facility] 03/06/2012, 5:02 PM

## 2012-03-06 NOTE — Progress Notes (Signed)
Patient was asleep and heart rate dropped down to 39.  After awaking patient, HR immediately increased to 82 NSR on monitor. MD is aware. Will continue to monitor patient. Setzer, Don Broach

## 2012-03-07 ENCOUNTER — Emergency Department (HOSPITAL_COMMUNITY)
Admission: EM | Admit: 2012-03-07 | Discharge: 2012-03-07 | Disposition: A | Discharge status: Home / Self Care | Payer: Medicare Other | Attending: Emergency Medicine | Admitting: Emergency Medicine

## 2012-03-07 DIAGNOSIS — I5030 Unspecified diastolic (congestive) heart failure: Secondary | ICD-10-CM | POA: Insufficient documentation

## 2012-03-07 DIAGNOSIS — J4489 Other specified chronic obstructive pulmonary disease: Secondary | ICD-10-CM | POA: Insufficient documentation

## 2012-03-07 DIAGNOSIS — IMO0002 Reserved for concepts with insufficient information to code with codable children: Secondary | ICD-10-CM | POA: Insufficient documentation

## 2012-03-07 DIAGNOSIS — J449 Chronic obstructive pulmonary disease, unspecified: Secondary | ICD-10-CM | POA: Insufficient documentation

## 2012-03-07 DIAGNOSIS — E119 Type 2 diabetes mellitus without complications: Secondary | ICD-10-CM | POA: Insufficient documentation

## 2012-03-07 DIAGNOSIS — Z8589 Personal history of malignant neoplasm of other organs and systems: Secondary | ICD-10-CM | POA: Insufficient documentation

## 2012-03-07 DIAGNOSIS — F209 Schizophrenia, unspecified: Secondary | ICD-10-CM | POA: Insufficient documentation

## 2012-03-07 DIAGNOSIS — I10 Essential (primary) hypertension: Secondary | ICD-10-CM | POA: Insufficient documentation

## 2012-03-07 DIAGNOSIS — Z7982 Long term (current) use of aspirin: Secondary | ICD-10-CM | POA: Insufficient documentation

## 2012-03-07 DIAGNOSIS — J9610 Chronic respiratory failure, unspecified whether with hypoxia or hypercapnia: Secondary | ICD-10-CM | POA: Insufficient documentation

## 2012-03-07 DIAGNOSIS — R451 Restlessness and agitation: Secondary | ICD-10-CM

## 2012-03-07 DIAGNOSIS — I2510 Atherosclerotic heart disease of native coronary artery without angina pectoris: Secondary | ICD-10-CM | POA: Insufficient documentation

## 2012-03-07 DIAGNOSIS — Z8669 Personal history of other diseases of the nervous system and sense organs: Secondary | ICD-10-CM | POA: Insufficient documentation

## 2012-03-07 DIAGNOSIS — Z79899 Other long term (current) drug therapy: Secondary | ICD-10-CM | POA: Insufficient documentation

## 2012-03-07 DIAGNOSIS — E079 Disorder of thyroid, unspecified: Secondary | ICD-10-CM | POA: Insufficient documentation

## 2012-03-07 DIAGNOSIS — F79 Unspecified intellectual disabilities: Secondary | ICD-10-CM | POA: Insufficient documentation

## 2012-03-07 DIAGNOSIS — F17200 Nicotine dependence, unspecified, uncomplicated: Secondary | ICD-10-CM | POA: Insufficient documentation

## 2012-03-07 LAB — GLUCOSE, CAPILLARY
Glucose-Capillary: 106 mg/dL — ABNORMAL HIGH (ref 70–99)
Glucose-Capillary: 130 mg/dL — ABNORMAL HIGH (ref 70–99)

## 2012-03-07 MED ORDER — ASPIRIN 325 MG PO TBEC: 325.0000 mg | DELAYED_RELEASE_TABLET | Freq: Every day | ORAL | Status: DC

## 2012-03-07 MED ORDER — LISINOPRIL 5 MG PO TABS: 5.0000 mg | ORAL_TABLET | Freq: Every day | ORAL | Status: DC

## 2012-03-07 MED ORDER — PREDNISONE 20 MG PO TABS: ORAL_TABLET | ORAL | Status: DC

## 2012-03-07 MED ORDER — ISOSORBIDE MONONITRATE ER 30 MG PO TB24: 30.0000 mg | ORAL_TABLET | Freq: Every day | ORAL | Status: DC

## 2012-03-07 MED ORDER — FUROSEMIDE 40 MG PO TABS: 40.0000 mg | ORAL_TABLET | Freq: Every day | ORAL | Status: DC

## 2012-03-07 MED ORDER — AMLODIPINE BESYLATE 5 MG PO TABS: 5.0000 mg | ORAL_TABLET | Freq: Every day | ORAL | Status: DC

## 2012-03-07 NOTE — Progress Notes (Signed)
Patient is set to discharge back to Group Home today. Patient & group home supervisor, Nonnie Done (ph#: [phone]) aware. [facility] called for transport. Discharge packet at nurses station.   Unice Bailey, LCSW [facility] Clinical Social Worker cell #: [phone]

## 2012-03-07 NOTE — Discharge Summary (Signed)
Physician Discharge Summary  [name] NWG:[id number] DOB: 01-Oct-1955 DOA: 03/01/2012  PCP: Phillip Medal, MD  Admit date: 03/01/2012 Discharge date: 03/07/2012  Time spent: 35 minutes  Recommendations for Outpatient Follow-up:  1. Back to group home  Discharge Diagnoses:  Principal Problem:   Hypoxemia Active Problems:   COPD with acute exacerbation   Chest pain   Schizoaffective disorder   Mental retardation, mild (I.Q. 50-70)   Hypothyroidism   HTN (hypertension)   Acute on chronic diastolic congestive heart failure   DM w/o complication type II   Discharge Condition: improved  Diet recommendation: carb modified  Filed Weights   03/05/12 1527 03/06/12 0446 03/07/12 0500  Weight: 70.534 kg (155 lb 8 oz) 71.4 kg (157 lb 6.5 oz) 67.767 kg (149 lb 6.4 oz)    History of present illness:  [name] is a 57 y.o. male resident of an area Group home with a history of COPD and Diastolic CHF who was brought to the ED due to worsening SOB, and wheezing. He had been found by his caretaker in the bathroom soiled and in the tub SOB and wheezing. He had had URI symptoms 1 week ago and was seen by his PCP. He was evaluated in the ED and found to have hypoxemia, with O2 saturations of 80% and was placed on supplemental Oxygen. He was found on Chest X-Ray to have Pulmonary Edema and small bilateral pleural effusions. His BNP was 5660. He was started on treatment for a CHF exacerbation and a COPD Flare. He was referred for medical admission.    Hospital Course:  Acute hypoxic respiratory failure. Initially thought to be due to acute diastolic CHF versus acute COPD exacerbation with CHF being the more likely of the two diagnoses in the setting of rales and elevated BNP. Cardiology was consulted due to a >5second sinus pause on telemetry and stress test demonstrated diminished EF and evidence of irreversible ischemia.  Abnl stress test without evidence of reversible ischemia and possibly  diminished EF (cardiac silhouette is enlarged on CXR)  - Appreciate cardiology assistance: He mostly likely has CAD with inferior scar without ischemia. I am not certain that at present he would tolerate a cardiac cath unless general anesthesia is employed. Since his cardiac imaging suggests scar without ischemia consider emperic medical trial initially. Will increase amlodipine to 5 mg daily and start low dose ACE-I and low dose nitrate therapy. Will decrease lasix to daily from bid. - ACEI  - Holding beta blocker due to recent pause   For acute diastolic (or systolic) CHF:  - Daily weights and strict I/O  - Continue lasix- change to PO and once daily - Wean oxygen off  For acute COPD exac:  - Wean to prednisone  for quick taper  - Peanut allergy so no ipratropium  - Continue albuterol prn  - Levofloxacin day 5/5   Pause 5.2 sec on telemetry.. Bradycardic to the 40s, but no other significant pauses noted.  - Appreciate cardiology assistance  - Atropine prn  - holding BB  HTN- control BP in setting of diastolic heart failure. Mildly elevated  - Continue norvasc  - continue ACEI 2/2 diabetes.  - lasix as above  - Appreciate cardiology assistance  - Add PRN hydralazine for SBP > 180   Chest Pain, chronic. Multiple CTa chest in past negative. Patient often c/o chest pain to avoid being discharged. likely due to cough and wheezing  - Troponins neg x 5  - Stress  test negative for acute ischemia   Hypothyroid, stable, TSH 1.7  - cont synthroid   Schizoaffective disorder, hx of pseudoseizure, and Mental retardation - unchanged and stable. Patient had "seizure like activity" overnight on 3/3>>3/4, however, after reviewing the records, the patient has had an extensive work up for seizure which was negative. He frequently c/o chest pain and has pseudoseizures to prolong hospitalization. He asks every day to not be discharged.   DM2- FS well controlled  - continue  SSi   Procedures: Echo:Study Conclusions  - Left ventricle: The cavity size was mildly dilated. There was moderate concentric hypertrophy. Systolic function was moderately to severely reduced. The estimated ejection fraction was in the range of 30% to 35%. Global hypokinesis with severe inferior hypokinesis to akinesis. Doppler parameters are consistent with abnormal left ventricular relaxation (grade 1 diastolic dysfunction). The E/e' ratio is ~10, suggesting borderline elevated LV filling pressure. - Mitral valve: Calcified annulus. Sclerotic leaflet tips. Trivial regurgitation. - Left atrium: The atrium was normal in size. - Right atrium: The atrium was mildly dilated. - Pericardium, extracardiac: A trivial pericardial effusion was identified posterior to the heart. Features were not consistent with tamponade physiology.    Consultations:  cardiology  Discharge Exam: Filed Vitals:   03/06/12 1613 03/06/12 1900 03/07/12 0500 03/07/12 0642  BP: 140/88 132/83  105/68  Pulse:  101  68  Temp:  98.1 F (36.7 C)  97.9 F (36.6 C)  TempSrc:  Axillary  Oral  Resp:  20  18  Height:      Weight:   67.767 kg (149 lb 6.4 oz)   SpO2:  93%  98%    General: agitated at times Cardiovascular: rrr Respiratory: clear anterior  Discharge Instructions     Medication List    ASK your doctor about these medications       albuterol 108 (90 BASE) MCG/ACT inhaler  Commonly known as:  PROVENTIL HFA;VENTOLIN HFA  Inhale 2 puffs into the lungs every 6 (six) hours as needed for wheezing.     amLODipine 10 MG tablet  Commonly known as:  NORVASC  Take 10 mg by mouth every morning.     bimatoprost 0.01 % Soln  Commonly known as:  LUMIGAN  Place 1 drop into both eyes at bedtime.     levothyroxine 75 MCG tablet  Commonly known as:  SYNTHROID, LEVOTHROID  Take 75 mcg by mouth every morning.     lithium carbonate 300 MG capsule  Take 300 mg by mouth 3 (three) times daily with  meals. Takes 0900, 1600 & 2000     risperiDONE 2 MG tablet  Commonly known as:  RISPERDAL  Take 2 mg by mouth daily at 8 pm.     risperiDONE 1 MG tablet  Commonly known as:  RISPERDAL  Take 1 mg by mouth every morning.     SIMBRINZA 1-0.2 % Susp  Generic drug:  Brinzolamide-Brimonidine  Place 1 drop into the left eye 3 (three) times daily.     tamsulosin 0.4 MG Caps  Commonly known as:  FLOMAX  Take 0.4 mg by mouth daily after supper.     temazepam 30 MG capsule  Commonly known as:  RESTORIL  Take 30 mg by mouth every evening.     traZODone 100 MG tablet  Commonly known as:  DESYREL  Take 100 mg by mouth at bedtime.          The results of significant diagnostics from this hospitalization (including imaging,  microbiology, ancillary and laboratory) are listed below for reference.    Significant Diagnostic Studies: Dg Chest 2 View  03/01/2012  *RADIOLOGY REPORT*  Clinical Data: Productive cough.  Smoker.  CHEST - 2 VIEW  Comparison: 09/01/2011.  Findings: Cardiomegaly.  Pulmonary vascular congestion/pulmonary edema superimposed on chronic changes with small bilateral pleural effusions.  Fullness of the hilar region more so than on the prior examination. This may represent pulmonary vasculature although underlying mass or adenopathy not excluded.  Mild basilar atelectasis.  IMPRESSION:  Cardiomegaly.  Pulmonary vascular congestion/pulmonary edema superimposed on chronic changes with small bilateral pleural effusions.  Fullness of the hilar region more so than on the prior examination. This may represent pulmonary vasculature although underlying mass or adenopathy not excluded.   Original Report Authenticated By: Lacy Duverney, M.D.    Nm Myocar Multi W/spect W/wall Motion / Ef  03/04/2012  *RADIOLOGY REPORT*  Clinical Data:  Chest pain.  MYOCARDIAL IMAGING WITH SPECT (REST AND PHARMACOLOGIC-STRESS) GATED LEFT VENTRICULAR WALL MOTION STUDY LEFT VENTRICULAR EJECTION FRACTION  Technique:   Standard myocardial SPECT imaging was performed after resting intravenous injection of 10 mCi Tc-54m sestamibi. Subsequently, intravenous infusion of Lexiscan was performed under the supervision of the Cardiology staff.  At peak effect of the drug, 30 mCi Tc-27m sestamibi was injected intravenously and standard myocardial SPECT  imaging was performed.  Quantitative gated imaging was also performed to evaluate left ventricular wall motion, and estimate left ventricular ejection fraction.  Comparison:  None  Findings: The left ventricle is dilated.  There is an inferior scar.  Global hypokinesis noted on gated SPECT images with estimated ejection fraction at 31%.  No reversible defects to suggest ischemia.  IMPRESSION:  1.  Left ventricular dilatation. 2.  Fairly extensive inferior wall scar. 3.  No findings for ischemia. 4.  Global hypokinesis with estimated ejection fraction of 31%.   Original Report Authenticated By: Rudie Meyer, M.D.    Dg Chest Port 1 View  03/03/2012  *RADIOLOGY REPORT*  Clinical Data: 57 year old male chest pain and shortness of breath.  PORTABLE CHEST - 1 VIEW  Comparison: 03/01/2012 and earlier.  Findings: Portable semi upright AP view at 1316 hours.  Stable lung volumes. Stable cardiomegaly and mediastinal contours.  Small pleural effusions are less apparent.  Mildly decreased interstitial opacity.  No overt edema.  No pneumothorax or new pulmonary opacity. Stable chronic changes proximal right humerus.  IMPRESSION: Interval decreased small pleural effusions and vascular congestion/edema.   Original Report Authenticated By: Erskine Speed, M.D.     Microbiology: Recent Results (from the past 240 hour(s))  MRSA PCR SCREENING     Status: Abnormal   Collection Time    03/02/12  4:23 AM      Result Value Range Status   MRSA by PCR POSITIVE (*) NEGATIVE Final   Comment:            The GeneXpert MRSA Assay (FDA     approved for NASAL specimens     only), is one component of a      comprehensive MRSA colonization     surveillance program. It is not     intended to diagnose MRSA     infection nor to guide or     monitor treatment for     MRSA infections.     RESULT CALLED TO, READ BACK BY AND VERIFIED WITH:     B.ROTH AT 1107 ON [id number] BY C.BONGEL     Labs: Basic Metabolic Panel:  Recent Labs Lab 03/01/12 1940 03/02/12 0456 03/03/12 0438 03/04/12 0424 03/06/12 0427  NA 141 137 141 138 134*  K 4.1 4.5 4.2 3.8 4.0  CL 103 99 99 98 98  CO2 32 33* 36* 35* 30  GLUCOSE 96 125* 173* 97 92  BUN 16 18 27* 27* 28*  CREATININE 1.11 1.07 1.13 1.12 1.01  CALCIUM 8.6 8.5 8.8 8.5 8.8   Liver Function Tests: No results found for this basename: AST, ALT, ALKPHOS, BILITOT, PROT, ALBUMIN,  in the last 168 hours No results found for this basename: LIPASE, AMYLASE,  in the last 168 hours No results found for this basename: AMMONIA,  in the last 168 hours CBC:  Recent Labs Lab 03/01/12 1940 03/02/12 0456 03/06/12 0427  WBC 5.8 3.2* 8.1  HGB 11.9* 12.1* 13.1  HCT 39.8 40.0 41.6  MCV 85.6 84.4 80.0  PLT 180 157 213   Cardiac Enzymes:  Recent Labs Lab 03/02/12 0810 03/02/12 1341 03/02/12 1921 03/02/12 2133 03/03/12 0438  TROPONINI <0.30 <0.30 <0.30 <0.30 <0.30   BNP: BNP (last 3 results)  Recent Labs  03/10/11 1253 08/31/11 1928 03/01/12 1940  PROBNP 52.5 827.6* 5660.0*   CBG:  Recent Labs Lab 03/06/12 0207 03/06/12 0405 03/06/12 0757 03/06/12 1009 03/06/12 1628  GLUCAP 138* 100* 89 94 110*       Signed:  VANN, JESSICA  [facility] 03/07/2012, 8:05 AM

## 2012-03-07 NOTE — ED Notes (Signed)
Per Consulting civil engineer, Pt will not be staying.  Pt was recently d/c'ed from upstairs and refused to go back to the group home.  [place] MD is contacting group home.  Waiting on MD and Charge RN to give further direction.

## 2012-03-07 NOTE — ED Provider Notes (Signed)
History     CSN: [id number]  Arrival date & time 03/07/12  1202   First MD Initiated Contact with Patient 03/07/12 1208      No chief complaint on file. agitation  (Consider location/radiation/quality/duration/timing/severity/associated sxs/prior treatment) HPI This 57 year old male has a history of mental retardation and schizoaffective disorder intermittent explosive behavior anxiety and frequent agitation whenever he is leaving the hospital, he was just admitted for about the last week for a COPD exacerbation and was discharged back to his group home today when he suddenly became agitated and angry combative and tried to escape from the ambulance take him back home from the hospital so the ambulance brought the patient immediately to the emergency department within several minutes of the patient being discharged, the EMS personnel know the patient well, the the nursing staff know the patient well, I discussed the case with the patient's group home caretaker who knows the patient well and states the patient does this every time and does not want [place] to give the patient chemical sedation in the emergency department and requests the ED to her physical restraints if needed to safely transfer the patient home as planned. The patient does not have decision-making capacity to refuse discharge home. The patient wants to stay in the hospital every time he is here according to EMS and nursing staff and even his group home caretaker and always acts out with combative agitated angry behavior when he is going to leave the hospital. Past Medical History  Diagnosis Date  . Seizures   . Psychiatric disorder   . Schizoaffective disorder   . Organic mood disorder   . Intermittent explosive disorder   . Generalized anxiety disorder   . Mild mental retardation   . COPD (chronic obstructive pulmonary disease)   . Peptic ulcer   . Thyroid disease   . Prostate hyperplasia with urinary obstruction   . Blind  left eye   . Chronic respiratory failure   . Cancer   . Diabetes mellitus   . Heart attack   . Diagnosis unknown     pt poor historian unable to give information  . Anxiety   . Diastolic CHF 09/04/2011  . Hypertension   . Coronary artery disease   . Chronic chest pain     Past Surgical History  Procedure Laterality Date  . Hip pinning,cannulated  04/11/2011    Procedure: CANNULATED HIP PINNING;  Surgeon: Mable Paris, MD;  Location: [facility] OR;  Service: Orthopedics;  Laterality: Left;  Perc. Screw Fixation/Left  . Fracture surgery      cannulated hip pinning 04/11/11    Family History  Problem Relation Age of Onset  . Hypertension      History  Substance Use Topics  . Smoking status: Current Every Day Smoker -- 0.25 packs/day for 50 years    Types: Cigarettes  . Smokeless tobacco: Not on file  . Alcohol Use: 1.8 oz/week    3 Cans of beer per week     Comment: 3 beers per day per person      Review of Systems  Unable to perform ROS: Mental status change    Allergies  Peanuts; Haldol; Other; Prolixin; Thorazine; and Lactose intolerance (gi)  Home Medications   Current Outpatient Rx  Name  Route  Sig  Dispense  Refill  . albuterol (PROVENTIL HFA;VENTOLIN HFA) 108 (90 BASE) MCG/ACT inhaler   Inhalation   Inhale 2 puffs into the lungs every 6 (six) hours as needed for  wheezing.   1 Inhaler   2   . amLODipine (NORVASC) 5 MG tablet   Oral   Take 1 tablet (5 mg total) by mouth daily.   30 tablet   0   . aspirin EC 325 MG EC tablet   Oral   Take 1 tablet (325 mg total) by mouth daily.   30 tablet      . bimatoprost (LUMIGAN) 0.01 % SOLN   Both Eyes   Place 1 drop into both eyes at bedtime.          . Brinzolamide-Brimonidine (SIMBRINZA) 1-0.2 % SUSP   Left Eye   Place 1 drop into the left eye 3 (three) times daily.         . furosemide (LASIX) 40 MG tablet   Oral   Take 1 tablet (40 mg total) by mouth daily.   30 tablet   0   .  isosorbide mononitrate (IMDUR) 30 MG 24 hr tablet   Oral   Take 1 tablet (30 mg total) by mouth daily.   30 tablet   0   . levothyroxine (SYNTHROID, LEVOTHROID) 75 MCG tablet   Oral   Take 75 mcg by mouth every morning.          Marland Kitchen lisinopril (PRINIVIL,ZESTRIL) 5 MG tablet   Oral   Take 1 tablet (5 mg total) by mouth daily.   30 tablet   0   . lithium carbonate 300 MG capsule   Oral   Take 300 mg by mouth 3 (three) times daily with meals. Takes 0900, 1600 & 2000         . predniSONE (DELTASONE) 20 MG tablet      20 mg x 2 days and then 10 mg x 2 days and d/c   6 tablet   0   . risperiDONE (RISPERDAL) 1 MG tablet   Oral   Take 1 mg by mouth every morning.          . risperiDONE (RISPERDAL) 2 MG tablet   Oral   Take 2 mg by mouth daily at 8 pm.          . Tamsulosin HCl (FLOMAX) 0.4 MG CAPS   Oral   Take 0.4 mg by mouth daily after supper.         . temazepam (RESTORIL) 30 MG capsule   Oral   Take 30 mg by mouth every evening.         . traZODone (DESYREL) 100 MG tablet   Oral   Take 100 mg by mouth at bedtime.             There were no vitals taken for this visit.  Physical Exam  Nursing note and vitals reviewed. Constitutional:  Awake, alert, nontoxic appearance.  HENT:  Head: Atraumatic.  Eyes: Right eye exhibits no discharge. Left eye exhibits no discharge.  Neck: Neck supple.  Cardiovascular: Normal rate and regular rhythm.   No murmur heard. Pulmonary/Chest: Effort normal and breath sounds normal. No respiratory distress. He has no wheezes. He has no rales. He exhibits no tenderness.  Abdominal: Soft. Bowel sounds are normal. He exhibits no distension. There is no tenderness. There is no rebound and no guarding.  Musculoskeletal: He exhibits no edema and no tenderness.  Baseline ROM, no obvious new focal weakness.  Neurological: He is alert.  Mental status and motor strength appears baseline for patient and situation.  Skin: No rash  noted.  Psychiatric:  Angry yelling agitated combative doesn't want to go home yet trying to leave the room and verbally de-escalated repeatedly by ED staff    ED Course  Procedures (including critical care time) Case d/w EMS, Police, ED staff, ACT, group home caretaker Nonnie Done (no answer at [facility] guardian), Pt to be escorted home by police with EMS with physical restraint order to allow safe transfer to group home.  Labs Reviewed - No data to display No results found.   1. Agitation   2. Mental retardation       MDM  Pt stable in ED with no significant deterioration in condition.   I doubt any other EMC precluding discharge at this time including, but not necessarily limited to the following: SI/HI.        Hurman Horn, MD 03/08/12 [phone]

## 2012-03-07 NOTE — ED Notes (Signed)
[id number]> Expected date:<BR> Expected time:<BR> Means of arrival:<BR> Comments:<BR> Pt d/c from 1423 refused to go home, combative coming back

## 2012-03-07 NOTE — ED Notes (Signed)
Pt d/c'd from 1423, pt was medical cleared, pt was combative enroute with [facility] refusing to go home, wanted to come here.

## 2012-04-26 ENCOUNTER — Emergency Department (HOSPITAL_COMMUNITY)
Admission: EM | Admit: 2012-04-26 | Discharge: 2012-04-26 | Discharge status: Against Medical Advice | Payer: Medicare Other | Attending: Emergency Medicine | Admitting: Emergency Medicine

## 2012-04-26 ENCOUNTER — Encounter (HOSPITAL_COMMUNITY): Payer: Self-pay

## 2012-04-26 DIAGNOSIS — IMO0002 Reserved for concepts with insufficient information to code with codable children: Secondary | ICD-10-CM | POA: Insufficient documentation

## 2012-04-26 NOTE — ED Notes (Signed)
His p.o.a. And manager of the group home states that "He (pt.) does this sometimes for attention". He states this is particularly a problem if he gets too much coffee.

## 2012-04-26 NOTE — ED Notes (Signed)
He arrives with a Corporate treasurer in no distress; loudly proclaiming he is "short of breath".  Pt. Is in no distress.  Pt. Seems profoundly m.r. With slurred speech

## 2012-04-27 ENCOUNTER — Encounter (HOSPITAL_BASED_OUTPATIENT_CLINIC_OR_DEPARTMENT_OTHER): Payer: Self-pay

## 2012-04-27 ENCOUNTER — Emergency Department (HOSPITAL_BASED_OUTPATIENT_CLINIC_OR_DEPARTMENT_OTHER)
Admission: EM | Admit: 2012-04-27 | Discharge: 2012-04-27 | Disposition: A | Discharge status: Home / Self Care | Payer: Medicare Other | Attending: Emergency Medicine | Admitting: Emergency Medicine

## 2012-04-27 DIAGNOSIS — Z8669 Personal history of other diseases of the nervous system and sense organs: Secondary | ICD-10-CM | POA: Insufficient documentation

## 2012-04-27 DIAGNOSIS — Z7982 Long term (current) use of aspirin: Secondary | ICD-10-CM | POA: Insufficient documentation

## 2012-04-27 DIAGNOSIS — F17200 Nicotine dependence, unspecified, uncomplicated: Secondary | ICD-10-CM | POA: Insufficient documentation

## 2012-04-27 DIAGNOSIS — W2209XA Striking against other stationary object, initial encounter: Secondary | ICD-10-CM | POA: Insufficient documentation

## 2012-04-27 DIAGNOSIS — J449 Chronic obstructive pulmonary disease, unspecified: Secondary | ICD-10-CM | POA: Insufficient documentation

## 2012-04-27 DIAGNOSIS — R079 Chest pain, unspecified: Secondary | ICD-10-CM | POA: Insufficient documentation

## 2012-04-27 DIAGNOSIS — J4489 Other specified chronic obstructive pulmonary disease: Secondary | ICD-10-CM | POA: Insufficient documentation

## 2012-04-27 DIAGNOSIS — S81809A Unspecified open wound, unspecified lower leg, initial encounter: Secondary | ICD-10-CM | POA: Insufficient documentation

## 2012-04-27 DIAGNOSIS — Y9389 Activity, other specified: Secondary | ICD-10-CM | POA: Insufficient documentation

## 2012-04-27 DIAGNOSIS — E079 Disorder of thyroid, unspecified: Secondary | ICD-10-CM | POA: Insufficient documentation

## 2012-04-27 DIAGNOSIS — I2510 Atherosclerotic heart disease of native coronary artery without angina pectoris: Secondary | ICD-10-CM | POA: Insufficient documentation

## 2012-04-27 DIAGNOSIS — G8929 Other chronic pain: Secondary | ICD-10-CM | POA: Insufficient documentation

## 2012-04-27 DIAGNOSIS — Z859 Personal history of malignant neoplasm, unspecified: Secondary | ICD-10-CM | POA: Insufficient documentation

## 2012-04-27 DIAGNOSIS — Z8709 Personal history of other diseases of the respiratory system: Secondary | ICD-10-CM | POA: Insufficient documentation

## 2012-04-27 DIAGNOSIS — S81009A Unspecified open wound, unspecified knee, initial encounter: Secondary | ICD-10-CM | POA: Insufficient documentation

## 2012-04-27 DIAGNOSIS — Y9210 Unspecified residential institution as the place of occurrence of the external cause: Secondary | ICD-10-CM | POA: Insufficient documentation

## 2012-04-27 DIAGNOSIS — F411 Generalized anxiety disorder: Secondary | ICD-10-CM | POA: Insufficient documentation

## 2012-04-27 DIAGNOSIS — Z8711 Personal history of peptic ulcer disease: Secondary | ICD-10-CM | POA: Insufficient documentation

## 2012-04-27 DIAGNOSIS — Z79899 Other long term (current) drug therapy: Secondary | ICD-10-CM | POA: Insufficient documentation

## 2012-04-27 DIAGNOSIS — N400 Enlarged prostate without lower urinary tract symptoms: Secondary | ICD-10-CM | POA: Insufficient documentation

## 2012-04-27 DIAGNOSIS — S81812A Laceration without foreign body, left lower leg, initial encounter: Secondary | ICD-10-CM

## 2012-04-27 DIAGNOSIS — E119 Type 2 diabetes mellitus without complications: Secondary | ICD-10-CM | POA: Insufficient documentation

## 2012-04-27 DIAGNOSIS — Z23 Encounter for immunization: Secondary | ICD-10-CM | POA: Insufficient documentation

## 2012-04-27 DIAGNOSIS — Z8674 Personal history of sudden cardiac arrest: Secondary | ICD-10-CM | POA: Insufficient documentation

## 2012-04-27 DIAGNOSIS — I5032 Chronic diastolic (congestive) heart failure: Secondary | ICD-10-CM | POA: Insufficient documentation

## 2012-04-27 MED ORDER — TETANUS-DIPHTH-ACELL PERTUSSIS 5-2.5-18.5 LF-MCG/0.5 IM SUSP
0.5000 mL | Freq: Once | INTRAMUSCULAR | Status: AC
  Administered 2012-04-27: 0.5 mL via INTRAMUSCULAR
  Filled 2012-04-27: qty 0.5

## 2012-04-27 NOTE — ED Notes (Signed)
Pt resides in a group home, states that he fell getting out of a shower, knocked a lid off of the top of the toilet, breaking the lid which lacerated his L lower leg.  Pt is also c/o L arm pain at the wrist.  PMS intact bilaterally, ROM not restricted.  LLE bleeding controlled, bandage in place.

## 2012-04-27 NOTE — ED Provider Notes (Signed)
History     CSN: [id number]  Arrival date & time 04/27/12  1324   First MD Initiated Contact with Patient 04/27/12 1000      Chief Complaint  Patient presents with  . Fall    (Consider location/radiation/quality/duration/timing/severity/associated sxs/prior treatment) Patient is a 57 y.o. male presenting with fall.  Fall   Level 5 caveat due to MR Pt brought to the ED from [facility] after he had a fall in bathroom earlier this morning sustaining a laceration to his L lower leg. EMS was called, wound was dressed and no other injuries found. Advised to come to the ED for repair. Unknown tetanus.    Past Medical History  Diagnosis Date  . Seizures   . Psychiatric disorder   . Schizoaffective disorder   . Organic mood disorder   . Intermittent explosive disorder   . Generalized anxiety disorder   . Mild mental retardation   . COPD (chronic obstructive pulmonary disease)   . Peptic ulcer   . Thyroid disease   . Prostate hyperplasia with urinary obstruction   . Blind left eye   . Chronic respiratory failure   . Cancer   . Diabetes mellitus   . Heart attack   . Diagnosis unknown     pt poor historian unable to give information  . Anxiety   . Diastolic CHF 09/04/2011  . Hypertension   . Coronary artery disease   . Chronic chest pain     Past Surgical History  Procedure Laterality Date  . Hip pinning,cannulated  04/11/2011    Procedure: CANNULATED HIP PINNING;  Surgeon: Mable Paris, MD;  Location: [facility] OR;  Service: Orthopedics;  Laterality: Left;  Perc. Screw Fixation/Left  . Fracture surgery      cannulated hip pinning 04/11/11    Family History  Problem Relation Age of Onset  . Hypertension      History  Substance Use Topics  . Smoking status: Current Every Day Smoker -- 0.25 packs/day for 50 years    Types: Cigarettes  . Smokeless tobacco: Not on file  . Alcohol Use: 1.8 oz/week    3 Cans of beer per week     Comment: 3 beers per day per person       Review of Systems Unable to assess due to mental status.   Allergies  Peanuts; Haldol; Other; Prolixin; Thorazine; and Lactose intolerance (gi)  Home Medications   Current Outpatient Rx  Name  Route  Sig  Dispense  Refill  . albuterol (PROVENTIL HFA;VENTOLIN HFA) 108 (90 BASE) MCG/ACT inhaler   Inhalation   Inhale 2 puffs into the lungs every 6 (six) hours as needed for wheezing.   1 Inhaler   2   . amLODipine (NORVASC) 5 MG tablet   Oral   Take 1 tablet (5 mg total) by mouth daily.   30 tablet   0   . aspirin EC 325 MG EC tablet   Oral   Take 1 tablet (325 mg total) by mouth daily.   30 tablet      . bimatoprost (LUMIGAN) 0.01 % SOLN   Both Eyes   Place 1 drop into both eyes at bedtime.          . furosemide (LASIX) 40 MG tablet   Oral   Take 1 tablet (40 mg total) by mouth daily.   30 tablet   0   . isosorbide mononitrate (IMDUR) 30 MG 24 hr tablet   Oral  Take 1 tablet (30 mg total) by mouth daily.   30 tablet   0   . levothyroxine (SYNTHROID, LEVOTHROID) 75 MCG tablet   Oral   Take 75 mcg by mouth every morning.          Marland Kitchen lisinopril (PRINIVIL,ZESTRIL) 5 MG tablet   Oral   Take 1 tablet (5 mg total) by mouth daily.   30 tablet   0   . lithium carbonate 300 MG capsule   Oral   Take 300 mg by mouth 3 (three) times daily with meals. Takes 0900, 1600 & 2000         . risperiDONE (RISPERDAL) 1 MG tablet   Oral   Take 1 mg by mouth every morning.          . risperiDONE (RISPERDAL) 2 MG tablet   Oral   Take 2 mg by mouth daily at 8 pm.          . Tamsulosin HCl (FLOMAX) 0.4 MG CAPS   Oral   Take 0.4 mg by mouth daily after supper.         . temazepam (RESTORIL) 30 MG capsule   Oral   Take 30 mg by mouth every evening.         . traZODone (DESYREL) 100 MG tablet   Oral   Take 100 mg by mouth at bedtime.             BP 149/94  Pulse 73  Temp(Src) 97.5 F (36.4 C) (Oral)  Resp 20  Ht 5\' 10"  (1.778 m)  Wt 164 lb  (74.39 kg)  BMI 23.53 kg/m2  SpO2 83%  Physical Exam  Nursing note and vitals reviewed. Constitutional: He appears well-developed and well-nourished.  HENT:  Head: Normocephalic and atraumatic.  Eyes: EOM are normal. Pupils are equal, round, and reactive to light.  Neck: Normal range of motion. Neck supple.  Cardiovascular: Normal rate, normal heart sounds and intact distal pulses.   Pulmonary/Chest: Effort normal and breath sounds normal.  Abdominal: Bowel sounds are normal. He exhibits no distension. There is no tenderness.  Musculoskeletal: Normal range of motion. He exhibits tenderness (5cm laceration to L anterior lower leg). He exhibits no edema.  Neurological: He is alert. He has normal strength. No cranial nerve deficit or sensory deficit.  Skin: Skin is warm and dry. No rash noted.  Psychiatric: His behavior is normal.    ED Course  Procedures (including critical care time)  Labs Reviewed - No data to display No results found.   1. Laceration of leg excluding thigh, left, initial encounter       MDM  Pt initially uncooperative with repair, refused to allow anesthesia, but redirected and with nursing/EMT help repair was done.   LACERATION REPAIR Performed by: Pollyann Savoy. Consent: Verbal consent obtained. Risks and benefits: risks, benefits and alternatives were discussed Patient identity confirmed: provided demographic data Time out performed prior to procedure Prepped and Draped in normal sterile fashion Wound explored Laceration Location: L lower leg Laceration Length: 5cm No Foreign Bodies seen or palpated Anesthesia: local infiltration Local anesthetic: lidocaine 1% no epinephrine Anesthetic total: 3 ml Irrigation method: syringe Amount of cleaning: standard Skin closure: staples Number of sutures or staples: 6 Technique: staples Patient tolerance: Patient tolerated the procedure well with no immediate complications.         Charles B.  Bernette Mayers, MD 04/27/12 [phone]

## 2012-07-09 ENCOUNTER — Encounter (HOSPITAL_BASED_OUTPATIENT_CLINIC_OR_DEPARTMENT_OTHER): Payer: Self-pay | Admitting: *Deleted

## 2012-07-09 ENCOUNTER — Encounter (HOSPITAL_COMMUNITY): Payer: Self-pay | Admitting: *Deleted

## 2012-07-09 ENCOUNTER — Emergency Department (HOSPITAL_COMMUNITY)
Admission: EM | Admit: 2012-07-09 | Discharge: 2012-07-09 | Disposition: A | Discharge status: Home / Self Care | Payer: Medicare Other | Attending: Emergency Medicine | Admitting: Emergency Medicine

## 2012-07-09 ENCOUNTER — Emergency Department (HOSPITAL_BASED_OUTPATIENT_CLINIC_OR_DEPARTMENT_OTHER)
Admission: EM | Admit: 2012-07-09 | Discharge: 2012-07-10 | Disposition: A | Discharge status: Home / Self Care | Payer: Medicare Other | Attending: Emergency Medicine | Admitting: Emergency Medicine

## 2012-07-09 ENCOUNTER — Emergency Department (HOSPITAL_COMMUNITY): Payer: Medicare Other

## 2012-07-09 DIAGNOSIS — F259 Schizoaffective disorder, unspecified: Secondary | ICD-10-CM | POA: Insufficient documentation

## 2012-07-09 DIAGNOSIS — M25532 Pain in left wrist: Secondary | ICD-10-CM

## 2012-07-09 DIAGNOSIS — I2510 Atherosclerotic heart disease of native coronary artery without angina pectoris: Secondary | ICD-10-CM | POA: Insufficient documentation

## 2012-07-09 DIAGNOSIS — F17200 Nicotine dependence, unspecified, uncomplicated: Secondary | ICD-10-CM | POA: Insufficient documentation

## 2012-07-09 DIAGNOSIS — Y929 Unspecified place or not applicable: Secondary | ICD-10-CM | POA: Insufficient documentation

## 2012-07-09 DIAGNOSIS — Z859 Personal history of malignant neoplasm, unspecified: Secondary | ICD-10-CM | POA: Insufficient documentation

## 2012-07-09 DIAGNOSIS — I5030 Unspecified diastolic (congestive) heart failure: Secondary | ICD-10-CM | POA: Insufficient documentation

## 2012-07-09 DIAGNOSIS — E079 Disorder of thyroid, unspecified: Secondary | ICD-10-CM | POA: Insufficient documentation

## 2012-07-09 DIAGNOSIS — Z8711 Personal history of peptic ulcer disease: Secondary | ICD-10-CM | POA: Insufficient documentation

## 2012-07-09 DIAGNOSIS — IMO0002 Reserved for concepts with insufficient information to code with codable children: Secondary | ICD-10-CM | POA: Insufficient documentation

## 2012-07-09 DIAGNOSIS — H5440 Blindness, one eye, unspecified eye: Secondary | ICD-10-CM | POA: Insufficient documentation

## 2012-07-09 DIAGNOSIS — J4489 Other specified chronic obstructive pulmonary disease: Secondary | ICD-10-CM | POA: Insufficient documentation

## 2012-07-09 DIAGNOSIS — Z8659 Personal history of other mental and behavioral disorders: Secondary | ICD-10-CM | POA: Insufficient documentation

## 2012-07-09 DIAGNOSIS — W19XXXA Unspecified fall, initial encounter: Secondary | ICD-10-CM | POA: Insufficient documentation

## 2012-07-09 DIAGNOSIS — E119 Type 2 diabetes mellitus without complications: Secondary | ICD-10-CM | POA: Insufficient documentation

## 2012-07-09 DIAGNOSIS — F6381 Intermittent explosive disorder: Secondary | ICD-10-CM | POA: Insufficient documentation

## 2012-07-09 DIAGNOSIS — I252 Old myocardial infarction: Secondary | ICD-10-CM | POA: Insufficient documentation

## 2012-07-09 DIAGNOSIS — S50812A Abrasion of left forearm, initial encounter: Secondary | ICD-10-CM

## 2012-07-09 DIAGNOSIS — J449 Chronic obstructive pulmonary disease, unspecified: Secondary | ICD-10-CM | POA: Insufficient documentation

## 2012-07-09 DIAGNOSIS — Y939 Activity, unspecified: Secondary | ICD-10-CM | POA: Insufficient documentation

## 2012-07-09 DIAGNOSIS — G40909 Epilepsy, unspecified, not intractable, without status epilepticus: Secondary | ICD-10-CM | POA: Insufficient documentation

## 2012-07-09 DIAGNOSIS — Z87448 Personal history of other diseases of urinary system: Secondary | ICD-10-CM | POA: Insufficient documentation

## 2012-07-09 DIAGNOSIS — Z79899 Other long term (current) drug therapy: Secondary | ICD-10-CM | POA: Insufficient documentation

## 2012-07-09 DIAGNOSIS — Y9302 Activity, running: Secondary | ICD-10-CM | POA: Insufficient documentation

## 2012-07-09 DIAGNOSIS — S8990XA Unspecified injury of unspecified lower leg, initial encounter: Secondary | ICD-10-CM | POA: Insufficient documentation

## 2012-07-09 DIAGNOSIS — W268XXA Contact with other sharp object(s), not elsewhere classified, initial encounter: Secondary | ICD-10-CM | POA: Insufficient documentation

## 2012-07-09 DIAGNOSIS — F70 Mild intellectual disabilities: Secondary | ICD-10-CM | POA: Insufficient documentation

## 2012-07-09 DIAGNOSIS — Z8709 Personal history of other diseases of the respiratory system: Secondary | ICD-10-CM | POA: Insufficient documentation

## 2012-07-09 DIAGNOSIS — M79604 Pain in right leg: Secondary | ICD-10-CM

## 2012-07-09 DIAGNOSIS — Z7982 Long term (current) use of aspirin: Secondary | ICD-10-CM | POA: Insufficient documentation

## 2012-07-09 DIAGNOSIS — J9610 Chronic respiratory failure, unspecified whether with hypoxia or hypercapnia: Secondary | ICD-10-CM | POA: Insufficient documentation

## 2012-07-09 DIAGNOSIS — G8929 Other chronic pain: Secondary | ICD-10-CM | POA: Insufficient documentation

## 2012-07-09 DIAGNOSIS — I10 Essential (primary) hypertension: Secondary | ICD-10-CM | POA: Insufficient documentation

## 2012-07-09 DIAGNOSIS — N138 Other obstructive and reflux uropathy: Secondary | ICD-10-CM | POA: Insufficient documentation

## 2012-07-09 DIAGNOSIS — Y9229 Other specified public building as the place of occurrence of the external cause: Secondary | ICD-10-CM | POA: Insufficient documentation

## 2012-07-09 DIAGNOSIS — F411 Generalized anxiety disorder: Secondary | ICD-10-CM | POA: Insufficient documentation

## 2012-07-09 DIAGNOSIS — S61509A Unspecified open wound of unspecified wrist, initial encounter: Secondary | ICD-10-CM | POA: Insufficient documentation

## 2012-07-09 DIAGNOSIS — N401 Enlarged prostate with lower urinary tract symptoms: Secondary | ICD-10-CM | POA: Insufficient documentation

## 2012-07-09 DIAGNOSIS — M25539 Pain in unspecified wrist: Secondary | ICD-10-CM | POA: Insufficient documentation

## 2012-07-09 MED ORDER — ACETAMINOPHEN 325 MG PO TABS
650.0000 mg | ORAL_TABLET | Freq: Once | ORAL | Status: AC
  Administered 2012-07-09: 650 mg via ORAL
  Filled 2012-07-09: qty 2

## 2012-07-09 MED ORDER — LIDOCAINE-EPINEPHRINE-TETRACAINE (LET) SOLUTION
NASAL | Status: AC
  Administered 2012-07-09: 3 mL via TOPICAL
  Filled 2012-07-09: qty 3

## 2012-07-09 MED ORDER — LIDOCAINE-EPINEPHRINE-TETRACAINE (LET) SOLUTION: 3.0000 mL | Freq: Once | NASAL | Status: AC

## 2012-07-09 NOTE — ED Notes (Addendum)
Pt from a group home. Cut right wrist earlier today and was brought to [facility] and had it sutured. Returned to the group home and cut his left wrist. Group Home requested that he come to [facility] and have left wrist sutured and return back to the Group Home. Pt is developmentally disabled. Group Home states pt cut his wrists for attention. Vital Signs: 150/105, Heart rate 74, RR 18

## 2012-07-09 NOTE — ED Notes (Signed)
Pt arrives by EMS, reports pt ran away from his adult day care center. Pt c/o leg and arm pain. EMS reports pt has abrasions to arms/legs.

## 2012-07-09 NOTE — ED Provider Notes (Signed)
History    This chart was scribed for non-physician practitioner Phillip Crigler, PA-C, working with Phillip Nielsen, MD by [name], ED Scribe. This patient was seen in room WTR8/WTR8 and the patient's care was started at 1506.  CSN: [id number] Arrival date & time 07/09/12  1439  First MD Initiated Contact with Patient 07/09/12 1506     Chief Complaint  Patient presents with  . Arm Pain  . Leg Pain    The history is provided by the patient and the EMS personnel. No language interpreter was used.   HPI Comments: [name] is a 57 y.o. male brought in by ambulance, who presents to the Emergency Department complaining of sudden onset, constant, moderate right arm abrasions and right leg pain that originates in his right hip and radiates to his ankle and began today when he fell on cement as he was running away from his adult day care center. He denies hitting his head or LOC and was ambulatory after the fall. He is on no blood thinners. The leg pain is worse with standing but he is ambulatory. He denies back pain or any other pain.    Past Medical History  Diagnosis Date  . Seizures   . Psychiatric disorder   . Schizoaffective disorder   . Organic mood disorder   . Intermittent explosive disorder   . Generalized anxiety disorder   . Mild mental retardation   . COPD (chronic obstructive pulmonary disease)   . Peptic ulcer   . Thyroid disease   . Prostate hyperplasia with urinary obstruction   . Blind left eye   . Chronic respiratory failure   . Cancer   . Diabetes mellitus   . Heart attack   . Diagnosis unknown     pt poor historian unable to give information  . Anxiety   . Diastolic CHF 09/04/2011  . Hypertension   . Coronary artery disease   . Chronic chest pain    Past Surgical History  Procedure Laterality Date  . Hip pinning,cannulated  04/11/2011    Procedure: CANNULATED HIP PINNING;  Surgeon: Mable Paris, MD;  Location: [facility] OR;  Service: Orthopedics;   Laterality: Left;  Perc. Screw Fixation/Left  . Fracture surgery      cannulated hip pinning 04/11/11   Family History  Problem Relation Age of Onset  . Hypertension     History  Substance Use Topics  . Smoking status: Current Every Day Smoker -- 0.25 packs/day for 50 years    Types: Cigarettes  . Smokeless tobacco: Not on file  . Alcohol Use: 1.8 oz/week    3 Cans of beer per week     Comment: 3 beers per day per person    Review of Systems  Constitutional: Negative for activity change.  HENT: Negative for neck pain.   Gastrointestinal: Negative for abdominal pain.  Musculoskeletal: Positive for arthralgias. Negative for back pain, joint swelling and gait problem.  Skin: Positive for wound.  Neurological: Negative for syncope, weakness and numbness.    Allergies  Peanuts; Haldol; Other; Prolixin; Thorazine; and Lactose intolerance (gi)  Home Medications   Current Outpatient Rx  Name  Route  Sig  Dispense  Refill  . albuterol (PROVENTIL HFA;VENTOLIN HFA) 108 (90 BASE) MCG/ACT inhaler   Inhalation   Inhale 2 puffs into the lungs every 6 (six) hours as needed for wheezing.   1 Inhaler   2   . amLODipine (NORVASC) 5 MG tablet   Oral  Take 1 tablet (5 mg total) by mouth daily.   30 tablet   0   . aspirin EC 325 MG EC tablet   Oral   Take 1 tablet (325 mg total) by mouth daily.   30 tablet      . bimatoprost (LUMIGAN) 0.01 % SOLN   Both Eyes   Place 1 drop into both eyes at bedtime.          . furosemide (LASIX) 40 MG tablet   Oral   Take 1 tablet (40 mg total) by mouth daily.   30 tablet   0   . isosorbide mononitrate (IMDUR) 30 MG 24 hr tablet   Oral   Take 1 tablet (30 mg total) by mouth daily.   30 tablet   0   . levothyroxine (SYNTHROID, LEVOTHROID) 75 MCG tablet   Oral   Take 75 mcg by mouth every morning.          Marland Kitchen lisinopril (PRINIVIL,ZESTRIL) 5 MG tablet   Oral   Take 1 tablet (5 mg total) by mouth daily.   30 tablet   0   .  lithium carbonate 300 MG capsule   Oral   Take 300 mg by mouth 3 (three) times daily with meals. Takes 0900, 1600 & 2000         . risperiDONE (RISPERDAL) 1 MG tablet   Oral   Take 1 mg by mouth every morning.          . risperiDONE (RISPERDAL) 2 MG tablet   Oral   Take 2 mg by mouth daily at 8 pm.          . Tamsulosin HCl (FLOMAX) 0.4 MG CAPS   Oral   Take 0.4 mg by mouth daily after supper.         . temazepam (RESTORIL) 30 MG capsule   Oral   Take 30 mg by mouth every evening.         . traZODone (DESYREL) 100 MG tablet   Oral   Take 100 mg by mouth at bedtime.            BP 173/101  Pulse 71  Temp(Src) 98.9 F (37.2 C) (Oral)  Resp 18  SpO2 92%  Physical Exam  Nursing note and vitals reviewed. Constitutional: He is oriented to person, place, and time. He appears well-developed and well-nourished. No distress.  HENT:  Head: Normocephalic and atraumatic. Head is without raccoon's eyes and without Battle's sign.  Right Ear: Tympanic membrane, external ear and ear canal normal. No hemotympanum.  Left Ear: Tympanic membrane, external ear and ear canal normal. No hemotympanum.  Nose: Nose normal. No nasal septal hematoma.  Mouth/Throat: Oropharynx is clear and moist. No oropharyngeal exudate.  Eyes: Conjunctivae, EOM and lids are normal. Pupils are equal, round, and reactive to light.  No visible hyphema  Neck: Normal range of motion. Neck supple. No tracheal deviation present.  Cardiovascular: Normal rate, regular rhythm and normal pulses.   Pulses:      Radial pulses are 2+ on the right side, and 2+ on the left side.  Pulmonary/Chest: Effort normal and breath sounds normal. No respiratory distress.  Abdominal: Soft. There is no tenderness.  Old ecchymosis LLQ laterally. Non tender.   Musculoskeletal: Normal range of motion. He exhibits tenderness. He exhibits no edema.       Right shoulder: Normal.       Right hip: He exhibits tenderness. He exhibits  normal range  of motion and no bony tenderness.       Right knee: He exhibits normal range of motion, no swelling and no bony tenderness. Tenderness found.       Right ankle: He exhibits normal range of motion and no swelling. Tenderness. No lateral malleolus, no medial malleolus, no head of 5th metatarsal and no proximal fibula tenderness found.       Cervical back: He exhibits normal range of motion, no tenderness and no bony tenderness.       Thoracic back: He exhibits no tenderness and no bony tenderness.       Lumbar back: He exhibits no tenderness and no bony tenderness.  No cervical, thoracic or lumbar midline spine tenderness. Tenderness to right hip, knee and ankle.   Neurological: He is alert and oriented to person, place, and time. He has normal strength and normal reflexes. No cranial nerve deficit or sensory deficit. Coordination normal. GCS eye subscore is 4. GCS verbal subscore is 5. GCS motor subscore is 6.  Motor, sensation, and vascular distal to the injury is fully intact.   Skin: Skin is warm and dry.  Abrasions and skin tears to dorsum of right forearm.   Psychiatric: He has a normal mood and affect. His behavior is normal.    ED Course  Procedures (including critical care time) DIAGNOSTIC STUDIES: Oxygen Saturation is 92% on RA, adequate by my interpretation.    COORDINATION OF CARE: 3:12 PM Discussed treatment plan which includes right wrist xray and pain medication with pt at bedside and pt agreed to plan.    Labs Reviewed - No data to display Dg Wrist Complete Right  07/09/2012   *RADIOLOGY REPORT*  Clinical Data: Right wrist pain post fall  RIGHT WRIST - COMPLETE 3+ VIEW  Comparison: None  Findings: Osseous demineralization. Joint spaces preserved. No acute fracture, dislocation, or bone destruction.  IMPRESSION: No acute osseous abnormalities.   Original Report Authenticated By: Ulyses Southward, M.D.   1. Abrasion of left forearm, initial encounter   2. Wrist pain,  acute, left   3. Musculoskeletal leg pain, right     Vital signs reviewed and are as follows: Filed Vitals:   07/09/12 1445  BP: 173/101  Pulse: 71  Temp: 98.9 F (37.2 C)  Resp: 18   Wound care by nurse. Patient ambulatory in hall without assistance.   Pt informed of results. Provided with wound care instructions.   Pt urged to return with worsening pain, worsening swelling, expanding area of redness or streaking up extremity, fever, or any other concerns. Pt verbalizes understanding and agrees with plan.    MDM  Patient with abrasions and pain R arm and leg. X-ray wrist neg. No imaging of LE necessary as patient has full ROM and ambulates without pain or difficulty. Limbs are neurovascularly intact. There is no sign of head injury or trauma. Patient is at his baseline mental status. Exam unchanged during ED stay. Conservative measurements indicated.   I personally performed the services described in this documentation, which was scribed in my presence. The recorded information has been reviewed and is accurate.    Phillip Crigler, PA-C 07/09/12 [phone]

## 2012-07-09 NOTE — ED Provider Notes (Signed)
Medical screening examination/treatment/procedure(s) were performed by non-physician practitioner and as supervising physician I was immediately available for consultation/collaboration.  Jaidyn Kuhl, MD 07/09/12 2346 

## 2012-07-09 NOTE — ED Provider Notes (Signed)
History    CSN: [id number] Arrival date & time 07/09/12  2323  First MD Initiated Contact with Patient 07/09/12 2340     Chief Complaint  Patient presents with  . Extremity Laceration   (Consider location/radiation/quality/duration/timing/severity/associated sxs/prior Treatment) Patient is a 57 y.o. male presenting with skin laceration. The history is provided by the EMS personnel. The history is limited by the condition of the patient. No language interpreter was used.  Laceration Location: left wrist. Length (cm):  2 Depth:  Through dermis Quality: straight   Bleeding: controlled   Laceration mechanism:  Unable to specify Foreign body present:  No foreign bodies Relieved by:  Nothing Worsened by:  Nothing tried Ineffective treatments:  None tried Tetanus status:  Up to date  Past Medical History  Diagnosis Date  . Seizures   . Psychiatric disorder   . Schizoaffective disorder   . Organic mood disorder   . Intermittent explosive disorder   . Generalized anxiety disorder   . Mild mental retardation   . COPD (chronic obstructive pulmonary disease)   . Peptic ulcer   . Thyroid disease   . Prostate hyperplasia with urinary obstruction   . Blind left eye   . Chronic respiratory failure   . Cancer   . Diabetes mellitus   . Heart attack   . Diagnosis unknown     pt poor historian unable to give information  . Anxiety   . Diastolic CHF 09/04/2011  . Hypertension   . Coronary artery disease   . Chronic chest pain    Past Surgical History  Procedure Laterality Date  . Hip pinning,cannulated  04/11/2011    Procedure: CANNULATED HIP PINNING;  Surgeon: Mable Paris, MD;  Location: [facility] OR;  Service: Orthopedics;  Laterality: Left;  Perc. Screw Fixation/Left  . Fracture surgery      cannulated hip pinning 04/11/11   Family History  Problem Relation Age of Onset  . Hypertension     History  Substance Use Topics  . Smoking status: Current Every Day Smoker --  0.25 packs/day for 50 years    Types: Cigarettes  . Smokeless tobacco: Not on file  . Alcohol Use: 1.8 oz/week    3 Cans of beer per week     Comment: 3 beers per day per person    Review of Systems  Unable to perform ROS   Allergies  Peanuts; Haldol; Other; Prolixin; Thorazine; and Lactose intolerance (gi)  Home Medications   Current Outpatient Rx  Name  Route  Sig  Dispense  Refill  . albuterol (PROVENTIL HFA;VENTOLIN HFA) 108 (90 BASE) MCG/ACT inhaler   Inhalation   Inhale 2 puffs into the lungs every 6 (six) hours as needed for wheezing.   1 Inhaler   2   . amLODipine (NORVASC) 5 MG tablet   Oral   Take 1 tablet (5 mg total) by mouth daily.   30 tablet   0   . aspirin EC 325 MG EC tablet   Oral   Take 1 tablet (325 mg total) by mouth daily.   30 tablet      . bimatoprost (LUMIGAN) 0.01 % SOLN   Both Eyes   Place 1 drop into both eyes at bedtime.          . furosemide (LASIX) 40 MG tablet   Oral   Take 1 tablet (40 mg total) by mouth daily.   30 tablet   0   . isosorbide mononitrate (IMDUR) 30  MG 24 hr tablet   Oral   Take 1 tablet (30 mg total) by mouth daily.   30 tablet   0   . levothyroxine (SYNTHROID, LEVOTHROID) 75 MCG tablet   Oral   Take 75 mcg by mouth every morning.          Marland Kitchen lisinopril (PRINIVIL,ZESTRIL) 5 MG tablet   Oral   Take 1 tablet (5 mg total) by mouth daily.   30 tablet   0   . lithium carbonate 300 MG capsule   Oral   Take 300 mg by mouth 3 (three) times daily with meals. Takes 0900, 1600 & 2000         . risperiDONE (RISPERDAL) 1 MG tablet   Oral   Take 1 mg by mouth every morning.          . risperiDONE (RISPERDAL) 2 MG tablet   Oral   Take 2 mg by mouth daily at 8 pm.          . Tamsulosin HCl (FLOMAX) 0.4 MG CAPS   Oral   Take 0.4 mg by mouth daily after supper.         . temazepam (RESTORIL) 30 MG capsule   Oral   Take 30 mg by mouth every evening.         . traZODone (DESYREL) 100 MG  tablet   Oral   Take 100 mg by mouth at bedtime.            BP 152/95  Pulse 53  Temp(Src) 97.8 F (36.6 C) (Oral)  Resp 20  SpO2 96% Physical Exam  Constitutional: He appears well-developed and well-nourished. No distress.  HENT:  Head: Normocephalic and atraumatic.  Mouth/Throat: Oropharynx is clear and moist.  Eyes: Conjunctivae are normal. Pupils are equal, round, and reactive to light.  Neck: Normal range of motion. Neck supple.  Cardiovascular: Normal rate, regular rhythm and intact distal pulses.   Pulmonary/Chest: Effort normal and breath sounds normal. He has no wheezes. He has no rales.  Abdominal: Soft. Bowel sounds are normal. There is no tenderness. There is no rebound and no guarding.  Musculoskeletal: Normal range of motion.  Neurological: He is alert.  Skin: Skin is warm and dry.  2 cm laceration lateral left wrist  Psychiatric:  unable    ED Course  Procedures (including critical care time) Labs Reviewed - No data to display Dg Wrist Complete Right  07/09/2012   *RADIOLOGY REPORT*  Clinical Data: Right wrist pain post fall  RIGHT WRIST - COMPLETE 3+ VIEW  Comparison: None  Findings: Osseous demineralization. Joint spaces preserved. No acute fracture, dislocation, or bone destruction.  IMPRESSION: No acute osseous abnormalities.   Original Report Authenticated By: Ulyses Southward, M.D.   No diagnosis found.  MDM  LACERATION REPAIR Performed by: Jasmine Awe Authorized by: Jasmine Awe Consent: Verbal consent obtained. Risks and benefits: risks, benefits and alternatives were discussed Consent given by: patient Patient identity confirmed: provided demographic data Prepped and Draped in normal sterile fashion Wound explored  Laceration Location: right lateral wrist  Laceration Length: 2 cm  No Foreign Bodies seen or palpated  Anesthesia: local infiltration  Local anesthetic: lidocaine 1% with epinephrine  Anesthetic total: 4  ml  Irrigation method: syringe Amount of cleaning: extensive  Skin closure: 4.0 ethilon  Number of sutures: 6  Technique: interrupted   Patient tolerance: Patient tolerated the procedure well with no immediate complications.  No hematomas, no vascular nor tendon injuries, no  foreign bodies.  Hemostatic post procedure  Suture removal in 12 days  Jamarie Mussa Smitty Cords, MD 07/10/12 [phone]

## 2012-07-10 ENCOUNTER — Emergency Department (HOSPITAL_BASED_OUTPATIENT_CLINIC_OR_DEPARTMENT_OTHER)
Admission: EM | Admit: 2012-07-10 | Discharge: 2012-07-10 | Disposition: A | Discharge status: Home / Self Care | Payer: Medicare Other | Source: Home / Self Care | Attending: Emergency Medicine | Admitting: Emergency Medicine

## 2012-07-10 ENCOUNTER — Encounter (HOSPITAL_BASED_OUTPATIENT_CLINIC_OR_DEPARTMENT_OTHER): Payer: Self-pay | Admitting: *Deleted

## 2012-07-10 ENCOUNTER — Emergency Department (HOSPITAL_BASED_OUTPATIENT_CLINIC_OR_DEPARTMENT_OTHER): Payer: Medicare Other

## 2012-07-10 DIAGNOSIS — F259 Schizoaffective disorder, unspecified: Secondary | ICD-10-CM | POA: Insufficient documentation

## 2012-07-10 DIAGNOSIS — J4489 Other specified chronic obstructive pulmonary disease: Secondary | ICD-10-CM | POA: Insufficient documentation

## 2012-07-10 DIAGNOSIS — I10 Essential (primary) hypertension: Secondary | ICD-10-CM | POA: Insufficient documentation

## 2012-07-10 DIAGNOSIS — G8929 Other chronic pain: Secondary | ICD-10-CM | POA: Insufficient documentation

## 2012-07-10 DIAGNOSIS — Z8711 Personal history of peptic ulcer disease: Secondary | ICD-10-CM | POA: Insufficient documentation

## 2012-07-10 DIAGNOSIS — Z8674 Personal history of sudden cardiac arrest: Secondary | ICD-10-CM | POA: Insufficient documentation

## 2012-07-10 DIAGNOSIS — N401 Enlarged prostate with lower urinary tract symptoms: Secondary | ICD-10-CM | POA: Insufficient documentation

## 2012-07-10 DIAGNOSIS — F70 Mild intellectual disabilities: Secondary | ICD-10-CM | POA: Insufficient documentation

## 2012-07-10 DIAGNOSIS — S81009A Unspecified open wound, unspecified knee, initial encounter: Secondary | ICD-10-CM | POA: Insufficient documentation

## 2012-07-10 DIAGNOSIS — E119 Type 2 diabetes mellitus without complications: Secondary | ICD-10-CM | POA: Insufficient documentation

## 2012-07-10 DIAGNOSIS — E079 Disorder of thyroid, unspecified: Secondary | ICD-10-CM | POA: Insufficient documentation

## 2012-07-10 DIAGNOSIS — F6381 Intermittent explosive disorder: Secondary | ICD-10-CM | POA: Insufficient documentation

## 2012-07-10 DIAGNOSIS — Z79899 Other long term (current) drug therapy: Secondary | ICD-10-CM | POA: Insufficient documentation

## 2012-07-10 DIAGNOSIS — X789XXA Intentional self-harm by unspecified sharp object, initial encounter: Secondary | ICD-10-CM | POA: Insufficient documentation

## 2012-07-10 DIAGNOSIS — I2510 Atherosclerotic heart disease of native coronary artery without angina pectoris: Secondary | ICD-10-CM | POA: Insufficient documentation

## 2012-07-10 DIAGNOSIS — Z859 Personal history of malignant neoplasm, unspecified: Secondary | ICD-10-CM | POA: Insufficient documentation

## 2012-07-10 DIAGNOSIS — F411 Generalized anxiety disorder: Secondary | ICD-10-CM | POA: Insufficient documentation

## 2012-07-10 DIAGNOSIS — J449 Chronic obstructive pulmonary disease, unspecified: Secondary | ICD-10-CM | POA: Insufficient documentation

## 2012-07-10 DIAGNOSIS — F17200 Nicotine dependence, unspecified, uncomplicated: Secondary | ICD-10-CM | POA: Insufficient documentation

## 2012-07-10 DIAGNOSIS — I5030 Unspecified diastolic (congestive) heart failure: Secondary | ICD-10-CM | POA: Insufficient documentation

## 2012-07-10 DIAGNOSIS — Z7982 Long term (current) use of aspirin: Secondary | ICD-10-CM | POA: Insufficient documentation

## 2012-07-10 DIAGNOSIS — Z8709 Personal history of other diseases of the respiratory system: Secondary | ICD-10-CM | POA: Insufficient documentation

## 2012-07-10 DIAGNOSIS — H5460 Unqualified visual loss, one eye, unspecified: Secondary | ICD-10-CM | POA: Insufficient documentation

## 2012-07-10 DIAGNOSIS — N138 Other obstructive and reflux uropathy: Secondary | ICD-10-CM | POA: Insufficient documentation

## 2012-07-10 DIAGNOSIS — IMO0002 Reserved for concepts with insufficient information to code with codable children: Secondary | ICD-10-CM

## 2012-07-10 MED ORDER — LORAZEPAM 2 MG/ML IJ SOLN
2.0000 mg | Freq: Once | INTRAMUSCULAR | Status: AC
  Administered 2012-07-10: 2 mg via INTRAMUSCULAR
  Filled 2012-07-10: qty 1

## 2012-07-10 MED ORDER — GELATIN ABSORBABLE 12-7 MM EX MISC
CUTANEOUS | Status: AC
  Filled 2012-07-10: qty 1

## 2012-07-10 NOTE — ED Provider Notes (Signed)
History    CSN: [id number] Arrival date & time 07/10/12  7829  First MD Initiated Contact with Patient 07/10/12 1900     Chief Complaint  Patient presents with  . Laceration   (Consider location/radiation/quality/duration/timing/severity/associated sxs/prior Treatment) HPI Comments: Patient is a 57 year old male who presents today with an extremity laceration after he cut himself with glass in a suicide attempt. He has had prior suicide attempts most notably yesterday and the day before. He is currently living in a group home. He was seen at this emergency room as well as [facility] for his laceration repairs. He is a psychiatry appointment on Tuesday. The chaperone from his group home states that he always says that he would like to commit suicide to get placed in a facility. The chaperone also reports that his window has now been ordered I and all of the glass has been removed from his room. The chaperone states that he will be safe if he is to return to his group home. He has full range of motion of his lower leg, ankle, foot.   Past Medical History  Diagnosis Date  . Seizures   . Psychiatric disorder   . Schizoaffective disorder   . Organic mood disorder   . Intermittent explosive disorder   . Generalized anxiety disorder   . Mild mental retardation   . COPD (chronic obstructive pulmonary disease)   . Peptic ulcer   . Thyroid disease   . Prostate hyperplasia with urinary obstruction   . Blind left eye   . Chronic respiratory failure   . Cancer   . Diabetes mellitus   . Heart attack   . Diagnosis unknown     pt poor historian unable to give information  . Anxiety   . Diastolic CHF 09/04/2011  . Hypertension   . Coronary artery disease   . Chronic chest pain    Past Surgical History  Procedure Laterality Date  . Hip pinning,cannulated  04/11/2011    Procedure: CANNULATED HIP PINNING;  Surgeon: Mable Paris, MD;  Location: [facility] OR;  Service: Orthopedics;   Laterality: Left;  Perc. Screw Fixation/Left  . Fracture surgery      cannulated hip pinning 04/11/11   Family History  Problem Relation Age of Onset  . Hypertension     History  Substance Use Topics  . Smoking status: Current Every Day Smoker -- 0.25 packs/day for 50 years    Types: Cigarettes  . Smokeless tobacco: Not on file  . Alcohol Use: 1.8 oz/week    3 Cans of beer per week     Comment: 3 beers per day per person    Review of Systems  Unable to perform ROS: Psychiatric disorder    Allergies  Peanuts; Haldol; Other; Prolixin; Thorazine; and Lactose intolerance (gi)  Home Medications   Current Outpatient Rx  Name  Route  Sig  Dispense  Refill  . albuterol (PROVENTIL HFA;VENTOLIN HFA) 108 (90 BASE) MCG/ACT inhaler   Inhalation   Inhale 2 puffs into the lungs every 6 (six) hours as needed for wheezing.   1 Inhaler   2   . amLODipine (NORVASC) 10 MG tablet   Oral   Take 10 mg by mouth daily.         Marland Kitchen amLODipine (NORVASC) 5 MG tablet   Oral   Take 1 tablet (5 mg total) by mouth daily.   30 tablet   0   . aspirin 81 MG tablet   Oral  Take 81 mg by mouth at bedtime.         Marland Kitchen aspirin EC 325 MG EC tablet   Oral   Take 1 tablet (325 mg total) by mouth daily.   30 tablet      . bimatoprost (LUMIGAN) 0.01 % SOLN   Both Eyes   Place 1 drop into both eyes at bedtime.          . carvedilol (COREG) 3.125 MG tablet   Oral   Take 3.125 mg by mouth 2 (two) times daily with a meal.         . digoxin (LANOXIN) 0.125 MG tablet   Oral   Take 0.125 mg by mouth daily.         . furosemide (LASIX) 40 MG tablet   Oral   Take 1 tablet (40 mg total) by mouth daily.   30 tablet   0   . ipratropium-albuterol (DUONEB) 0.5-2.5 (3) MG/3ML SOLN   Nebulization   Take 3 mLs by nebulization every 6 (six) hours as needed.         . isosorbide mononitrate (IMDUR) 30 MG 24 hr tablet   Oral   Take 1 tablet (30 mg total) by mouth daily.   30 tablet   0    . levothyroxine (SYNTHROID, LEVOTHROID) 75 MCG tablet   Oral   Take 75 mcg by mouth every morning.          Marland Kitchen lisinopril (PRINIVIL,ZESTRIL) 5 MG tablet   Oral   Take 1 tablet (5 mg total) by mouth daily.   30 tablet   0   . lithium carbonate 300 MG capsule   Oral   Take 300 mg by mouth 3 (three) times daily with meals. Takes 0900, 1600 & 2000         . loratadine (CLARITIN) 10 MG tablet   Oral   Take 10 mg by mouth daily.         Marland Kitchen losartan-hydrochlorothiazide (HYZAAR) 50-12.5 MG per tablet   Oral   Take 1 tablet by mouth daily.         . risperiDONE (RISPERDAL) 1 MG tablet   Oral   Take 1 mg by mouth every morning.          . risperiDONE (RISPERDAL) 2 MG tablet   Oral   Take 2 mg by mouth daily at 8 pm.          . Tamsulosin HCl (FLOMAX) 0.4 MG CAPS   Oral   Take 0.4 mg by mouth daily after supper.         . temazepam (RESTORIL) 30 MG capsule   Oral   Take 30 mg by mouth every evening.         . traZODone (DESYREL) 100 MG tablet   Oral   Take 100 mg by mouth at bedtime.            BP 137/107  Pulse 69  Temp(Src) 97.6 F (36.4 C)  Resp 18  SpO2 96% Physical Exam  Nursing note and vitals reviewed. Constitutional: He is oriented to person, place, and time. He appears well-developed and well-nourished. No distress.  HENT:  Head: Normocephalic and atraumatic.  Right Ear: External ear normal.  Left Ear: External ear normal.  Nose: Nose normal.  Eyes: Conjunctivae are normal.  Neck: Normal range of motion. No tracheal deviation present.  Cardiovascular: Normal rate, regular rhythm and normal heart sounds.   Pulmonary/Chest: Effort normal and  breath sounds normal. No stridor.  Abdominal: Soft. He exhibits no distension. There is no tenderness.  Musculoskeletal: Normal range of motion.  Neurological: He is alert and oriented to person, place, and time.  Skin: Skin is warm and dry. He is not diaphoretic.  4 cm laceration on left lower leg  anteriorly Neurovascularly intact  Psychiatric: He has a normal mood and affect. His behavior is normal.    ED Course  Procedures (including critical care time)  LACERATION REPAIR Performed by: Junious Silk Authorized by: Junious Silk Consent: Verbal consent obtained. Risks and benefits: risks, benefits and alternatives were discussed Consent given by: patient Patient identity confirmed: provided demographic data Prepped and Draped in normal sterile fashion Wound explored  Laceration Location: left lower leg anteriorly  Laceration Length: 4 cm  No Foreign Bodies seen or palpated  Anesthesia: local infiltration  Local anesthetic: lidocaine 2%   Anesthetic total: 3 ml  Irrigation method: syringe Amount of cleaning: standard  Skin closure: 4-0 Ethilon  Number of sutures: 4  Technique: vertical mattress  Patient tolerance: Patient tolerated the procedure well with no immediate complications.   Labs Reviewed - No data to display Dg Forearm Left  07/10/2012   *RADIOLOGY REPORT*  Clinical Data: Left forearm laceration  LEFT FOREARM - 2 VIEW  Comparison: None.  Findings: No bony abnormality.  No fracture, subluxation or dislocation.  No radiopaque foreign bodies.  IMPRESSION: No fracture or radiopaque foreign body.   Original Report Authenticated By: Charlett Nose, M.D.   Dg Wrist Complete Right  07/09/2012   *RADIOLOGY REPORT*  Clinical Data: Right wrist pain post fall  RIGHT WRIST - COMPLETE 3+ VIEW  Comparison: None  Findings: Osseous demineralization. Joint spaces preserved. No acute fracture, dislocation, or bone destruction.  IMPRESSION: No acute osseous abnormalities.   Original Report Authenticated By: Ulyses Southward, M.D.   1. Laceration     MDM  Patient presents with a laceration to his left lower leg which was successfully repaired without complication. His tetanus shot is up to date. His chaperone contracts for safety. He has a psychiatry appointment on Tuesday.  Dr. Bebe Shaggy evaluated patient and agrees with plan. Return instructions given. Vital signs stable for discharge. Patient / Family / Caregiver informed of clinical course, understand medical decision-making process, and agree with plan.   Mora Bellman, PA-C 07/11/12 1450

## 2012-07-10 NOTE — ED Notes (Signed)
Hannah, PA-C at  bedside.

## 2012-07-10 NOTE — ED Notes (Signed)
Call placed to [facility]. Social worker will call back.

## 2012-07-10 NOTE — ED Notes (Signed)
Nonnie Done, caregiver from group home, at patient's bedside advised that a report was made to [facility] and they would be in touch with him tomorrow.
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
## 2012-07-10 NOTE — ED Notes (Signed)
Patient transported to X-ray 

## 2012-07-10 NOTE — ED Notes (Signed)
Pt is from group home seen here last night for  Laceration to left wrist, back today for laceration to left leg by glass

## 2012-07-10 NOTE — ED Notes (Signed)
I spoke with Billey Gosling from [facility] Child [facility] services who took report on this patient concerning the frequent recent ER visits. He stated that he would place the report with the Adult Protective Services and they would follow up with the group home tomorrow.

## 2012-07-10 NOTE — ED Notes (Signed)
Leg lac covered with gauze and wrapped with kling.

## 2012-07-12 NOTE — ED Provider Notes (Signed)
Medical screening examination/treatment/procedure(s) were conducted as a shared visit with non-physician practitioner(s) and myself.  I personally evaluated the patient during the encounter  Pt stable in the ED, though he did have some agitation Nurse called Adult protective services since he has had multiple ED visits Chaperone from home reports pt is essentially supervised at all times.  At this time, appears safe for him to be discharged  Joya Gaskins, MD 07/12/12 1538

## 2012-07-17 ENCOUNTER — Emergency Department (HOSPITAL_COMMUNITY)
Admission: EM | Admit: 2012-07-17 | Discharge: 2012-07-18 | Disposition: A | Discharge status: Home / Self Care | Payer: Medicare Other | Attending: Dermatology | Admitting: Dermatology

## 2012-07-17 ENCOUNTER — Emergency Department (HOSPITAL_COMMUNITY): Payer: Medicare Other

## 2012-07-17 ENCOUNTER — Encounter (HOSPITAL_COMMUNITY): Payer: Self-pay | Admitting: *Deleted

## 2012-07-17 DIAGNOSIS — Z8679 Personal history of other diseases of the circulatory system: Secondary | ICD-10-CM | POA: Insufficient documentation

## 2012-07-17 DIAGNOSIS — H5460 Unqualified visual loss, one eye, unspecified: Secondary | ICD-10-CM | POA: Insufficient documentation

## 2012-07-17 DIAGNOSIS — F259 Schizoaffective disorder, unspecified: Secondary | ICD-10-CM | POA: Insufficient documentation

## 2012-07-17 DIAGNOSIS — X789XXA Intentional self-harm by unspecified sharp object, initial encounter: Secondary | ICD-10-CM | POA: Insufficient documentation

## 2012-07-17 DIAGNOSIS — F411 Generalized anxiety disorder: Secondary | ICD-10-CM | POA: Insufficient documentation

## 2012-07-17 DIAGNOSIS — S61411A Laceration without foreign body of right hand, initial encounter: Secondary | ICD-10-CM

## 2012-07-17 DIAGNOSIS — G8929 Other chronic pain: Secondary | ICD-10-CM | POA: Insufficient documentation

## 2012-07-17 DIAGNOSIS — J9610 Chronic respiratory failure, unspecified whether with hypoxia or hypercapnia: Secondary | ICD-10-CM | POA: Insufficient documentation

## 2012-07-17 DIAGNOSIS — E079 Disorder of thyroid, unspecified: Secondary | ICD-10-CM | POA: Insufficient documentation

## 2012-07-17 DIAGNOSIS — I10 Essential (primary) hypertension: Secondary | ICD-10-CM | POA: Insufficient documentation

## 2012-07-17 DIAGNOSIS — F919 Conduct disorder, unspecified: Secondary | ICD-10-CM | POA: Insufficient documentation

## 2012-07-17 DIAGNOSIS — Z859 Personal history of malignant neoplasm, unspecified: Secondary | ICD-10-CM | POA: Insufficient documentation

## 2012-07-17 DIAGNOSIS — F70 Mild intellectual disabilities: Secondary | ICD-10-CM | POA: Insufficient documentation

## 2012-07-17 DIAGNOSIS — I5030 Unspecified diastolic (congestive) heart failure: Secondary | ICD-10-CM | POA: Insufficient documentation

## 2012-07-17 DIAGNOSIS — F489 Nonpsychotic mental disorder, unspecified: Secondary | ICD-10-CM | POA: Insufficient documentation

## 2012-07-17 DIAGNOSIS — IMO0002 Reserved for concepts with insufficient information to code with codable children: Secondary | ICD-10-CM

## 2012-07-17 DIAGNOSIS — E119 Type 2 diabetes mellitus without complications: Secondary | ICD-10-CM | POA: Insufficient documentation

## 2012-07-17 DIAGNOSIS — R079 Chest pain, unspecified: Secondary | ICD-10-CM | POA: Insufficient documentation

## 2012-07-17 DIAGNOSIS — N138 Other obstructive and reflux uropathy: Secondary | ICD-10-CM | POA: Insufficient documentation

## 2012-07-17 DIAGNOSIS — F0630 Mood disorder due to known physiological condition, unspecified: Secondary | ICD-10-CM | POA: Insufficient documentation

## 2012-07-17 DIAGNOSIS — S60551A Superficial foreign body of right hand, initial encounter: Secondary | ICD-10-CM

## 2012-07-17 DIAGNOSIS — N401 Enlarged prostate with lower urinary tract symptoms: Secondary | ICD-10-CM | POA: Insufficient documentation

## 2012-07-17 DIAGNOSIS — F17200 Nicotine dependence, unspecified, uncomplicated: Secondary | ICD-10-CM | POA: Insufficient documentation

## 2012-07-17 DIAGNOSIS — F6381 Intermittent explosive disorder: Secondary | ICD-10-CM | POA: Insufficient documentation

## 2012-07-17 DIAGNOSIS — Z7982 Long term (current) use of aspirin: Secondary | ICD-10-CM | POA: Insufficient documentation

## 2012-07-17 DIAGNOSIS — I2510 Atherosclerotic heart disease of native coronary artery without angina pectoris: Secondary | ICD-10-CM | POA: Insufficient documentation

## 2012-07-17 DIAGNOSIS — N139 Obstructive and reflux uropathy, unspecified: Secondary | ICD-10-CM | POA: Insufficient documentation

## 2012-07-17 DIAGNOSIS — J449 Chronic obstructive pulmonary disease, unspecified: Secondary | ICD-10-CM

## 2012-07-17 DIAGNOSIS — J441 Chronic obstructive pulmonary disease with (acute) exacerbation: Secondary | ICD-10-CM | POA: Insufficient documentation

## 2012-07-17 DIAGNOSIS — Z79899 Other long term (current) drug therapy: Secondary | ICD-10-CM | POA: Insufficient documentation

## 2012-07-17 DIAGNOSIS — Z8711 Personal history of peptic ulcer disease: Secondary | ICD-10-CM | POA: Insufficient documentation

## 2012-07-17 DIAGNOSIS — Z8669 Personal history of other diseases of the nervous system and sense organs: Secondary | ICD-10-CM | POA: Insufficient documentation

## 2012-07-17 DIAGNOSIS — S61209A Unspecified open wound of unspecified finger without damage to nail, initial encounter: Secondary | ICD-10-CM | POA: Insufficient documentation

## 2012-07-17 MED ORDER — PREDNISONE 20 MG PO TABS: 60.0000 mg | ORAL_TABLET | Freq: Every day | ORAL | Status: AC

## 2012-07-17 MED ORDER — ALBUTEROL SULFATE HFA 108 (90 BASE) MCG/ACT IN AERS: 2.0000 | INHALATION_SPRAY | RESPIRATORY_TRACT | Status: AC | PRN

## 2012-07-17 MED ORDER — IPRATROPIUM BROMIDE 0.02 % IN SOLN
0.5000 mg | Freq: Once | RESPIRATORY_TRACT | Status: AC
  Administered 2012-07-17: 0.5 mg via RESPIRATORY_TRACT
  Filled 2012-07-17: qty 2.5

## 2012-07-17 MED ORDER — ALBUTEROL SULFATE (5 MG/ML) 0.5% IN NEBU
5.0000 mg | INHALATION_SOLUTION | Freq: Once | RESPIRATORY_TRACT | Status: AC
  Administered 2012-07-17: 5 mg via RESPIRATORY_TRACT
  Filled 2012-07-17: qty 1

## 2012-07-17 NOTE — ED Notes (Signed)
EMS called to home.  Found patient sitting on side of tub bleeding from lacerations to right hand and elbow.  Patient also states that he wants to die.

## 2012-07-17 NOTE — ED Provider Notes (Signed)
History    CSN: [id number] Arrival date & time 07/17/12  2017  First MD Initiated Contact with Patient 07/17/12 2032     Chief Complaint  Patient presents with  . Respiratory Distress  . Suicidal   (Consider location/radiation/quality/duration/timing/severity/associated sxs/prior Treatment) The history is provided by the patient and the EMS personnel. The history is limited by the condition of the patient.  pt with hx schizoaffective disorder, mood disorder, MR, anxiety, presents from group home.  Pt had become agitated and punched window.  Small superificial cut to 5th finger right hand. Pain mild, constant, dull, non radiating. Pt also has hx copd, and ems noted wheezing on their arrival.  Pt denies cp. Denies cough. No fever or chills. +smoker. Pt is currently calm and denies thoughts of harm to self or others. Tetanus utd.     Past Medical History  Diagnosis Date  . Seizures   . Psychiatric disorder   . Schizoaffective disorder   . Organic mood disorder   . Intermittent explosive disorder   . Generalized anxiety disorder   . Mild mental retardation   . COPD (chronic obstructive pulmonary disease)   . Peptic ulcer   . Thyroid disease   . Prostate hyperplasia with urinary obstruction   . Blind left eye   . Chronic respiratory failure   . Cancer   . Diabetes mellitus   . Heart attack   . Diagnosis unknown     pt poor historian unable to give information  . Anxiety   . Diastolic CHF 09/04/2011  . Hypertension   . Coronary artery disease   . Chronic chest pain    Past Surgical History  Procedure Laterality Date  . Hip pinning,cannulated  04/11/2011    Procedure: CANNULATED HIP PINNING;  Surgeon: Mable Paris, MD;  Location: [facility] OR;  Service: Orthopedics;  Laterality: Left;  Perc. Screw Fixation/Left  . Fracture surgery      cannulated hip pinning 04/11/11   Family History  Problem Relation Age of Onset  . Hypertension     History  Substance Use Topics   . Smoking status: Current Every Day Smoker -- 0.25 packs/day for 50 years    Types: Cigarettes  . Smokeless tobacco: Not on file  . Alcohol Use: 1.8 oz/week    3 Cans of beer per week     Comment: 3 beers per day per person    Review of Systems  Constitutional: Negative for fever.  HENT: Negative for neck pain.   Eyes: Negative for redness.  Respiratory: Positive for wheezing.   Cardiovascular: Negative for chest pain.  Gastrointestinal: Negative for abdominal pain.  Genitourinary: Negative for flank pain.  Musculoskeletal: Negative for back pain.  Skin: Positive for wound.  Neurological: Negative for headaches.  Hematological: Does not bruise/bleed easily.  Psychiatric/Behavioral: The patient is nervous/anxious.     Allergies  Peanuts; Haldol; Other; Prolixin; Thorazine; and Lactose intolerance (gi)  Home Medications   Current Outpatient Rx  Name  Route  Sig  Dispense  Refill  . albuterol (PROVENTIL HFA;VENTOLIN HFA) 108 (90 BASE) MCG/ACT inhaler   Inhalation   Inhale 2 puffs into the lungs every 6 (six) hours as needed for wheezing.   1 Inhaler   2   . amLODipine (NORVASC) 10 MG tablet   Oral   Take 10 mg by mouth daily.         Marland Kitchen aspirin 81 MG tablet   Oral   Take 81 mg by mouth at  bedtime.         . bimatoprost (LUMIGAN) 0.01 % SOLN   Both Eyes   Place 1 drop into both eyes at bedtime.          . carvedilol (COREG) 3.125 MG tablet   Oral   Take 3.125 mg by mouth 2 (two) times daily with a meal.         . digoxin (LANOXIN) 0.125 MG tablet   Oral   Take 0.125 mg by mouth daily.         Marland Kitchen ipratropium-albuterol (DUONEB) 0.5-2.5 (3) MG/3ML SOLN   Nebulization   Take 3 mLs by nebulization every 6 (six) hours as needed.         Marland Kitchen levothyroxine (SYNTHROID, LEVOTHROID) 75 MCG tablet   Oral   Take 75 mcg by mouth every morning.          . lithium carbonate 300 MG capsule   Oral   Take 300 mg by mouth 3 (three) times daily with meals. Takes  0900, 1600 & 2000         . loratadine (CLARITIN) 10 MG tablet   Oral   Take 10 mg by mouth daily.         Marland Kitchen losartan-hydrochlorothiazide (HYZAAR) 50-12.5 MG per tablet   Oral   Take 1 tablet by mouth daily.         . risperiDONE (RISPERDAL) 1 MG tablet   Oral   Take 1 mg by mouth every morning.          . risperiDONE (RISPERDAL) 2 MG tablet   Oral   Take 2 mg by mouth daily at 8 pm.          . Tamsulosin HCl (FLOMAX) 0.4 MG CAPS   Oral   Take 0.4 mg by mouth daily after supper.         . traZODone (DESYREL) 100 MG tablet   Oral   Take 100 mg by mouth at bedtime.            BP 160/95  Pulse 61  Temp(Src) 98.1 F (36.7 C) (Oral)  Resp 20  SpO2 100% Physical Exam  Nursing note and vitals reviewed. Constitutional: He appears well-developed and well-nourished. No distress.  HENT:  Head: Atraumatic.  Eyes: Pupils are equal, round, and reactive to light.  Neck: Neck supple. No tracheal deviation present.  Cardiovascular: Normal rate.   Pulmonary/Chest: Effort normal. No accessory muscle usage. No respiratory distress. He has wheezes.  Abdominal: Soft. He exhibits no distension. There is no tenderness.  Musculoskeletal: Normal range of motion.  Laceration to lateral aspect right hand and small finger. Normal movement, flex/ext of all digits. Normal cap refill distally. Good rom bil ext, no other focal bony tenderness noted.   Neurological: He is alert.  Alert, content. Motor intact bil.   Skin: Skin is warm and dry.  Psychiatric:  Alert, content. Mildly anxious. No SI/HI. No delusions or hallucinations.     ED Course  Procedures (including critical care time)   MDM  Pt alert, content. Wheezing improved w neb tx.  Reviewed nursing notes and prior charts for additional history.   Recheck wheezing bil.  Alb and atrovent neb (pt already recd initial neb and solumedrol via ems).  LACERATION REPAIR Performed by: Suzi Roots Authorized by:  Suzi Roots Consent: Verbal consent obtained. Risks and benefits: risks, benefits and alternatives were discussed Consent given by: patient Patient identity confirmed: provided demographic data Prepped and Draped  in normal sterile fashion Wound explored  Laceration Location: right hand  Laceration Length: 3 cm  Small fb removed from wound lateral to head 5th mc, same size and shape as that visualized on xray. No other fbs seen or felt.    Anesthesia: local infiltration  Local anesthetic: lidocaine 2% wo epinephrine  Anesthetic total: 3 ml  Irrigation method: syringe Amount of cleaning: standard  Skin closure: 4-0 nylon  Number of sutures: 4  Technique: simple interrupted  Patient tolerance: Patient tolerated the procedure well with no immediate complications.   Sterile dressing.  Xr.  Recheck normal mood and affect.  Pt repetitively asking for coffee and sandwiches.   No SI.  Calm and alert.  Recheck chest cta. No increased wob.     Suzi Roots, MD 07/17/12 2245

## 2012-07-18 MED ORDER — LORAZEPAM 2 MG/ML IJ SOLN
0.5000 mg | Freq: Once | INTRAMUSCULAR | Status: AC
  Administered 2012-07-18: 0.5 mg via INTRAMUSCULAR

## 2012-07-18 MED ORDER — LORAZEPAM 2 MG/ML IJ SOLN
INTRAMUSCULAR | Status: AC
  Filled 2012-07-18: qty 1

## 2012-07-18 MED ORDER — ACETAMINOPHEN 325 MG PO TABS
650.0000 mg | ORAL_TABLET | ORAL | Status: DC | PRN
  Administered 2012-07-18: 650 mg via ORAL
  Filled 2012-07-18: qty 2

## 2012-07-18 NOTE — ED Notes (Signed)
Ranae Palms, MD, made aware that patient states he is "having a stroke." Ranae Palms states that patient's current presentation is not consistent with CVA.

## 2012-07-18 NOTE — ED Notes (Signed)
Patient was returned via PT[facility] twice.  After contacting patient primary care giver the patient was notified that [employer] would be riding with him on his return to his group home.

## 2012-07-18 NOTE — Progress Notes (Signed)
Patient is set to discharge back to Group Home today. Group home supervisor, Nonnie Done (ph#: [phone]) aware & will pick-up @ 4:00pm today. RN, Dorene Grebe made aware.  Unice Bailey, LCSW  [facility]  Clinical Social Worker  cell #: [phone]

## 2012-07-18 NOTE — ED Provider Notes (Signed)
Discharged via PTAR, initially brought back as was combative in vehicle and then sent back.  Reportedly group home has no power and now will need to be seen by social work for home issues.    Jasmine Awe, MD 07/18/12 [phone]

## 2012-11-01 DEATH — deceased

## 2012-12-29 ENCOUNTER — Emergency Department (HOSPITAL_COMMUNITY)
Admission: EM | Admit: 2012-12-29 | Discharge: 2013-01-01 | Disposition: E | Payer: Medicare Other | Attending: Emergency Medicine | Admitting: Emergency Medicine

## 2012-12-29 DIAGNOSIS — Y929 Unspecified place or not applicable: Secondary | ICD-10-CM | POA: Diagnosis not present

## 2012-12-29 DIAGNOSIS — X58XXXA Exposure to other specified factors, initial encounter: Secondary | ICD-10-CM | POA: Diagnosis not present

## 2012-12-29 DIAGNOSIS — S20219A Contusion of unspecified front wall of thorax, initial encounter: Secondary | ICD-10-CM | POA: Diagnosis not present

## 2012-12-29 DIAGNOSIS — I469 Cardiac arrest, cause unspecified: Secondary | ICD-10-CM | POA: Insufficient documentation

## 2012-12-29 DIAGNOSIS — Y939 Activity, unspecified: Secondary | ICD-10-CM | POA: Diagnosis not present

## 2012-12-29 DIAGNOSIS — S40029A Contusion of unspecified upper arm, initial encounter: Secondary | ICD-10-CM | POA: Diagnosis not present

## 2012-12-29 MED ORDER — EPINEPHRINE HCL 0.1 MG/ML IJ SOSY
PREFILLED_SYRINGE | INTRAMUSCULAR | Status: AC | PRN
  Administered 2012-12-29 (×2): 1 via INTRAVENOUS

## 2012-12-29 MED ORDER — SODIUM BICARBONATE 8.4 % IV SOLN
INTRAVENOUS | Status: AC | PRN
  Administered 2012-12-29: 50 meq via INTRAVENOUS

## 2012-12-29 MED FILL — Medication: Qty: 1 | Status: AC

## 2012-12-29 NOTE — ED Notes (Signed)
Per EMS - pt was last seen normal approx 1925, pt was found unresponsive by group home staff, pt w/ hx of suicide attempt by OD on pills, pt has been requesting to go to the hospital today. EMS reports pt found in v-fib by fire dept and was shocked x1 on their arrival to scene, pt has been given x8 epi, x1 sodium bicarb , x4 narcan, and x1 calcium en route w/ continued CPR - EMS reports pulses returned for approx however has not been able to feel a pulse since, pt reported to be in PEA for EMS, pupils have been fixed and dilated since EMS arrival on scene.

## 2012-12-29 NOTE — ED Provider Notes (Addendum)
I saw and evaluated the patient, reviewed the resident's note and I agree with the findings and plan.  EKG Interpretation   None       Patient presents with CPR in progress. Last seen normal >1 hr ago. Found unresponsive by group home staff. Per EMS, patient has a history of multiple hospital visits and suicide attempts. He has a history of schizophrenia. Patient was found with "bottles on the floor." Patient received 8 doses of epinephrine, one dose of bicarbonate, multiple doses of Narcan, and calcium in route. He was noted upon EMS arrival to have a shockable rhythm on an AED. Since that time he has been asystolic.    Patient was given an additional 2 doses of epinephrine and 10 more minutes of CPR. Given patient's poor neurologic exam and ACLS for over 1 hour, patient was pronounced dead. ME has accepted the patient.  Shon Baton, MD 12/01/2012 2356  Bedside [place] performed by resident showed no cardiac activity.  Shon Baton, MD 01/12/13 [phone]

## 2012-12-29 NOTE — ED Provider Notes (Addendum)
CSN: [id number]     Arrival date & time 14-Jan-2013  2034 History   First MD Initiated Contact with Patient 2013/01/14 2045     Chief Complaint  Patient presents with  . Cardiac Arrest   HPI  Pt presents via EMS with CPR in progress. Last seen normal at approximately 19:25. Found unresponsive by staff. Per EMS, the patient has multiple previous hospital visits and suicide attempts. Per EMS, the patient was found with multiple pills on the ground. The patient was noted to have bruising on his chest and left upper arm. He was found by fire to be in V-fib. He was shocked once prior to EMS arrival. When EMS arrived the patient was in PEA. The patient was intubated by EMS. He received multiple doses of epi x 8, sodium bicarb, narcan x4, and calcium x 1. EMS states that in route the patient had a return of pulse for approximately 2 minutes prior to returning to PEA arrest. EMS also states the patient had been asking throughout the day to go to the hospital.   No past medical history on file. No past surgical history on file. No family history on file. History  Substance Use Topics  . Smoking status: Not on file  . Smokeless tobacco: Not on file  . Alcohol Use: Not on file    Review of Systems  Unable to perform ROS: Acuity of condition   Allergies  Review of patient's allergies indicates not on file.  Home Medications  No current outpatient prescriptions on file. Wt 160 lb (72.576 kg) Physical Exam  Constitutional: He appears well-developed.  ETT in place at 25 at teeth.   HENT:  Head: Normocephalic and atraumatic.  Eyes:  8 mm fixed and dilated bilaterally  Neck: Neck supple.  Cardiovascular:  No palpable central pulse  Pulmonary/Chest:  Intubated. Bilateral breath sounds  Abdominal: Soft. He exhibits no distension.  Musculoskeletal:  bruising to left upper arm otherwise extremities are grossly atraumatic.   Neurological: He is unresponsive.  Skin: Skin is warm.  Ecchymosis to  right lateral chest and left lateral chest    ED Course  Procedures (including critical care time) Labs Review Labs Reviewed - No data to display Imaging Review No results found.  EKG Interpretation   None      MDM  1. Cardiac Arrest Pt presents via EMS with CPR in progress. On arrival, rhythm was asystolic and he was pulseless. CPR and ACLS was continued. ETT placement confirmed with ETCO2 and bilateral breath sounds on exam. Despite efforts we were unable to obtain ROSC. At 20:43 the patient had been without a perfusing rhythm for approximately 1 hour and remained asystolic. No palpable pulses. Bedside US with no cardiac activity. TOD called at 20:43. Given reported history of suicide attempts and pills found on ground at scene the medical examiner was contacted and will review the case in the AM.     Shanon Ace, MD 12/30/12 [phone]

## 2012-12-29 NOTE — Progress Notes (Signed)
Chaplain responded to CPR in ED.  Pt's caretaker was present.  Chaplain promoted communication sharing between caretaker and medical team.  Pt expired, and afterwards chaplain provided acute pastoral ministry as well as grief and emotional support.  The caretaker seemed anxious about the pt's death but chaplain provided some information for next steps.  Caretaker seemed grateful for the chaplain presence.

## 2012-12-30 NOTE — ED Provider Notes (Signed)
I saw and evaluated the patient, reviewed the resident's note and I agree with the findings and plan.  EKG Interpretation   None       See separate note.  Shon Baton, MD 12/30/12 [phone]

## 2012-12-30 NOTE — ED Provider Notes (Signed)
I saw and evaluated the patient, reviewed the resident's note and I agree with the findings and plan.  EKG Interpretation   None       See additional note.  Shon Baton, MD 12/30/12 [phone]

## 2013-01-01 DEATH — deceased

## 2013-11-02 IMAGING — CR DG CHEST 1V PORT
1 series · 2 of 2 positions shown · non-contrast
Comparison: 01/29/2011

CLINICAL DATA: Severe shortness of breath.

PORTABLE CHEST - 1 VIEW

[Series 1: AP · U · 2 of 2 slices shown]
[im 1/2]
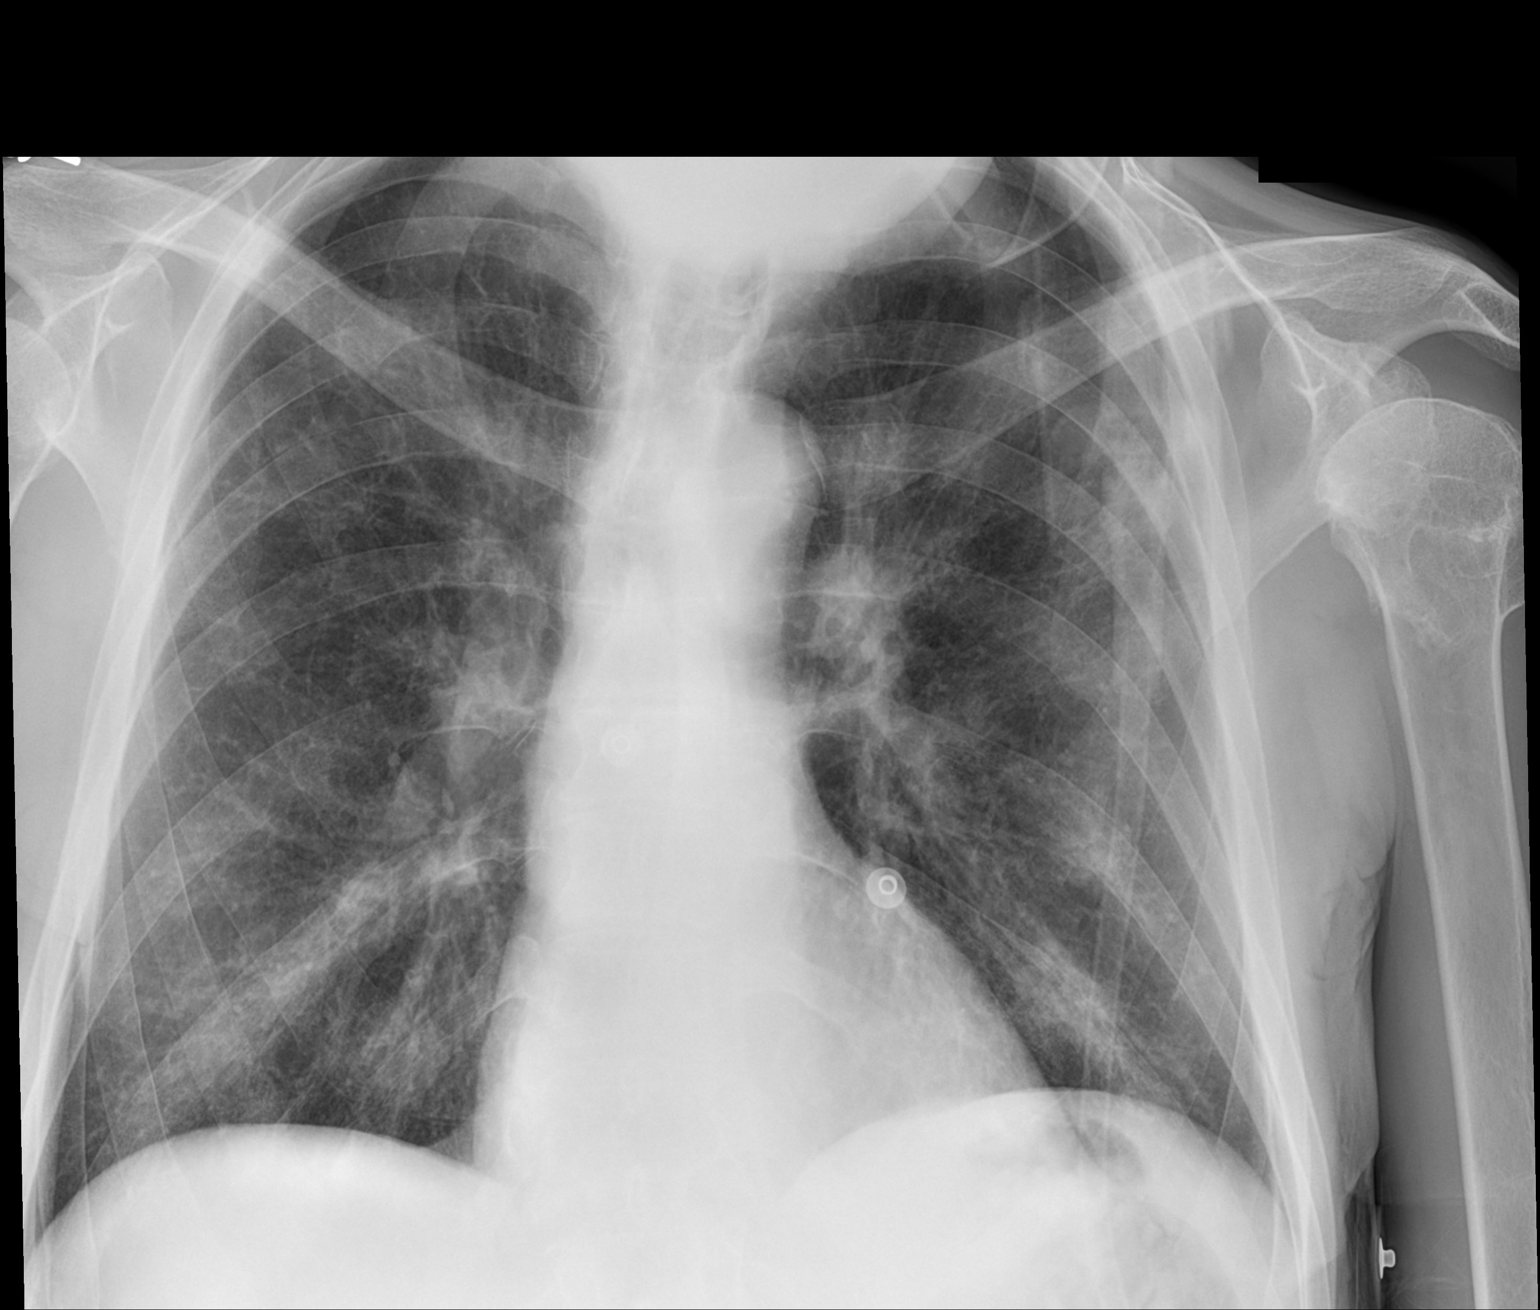
[im 2/2]
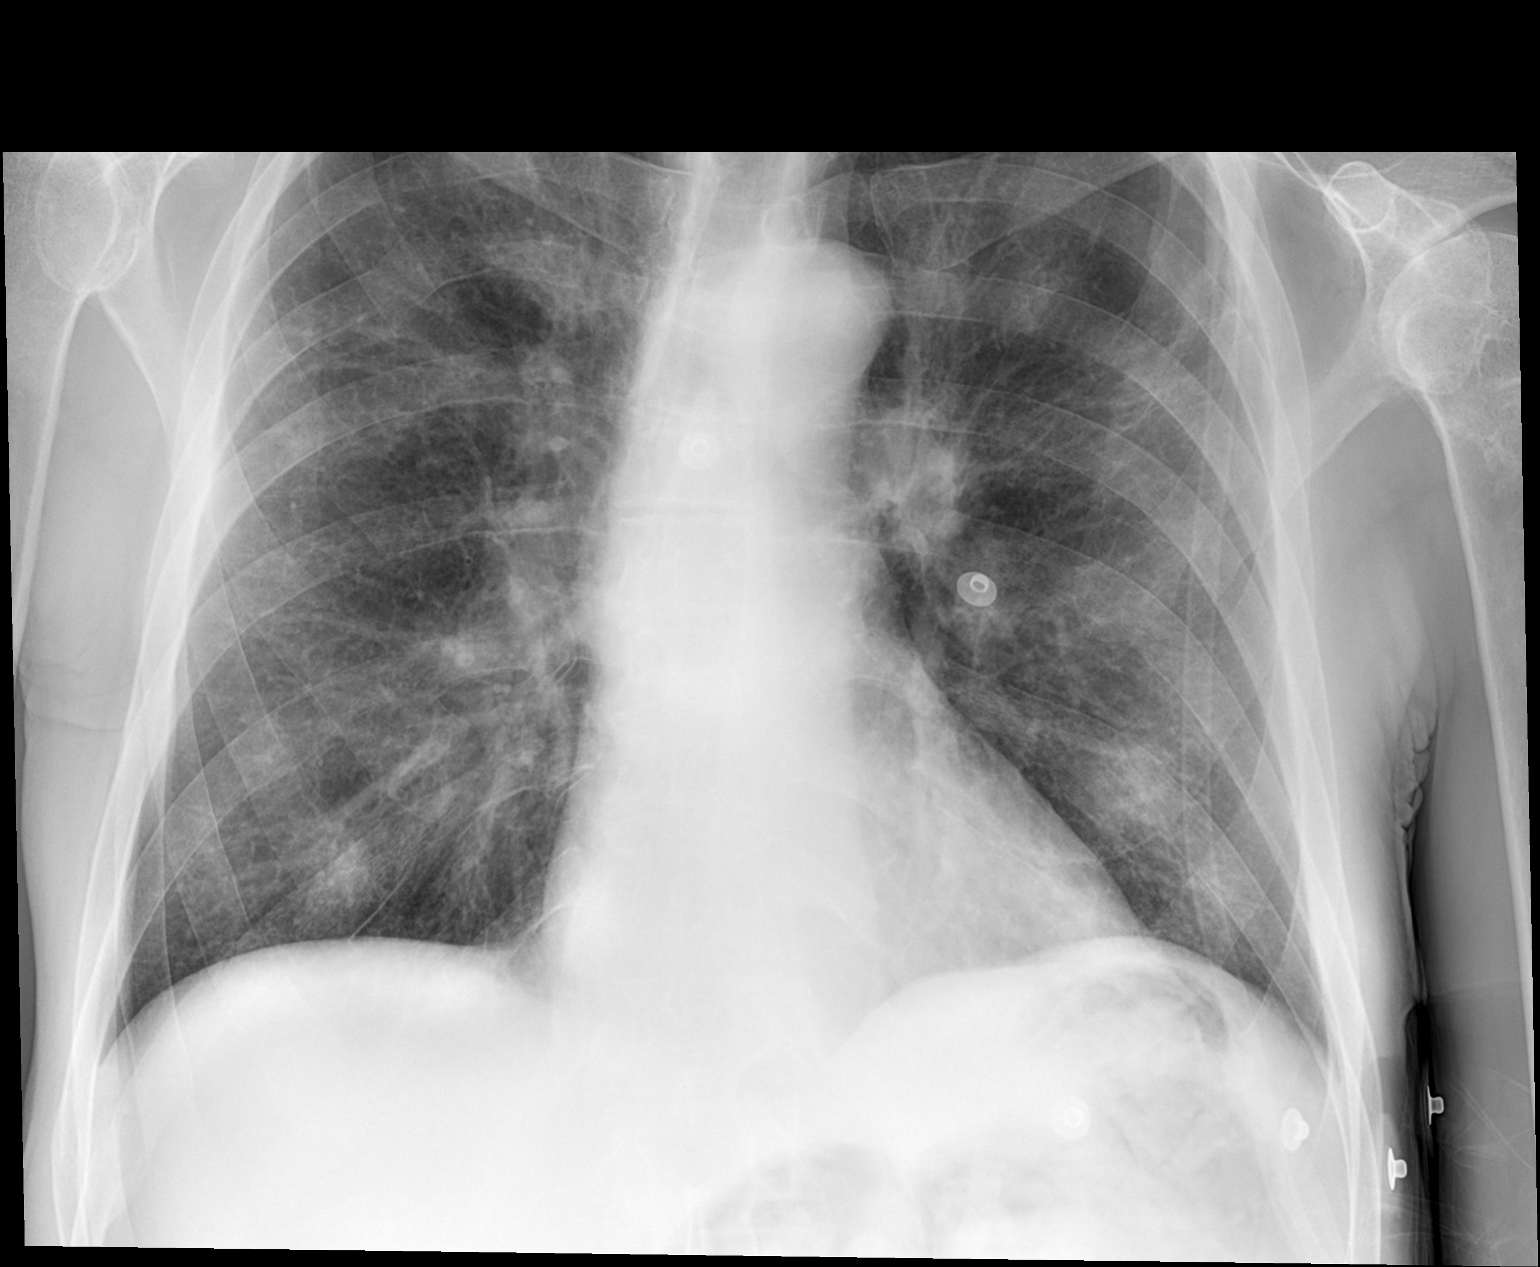

[2 of 2 positions shown; findings below may reference images not displayed]

FINDINGS: Shallow inspiration.  Heart size and pulmonary
vascularity are normal.  Diffuse pulmonary fibrosis and linear
scarring again demonstrated without significant change.  No focal
airspace consolidation.  Scattered calcified granulomas in the
lungs.  No blunting of costophrenic angles.  No pneumothorax.
Degenerative changes in the spine and shoulders.  Old right rib
fractures.  No significant change since previous study.
IMPRESSION: Chronic-appearing interstitial fibrosis in the lungs.  No
superimposed airspace consolidation.  Emphysematous changes.

## 2013-12-04 IMAGING — CR DG CHEST 2V
2 series · 2 of 2 positions shown · non-contrast
Comparison: 02/25/2011

CLINICAL DATA: Shortness of breath

CHEST - 2 VIEW

[w chest pa]
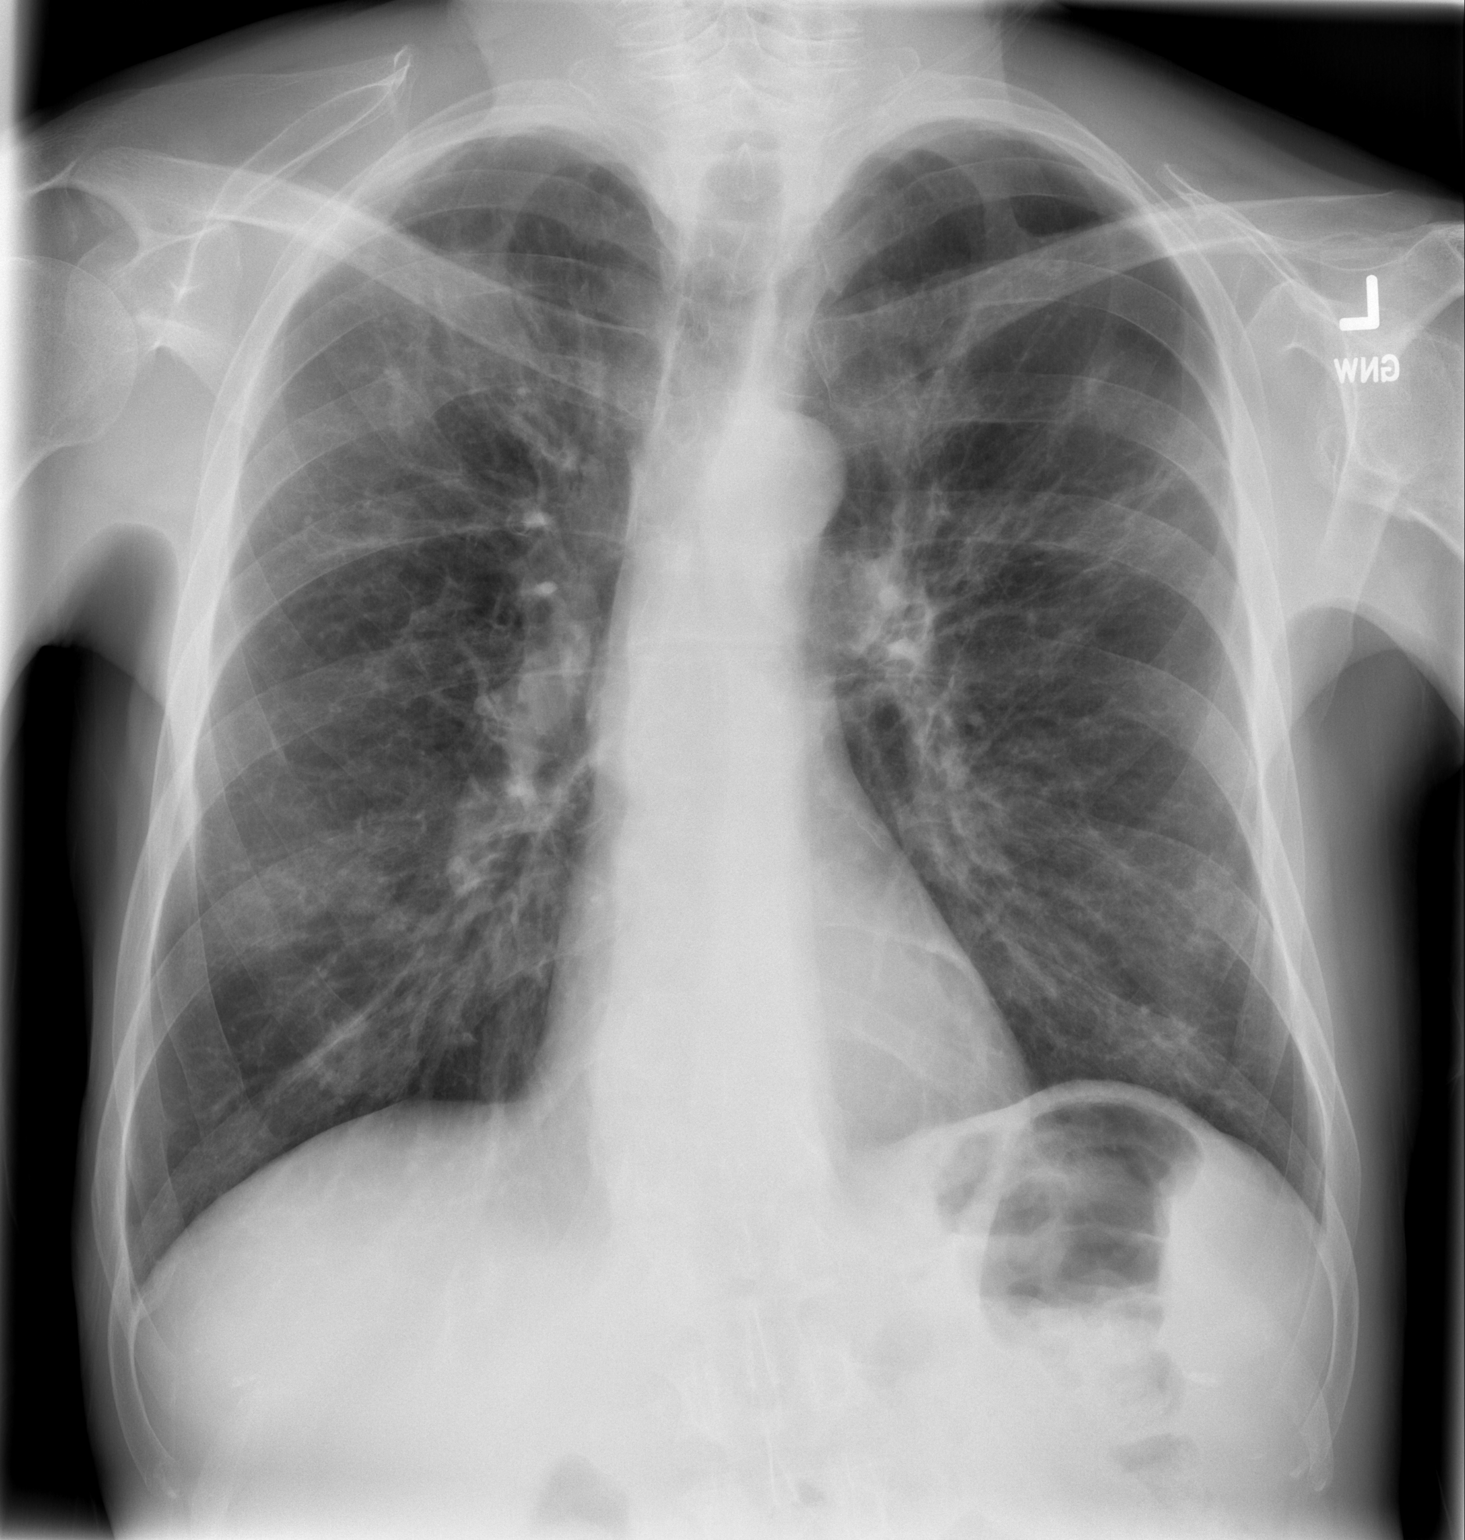

[w chest lat]
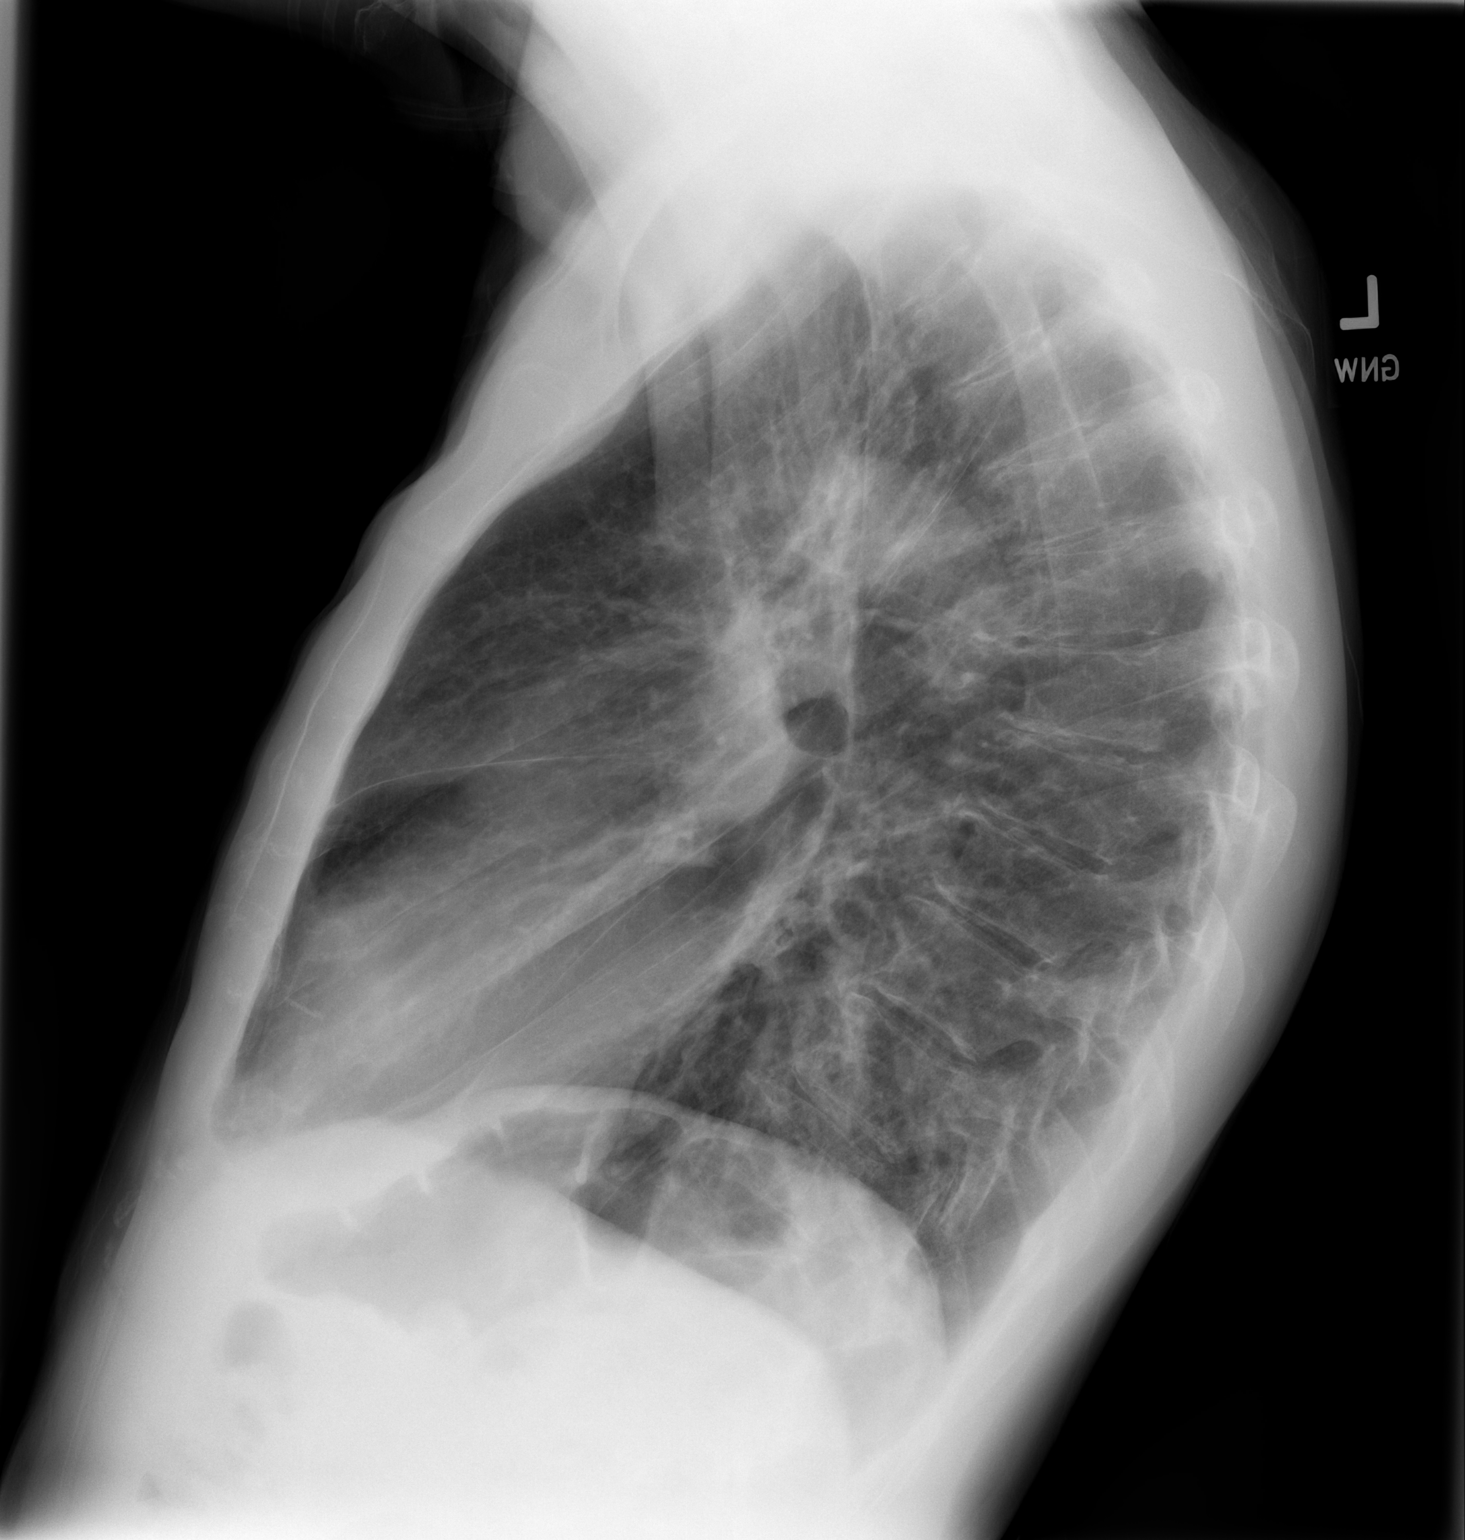

[2 of 2 positions shown; findings below may reference images not displayed]

FINDINGS: Coarse interstitial markings in the suprahilar regions
and lung bases are stable.  No new infiltrate.  No effusion.  Heart
size remains normal.  Regional bones unremarkable.
IMPRESSION: Chronic parenchymal changes.  No acute disease.

## 2014-03-01 IMAGING — CR DG CHEST 2V
2 series · 2 of 2 positions shown · non-contrast
Comparison: None.

CLINICAL DATA: Chest pain and shortness of breath.

CHEST - 2 VIEW

[w chest lat]
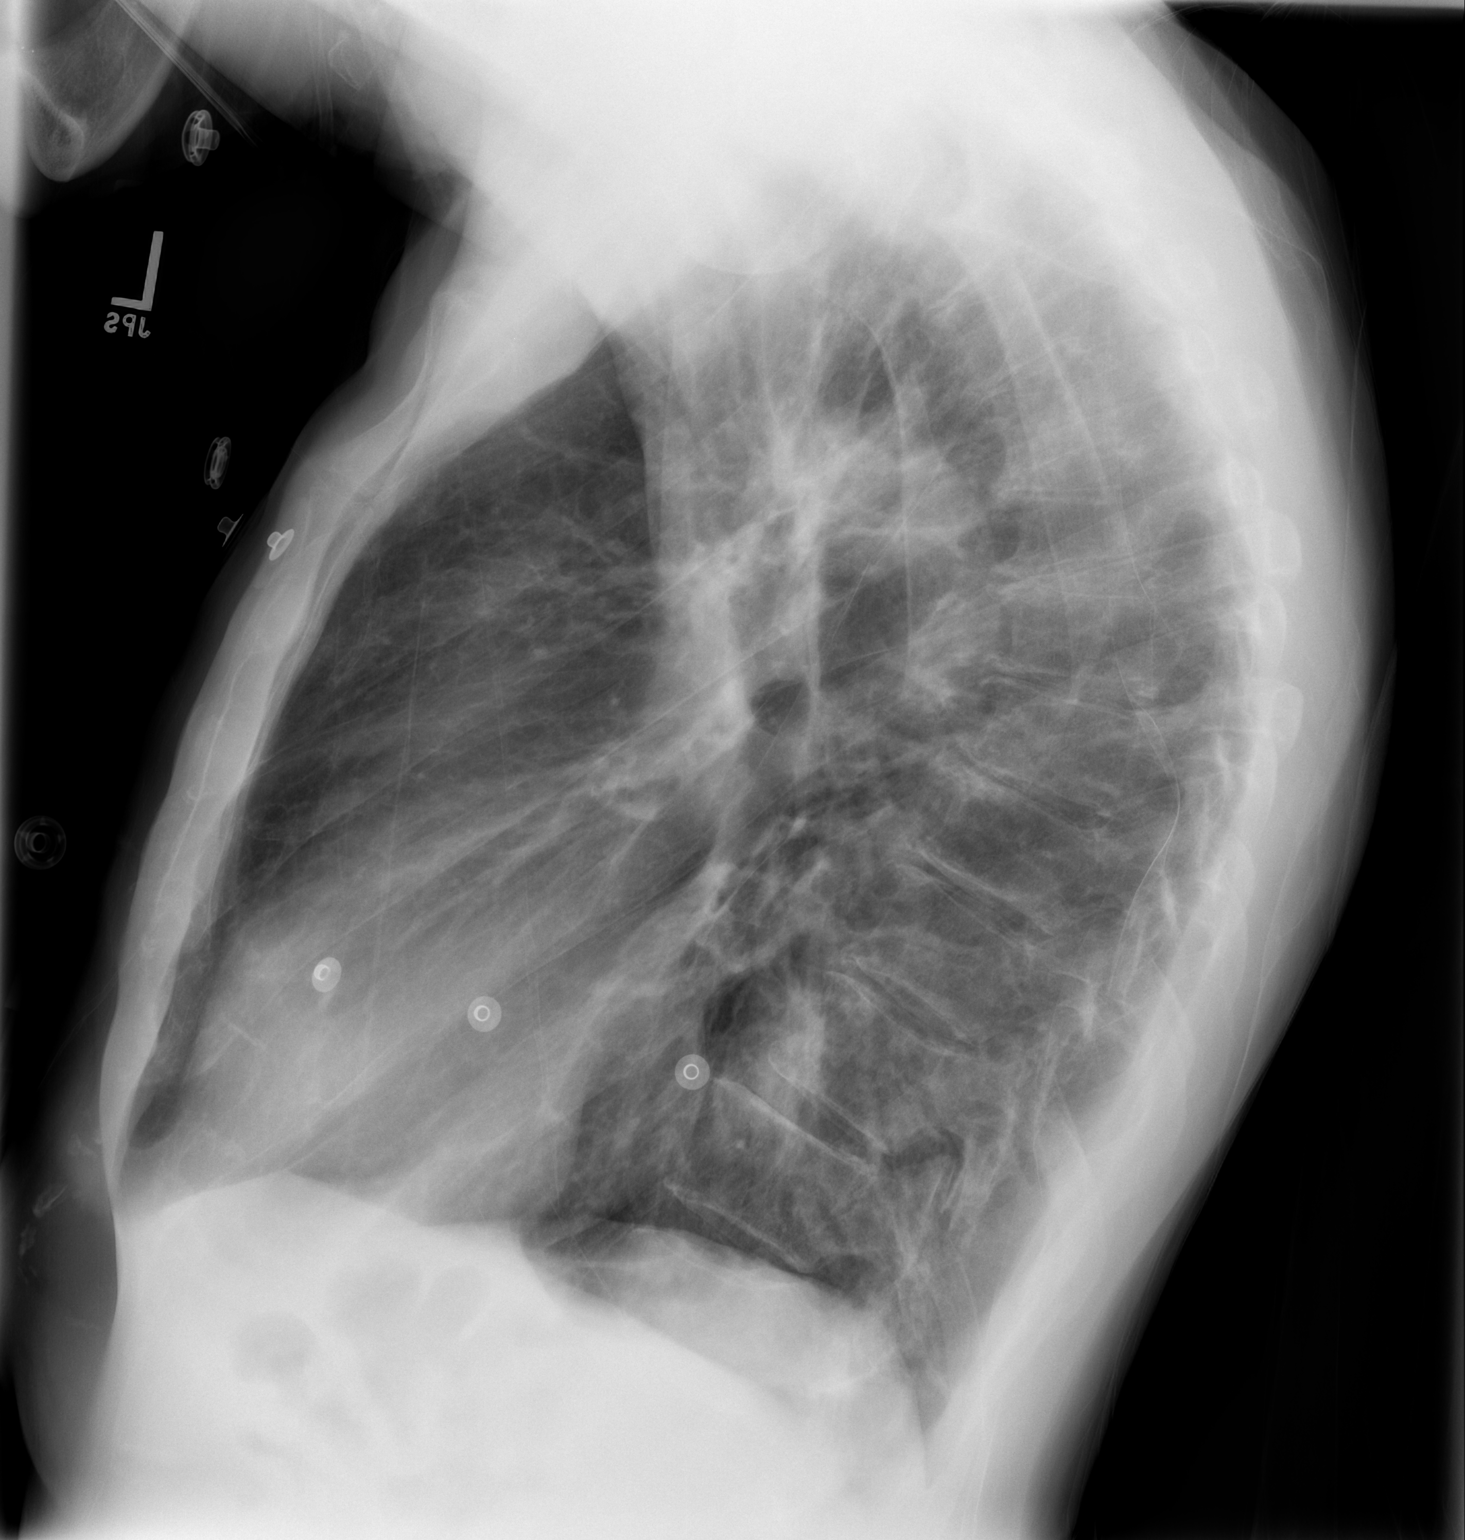

[w chest pa]
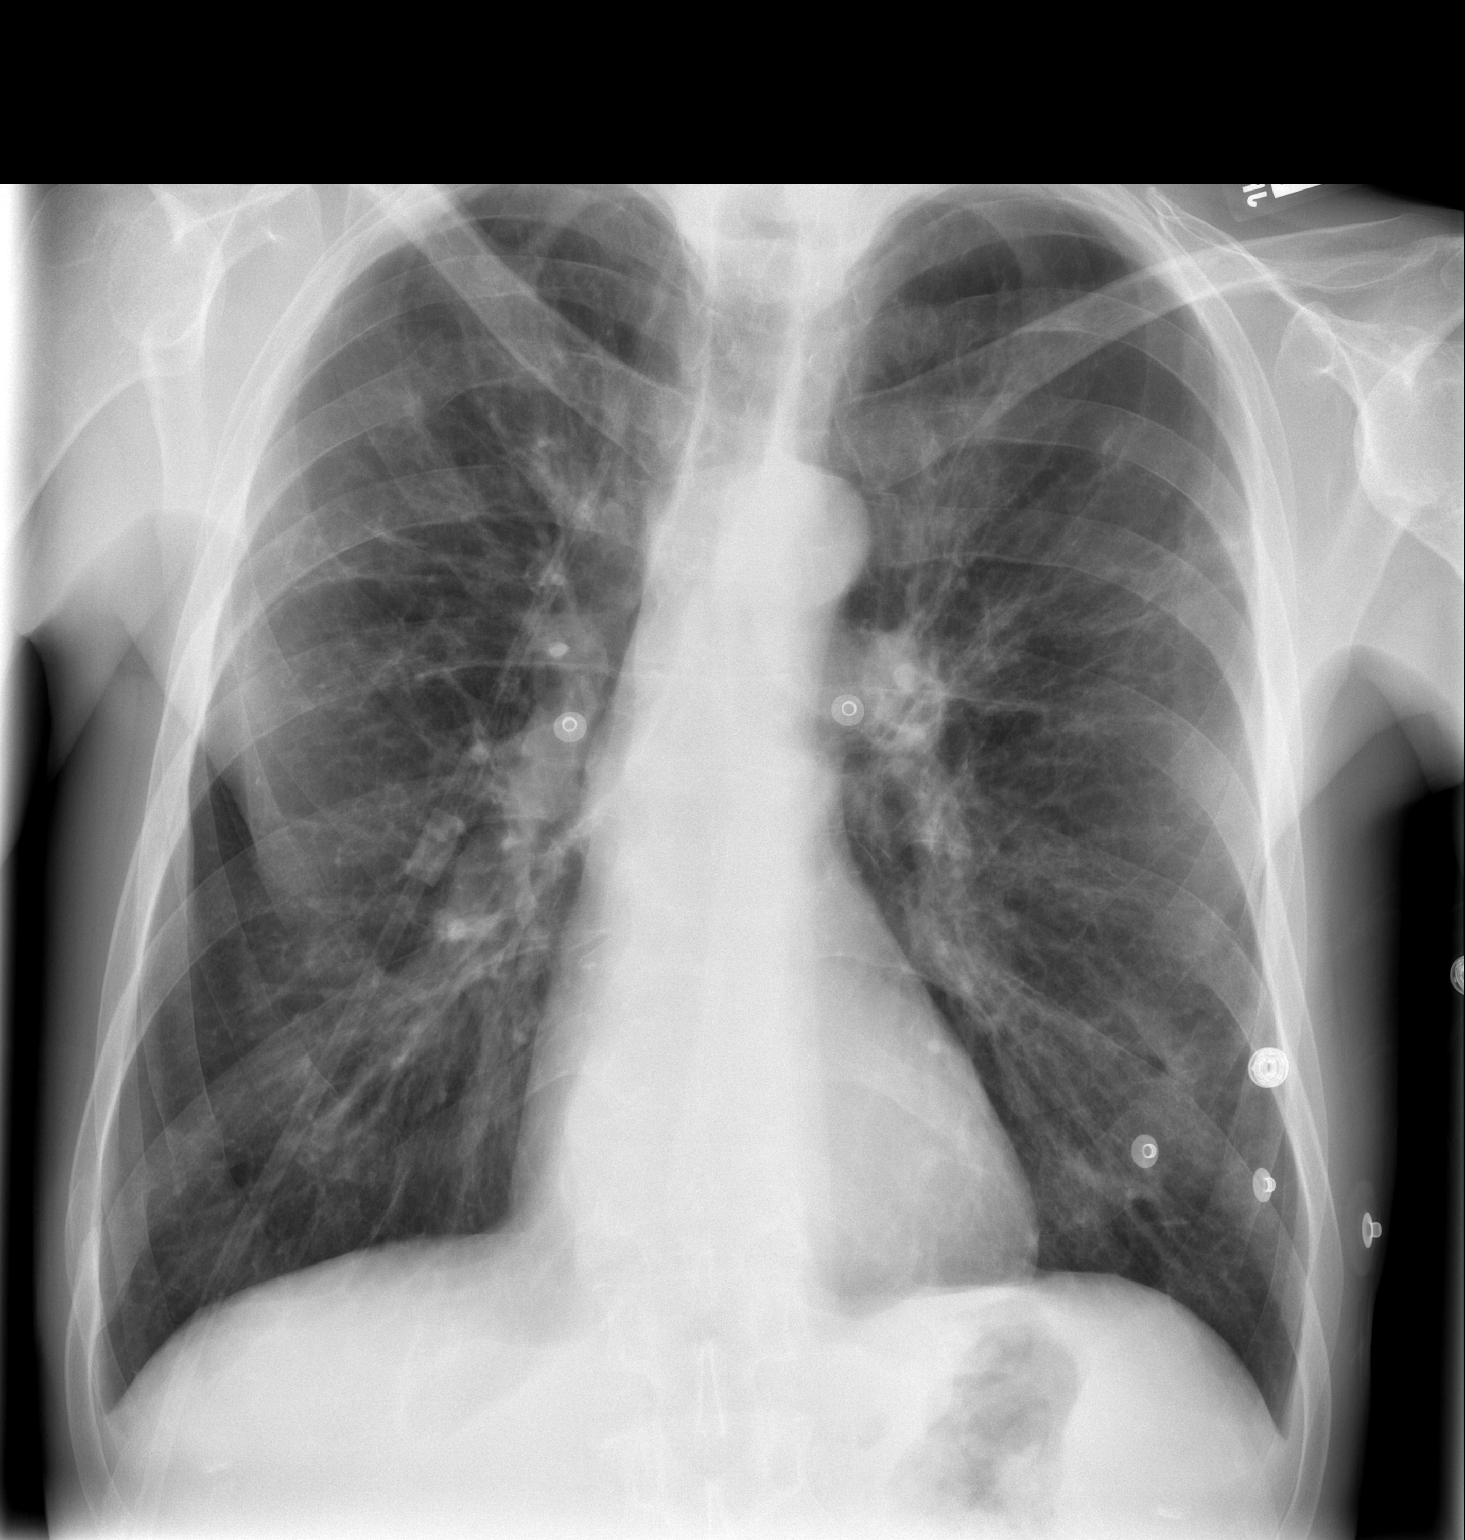

[2 of 2 positions shown; findings below may reference images not displayed]

FINDINGS: Normal heart size and pulmonary vascularity.
Emphysematous changes and scattered fibrosis in the lungs.  No
focal airspace consolidation.  No blunting of costophrenic angles.
No pneumothorax.  Old bilateral rib fractures.
IMPRESSION: Emphysema with scattered fibrosis consistent with chronic
bronchitic changes.  Old rib fractures.  No focal consolidation.

## 2014-04-25 NOTE — Consult Note (Signed)
    Assessment    Name: Younes Degeorge Evaluation: 01-10-11 Test(s) Administered: Wechsler Adult Intelligence Scale- 4th Edition (WAIS-IV) Age: 59  Reason for Referral: Mr. Nghiem was referred for a psychological assessment by his physician, Christoper Allegra, MD.  He was admitted to [facility] for the treatment of homicidal ideation. Please see the history and physical and psychosocial history for further background information. As Mr. Bingman has been hospitalized for medical and psychiatric reasons for most of the last 3 months he lost his CAP services. An assessment of intellectual functioning was requested to update his records so that services could be reinstated.  ValidityLloyd was pleasant and cooperative with the testing process. He attempted all tasks requested of his and appeared to try his best. The present evaluation is considered a valid indication of current functioning. of Testing:Abe completed 10 subtests of the WAIS-IV. His Full Scale IQ sore is 52 (90% confidence interval 50-56). His general cognitive ability is in the extremely low range. His overall thinking and reasoning abilities exceed those of only approximately 0.1% of individuals his age. His ability to reason with words is comparable to his ability to reason without words. He performed better on nonverbal than on verbal reasoning tasks. Such differences in performance are not unusual. Gopaul?s verbal reasoning abilities fall in the extremely low range and above those of only 0.2% of his peers (VCI = 56; 90% confidence interval =53-62). His performance on the verbal subtests is somewhat variable but not particularly unusual. Lloyds nonverbal reasoning abilities fall in the extremely low range and above those of only 1% of his peers (PRI=67; 90% confidence interval = 63-74). The perceptual reasoning scale is designed to measure fluid reasoning in the perceptual domain with tasks that assess nonverbal concept formation,  visual perceptions and organization, visual motor coordination, learning and the ability to separate figure and ground in visually presented material. He performed similarly on all the subtests suggesting that his visual-spatial reasoning and perceptual-organizational skills are similarly developed. Irving?s ability to sustain attention, concentrate and exert mental control is in the extremely low range. He performed better than approximately 0.3% of his peers in this area (Working Memory Index =58; 90% confidence interval =55-66).  He had difficulty with the two tasks that make up this index, that is, attending and holding information in short term memory while performing some operation or manipulation with it and then correctly producing the transformed information. This general weakness in attention, concentration, mental control and short term auditory memory may impede Mr. Kober performance in a variety of areas including verbal comprehension and numerical processing. 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 Antony Contras, Psy.D.Psychologist, HSPP    Electronic Signatures: Garald Braver (PsyD, HSP-P)  (Signed on 09-Jan-13 14:07)  Authored  Last Updated: 09-Jan-13 14:07 by Garald Braver (PsyD, HSP-P)

## 2014-04-25 NOTE — H&P (Signed)
PATIENT NAME:  [name], [name] MR#:  [id number] DATE OF BIRTH:  [birth year]  DATE OF ADMISSION:  12/27/2010  REFERRING PHYSICIAN: Dr. Renee Ramus PRIMARY CARE PHYSICIAN: Dr. Lovie Macadamia  PRESENTING COMPLAINT: Decreased level of consciousness and hypoxia.   HISTORY OF PRESENT ILLNESS: [name] is a 59 year old gentleman with history of schizophrenia/schizoaffective disorder, cerebral palsy with mental retardation, hypothyroidism, chronic obstructive pulmonary disease and ongoing tobacco use, dysphagia, who presents from [facility] with reports of decreased level of consciousness and altered mentation from baseline as well as hypoxia and noted oxygen sats of 86% on room air. On arrival he was found to be 88% on room air. Patient currently is lethargic and not waking up except to deep sternal rub and unable to communicate his history. According to the nursing staff patient was initially like that prior to arrival and when he arrived was intermittently up and talking. Of note, in October 2012 patient was admitted to [facility] and treated for bilateral pneumonia. He was discharged to [facility] for rehab. From rehab he was then discharged to a group home. Patient then presented here from 11/01 to 11/27 for treatment of psychosis and mood lability in the Behavioral Medicine unit. Internal medicine followed patient initially for elevated ammonia level and then for atypical chest pain. Since discharge to the group home patient represented to [facility] with complaints of chest pain and in the interim had EMS evaluate patient for chest pain and passing out spells. When he was seen at [facility] he had another repeat CT chest that showed improving bilateral airspace opacities and nonspecific mediastinal and hilar adenopathy. This scan was performed on 12/05/2010. He was then discharged to rehab facility at [facility]. From there patient returned here to [facility] on 12/08/2010 also for evaluation of atypical chest  pain and again discharged back to [facility]. He represents today with the above issues. Also of note on 11/25 when he was admitted to the psych unit a CT chest obtained showed multifocal areas of abnormalities superimposed on chronic obstructive pulmonary disease. Correlate for infectious versus inflammatory process or developing malignancy. It was noted that patient was to follow up with Dr. Raul Del on 12/04/2010 but in review of the [facility] records not able to find documentation of visit.   PAST MEDICAL HISTORY:  1. Admitted October 2012 for treatment of bilateral pneumonia thought to be probable aspiration.  2. Admitted 11/01 to 11/28/2010 with management of severe psychosis and mood lability.  3. Benign prostatic hypertrophy and urinary incontinence.  4. Chronic obstructive pulmonary disease with ongoing tobacco use.  5. Dysphagia.  6. Hypertension.  7. Gastroesophageal reflux disease.  8. Atypical chest pain.  9. Gait instability.  10. Normocytic anemia.   PAST SURGICAL HISTORY: None.   ALLERGIES: Chlorpromazine, fluphenazine, Prolixin, Thorazine, lactose, peanuts.   MEDICATIONS:  1. Amlodipine 10 mg daily.  2. Docusate liquid 50 mg/5 mL take 50 mg daily.  3. Lisinopril 5 mg daily.  4. Potassium chloride 20 mEq daily.  5. Risperidone 1 mg q.a.m.  6. Tamsulosin 0.4 mg daily.  7. Risperidone 2 mg at bedtime.  8. Temazepam 30 mg at bedtime.  9. Trazodone 100 mg at bedtime.  10. Tylenol 650 mg every four hours as needed.  11. Combivent 2 puffs q.i.d.  12. Combivent 2 puffs b.i.d. q.6 hours as needed.  13. Augmentin 875 mg for 10 days; patient has two more days to complete.  14. Ativan 1.5 mg p.o. every eight hours as needed  for agitation and anxiety.  15. Med Pass 90 mL p.o. t.i.d.  16. Albuterol nebulizer every six hours as needed.  17. Diphenhydramine 50 mg 1 tablet every six hours as needed.  18. EpiPen as needed.  19. Depakote 250 mg b.i.d.  20. Levothyroxine  75 mcg daily.  21. Pureed nectar thick liquids.  22. Omeprazole 20 mg b.i.d.   REVIEW OF SYSTEMS: Unable to obtain due to patient's lethargy.   SOCIAL HISTORY: Patient was previously living in a group home with legal guardian but now is at a rehab facility at [facility]. He continues to smoke cigarettes. No records of alcohol or drug abuse.   FAMILY HISTORY: Per records mother having phlebitis.   PHYSICAL EXAMINATION:  VITAL SIGNS: Temperature 97.9, pulse 70, respiratory rate 20, blood pressure 113/75, currently 95% on Ventimask.   GENERAL: Lying in bed with increased somnolence.   HEENT: Normocephalic, atraumatic. Pupils are dilated, nonicteric. Ventimask in place. Dry mucous membrane.   NECK: Soft and supple. No adenopathy or JVP.   CARDIOVASCULAR: Non-tachy. No murmurs, rubs, or gallops.   LUNGS: Faint expiratory wheezing. No use of accessory muscles or increased respiratory effort.   ABDOMEN: Soft. Positive bowel sounds. No mass appreciated.   EXTREMITIES: No edema. Dorsal pedis pulses intact.   MUSCULOSKELETAL: No joint effusion.   SKIN: Unable to appreciate ulcers.   NEUROLOGIC: Patient not cooperating with neurological exam.   PSYCH: He is not alert, only awake to deep sternal rub.   LABORATORY, DIAGNOSTIC AND RADIOLOGICAL DATA: Lithium level less than 0.2. WBC 5.7, hemoglobin 11.4, hematocrit 34.6, platelet 211, MCV 85, glucose 98, BUN 4, creatinine 0.61, sodium 149, potassium 3.7, chloride 107, carbon dioxide 32, calcium 8.9, total bilirubin 0.2, alkaline phosphatase 81, ALT 11, AST 12. Troponin less than 0.02. Chest x-ray with small left pleural effusion, questionable infiltrate.   ASSESSMENT AND PLAN: [name] is a 59 year old gentleman with history of cerebral palsy, MR, chronic obstructive pulmonary disease and ongoing tobacco use, dysphagia, schizophrenia, schizoaffective disorder presenting with altered mental status and hypoxia.  1. Altered mental  status/acute hypoxic respiratory failure. Questionable recurrent pneumonia with recurrent aspiration and chronic obstructive pulmonary disease exacerbation with metabolic encephalopathy. As above detailing his admission to [facility] in October 2012 with ongoing abnormal CT scan findings, possibly having recurrent episodes of aspiration resulting in these bilateral abnormalities and adenopathy. He was supposed to follow up with Dr. Raul Del but has not done so according to [facility] records. Will have Dr. Raul Del evaluate while here. Continue patient on vancomycin and Zosyn for now given his frequent and long hospitalization since October. Will obtain an ABG given his waxing and waning loss of consciousness to make sure he is not retaining CO2. Will hold off on a repeat CT scan as he has had one most recently and have pulmonary evaluate first. Send sputum culture, blood culture. Continue vancomycin and Zosyn, Solu-Medrol, SVNs, oxygen. Obtain ammonia level. Continue neuro checks. Obtain speech evaluation.   2. Hypothyroidism. Obtain TSH level. Will restart his Synthroid.  3. Tobacco use. Nicotine patch.  4. Schizophrenia. Restart his risperidone, temazepam and Depakote.  5. Prophylaxis with Lovenox and omeprazole.   TIME SPENT: Approximately 50 minutes spent on patient care.   ____________________________ Rita Ohara, MD ap:cms D: 12/27/2010 03:03:16 ET T: 12/27/2010 08:54:36 ET JOB#: [id number]  cc: Brien Few Naif Alabi, MD, <Dictator> Youlanda Roys. Lovie Macadamia, MD Rita Ohara MD ELECTRONICALLY SIGNED 01/20/2011 2:20
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
